# Patient Record
Sex: Female | Born: 1970 | Race: White | Hispanic: No | Smoking: Former smoker
Health system: Southern US, Community
[De-identification: ages and names within clinical notes are randomized; demographics above are authoritative.]

## PROBLEM LIST (undated history)

## (undated) DIAGNOSIS — M549 Dorsalgia, unspecified: Secondary | ICD-10-CM

## (undated) DIAGNOSIS — F32A Depression, unspecified: Secondary | ICD-10-CM

## (undated) DIAGNOSIS — G43909 Migraine, unspecified, not intractable, without status migrainosus: Secondary | ICD-10-CM

## (undated) DIAGNOSIS — F419 Anxiety disorder, unspecified: Secondary | ICD-10-CM

## (undated) DIAGNOSIS — F1011 Alcohol abuse, in remission: Secondary | ICD-10-CM

## (undated) DIAGNOSIS — E785 Hyperlipidemia, unspecified: Secondary | ICD-10-CM

## (undated) DIAGNOSIS — Z87442 Personal history of urinary calculi: Secondary | ICD-10-CM

## (undated) DIAGNOSIS — F1910 Other psychoactive substance abuse, uncomplicated: Secondary | ICD-10-CM

## (undated) DIAGNOSIS — Z8719 Personal history of other diseases of the digestive system: Secondary | ICD-10-CM

## (undated) DIAGNOSIS — T4145XA Adverse effect of unspecified anesthetic, initial encounter: Secondary | ICD-10-CM

## (undated) DIAGNOSIS — K589 Irritable bowel syndrome without diarrhea: Secondary | ICD-10-CM

## (undated) DIAGNOSIS — S83249A Other tear of medial meniscus, current injury, unspecified knee, initial encounter: Secondary | ICD-10-CM

## (undated) DIAGNOSIS — Z9889 Other specified postprocedural states: Secondary | ICD-10-CM

## (undated) DIAGNOSIS — Z98811 Dental restoration status: Secondary | ICD-10-CM

## (undated) DIAGNOSIS — M1990 Unspecified osteoarthritis, unspecified site: Secondary | ICD-10-CM

## (undated) DIAGNOSIS — F1411 Cocaine abuse, in remission: Secondary | ICD-10-CM

## (undated) DIAGNOSIS — G4730 Sleep apnea, unspecified: Secondary | ICD-10-CM

## (undated) DIAGNOSIS — K219 Gastro-esophageal reflux disease without esophagitis: Secondary | ICD-10-CM

## (undated) DIAGNOSIS — M5020 Other cervical disc displacement, unspecified cervical region: Secondary | ICD-10-CM

## (undated) DIAGNOSIS — M5412 Radiculopathy, cervical region: Secondary | ICD-10-CM

## (undated) DIAGNOSIS — A6000 Herpesviral infection of urogenital system, unspecified: Secondary | ICD-10-CM

## (undated) DIAGNOSIS — I10 Essential (primary) hypertension: Secondary | ICD-10-CM

## (undated) DIAGNOSIS — F329 Major depressive disorder, single episode, unspecified: Secondary | ICD-10-CM

## (undated) DIAGNOSIS — S161XXA Strain of muscle, fascia and tendon at neck level, initial encounter: Secondary | ICD-10-CM

## (undated) DIAGNOSIS — G8929 Other chronic pain: Secondary | ICD-10-CM

## (undated) DIAGNOSIS — G5600 Carpal tunnel syndrome, unspecified upper limb: Secondary | ICD-10-CM

## (undated) DIAGNOSIS — Z765 Malingerer [conscious simulation]: Secondary | ICD-10-CM

## (undated) HISTORY — DX: Malingerer (conscious simulation): Z76.5

## (undated) HISTORY — PX: APPENDECTOMY: SHX54

## (undated) HISTORY — PX: ERCP: SHX60

## (undated) HISTORY — DX: Carpal tunnel syndrome, unspecified upper limb: G56.00

## (undated) HISTORY — DX: Major depressive disorder, single episode, unspecified: F32.9

## (undated) HISTORY — DX: Irritable bowel syndrome, unspecified: K58.9

## (undated) HISTORY — DX: Anxiety disorder, unspecified: F41.9

## (undated) HISTORY — DX: Alcohol abuse, in remission: F10.11

## (undated) HISTORY — DX: Depression, unspecified: F32.A

## (undated) HISTORY — DX: Cocaine abuse, in remission: F14.11

## (undated) HISTORY — PX: NASAL SEPTUM SURGERY: SHX37

## (undated) HISTORY — DX: Migraine, unspecified, not intractable, without status migrainosus: G43.909

## (undated) HISTORY — DX: Hyperlipidemia, unspecified: E78.5

## (undated) HISTORY — PX: COLONOSCOPY: SHX174

## (undated) HISTORY — DX: Essential (primary) hypertension: I10

## (undated) HISTORY — PX: UPPER GASTROINTESTINAL ENDOSCOPY: SHX188

## (undated) HISTORY — PX: KNEE ARTHROSCOPY: SUR90

## (undated) HISTORY — DX: Dorsalgia, unspecified: M54.9

## (undated) HISTORY — DX: Other psychoactive substance abuse, uncomplicated: F19.10

## (undated) HISTORY — DX: Radiculopathy, cervical region: M54.12

## (undated) HISTORY — PX: ABDOMINAL HYSTERECTOMY: SHX81

## (undated) HISTORY — DX: Other chronic pain: G89.29

## (undated) HISTORY — DX: Gastro-esophageal reflux disease without esophagitis: K21.9

---

## 1998-04-20 ENCOUNTER — Ambulatory Visit (HOSPITAL_COMMUNITY): Admission: RE | Admit: 1998-04-20 | Discharge: 1998-04-20 | Payer: Self-pay | Admitting: *Deleted

## 1999-05-24 ENCOUNTER — Other Ambulatory Visit: Admission: RE | Admit: 1999-05-24 | Discharge: 1999-05-24 | Payer: Self-pay | Admitting: Gynecology

## 1999-05-26 ENCOUNTER — Encounter (INDEPENDENT_AMBULATORY_CARE_PROVIDER_SITE_OTHER): Payer: Self-pay | Admitting: Specialist

## 1999-05-26 ENCOUNTER — Inpatient Hospital Stay (HOSPITAL_COMMUNITY): Admission: RE | Admit: 1999-05-26 | Discharge: 1999-05-27 | Payer: Self-pay | Admitting: Gynecology

## 2000-09-02 ENCOUNTER — Emergency Department (HOSPITAL_COMMUNITY): Admission: EM | Admit: 2000-09-02 | Discharge: 2000-09-02 | Payer: Self-pay | Admitting: Emergency Medicine

## 2001-05-01 ENCOUNTER — Ambulatory Visit (HOSPITAL_COMMUNITY): Admission: RE | Admit: 2001-05-01 | Discharge: 2001-05-01 | Payer: Self-pay | Admitting: Family Medicine

## 2001-05-01 ENCOUNTER — Encounter: Payer: Self-pay | Admitting: Family Medicine

## 2002-08-04 ENCOUNTER — Emergency Department (HOSPITAL_COMMUNITY): Admission: EM | Admit: 2002-08-04 | Discharge: 2002-08-04 | Payer: Self-pay | Admitting: Emergency Medicine

## 2002-08-25 ENCOUNTER — Emergency Department (HOSPITAL_COMMUNITY): Admission: EM | Admit: 2002-08-25 | Discharge: 2002-08-25 | Payer: Self-pay | Admitting: *Deleted

## 2002-12-22 ENCOUNTER — Emergency Department (HOSPITAL_COMMUNITY): Admission: EM | Admit: 2002-12-22 | Discharge: 2002-12-22 | Payer: Self-pay | Admitting: Emergency Medicine

## 2002-12-22 ENCOUNTER — Encounter: Payer: Self-pay | Admitting: Emergency Medicine

## 2003-02-02 ENCOUNTER — Emergency Department (HOSPITAL_COMMUNITY): Admission: EM | Admit: 2003-02-02 | Discharge: 2003-02-02 | Payer: Self-pay | Admitting: Emergency Medicine

## 2003-02-12 ENCOUNTER — Emergency Department (HOSPITAL_COMMUNITY): Admission: EM | Admit: 2003-02-12 | Discharge: 2003-02-12 | Payer: Self-pay | Admitting: Emergency Medicine

## 2003-02-20 ENCOUNTER — Emergency Department (HOSPITAL_COMMUNITY): Admission: EM | Admit: 2003-02-20 | Discharge: 2003-02-20 | Payer: Self-pay | Admitting: *Deleted

## 2003-03-03 ENCOUNTER — Emergency Department (HOSPITAL_COMMUNITY): Admission: EM | Admit: 2003-03-03 | Discharge: 2003-03-03 | Payer: Self-pay | Admitting: Emergency Medicine

## 2003-03-05 ENCOUNTER — Emergency Department (HOSPITAL_COMMUNITY): Admission: EM | Admit: 2003-03-05 | Discharge: 2003-03-05 | Payer: Self-pay | Admitting: Emergency Medicine

## 2003-08-10 ENCOUNTER — Emergency Department (HOSPITAL_COMMUNITY): Admission: EM | Admit: 2003-08-10 | Discharge: 2003-08-10 | Payer: Self-pay | Admitting: Emergency Medicine

## 2003-09-05 ENCOUNTER — Emergency Department (HOSPITAL_COMMUNITY): Admission: EM | Admit: 2003-09-05 | Discharge: 2003-09-06 | Payer: Self-pay | Admitting: Emergency Medicine

## 2003-09-20 ENCOUNTER — Emergency Department (HOSPITAL_COMMUNITY): Admission: EM | Admit: 2003-09-20 | Discharge: 2003-09-20 | Payer: Self-pay | Admitting: Emergency Medicine

## 2003-10-04 ENCOUNTER — Emergency Department (HOSPITAL_COMMUNITY): Admission: EM | Admit: 2003-10-04 | Discharge: 2003-10-04 | Payer: Self-pay | Admitting: Emergency Medicine

## 2003-10-20 ENCOUNTER — Emergency Department (HOSPITAL_COMMUNITY): Admission: EM | Admit: 2003-10-20 | Discharge: 2003-10-21 | Payer: Self-pay | Admitting: Emergency Medicine

## 2003-12-25 ENCOUNTER — Emergency Department (HOSPITAL_COMMUNITY): Admission: EM | Admit: 2003-12-25 | Discharge: 2003-12-25 | Payer: Self-pay | Admitting: Emergency Medicine

## 2004-02-07 ENCOUNTER — Emergency Department (HOSPITAL_COMMUNITY): Admission: EM | Admit: 2004-02-07 | Discharge: 2004-02-07 | Payer: Self-pay | Admitting: Emergency Medicine

## 2004-03-23 ENCOUNTER — Emergency Department (HOSPITAL_COMMUNITY): Admission: EM | Admit: 2004-03-23 | Discharge: 2004-03-23 | Payer: Self-pay | Admitting: *Deleted

## 2004-03-25 ENCOUNTER — Emergency Department (HOSPITAL_COMMUNITY): Admission: EM | Admit: 2004-03-25 | Discharge: 2004-03-25 | Payer: Self-pay | Admitting: Emergency Medicine

## 2004-04-28 ENCOUNTER — Emergency Department (HOSPITAL_COMMUNITY): Admission: EM | Admit: 2004-04-28 | Discharge: 2004-04-28 | Payer: Self-pay | Admitting: Emergency Medicine

## 2004-05-26 ENCOUNTER — Emergency Department (HOSPITAL_COMMUNITY): Admission: EM | Admit: 2004-05-26 | Discharge: 2004-05-27 | Payer: Self-pay | Admitting: Emergency Medicine

## 2004-06-06 ENCOUNTER — Emergency Department (HOSPITAL_COMMUNITY): Admission: EM | Admit: 2004-06-06 | Discharge: 2004-06-06 | Payer: Self-pay | Admitting: Pediatrics

## 2004-06-19 ENCOUNTER — Emergency Department (HOSPITAL_COMMUNITY): Admission: EM | Admit: 2004-06-19 | Discharge: 2004-06-20 | Payer: Self-pay | Admitting: Emergency Medicine

## 2004-07-27 ENCOUNTER — Emergency Department (HOSPITAL_COMMUNITY): Admission: EM | Admit: 2004-07-27 | Discharge: 2004-07-27 | Payer: Self-pay | Admitting: Emergency Medicine

## 2004-07-30 ENCOUNTER — Emergency Department (HOSPITAL_COMMUNITY): Admission: EM | Admit: 2004-07-30 | Discharge: 2004-07-31 | Payer: Self-pay | Admitting: Emergency Medicine

## 2004-08-15 ENCOUNTER — Emergency Department (HOSPITAL_COMMUNITY): Admission: EM | Admit: 2004-08-15 | Discharge: 2004-08-15 | Payer: Self-pay | Admitting: Emergency Medicine

## 2004-12-19 ENCOUNTER — Emergency Department (HOSPITAL_COMMUNITY): Admission: EM | Admit: 2004-12-19 | Discharge: 2004-12-19 | Payer: Self-pay | Admitting: Emergency Medicine

## 2004-12-20 ENCOUNTER — Emergency Department (HOSPITAL_COMMUNITY): Admission: EM | Admit: 2004-12-20 | Discharge: 2004-12-20 | Payer: Self-pay | Admitting: *Deleted

## 2005-01-06 ENCOUNTER — Emergency Department (HOSPITAL_COMMUNITY): Admission: EM | Admit: 2005-01-06 | Discharge: 2005-01-07 | Payer: Self-pay | Admitting: Emergency Medicine

## 2005-01-27 ENCOUNTER — Emergency Department (HOSPITAL_COMMUNITY): Admission: EM | Admit: 2005-01-27 | Discharge: 2005-01-28 | Payer: Self-pay | Admitting: Emergency Medicine

## 2005-02-09 ENCOUNTER — Emergency Department (HOSPITAL_COMMUNITY): Admission: EM | Admit: 2005-02-09 | Discharge: 2005-02-09 | Payer: Self-pay | Admitting: Emergency Medicine

## 2005-04-03 ENCOUNTER — Emergency Department (HOSPITAL_COMMUNITY): Admission: EM | Admit: 2005-04-03 | Discharge: 2005-04-03 | Payer: Self-pay | Admitting: Emergency Medicine

## 2005-04-04 ENCOUNTER — Ambulatory Visit: Payer: Self-pay | Admitting: Gastroenterology

## 2005-04-05 ENCOUNTER — Ambulatory Visit: Payer: Self-pay | Admitting: Gastroenterology

## 2005-04-07 ENCOUNTER — Ambulatory Visit (HOSPITAL_COMMUNITY): Admission: RE | Admit: 2005-04-07 | Discharge: 2005-04-07 | Payer: Self-pay | Admitting: Gastroenterology

## 2005-04-18 ENCOUNTER — Ambulatory Visit (HOSPITAL_COMMUNITY): Admission: RE | Admit: 2005-04-18 | Discharge: 2005-04-18 | Payer: Self-pay | Admitting: Gastroenterology

## 2005-04-29 ENCOUNTER — Emergency Department (HOSPITAL_COMMUNITY): Admission: EM | Admit: 2005-04-29 | Discharge: 2005-04-29 | Payer: Self-pay | Admitting: Emergency Medicine

## 2005-05-22 ENCOUNTER — Emergency Department (HOSPITAL_COMMUNITY): Admission: EM | Admit: 2005-05-22 | Discharge: 2005-05-22 | Payer: Self-pay | Admitting: Emergency Medicine

## 2005-05-29 ENCOUNTER — Emergency Department (HOSPITAL_COMMUNITY): Admission: EM | Admit: 2005-05-29 | Discharge: 2005-05-30 | Payer: Self-pay | Admitting: Emergency Medicine

## 2005-06-08 ENCOUNTER — Emergency Department (HOSPITAL_COMMUNITY): Admission: EM | Admit: 2005-06-08 | Discharge: 2005-06-09 | Payer: Self-pay | Admitting: Emergency Medicine

## 2005-07-15 ENCOUNTER — Inpatient Hospital Stay (HOSPITAL_COMMUNITY): Admission: EM | Admit: 2005-07-15 | Discharge: 2005-07-19 | Payer: Self-pay | Admitting: Psychiatry

## 2005-07-16 ENCOUNTER — Ambulatory Visit: Payer: Self-pay | Admitting: Psychiatry

## 2005-07-30 ENCOUNTER — Emergency Department (HOSPITAL_COMMUNITY): Admission: EM | Admit: 2005-07-30 | Discharge: 2005-07-30 | Payer: Self-pay | Admitting: Emergency Medicine

## 2005-08-12 ENCOUNTER — Emergency Department (HOSPITAL_COMMUNITY): Admission: EM | Admit: 2005-08-12 | Discharge: 2005-08-12 | Payer: Self-pay | Admitting: Emergency Medicine

## 2005-08-15 ENCOUNTER — Encounter (HOSPITAL_COMMUNITY): Admission: RE | Admit: 2005-08-15 | Discharge: 2005-11-13 | Payer: Self-pay | Admitting: Emergency Medicine

## 2005-08-19 ENCOUNTER — Emergency Department (HOSPITAL_COMMUNITY): Admission: EM | Admit: 2005-08-19 | Discharge: 2005-08-19 | Payer: Self-pay | Admitting: Emergency Medicine

## 2005-08-24 ENCOUNTER — Emergency Department (HOSPITAL_COMMUNITY): Admission: EM | Admit: 2005-08-24 | Discharge: 2005-08-25 | Payer: Self-pay | Admitting: Emergency Medicine

## 2005-08-26 ENCOUNTER — Emergency Department (HOSPITAL_COMMUNITY): Admission: EM | Admit: 2005-08-26 | Discharge: 2005-08-26 | Payer: Self-pay | Admitting: *Deleted

## 2005-09-01 ENCOUNTER — Inpatient Hospital Stay (HOSPITAL_COMMUNITY): Admission: EM | Admit: 2005-09-01 | Discharge: 2005-09-06 | Payer: Self-pay

## 2005-09-01 ENCOUNTER — Ambulatory Visit: Payer: Self-pay | Admitting: Family Medicine

## 2005-09-02 ENCOUNTER — Ambulatory Visit: Payer: Self-pay | Admitting: Psychiatry

## 2005-09-06 ENCOUNTER — Ambulatory Visit: Payer: Self-pay | Admitting: Family Medicine

## 2005-09-06 ENCOUNTER — Observation Stay (HOSPITAL_COMMUNITY): Admission: AD | Admit: 2005-09-06 | Discharge: 2005-09-07 | Payer: Self-pay | Admitting: Family Medicine

## 2005-09-12 ENCOUNTER — Emergency Department (HOSPITAL_COMMUNITY): Admission: EM | Admit: 2005-09-12 | Discharge: 2005-09-13 | Payer: Self-pay | Admitting: Emergency Medicine

## 2005-09-17 ENCOUNTER — Emergency Department (HOSPITAL_COMMUNITY): Admission: EM | Admit: 2005-09-17 | Discharge: 2005-09-17 | Payer: Self-pay | Admitting: Emergency Medicine

## 2005-09-18 ENCOUNTER — Emergency Department (HOSPITAL_COMMUNITY): Admission: EM | Admit: 2005-09-18 | Discharge: 2005-09-18 | Payer: Self-pay | Admitting: Emergency Medicine

## 2005-09-27 ENCOUNTER — Emergency Department (HOSPITAL_COMMUNITY): Admission: EM | Admit: 2005-09-27 | Discharge: 2005-09-27 | Payer: Self-pay | Admitting: Emergency Medicine

## 2005-10-03 ENCOUNTER — Emergency Department (HOSPITAL_COMMUNITY): Admission: EM | Admit: 2005-10-03 | Discharge: 2005-10-03 | Payer: Self-pay | Admitting: Emergency Medicine

## 2005-10-15 ENCOUNTER — Emergency Department (HOSPITAL_COMMUNITY): Admission: EM | Admit: 2005-10-15 | Discharge: 2005-10-15 | Payer: Self-pay | Admitting: Emergency Medicine

## 2005-10-20 ENCOUNTER — Inpatient Hospital Stay (HOSPITAL_COMMUNITY): Admission: AD | Admit: 2005-10-20 | Discharge: 2005-10-24 | Payer: Self-pay | Admitting: Psychiatry

## 2005-10-21 ENCOUNTER — Ambulatory Visit: Payer: Self-pay | Admitting: Psychiatry

## 2005-10-28 ENCOUNTER — Emergency Department (HOSPITAL_COMMUNITY): Admission: EM | Admit: 2005-10-28 | Discharge: 2005-10-28 | Payer: Self-pay | Admitting: *Deleted

## 2005-11-03 ENCOUNTER — Inpatient Hospital Stay (HOSPITAL_COMMUNITY): Admission: RE | Admit: 2005-11-03 | Discharge: 2005-11-09 | Payer: Self-pay | Admitting: Psychiatry

## 2005-11-06 ENCOUNTER — Ambulatory Visit: Payer: Self-pay | Admitting: Psychiatry

## 2005-11-16 ENCOUNTER — Ambulatory Visit: Payer: Self-pay | Admitting: Gastroenterology

## 2005-11-17 ENCOUNTER — Emergency Department (HOSPITAL_COMMUNITY): Admission: EM | Admit: 2005-11-17 | Discharge: 2005-11-17 | Payer: Self-pay | Admitting: Emergency Medicine

## 2005-11-18 ENCOUNTER — Emergency Department (HOSPITAL_COMMUNITY): Admission: EM | Admit: 2005-11-18 | Discharge: 2005-11-18 | Payer: Self-pay | Admitting: Family Medicine

## 2005-11-18 ENCOUNTER — Emergency Department (HOSPITAL_COMMUNITY): Admission: EM | Admit: 2005-11-18 | Discharge: 2005-11-19 | Payer: Self-pay | Admitting: Emergency Medicine

## 2005-11-22 ENCOUNTER — Ambulatory Visit: Payer: Self-pay | Admitting: Gastroenterology

## 2005-11-27 ENCOUNTER — Ambulatory Visit: Payer: Self-pay | Admitting: Gastroenterology

## 2005-11-28 ENCOUNTER — Encounter: Payer: Self-pay | Admitting: Emergency Medicine

## 2005-11-29 ENCOUNTER — Emergency Department (HOSPITAL_COMMUNITY): Admission: EM | Admit: 2005-11-29 | Discharge: 2005-11-29 | Payer: Self-pay | Admitting: Emergency Medicine

## 2005-12-22 ENCOUNTER — Ambulatory Visit: Payer: Self-pay | Admitting: Gastroenterology

## 2006-01-16 ENCOUNTER — Emergency Department (HOSPITAL_COMMUNITY): Admission: EM | Admit: 2006-01-16 | Discharge: 2006-01-16 | Payer: Self-pay | Admitting: Internal Medicine

## 2006-01-23 ENCOUNTER — Ambulatory Visit: Payer: Self-pay | Admitting: Gastroenterology

## 2006-01-24 ENCOUNTER — Ambulatory Visit: Payer: Self-pay | Admitting: Cardiology

## 2006-02-01 ENCOUNTER — Ambulatory Visit: Payer: Self-pay | Admitting: Gastroenterology

## 2006-02-01 ENCOUNTER — Ambulatory Visit (HOSPITAL_COMMUNITY): Admission: RE | Admit: 2006-02-01 | Discharge: 2006-02-01 | Payer: Self-pay | Admitting: Surgery

## 2006-02-15 ENCOUNTER — Emergency Department (HOSPITAL_COMMUNITY): Admission: EM | Admit: 2006-02-15 | Discharge: 2006-02-15 | Payer: Self-pay | Admitting: Emergency Medicine

## 2006-02-21 ENCOUNTER — Ambulatory Visit (HOSPITAL_COMMUNITY): Admission: RE | Admit: 2006-02-21 | Discharge: 2006-02-22 | Payer: Self-pay | Admitting: Surgery

## 2006-02-21 ENCOUNTER — Encounter (INDEPENDENT_AMBULATORY_CARE_PROVIDER_SITE_OTHER): Payer: Self-pay | Admitting: Specialist

## 2006-02-21 HISTORY — PX: LAPAROSCOPIC LYSIS OF ADHESIONS: SHX5905

## 2006-02-21 HISTORY — PX: LAPAROSCOPIC APPENDECTOMY: SHX408

## 2006-02-25 ENCOUNTER — Emergency Department (HOSPITAL_COMMUNITY): Admission: EM | Admit: 2006-02-25 | Discharge: 2006-02-25 | Payer: Self-pay | Admitting: Emergency Medicine

## 2006-03-06 ENCOUNTER — Emergency Department (HOSPITAL_COMMUNITY): Admission: EM | Admit: 2006-03-06 | Discharge: 2006-03-06 | Payer: Self-pay | Admitting: Emergency Medicine

## 2006-03-10 ENCOUNTER — Emergency Department (HOSPITAL_COMMUNITY): Admission: EM | Admit: 2006-03-10 | Discharge: 2006-03-10 | Payer: Self-pay | Admitting: Family Medicine

## 2006-03-16 ENCOUNTER — Emergency Department (HOSPITAL_COMMUNITY): Admission: EM | Admit: 2006-03-16 | Discharge: 2006-03-17 | Payer: Self-pay | Admitting: Emergency Medicine

## 2006-03-27 ENCOUNTER — Emergency Department (HOSPITAL_COMMUNITY): Admission: EM | Admit: 2006-03-27 | Discharge: 2006-03-27 | Payer: Self-pay | Admitting: Emergency Medicine

## 2006-04-07 ENCOUNTER — Emergency Department (HOSPITAL_COMMUNITY): Admission: EM | Admit: 2006-04-07 | Discharge: 2006-04-07 | Payer: Self-pay | Admitting: Emergency Medicine

## 2006-04-28 ENCOUNTER — Emergency Department (HOSPITAL_COMMUNITY): Admission: EM | Admit: 2006-04-28 | Discharge: 2006-04-28 | Payer: Self-pay | Admitting: Emergency Medicine

## 2006-04-29 ENCOUNTER — Emergency Department (HOSPITAL_COMMUNITY): Admission: EM | Admit: 2006-04-29 | Discharge: 2006-04-29 | Payer: Self-pay | Admitting: Emergency Medicine

## 2006-05-09 ENCOUNTER — Emergency Department (HOSPITAL_COMMUNITY): Admission: EM | Admit: 2006-05-09 | Discharge: 2006-05-09 | Payer: Self-pay | Admitting: Emergency Medicine

## 2006-05-11 ENCOUNTER — Inpatient Hospital Stay (HOSPITAL_COMMUNITY): Admission: RE | Admit: 2006-05-11 | Discharge: 2006-05-17 | Payer: Self-pay | Admitting: Psychiatry

## 2006-05-17 ENCOUNTER — Inpatient Hospital Stay (HOSPITAL_COMMUNITY): Admission: EM | Admit: 2006-05-17 | Discharge: 2006-05-20 | Payer: Self-pay | Admitting: Psychiatry

## 2006-05-18 ENCOUNTER — Ambulatory Visit: Payer: Self-pay | Admitting: Psychiatry

## 2006-05-19 ENCOUNTER — Encounter (HOSPITAL_COMMUNITY): Payer: Self-pay | Admitting: Psychiatry

## 2006-05-28 ENCOUNTER — Inpatient Hospital Stay (HOSPITAL_COMMUNITY): Admission: RE | Admit: 2006-05-28 | Discharge: 2006-06-02 | Payer: Self-pay | Admitting: *Deleted

## 2007-01-03 ENCOUNTER — Emergency Department (HOSPITAL_COMMUNITY): Admission: EM | Admit: 2007-01-03 | Discharge: 2007-01-03 | Payer: Self-pay | Admitting: Emergency Medicine

## 2007-01-24 ENCOUNTER — Ambulatory Visit (HOSPITAL_COMMUNITY): Admission: RE | Admit: 2007-01-24 | Discharge: 2007-01-24 | Payer: Self-pay | Admitting: Gastroenterology

## 2007-01-27 ENCOUNTER — Emergency Department (HOSPITAL_COMMUNITY): Admission: EM | Admit: 2007-01-27 | Discharge: 2007-01-27 | Payer: Self-pay | Admitting: *Deleted

## 2007-01-29 ENCOUNTER — Emergency Department (HOSPITAL_COMMUNITY): Admission: EM | Admit: 2007-01-29 | Discharge: 2007-01-29 | Payer: Self-pay | Admitting: Emergency Medicine

## 2007-02-05 ENCOUNTER — Emergency Department (HOSPITAL_COMMUNITY): Admission: EM | Admit: 2007-02-05 | Discharge: 2007-02-05 | Payer: Self-pay | Admitting: Emergency Medicine

## 2007-02-06 ENCOUNTER — Ambulatory Visit: Payer: Self-pay | Admitting: Gastroenterology

## 2007-02-13 ENCOUNTER — Emergency Department (HOSPITAL_COMMUNITY): Admission: EM | Admit: 2007-02-13 | Discharge: 2007-02-13 | Payer: Self-pay | Admitting: Emergency Medicine

## 2007-03-26 ENCOUNTER — Emergency Department (HOSPITAL_COMMUNITY): Admission: EM | Admit: 2007-03-26 | Discharge: 2007-03-26 | Payer: Self-pay | Admitting: Emergency Medicine

## 2007-04-05 ENCOUNTER — Emergency Department (HOSPITAL_COMMUNITY): Admission: EM | Admit: 2007-04-05 | Discharge: 2007-04-05 | Payer: Self-pay | Admitting: Emergency Medicine

## 2007-04-11 ENCOUNTER — Emergency Department (HOSPITAL_COMMUNITY): Admission: EM | Admit: 2007-04-11 | Discharge: 2007-04-11 | Payer: Self-pay | Admitting: Emergency Medicine

## 2007-05-07 ENCOUNTER — Emergency Department (HOSPITAL_COMMUNITY): Admission: EM | Admit: 2007-05-07 | Discharge: 2007-05-07 | Payer: Self-pay | Admitting: Emergency Medicine

## 2007-06-11 ENCOUNTER — Ambulatory Visit (HOSPITAL_COMMUNITY): Admission: RE | Admit: 2007-06-11 | Discharge: 2007-06-11 | Payer: Self-pay | Admitting: Urology

## 2007-06-11 HISTORY — PX: CYSTOSCOPY W/ URETERAL STENT PLACEMENT: SHX1429

## 2007-06-12 ENCOUNTER — Emergency Department (HOSPITAL_COMMUNITY): Admission: EM | Admit: 2007-06-12 | Discharge: 2007-06-12 | Payer: Self-pay | Admitting: Emergency Medicine

## 2007-09-29 ENCOUNTER — Emergency Department: Payer: Self-pay | Admitting: Emergency Medicine

## 2007-10-17 ENCOUNTER — Emergency Department: Payer: Self-pay | Admitting: Emergency Medicine

## 2007-10-17 ENCOUNTER — Other Ambulatory Visit: Payer: Self-pay

## 2007-10-27 ENCOUNTER — Emergency Department: Payer: Self-pay | Admitting: Emergency Medicine

## 2007-12-21 ENCOUNTER — Emergency Department: Payer: Self-pay | Admitting: Emergency Medicine

## 2007-12-26 ENCOUNTER — Emergency Department: Payer: Self-pay | Admitting: Emergency Medicine

## 2008-01-15 ENCOUNTER — Emergency Department: Payer: Self-pay | Admitting: Unknown Physician Specialty

## 2008-01-31 ENCOUNTER — Emergency Department: Payer: Self-pay | Admitting: Emergency Medicine

## 2008-02-10 ENCOUNTER — Emergency Department: Payer: Self-pay | Admitting: Emergency Medicine

## 2008-02-13 ENCOUNTER — Emergency Department: Payer: Self-pay | Admitting: Emergency Medicine

## 2008-04-15 ENCOUNTER — Emergency Department: Payer: Self-pay | Admitting: Emergency Medicine

## 2008-05-24 ENCOUNTER — Emergency Department: Payer: Self-pay | Admitting: Emergency Medicine

## 2008-07-31 ENCOUNTER — Emergency Department: Payer: Self-pay | Admitting: Internal Medicine

## 2008-08-14 ENCOUNTER — Emergency Department: Payer: Self-pay | Admitting: Emergency Medicine

## 2008-08-22 ENCOUNTER — Encounter: Admission: RE | Admit: 2008-08-22 | Discharge: 2008-08-22 | Payer: Self-pay | Admitting: Orthopedic Surgery

## 2008-09-20 ENCOUNTER — Emergency Department: Payer: Self-pay | Admitting: Emergency Medicine

## 2008-11-20 ENCOUNTER — Emergency Department: Payer: Self-pay | Admitting: Emergency Medicine

## 2009-02-09 ENCOUNTER — Emergency Department (HOSPITAL_COMMUNITY): Admission: EM | Admit: 2009-02-09 | Discharge: 2009-02-09 | Payer: Self-pay | Admitting: Emergency Medicine

## 2009-03-03 ENCOUNTER — Emergency Department (HOSPITAL_COMMUNITY): Admission: EM | Admit: 2009-03-03 | Discharge: 2009-03-03 | Payer: Self-pay | Admitting: Emergency Medicine

## 2009-03-29 ENCOUNTER — Emergency Department (HOSPITAL_COMMUNITY): Admission: EM | Admit: 2009-03-29 | Discharge: 2009-03-29 | Payer: Self-pay | Admitting: Emergency Medicine

## 2009-03-30 ENCOUNTER — Emergency Department: Payer: Self-pay | Admitting: Emergency Medicine

## 2009-04-11 ENCOUNTER — Emergency Department (HOSPITAL_COMMUNITY): Admission: EM | Admit: 2009-04-11 | Discharge: 2009-04-11 | Payer: Self-pay | Admitting: Emergency Medicine

## 2009-04-13 ENCOUNTER — Emergency Department (HOSPITAL_BASED_OUTPATIENT_CLINIC_OR_DEPARTMENT_OTHER): Admission: EM | Admit: 2009-04-13 | Discharge: 2009-04-13 | Payer: Self-pay | Admitting: Emergency Medicine

## 2009-04-18 ENCOUNTER — Emergency Department (HOSPITAL_COMMUNITY): Admission: EM | Admit: 2009-04-18 | Discharge: 2009-04-19 | Payer: Self-pay | Admitting: Emergency Medicine

## 2009-04-20 ENCOUNTER — Emergency Department (HOSPITAL_COMMUNITY): Admission: EM | Admit: 2009-04-20 | Discharge: 2009-04-20 | Payer: Self-pay | Admitting: Emergency Medicine

## 2009-04-20 ENCOUNTER — Emergency Department (HOSPITAL_COMMUNITY): Admission: EM | Admit: 2009-04-20 | Discharge: 2009-04-20 | Payer: Self-pay | Admitting: Family Medicine

## 2009-05-06 ENCOUNTER — Emergency Department: Payer: Self-pay | Admitting: Emergency Medicine

## 2009-05-29 ENCOUNTER — Emergency Department: Payer: Self-pay | Admitting: Emergency Medicine

## 2009-06-02 ENCOUNTER — Ambulatory Visit: Payer: Self-pay | Admitting: Gastroenterology

## 2009-06-04 ENCOUNTER — Emergency Department (HOSPITAL_COMMUNITY): Admission: EM | Admit: 2009-06-04 | Discharge: 2009-06-05 | Payer: Self-pay | Admitting: Emergency Medicine

## 2009-06-09 ENCOUNTER — Emergency Department: Payer: Self-pay | Admitting: Unknown Physician Specialty

## 2009-07-26 ENCOUNTER — Ambulatory Visit (HOSPITAL_COMMUNITY): Admission: RE | Admit: 2009-07-26 | Discharge: 2009-07-27 | Payer: Self-pay | Admitting: Otolaryngology

## 2009-07-26 HISTORY — PX: NASAL SEPTOPLASTY W/ TURBINOPLASTY: SHX2070

## 2009-08-04 ENCOUNTER — Emergency Department (HOSPITAL_COMMUNITY): Admission: EM | Admit: 2009-08-04 | Discharge: 2009-08-05 | Payer: Self-pay | Admitting: Emergency Medicine

## 2009-08-04 ENCOUNTER — Emergency Department (HOSPITAL_COMMUNITY): Admission: EM | Admit: 2009-08-04 | Discharge: 2009-08-04 | Payer: Self-pay | Admitting: Family Medicine

## 2009-09-12 ENCOUNTER — Emergency Department (HOSPITAL_COMMUNITY): Admission: EM | Admit: 2009-09-12 | Discharge: 2009-09-12 | Payer: Self-pay | Admitting: Emergency Medicine

## 2009-09-23 ENCOUNTER — Emergency Department (HOSPITAL_COMMUNITY): Admission: EM | Admit: 2009-09-23 | Discharge: 2009-09-24 | Payer: Self-pay | Admitting: Emergency Medicine

## 2009-10-05 ENCOUNTER — Emergency Department: Payer: Self-pay | Admitting: Internal Medicine

## 2009-10-26 IMAGING — US US PELV - US TRANSVAGINAL
1 series · 17 of 25 positions shown · non-contrast
Comparison: none

REASON FOR EXAM: do not bneed transvaginmal;
COMMENTS:

[Series 1: us pelv - us transvaginal · 17 of 73 slices shown]
[im 1/73]
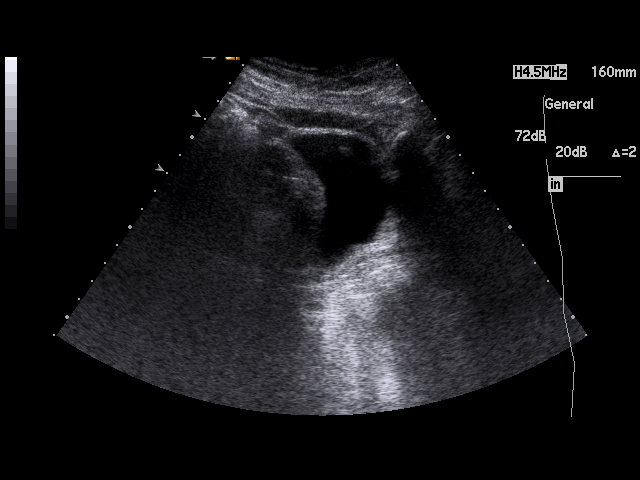
[im 7/73]
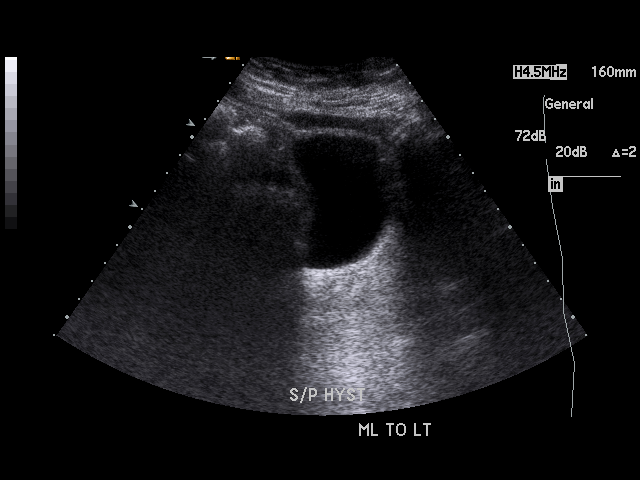
[im 10/73]
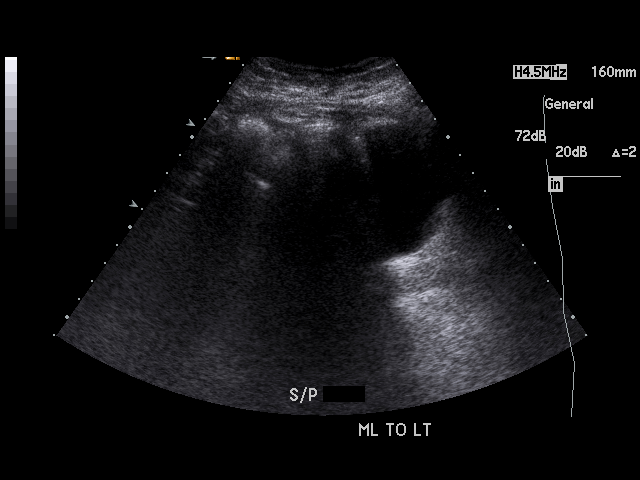
[im 16/73]
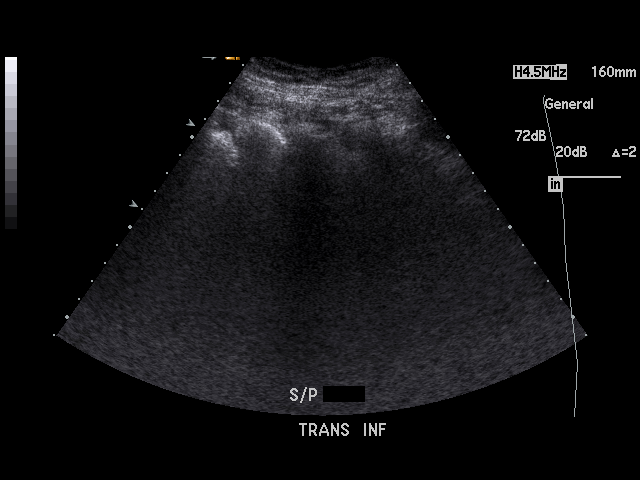
[im 19/73]
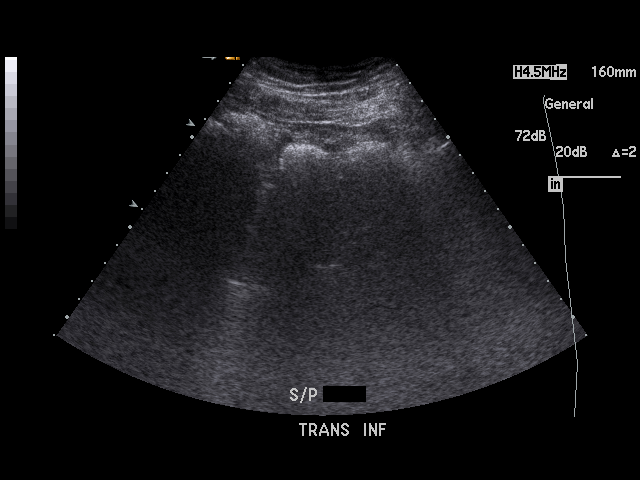
[im 25/73]
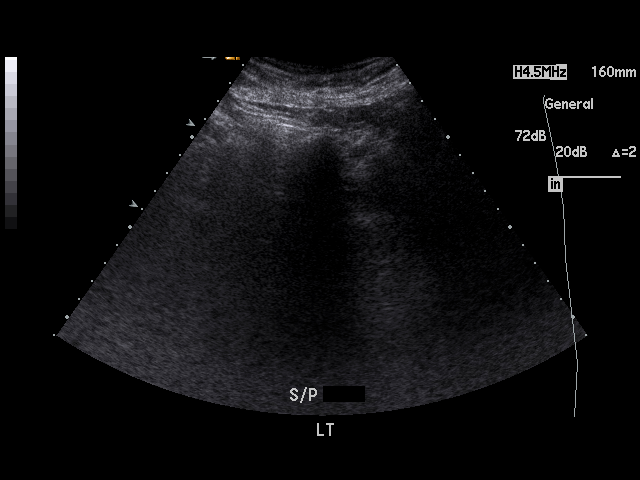
[im 28/73]
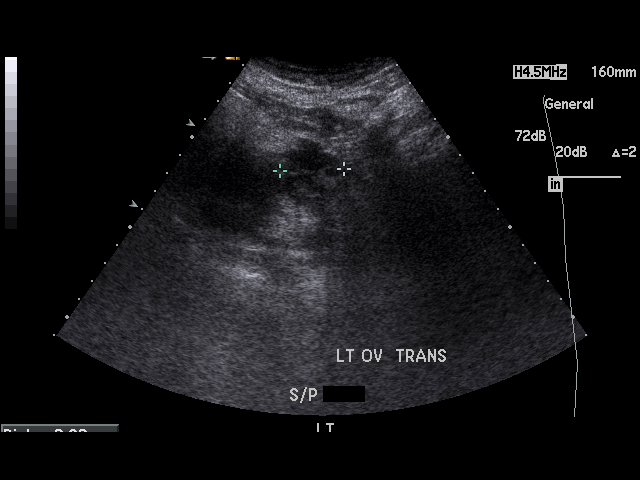
[im 34/73]
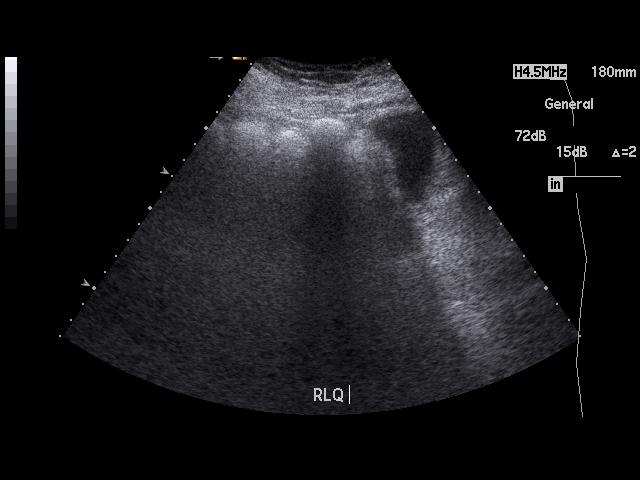
[im 37/73]
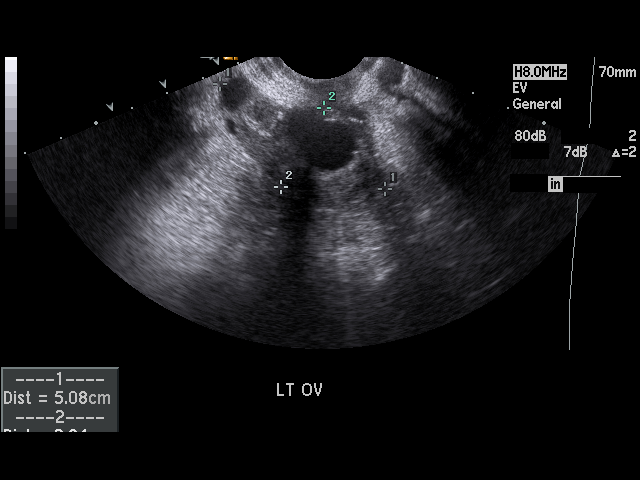
[im 40/73]
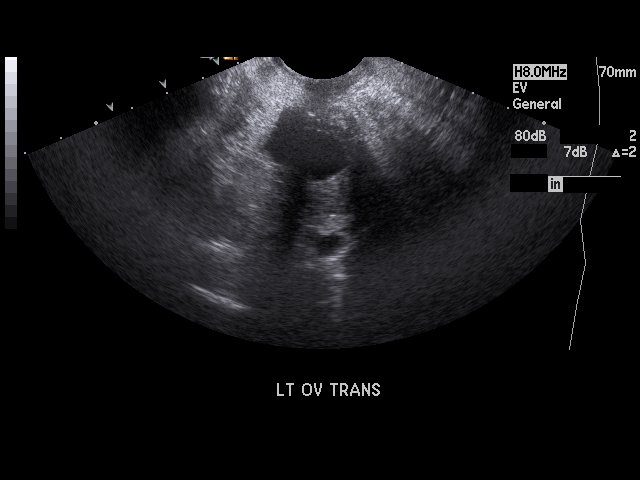
[im 46/73]
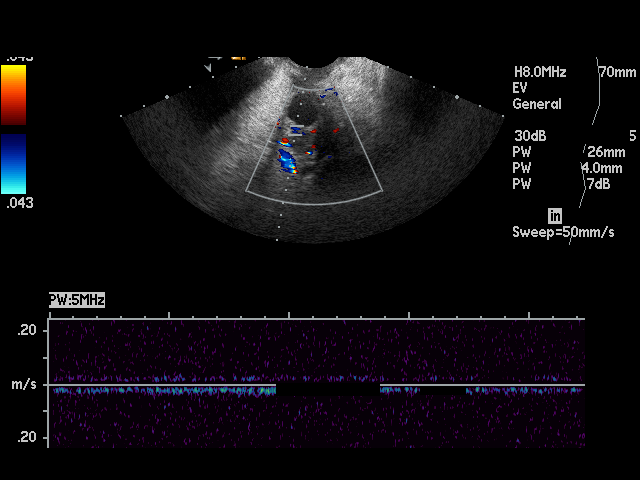
[im 49/73]
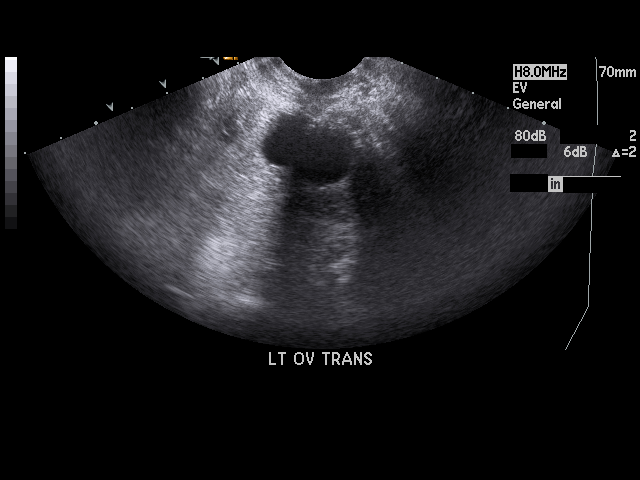
[im 55/73]
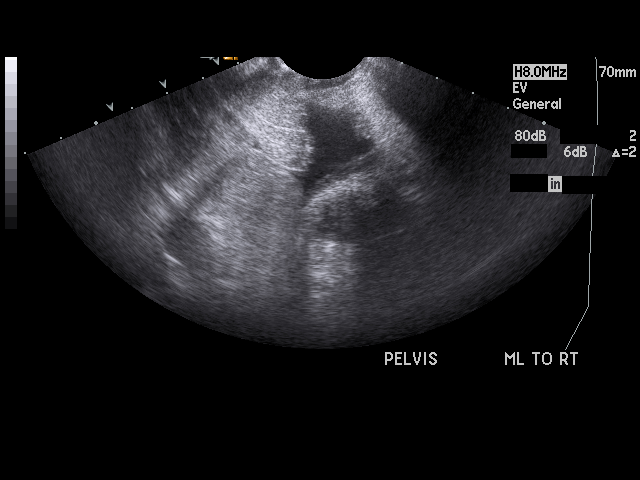
[im 58/73]
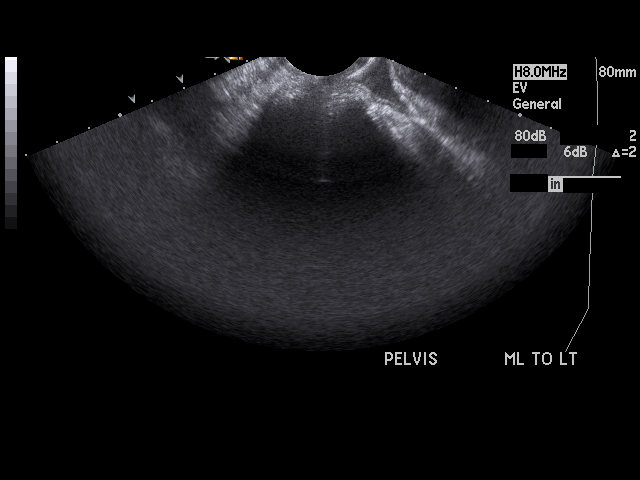
[im 64/73]
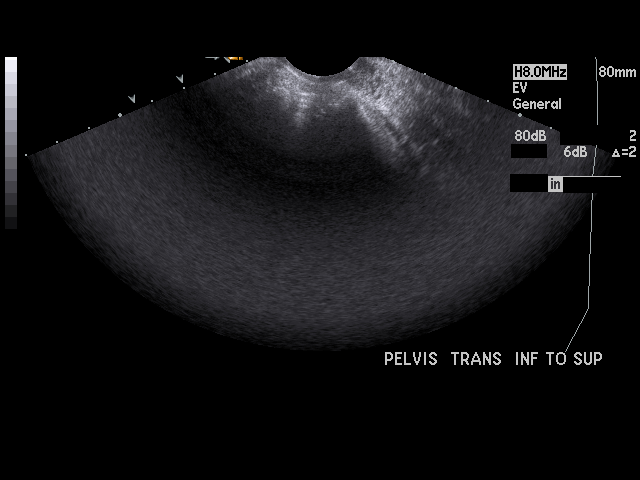
[im 67/73]
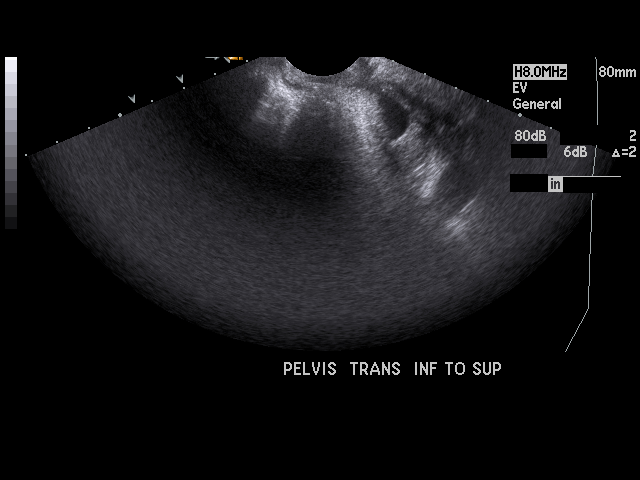
[im 73/73]
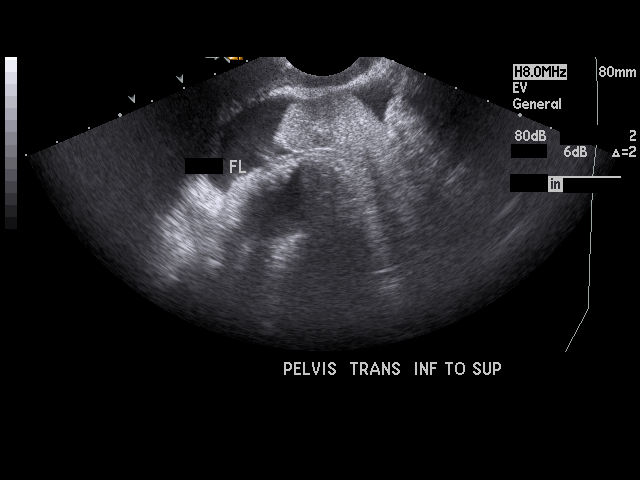

[17 of 25 positions shown; findings below may reference images not displayed]

PROCEDURE:     US  - US PELVIS EXAM W/TRANSVAGINAL  - May 29, 2009  [DATE]

RESULT:     Emergent transabdominal Pelvic Ultrasound is performed. The
patient reports a history of hysterectomy and right oophorectomy. The left
ovary is normal. Doppler images demonstrate blood flow present. There is a
trace of free fluid which is nonspecific. Ovarian follicles are present.
Endovaginal scanning is performed. The left ovary measures 5.1 x 2.3 x
cm. The kidneys appear to be grossly normal.
IMPRESSION: No significant abnormality on the transabdominal or
endovaginal scanning.

## 2009-10-26 IMAGING — CT CT ABD-PELV W/ CM
1 of 3 series · 13 of 32 positions shown, 19 images · non-contrast
Comparison: none

REASON FOR EXAM: (1) abd pain; (2) pel pain
COMMENTS:

[Series 5: delay · axial · delayed · 0.70mm/px · z∈[-962,-582]mm · 13 of 90 slices shown, 19 images]
[im 7/90  soft-tissue]
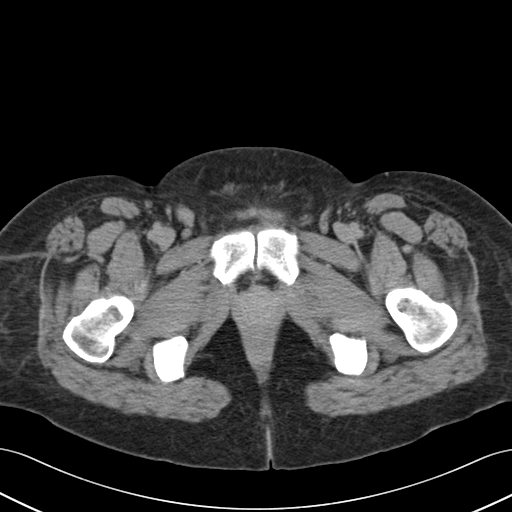
[im 7/90  bone]
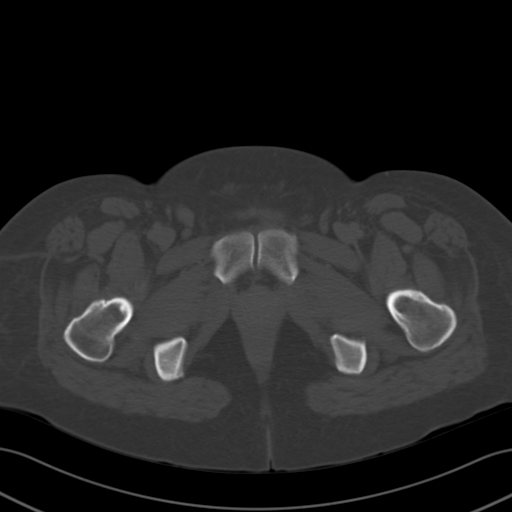
[im 13/90  soft-tissue]
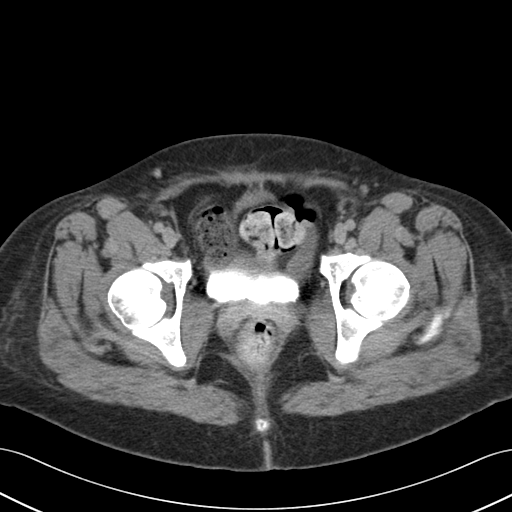
[im 20/90  soft-tissue]
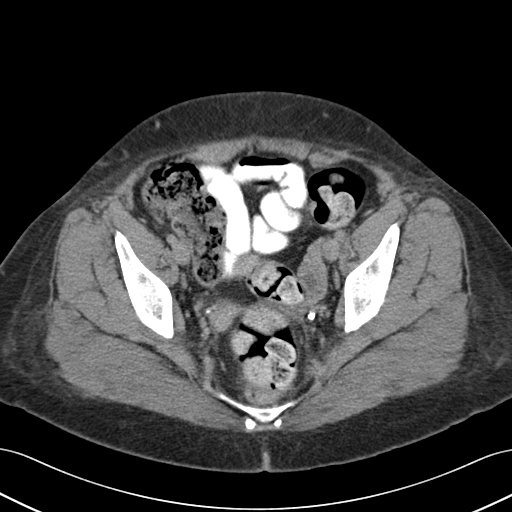
[im 26/90  soft-tissue]
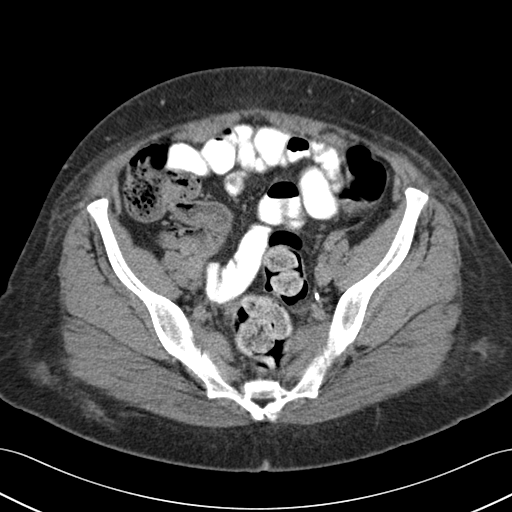
[im 32/90  soft-tissue]
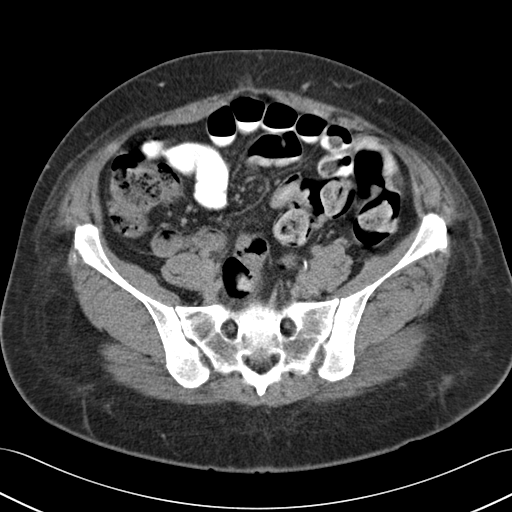
[im 39/90  soft-tissue]
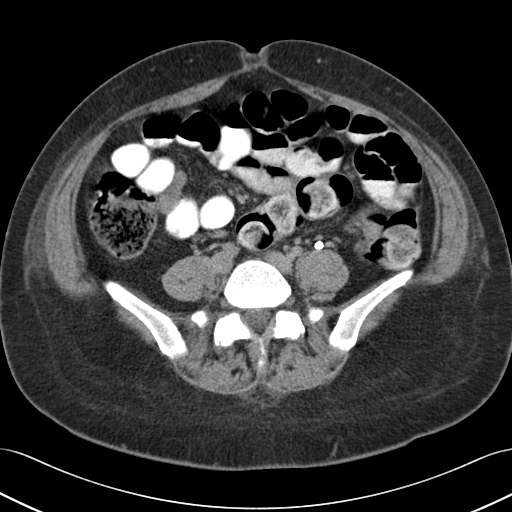
[im 45/90  soft-tissue]
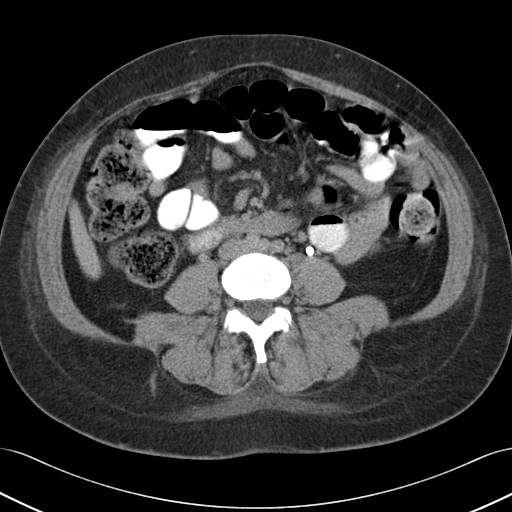
[im 51/90  soft-tissue]
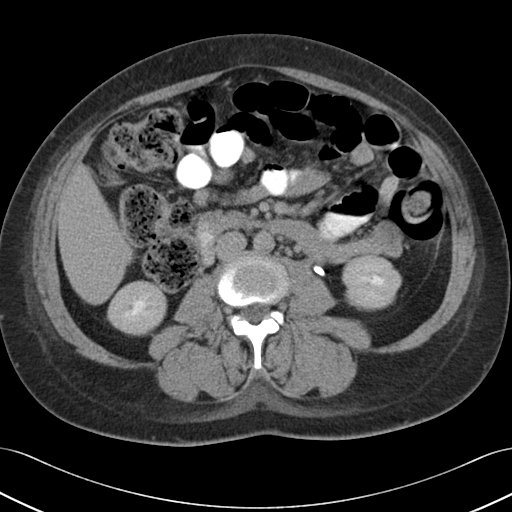
[im 58/90  soft-tissue]
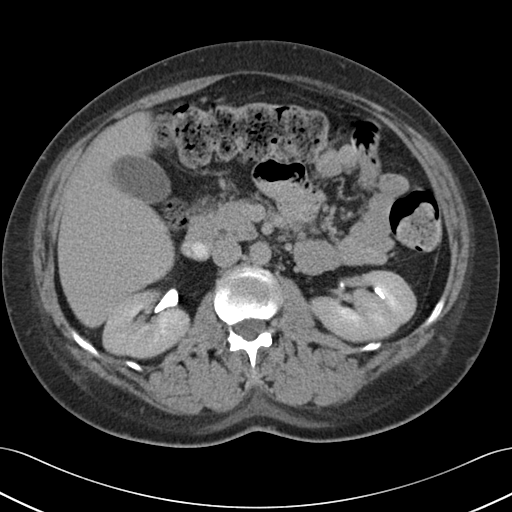
[im 58/90  bone]
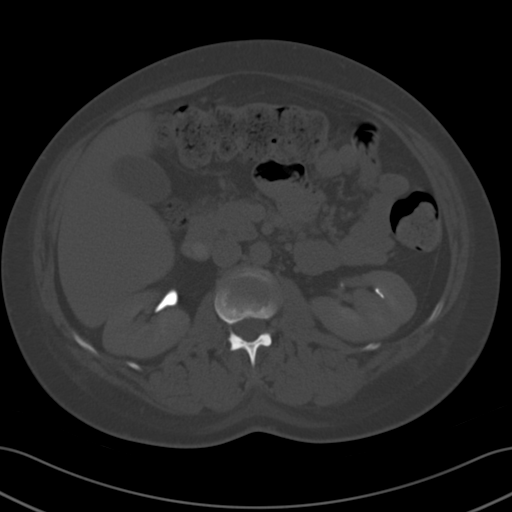
[im 64/90  soft-tissue]
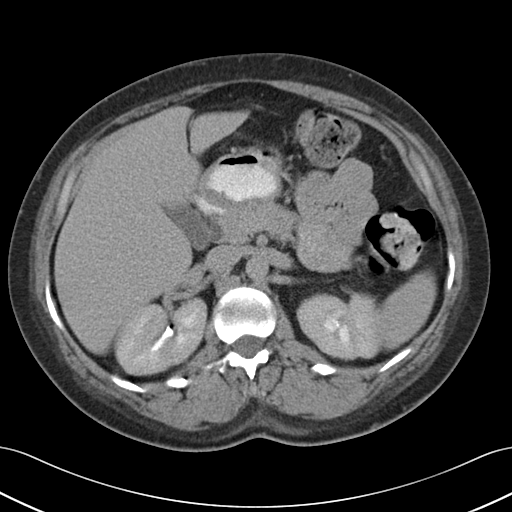
[im 64/90  lung]
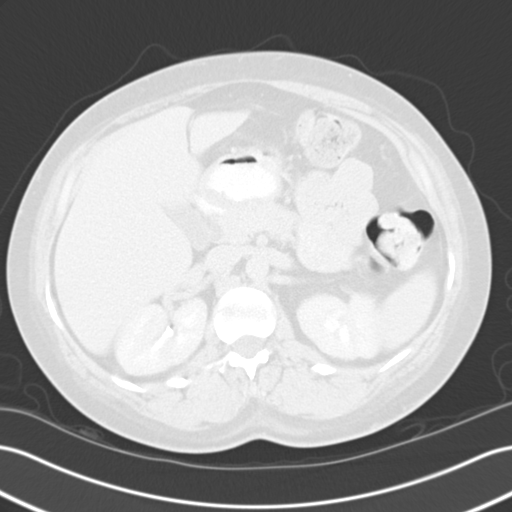
[im 70/90  soft-tissue]
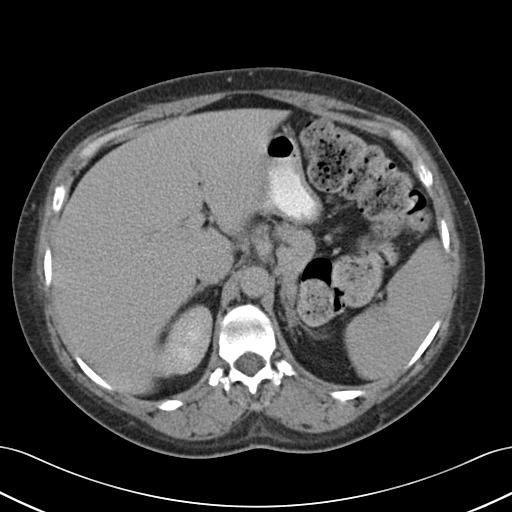
[im 70/90  lung]
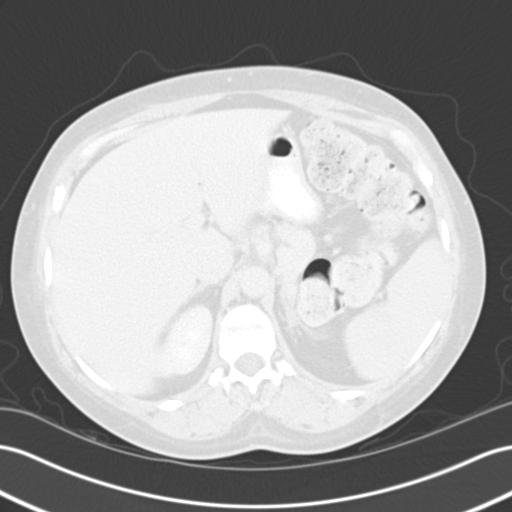
[im 77/90  soft-tissue]
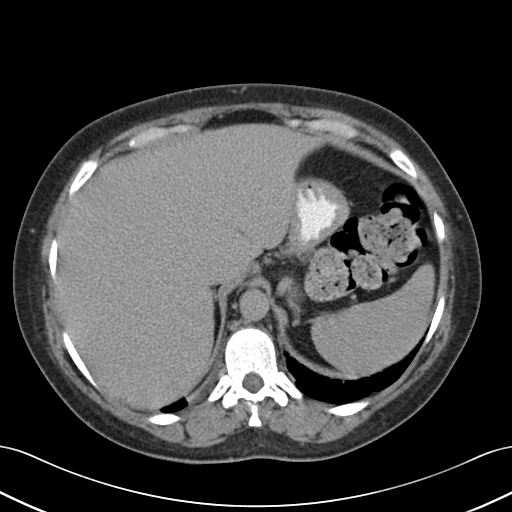
[im 77/90  lung]
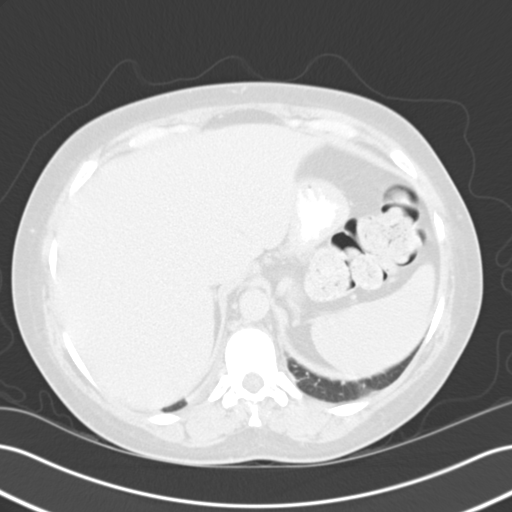
[im 83/90  soft-tissue]
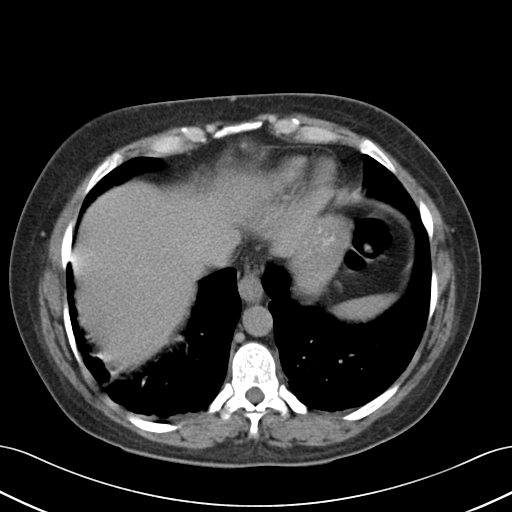
[im 83/90  lung]
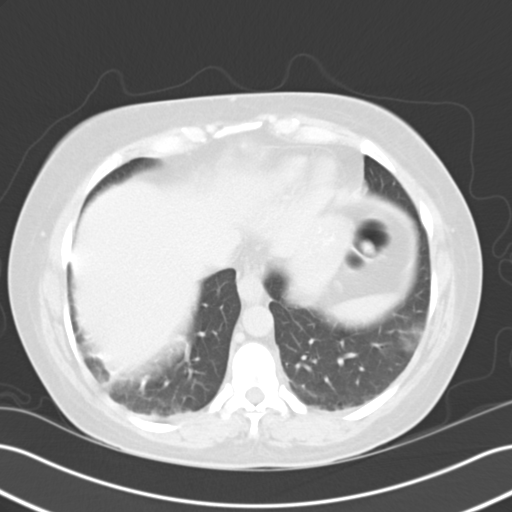

[13 of 32 positions shown; findings below may reference images not displayed]

PROCEDURE:     CT  - CT ABDOMEN / PELVIS  W  - May 29, 2009  [DATE]

RESULT:     CT of the abdomen and pelvis is performed utilizing 100 ml of
5sovue-HBD iodinated intravenous contrast along with oral contrast. Images
are reconstructed at 5 mm slice thickness.

Comparison is made to the study performed on 02/14/2008.
FINDINGS: There is dependent atelectasis at both lung bases. There is no
definite mass or infiltrate. Delayed images were obtained. The urinary
bladder is nondistended. The bowel is incompletely opacified. The aorta is
normal in caliber. The liver, spleen, pancreas, gallbladder, adrenal glands
and kidneys appear to be within normal limits. There is no adenopathy. There
is no abscess, free air or acute inflammatory stranding. Delayed images
demonstrate excretion of contrast opacified urine by both kidneys. The
patient has undergone appendectomy and hysterectomy. There is a small, fat
filled, umbilical hernia. There is a filling defect in the small bowel on
image #67 which could represent a polyp or less likely a lipoma. An ingested
density is not excluded. This has a Hounsfield reading of 21. There is a
moderate amount of stool in the colon which could represent constipation.
Correlate clinically. There is no abscess, free air or ascites.
IMPRESSION: 1.     No acute abnormality.
2.     There is dependent atelectasis at the lung bases.
3.     There appears to be a filling defect in the small bowel oral contrast
in the midline on image #67 which is nonspecific. There is a moderate amount
of stool in the colon raising the possibility of constipation.

## 2009-11-16 ENCOUNTER — Emergency Department: Payer: Self-pay | Admitting: Emergency Medicine

## 2009-11-21 ENCOUNTER — Emergency Department (HOSPITAL_COMMUNITY): Admission: EM | Admit: 2009-11-21 | Discharge: 2009-11-21 | Payer: Self-pay | Admitting: Emergency Medicine

## 2010-03-13 ENCOUNTER — Emergency Department (HOSPITAL_BASED_OUTPATIENT_CLINIC_OR_DEPARTMENT_OTHER): Admission: EM | Admit: 2010-03-13 | Discharge: 2010-03-13 | Payer: Self-pay | Admitting: Emergency Medicine

## 2010-03-24 ENCOUNTER — Emergency Department (HOSPITAL_COMMUNITY): Admission: EM | Admit: 2010-03-24 | Discharge: 2010-03-24 | Payer: Self-pay | Admitting: Emergency Medicine

## 2010-04-04 ENCOUNTER — Inpatient Hospital Stay (HOSPITAL_COMMUNITY): Admission: EM | Admit: 2010-04-04 | Discharge: 2010-04-06 | Payer: Self-pay | Admitting: Emergency Medicine

## 2010-04-05 ENCOUNTER — Encounter (INDEPENDENT_AMBULATORY_CARE_PROVIDER_SITE_OTHER): Payer: Self-pay | Admitting: Surgery

## 2010-04-05 HISTORY — PX: CHOLECYSTECTOMY: SHX55

## 2010-06-08 ENCOUNTER — Ambulatory Visit (HOSPITAL_COMMUNITY): Admission: RE | Admit: 2010-06-08 | Discharge: 2010-06-08 | Payer: Self-pay | Admitting: Orthopaedic Surgery

## 2010-07-21 ENCOUNTER — Encounter: Admission: RE | Admit: 2010-07-21 | Discharge: 2010-07-21 | Payer: Self-pay | Admitting: Cardiology

## 2010-08-30 ENCOUNTER — Emergency Department (HOSPITAL_COMMUNITY): Admission: EM | Admit: 2010-08-30 | Discharge: 2010-08-30 | Payer: Self-pay | Admitting: Emergency Medicine

## 2010-10-16 ENCOUNTER — Observation Stay (HOSPITAL_COMMUNITY)
Admission: EM | Admit: 2010-10-16 | Discharge: 2010-10-16 | Payer: Self-pay | Source: Home / Self Care | Attending: Emergency Medicine | Admitting: Emergency Medicine

## 2010-11-06 DIAGNOSIS — Z8719 Personal history of other diseases of the digestive system: Secondary | ICD-10-CM

## 2010-11-06 HISTORY — DX: Personal history of other diseases of the digestive system: Z87.19

## 2010-11-10 ENCOUNTER — Ambulatory Visit (HOSPITAL_COMMUNITY)
Admission: RE | Admit: 2010-11-10 | Discharge: 2010-11-10 | Payer: Self-pay | Source: Home / Self Care | Attending: Gastroenterology | Admitting: Gastroenterology

## 2010-11-10 LAB — LIPASE, BLOOD: Lipase: 43 U/L (ref 11–59)

## 2010-11-14 ENCOUNTER — Ambulatory Visit (HOSPITAL_COMMUNITY): Admission: RE | Admit: 2010-11-14 | Payer: Self-pay | Source: Home / Self Care | Admitting: Gastroenterology

## 2010-11-26 ENCOUNTER — Encounter: Payer: Self-pay | Admitting: Urology

## 2010-11-27 ENCOUNTER — Encounter: Payer: Self-pay | Admitting: Specialist

## 2010-11-27 ENCOUNTER — Encounter: Payer: Self-pay | Admitting: Orthopedic Surgery

## 2010-11-27 ENCOUNTER — Encounter: Payer: Self-pay | Admitting: Urology

## 2010-12-07 ENCOUNTER — Encounter: Payer: Self-pay | Admitting: Gastroenterology

## 2010-12-14 ENCOUNTER — Emergency Department (HOSPITAL_COMMUNITY)
Admission: EM | Admit: 2010-12-14 | Discharge: 2010-12-14 | Disposition: A | Discharge status: Home / Self Care | Payer: Commercial Managed Care - PPO | Attending: Emergency Medicine | Admitting: Emergency Medicine

## 2010-12-14 DIAGNOSIS — R1013 Epigastric pain: Secondary | ICD-10-CM | POA: Insufficient documentation

## 2010-12-14 DIAGNOSIS — R197 Diarrhea, unspecified: Secondary | ICD-10-CM | POA: Insufficient documentation

## 2010-12-14 DIAGNOSIS — I10 Essential (primary) hypertension: Secondary | ICD-10-CM | POA: Insufficient documentation

## 2010-12-14 DIAGNOSIS — R110 Nausea: Secondary | ICD-10-CM | POA: Insufficient documentation

## 2010-12-14 DIAGNOSIS — Z79899 Other long term (current) drug therapy: Secondary | ICD-10-CM | POA: Insufficient documentation

## 2010-12-14 LAB — CBC
MCH: 31.6 pg (ref 26.0–34.0)
MCHC: 35.2 g/dL (ref 30.0–36.0)
MCV: 89.7 fL (ref 78.0–100.0)
Platelets: 266 10*3/uL (ref 150–400)
RBC: 4.68 MIL/uL (ref 3.87–5.11)

## 2010-12-14 LAB — COMPREHENSIVE METABOLIC PANEL
Albumin: 4.3 g/dL (ref 3.5–5.2)
BUN: 7 mg/dL (ref 6–23)
Calcium: 9.3 mg/dL (ref 8.4–10.5)
Chloride: 109 mEq/L (ref 96–112)
Creatinine, Ser: 0.74 mg/dL (ref 0.4–1.2)
GFR calc non Af Amer: >60 mL/min (ref >60)
Total Bilirubin: 0.4 mg/dL (ref 0.3–1.2)

## 2010-12-14 LAB — DIFFERENTIAL
Eosinophils Absolute: 0.3 10*3/uL (ref 0.0–0.7)
Eosinophils Relative: 3 % (ref 0–5)
Lymphs Abs: 4.5 10*3/uL — ABNORMAL HIGH (ref 0.7–4.0)
Monocytes Absolute: 0.3 10*3/uL (ref 0.1–1.0)
Monocytes Relative: 4 % (ref 3–12)
Neutrophils Relative %: 34 % — ABNORMAL LOW (ref 43–77)

## 2010-12-14 LAB — POCT PREGNANCY, URINE: Preg Test, Ur: NEGATIVE

## 2010-12-14 LAB — URINALYSIS, ROUTINE W REFLEX MICROSCOPIC
Ketones, ur: NEGATIVE mg/dL
Nitrite: NEGATIVE
Protein, ur: NEGATIVE mg/dL
pH: 6.5 (ref 5.0–8.0)

## 2010-12-16 ENCOUNTER — Emergency Department (HOSPITAL_COMMUNITY)
Admission: EM | Admit: 2010-12-16 | Discharge: 2010-12-16 | Disposition: A | Discharge status: Home / Self Care | Payer: Commercial Managed Care - PPO | Attending: Emergency Medicine | Admitting: Emergency Medicine

## 2010-12-16 DIAGNOSIS — R109 Unspecified abdominal pain: Secondary | ICD-10-CM | POA: Insufficient documentation

## 2010-12-16 DIAGNOSIS — I10 Essential (primary) hypertension: Secondary | ICD-10-CM | POA: Insufficient documentation

## 2010-12-16 DIAGNOSIS — K8020 Calculus of gallbladder without cholecystitis without obstruction: Secondary | ICD-10-CM | POA: Insufficient documentation

## 2011-01-11 ENCOUNTER — Emergency Department (HOSPITAL_COMMUNITY)
Admission: EM | Admit: 2011-01-11 | Discharge: 2011-01-12 | Disposition: A | Discharge status: Home / Self Care | Payer: 59 | Attending: Emergency Medicine | Admitting: Emergency Medicine

## 2011-01-11 ENCOUNTER — Telehealth: Payer: Self-pay | Admitting: Gastroenterology

## 2011-01-11 DIAGNOSIS — R112 Nausea with vomiting, unspecified: Secondary | ICD-10-CM | POA: Insufficient documentation

## 2011-01-11 DIAGNOSIS — I10 Essential (primary) hypertension: Secondary | ICD-10-CM | POA: Insufficient documentation

## 2011-01-11 DIAGNOSIS — R1013 Epigastric pain: Secondary | ICD-10-CM | POA: Insufficient documentation

## 2011-01-11 DIAGNOSIS — R634 Abnormal weight loss: Secondary | ICD-10-CM | POA: Insufficient documentation

## 2011-01-11 DIAGNOSIS — R10816 Epigastric abdominal tenderness: Secondary | ICD-10-CM | POA: Insufficient documentation

## 2011-01-11 DIAGNOSIS — Z79899 Other long term (current) drug therapy: Secondary | ICD-10-CM | POA: Insufficient documentation

## 2011-01-11 LAB — CBC
MCHC: 33.6 g/dL (ref 30.0–36.0)
RDW: 13.1 % (ref 11.5–15.5)

## 2011-01-11 LAB — LIPASE, BLOOD: Lipase: 33 U/L (ref 11–59)

## 2011-01-11 LAB — COMPREHENSIVE METABOLIC PANEL
ALT: 44 U/L — ABNORMAL HIGH (ref 0–35)
Calcium: 9.1 mg/dL (ref 8.4–10.5)
GFR calc Af Amer: >60 mL/min (ref >60)
Glucose, Bld: 97 mg/dL (ref 70–99)
Sodium: 138 mEq/L (ref 135–145)
Total Protein: 6.9 g/dL (ref 6.0–8.3)

## 2011-01-11 LAB — URINALYSIS, ROUTINE W REFLEX MICROSCOPIC
Bilirubin Urine: NEGATIVE
Ketones, ur: NEGATIVE mg/dL
Nitrite: NEGATIVE
Urobilinogen, UA: 0.2 mg/dL (ref 0.0–1.0)

## 2011-01-11 LAB — DIFFERENTIAL
Basophils Absolute: 0 10*3/uL (ref 0.0–0.1)
Basophils Relative: 1 % (ref 0–1)
Eosinophils Relative: 4 % (ref 0–5)
Monocytes Absolute: 0.5 10*3/uL (ref 0.1–1.0)

## 2011-01-16 LAB — POCT I-STAT, CHEM 8
BUN: 5 mg/dL — ABNORMAL LOW (ref 6–23)
Calcium, Ion: 1.11 mmol/L — ABNORMAL LOW (ref 1.12–1.32)
Chloride: 107 mEq/L (ref 96–112)
Glucose, Bld: 97 mg/dL (ref 70–99)
Potassium: 3.8 mEq/L (ref 3.5–5.1)

## 2011-01-16 LAB — HEPATIC FUNCTION PANEL
ALT: 17 U/L (ref 0–35)
AST: 22 U/L (ref 0–37)
Albumin: 3.9 g/dL (ref 3.5–5.2)
Alkaline Phosphatase: 54 U/L (ref 39–117)
Total Protein: 7.2 g/dL (ref 6.0–8.3)

## 2011-01-16 LAB — CBC
MCV: 89.9 fL (ref 78.0–100.0)
Platelets: 263 10*3/uL (ref 150–400)
RBC: 4.67 MIL/uL (ref 3.87–5.11)
WBC: 6.7 10*3/uL (ref 4.0–10.5)

## 2011-01-16 LAB — DIFFERENTIAL
Lymphocytes Relative: 34 % (ref 12–46)
Lymphs Abs: 2.3 10*3/uL (ref 0.7–4.0)
Neutro Abs: 4 10*3/uL (ref 1.7–7.7)
Neutrophils Relative %: 59 % (ref 43–77)

## 2011-01-16 LAB — URINALYSIS, ROUTINE W REFLEX MICROSCOPIC
Bilirubin Urine: NEGATIVE
Nitrite: NEGATIVE
Specific Gravity, Urine: 1.015 (ref 1.005–1.030)
pH: 6 (ref 5.0–8.0)

## 2011-01-16 LAB — POCT PREGNANCY, URINE: Preg Test, Ur: NEGATIVE

## 2011-01-17 NOTE — Progress Notes (Signed)
Summary: Triage  Phone Note Call from Patient Call back at [phone]   Caller: Patient Call For: Dr. Arlyce Dice Reason for Call: Talk to Nurse Summary of Call: Patient is calling says that she spoke with Dr. Arlyce Dice and he wants to see patient next week, looks like she is a [name] Patient and was dismissed from our practice in 2008, she also Dr. Loreta Ave in January 2012 Initial call taken by: [place] Johnson,  January 11, 2011 10:41 AM  Follow-up for Phone Call        I agreed to see her.  OK to make an appointment Follow-up by: Louis Meckel MD,  January 12, 2011 8:11 AM  Additional Follow-up for Phone Call Additional follow up Details #1::        Appt made for patient to see Dr. Arlyce Dice, ok'd with Aurea Graff. Appt made for 02/20/11@3pm .  Additional Follow-up by: Selinda Michaels RN,  January 12, 2011 9:40 AM    Additional Follow-up for Phone Call Additional follow up Details #2::    Patient aware of appointment date and time. Follow-up by: Selinda Michaels RN,  January 12, 2011 3:43 PM

## 2011-01-23 LAB — DIFFERENTIAL
Basophils Absolute: 0.1 10*3/uL (ref 0.0–0.1)
Eosinophils Relative: 1 % (ref 0–5)
Lymphocytes Relative: 35 % (ref 12–46)
Lymphs Abs: 3.4 10*3/uL (ref 0.7–4.0)
Lymphs Abs: 3 10*3/uL (ref 0.7–4.0)
Monocytes Absolute: 0.4 10*3/uL (ref 0.1–1.0)
Monocytes Relative: 5 % (ref 3–12)
Neutro Abs: 4.4 10*3/uL (ref 1.7–7.7)
Neutro Abs: 5 10*3/uL (ref 1.7–7.7)

## 2011-01-23 LAB — COMPREHENSIVE METABOLIC PANEL
ALT: 30 U/L (ref 0–35)
AST: 29 U/L (ref 0–37)
AST: 17 U/L (ref 0–37)
Albumin: 3.2 g/dL — ABNORMAL LOW (ref 3.5–5.2)
Albumin: 4.1 g/dL (ref 3.5–5.2)
Alkaline Phosphatase: 146 U/L — ABNORMAL HIGH (ref 39–117)
Alkaline Phosphatase: 105 U/L (ref 39–117)
BUN: 4 mg/dL — ABNORMAL LOW (ref 6–23)
BUN: 7 mg/dL (ref 6–23)
CO2: 26 mEq/L (ref 19–32)
CO2: 21 mEq/L (ref 19–32)
Calcium: 9.3 mg/dL (ref 8.4–10.5)
Chloride: 104 mEq/L (ref 96–112)
Chloride: 107 mEq/L (ref 96–112)
Creatinine, Ser: 0.68 mg/dL (ref 0.4–1.2)
Creatinine, Ser: 0.68 mg/dL (ref 0.4–1.2)
Creatinine, Ser: 0.54 mg/dL (ref 0.4–1.2)
GFR calc Af Amer: >60 mL/min (ref >60)
GFR calc Af Amer: >60 mL/min (ref >60)
GFR calc non Af Amer: >60 mL/min (ref >60)
GFR calc non Af Amer: >60 mL/min (ref >60)
Glucose, Bld: 147 mg/dL — ABNORMAL HIGH (ref 70–99)
Potassium: 3.5 mEq/L (ref 3.5–5.1)
Sodium: 139 mEq/L (ref 135–145)
Total Bilirubin: 0.4 mg/dL (ref 0.3–1.2)
Total Bilirubin: 0.7 mg/dL (ref 0.3–1.2)
Total Protein: 6.1 g/dL (ref 6.0–8.3)

## 2011-01-23 LAB — URINALYSIS, ROUTINE W REFLEX MICROSCOPIC
Bilirubin Urine: NEGATIVE
Hgb urine dipstick: NEGATIVE
Nitrite: NEGATIVE
Nitrite: NEGATIVE
Protein, ur: NEGATIVE mg/dL
Specific Gravity, Urine: 1.013 (ref 1.005–1.030)
Specific Gravity, Urine: 1.017 (ref 1.005–1.030)
Urobilinogen, UA: 1 mg/dL (ref 0.0–1.0)
Urobilinogen, UA: 1 mg/dL (ref 0.0–1.0)
pH: 6 (ref 5.0–8.0)

## 2011-01-23 LAB — CBC
HCT: 40.3 % (ref 36.0–46.0)
Hemoglobin: 13.4 g/dL (ref 12.0–15.0)
MCHC: 34.3 g/dL (ref 30.0–36.0)
MCV: 88.3 fL (ref 78.0–100.0)
MCV: 88.4 fL (ref 78.0–100.0)
MCV: 87.3 fL (ref 78.0–100.0)
Platelets: 234 10*3/uL (ref 150–400)
Platelets: 239 10*3/uL (ref 150–400)
Platelets: 259 10*3/uL (ref 150–400)
RBC: 4.92 MIL/uL (ref 3.87–5.11)
RBC: 5.09 MIL/uL (ref 3.87–5.11)
RDW: 14.1 % (ref 11.5–15.5)
WBC: 8.4 10*3/uL (ref 4.0–10.5)

## 2011-01-23 LAB — LIPASE, BLOOD: Lipase: 31 U/L (ref 11–59)

## 2011-01-23 LAB — POCT PREGNANCY, URINE: Preg Test, Ur: NEGATIVE

## 2011-01-24 LAB — DIFFERENTIAL
Basophils Absolute: 0 10*3/uL (ref 0.0–0.1)
Eosinophils Relative: 3 % (ref 0–5)
Lymphocytes Relative: 45 % (ref 12–46)
Monocytes Absolute: 0.4 10*3/uL (ref 0.1–1.0)
Monocytes Relative: 7 % (ref 3–12)
Neutro Abs: 2.6 10*3/uL (ref 1.7–7.7)

## 2011-01-24 LAB — COMPREHENSIVE METABOLIC PANEL
AST: 30 U/L (ref 0–37)
Albumin: 4.1 g/dL (ref 3.5–5.2)
Alkaline Phosphatase: 143 U/L — ABNORMAL HIGH (ref 39–117)
BUN: 7 mg/dL (ref 6–23)
Chloride: 112 mEq/L (ref 96–112)
GFR calc Af Amer: >60 mL/min (ref >60)
Potassium: 3.9 mEq/L (ref 3.5–5.1)
Total Bilirubin: 0.3 mg/dL (ref 0.3–1.2)
Total Protein: 7.5 g/dL (ref 6.0–8.3)

## 2011-01-24 LAB — URINALYSIS, ROUTINE W REFLEX MICROSCOPIC
Ketones, ur: NEGATIVE mg/dL
Nitrite: NEGATIVE
Protein, ur: NEGATIVE mg/dL
Urobilinogen, UA: 0.2 mg/dL (ref 0.0–1.0)

## 2011-01-24 LAB — CBC
Platelets: 218 10*3/uL (ref 150–400)
RDW: 12.3 % (ref 11.5–15.5)
WBC: 5.9 10*3/uL (ref 4.0–10.5)

## 2011-02-02 ENCOUNTER — Other Ambulatory Visit (HOSPITAL_COMMUNITY): Payer: Self-pay | Admitting: Urology

## 2011-02-02 DIAGNOSIS — R3129 Other microscopic hematuria: Secondary | ICD-10-CM

## 2011-02-03 ENCOUNTER — Ambulatory Visit (HOSPITAL_COMMUNITY)
Admission: RE | Admit: 2011-02-03 | Discharge: 2011-02-03 | Disposition: A | Discharge status: Home / Self Care | Payer: 59 | Source: Ambulatory Visit | Attending: Urology | Admitting: Urology

## 2011-02-03 DIAGNOSIS — R310 Gross hematuria: Secondary | ICD-10-CM | POA: Insufficient documentation

## 2011-02-03 DIAGNOSIS — N281 Cyst of kidney, acquired: Secondary | ICD-10-CM | POA: Insufficient documentation

## 2011-02-03 DIAGNOSIS — R109 Unspecified abdominal pain: Secondary | ICD-10-CM | POA: Insufficient documentation

## 2011-02-03 DIAGNOSIS — R3129 Other microscopic hematuria: Secondary | ICD-10-CM

## 2011-02-03 MED ORDER — IOHEXOL 300 MG/ML  SOLN
100.0000 mL | Freq: Once | INTRAMUSCULAR | Status: AC | PRN
  Administered 2011-02-03: 100 mL via INTRAVENOUS

## 2011-02-08 LAB — URINE MICROSCOPIC-ADD ON

## 2011-02-08 LAB — COMPREHENSIVE METABOLIC PANEL
ALT: 21 U/L (ref 0–35)
Alkaline Phosphatase: 62 U/L (ref 39–117)
BUN: 8 mg/dL (ref 6–23)
BUN: 8 mg/dL (ref 6–23)
CO2: 25 mEq/L (ref 19–32)
Calcium: 8.1 mg/dL — ABNORMAL LOW (ref 8.4–10.5)
Chloride: 100 mEq/L (ref 96–112)
GFR calc non Af Amer: >60 mL/min (ref >60)
Glucose, Bld: 82 mg/dL (ref 70–99)
Glucose, Bld: 83 mg/dL (ref 70–99)
Potassium: 3.9 mEq/L (ref 3.5–5.1)
Sodium: 135 mEq/L (ref 135–145)
Sodium: 130 mEq/L — ABNORMAL LOW (ref 135–145)
Total Bilirubin: 0.5 mg/dL (ref 0.3–1.2)
Total Protein: 6.8 g/dL (ref 6.0–8.3)
Total Protein: 5.7 g/dL — ABNORMAL LOW (ref 6.0–8.3)

## 2011-02-08 LAB — DIFFERENTIAL
Basophils Absolute: 0.2 10*3/uL — ABNORMAL HIGH (ref 0.0–0.1)
Basophils Relative: 3 % — ABNORMAL HIGH (ref 0–1)
Eosinophils Absolute: 0.2 10*3/uL (ref 0.0–0.7)
Lymphocytes Relative: 25 % (ref 12–46)
Lymphs Abs: 2.6 10*3/uL (ref 0.7–4.0)
Monocytes Relative: 5 % (ref 3–12)
Monocytes Relative: 10 % (ref 3–12)
Neutro Abs: 3.1 10*3/uL (ref 1.7–7.7)
Neutro Abs: 6.1 10*3/uL (ref 1.7–7.7)
Neutrophils Relative %: 53 % (ref 43–77)
Neutrophils Relative %: 60 % (ref 43–77)

## 2011-02-08 LAB — URINALYSIS, ROUTINE W REFLEX MICROSCOPIC
Bilirubin Urine: NEGATIVE
Bilirubin Urine: NEGATIVE
Glucose, UA: NEGATIVE mg/dL
Hgb urine dipstick: NEGATIVE
Ketones, ur: NEGATIVE mg/dL
Nitrite: NEGATIVE
Protein, ur: NEGATIVE mg/dL
Specific Gravity, Urine: 1.017 (ref 1.005–1.030)
Urobilinogen, UA: 2 mg/dL — ABNORMAL HIGH (ref 0.0–1.0)
pH: 7 (ref 5.0–8.0)

## 2011-02-08 LAB — PREGNANCY, URINE: Preg Test, Ur: NEGATIVE

## 2011-02-08 LAB — CBC
HCT: 42.1 % (ref 36.0–46.0)
HCT: 35.2 % — ABNORMAL LOW (ref 36.0–46.0)
Hemoglobin: 14 g/dL (ref 12.0–15.0)
Hemoglobin: 12.4 g/dL (ref 12.0–15.0)
MCHC: 35.4 g/dL (ref 30.0–36.0)
Platelets: 212 10*3/uL (ref 150–400)
RBC: 4.36 MIL/uL (ref 3.87–5.11)
RDW: 13.1 % (ref 11.5–15.5)
WBC: 5.9 10*3/uL (ref 4.0–10.5)

## 2011-02-10 LAB — CBC
HCT: 41.8 % (ref 36.0–46.0)
Hemoglobin: 14.4 g/dL (ref 12.0–15.0)
MCV: 97.3 fL (ref 78.0–100.0)
RBC: 4.29 MIL/uL (ref 3.87–5.11)
WBC: 6.7 10*3/uL (ref 4.0–10.5)

## 2011-02-12 LAB — URINALYSIS, ROUTINE W REFLEX MICROSCOPIC
Glucose, UA: NEGATIVE mg/dL
Hgb urine dipstick: NEGATIVE
Ketones, ur: NEGATIVE mg/dL
pH: 5.5 (ref 5.0–8.0)

## 2011-02-12 LAB — CBC
HCT: 38.6 % (ref 36.0–46.0)
Hemoglobin: 13.4 g/dL (ref 12.0–15.0)
MCV: 95.8 fL (ref 78.0–100.0)
Platelets: 286 10*3/uL (ref 150–400)
RBC: 4.03 MIL/uL (ref 3.87–5.11)
WBC: 6.2 10*3/uL (ref 4.0–10.5)

## 2011-02-12 LAB — URINE CULTURE: Colony Count: >=100000

## 2011-02-12 LAB — POCT I-STAT, CHEM 8
BUN: 14 mg/dL (ref 6–23)
HCT: 42 % (ref 36.0–46.0)
Hemoglobin: 14.3 g/dL (ref 12.0–15.0)
Sodium: 135 mEq/L (ref 135–145)
TCO2: 23 mmol/L (ref 0–100)

## 2011-02-12 LAB — URINE MICROSCOPIC-ADD ON

## 2011-02-12 LAB — DIFFERENTIAL
Eosinophils Absolute: 0.2 10*3/uL (ref 0.0–0.7)
Eosinophils Relative: 3 % (ref 0–5)
Lymphocytes Relative: 29 % (ref 12–46)
Lymphs Abs: 1.8 10*3/uL (ref 0.7–4.0)
Monocytes Relative: 12 % (ref 3–12)

## 2011-02-13 LAB — POCT I-STAT, CHEM 8
HCT: 37 % (ref 36.0–46.0)
Hemoglobin: 12.6 g/dL (ref 12.0–15.0)
Sodium: 133 mEq/L — ABNORMAL LOW (ref 135–145)
TCO2: 26 mmol/L (ref 0–100)

## 2011-02-13 LAB — DIFFERENTIAL
Basophils Relative: 0 % (ref 0–1)
Eosinophils Absolute: 0 10*3/uL (ref 0.0–0.7)
Monocytes Relative: 9 % (ref 3–12)
Neutro Abs: 7 10*3/uL (ref 1.7–7.7)
Neutrophils Relative %: 73 % (ref 43–77)

## 2011-02-13 LAB — URINALYSIS, ROUTINE W REFLEX MICROSCOPIC
Ketones, ur: 15 mg/dL — AB
Nitrite: NEGATIVE
Protein, ur: 30 mg/dL — AB

## 2011-02-13 LAB — GLUCOSE, CAPILLARY: Glucose-Capillary: 124 mg/dL — ABNORMAL HIGH (ref 70–99)

## 2011-02-13 LAB — URINE CULTURE: Colony Count: 80000

## 2011-02-13 LAB — CBC
MCHC: 34.4 g/dL (ref 30.0–36.0)
Platelets: 189 10*3/uL (ref 150–400)
RBC: 3.63 MIL/uL — ABNORMAL LOW (ref 3.87–5.11)
WBC: 9.6 10*3/uL (ref 4.0–10.5)

## 2011-02-13 LAB — VALPROIC ACID LEVEL: Valproic Acid Lvl: 17.3 ug/mL — ABNORMAL LOW (ref 50.0–100.0)

## 2011-02-14 LAB — DIFFERENTIAL
Basophils Absolute: 0.1 10*3/uL (ref 0.0–0.1)
Eosinophils Relative: 1 % (ref 0–5)
Lymphocytes Relative: 42 % (ref 12–46)
Lymphs Abs: 3.7 10*3/uL (ref 0.7–4.0)
Neutro Abs: 4.4 10*3/uL (ref 1.7–7.7)
Neutrophils Relative %: 50 % (ref 43–77)

## 2011-02-14 LAB — RAPID URINE DRUG SCREEN, HOSP PERFORMED
Cocaine: NOT DETECTED
Opiates: NOT DETECTED
Tetrahydrocannabinol: NOT DETECTED

## 2011-02-14 LAB — POCT I-STAT, CHEM 8
BUN: 5 mg/dL — ABNORMAL LOW (ref 6–23)
Calcium, Ion: 1.14 mmol/L (ref 1.12–1.32)
Chloride: 106 mEq/L (ref 96–112)
HCT: 46 % (ref 36.0–46.0)
Potassium: 3.7 mEq/L (ref 3.5–5.1)
Sodium: 142 mEq/L (ref 135–145)

## 2011-02-14 LAB — URINALYSIS, ROUTINE W REFLEX MICROSCOPIC
Bilirubin Urine: NEGATIVE
Nitrite: NEGATIVE
Specific Gravity, Urine: 1.007 (ref 1.005–1.030)
Urobilinogen, UA: 0.2 mg/dL (ref 0.0–1.0)
pH: 7 (ref 5.0–8.0)

## 2011-02-14 LAB — CBC
HCT: 43.6 % (ref 36.0–46.0)
Platelets: 255 10*3/uL (ref 150–400)
RDW: 13.8 % (ref 11.5–15.5)
WBC: 8.8 10*3/uL (ref 4.0–10.5)

## 2011-02-14 LAB — ETHANOL: Alcohol, Ethyl (B): <5 mg/dL (ref 0–10)

## 2011-02-14 LAB — POCT PREGNANCY, URINE: Preg Test, Ur: NEGATIVE

## 2011-02-14 LAB — VALPROIC ACID LEVEL: Valproic Acid Lvl: 28.8 ug/mL — ABNORMAL LOW (ref 50.0–100.0)

## 2011-02-15 LAB — URINALYSIS, ROUTINE W REFLEX MICROSCOPIC
Bilirubin Urine: NEGATIVE
Hgb urine dipstick: NEGATIVE
Nitrite: NEGATIVE
Protein, ur: NEGATIVE mg/dL
Urobilinogen, UA: 1 mg/dL (ref 0.0–1.0)

## 2011-02-15 LAB — COMPREHENSIVE METABOLIC PANEL
Albumin: 4 g/dL (ref 3.5–5.2)
Alkaline Phosphatase: 69 U/L (ref 39–117)
BUN: 4 mg/dL — ABNORMAL LOW (ref 6–23)
Chloride: 107 mEq/L (ref 96–112)
Potassium: 3.7 mEq/L (ref 3.5–5.1)
Total Bilirubin: 0.5 mg/dL (ref 0.3–1.2)

## 2011-02-15 LAB — CBC
HCT: 40.9 % (ref 36.0–46.0)
Hemoglobin: 14.3 g/dL (ref 12.0–15.0)
Platelets: 246 10*3/uL (ref 150–400)
WBC: 7.6 10*3/uL (ref 4.0–10.5)

## 2011-02-15 LAB — DIFFERENTIAL
Basophils Absolute: 0 10*3/uL (ref 0.0–0.1)
Basophils Relative: 0 % (ref 0–1)
Eosinophils Absolute: 0.2 10*3/uL (ref 0.0–0.7)
Eosinophils Relative: 2 % (ref 0–5)
Monocytes Absolute: 0.3 10*3/uL (ref 0.1–1.0)
Neutro Abs: 4.8 10*3/uL (ref 1.7–7.7)

## 2011-02-15 LAB — URINE CULTURE: Colony Count: 4000

## 2011-02-15 LAB — POCT I-STAT, CHEM 8
Calcium, Ion: 1.08 mmol/L — ABNORMAL LOW (ref 1.12–1.32)
Glucose, Bld: 93 mg/dL (ref 70–99)
HCT: 43 % (ref 36.0–46.0)
Hemoglobin: 14.6 g/dL (ref 12.0–15.0)
Potassium: 3.8 mEq/L (ref 3.5–5.1)
TCO2: 21 mmol/L (ref 0–100)

## 2011-02-15 LAB — PREGNANCY, URINE: Preg Test, Ur: NEGATIVE

## 2011-02-20 ENCOUNTER — Ambulatory Visit (INDEPENDENT_AMBULATORY_CARE_PROVIDER_SITE_OTHER): Payer: 59 | Admitting: Gastroenterology

## 2011-02-20 ENCOUNTER — Encounter: Payer: Self-pay | Admitting: Gastroenterology

## 2011-02-20 ENCOUNTER — Other Ambulatory Visit (INDEPENDENT_AMBULATORY_CARE_PROVIDER_SITE_OTHER): Payer: 59

## 2011-02-20 DIAGNOSIS — R109 Unspecified abdominal pain: Secondary | ICD-10-CM

## 2011-02-20 DIAGNOSIS — R1013 Epigastric pain: Secondary | ICD-10-CM

## 2011-02-20 LAB — CK: Total CK: 50 U/L (ref 7–177)

## 2011-02-20 LAB — BUN: BUN: 9 mg/dL (ref 6–23)

## 2011-02-20 MED ORDER — HYOSCYAMINE SULFATE 0.125 MG SL SUBL: 0.2500 mg | SUBLINGUAL_TABLET | SUBLINGUAL | Status: DC | PRN

## 2011-02-20 NOTE — Progress Notes (Signed)
History of Present Illness:  [name] is a 40 year old white female self-referred for evaluation of abdominal pain. Over the past 3-1/2 months she's been complaining of severe, sharp, upper midepigastric pain that may radiate to the back. It may last hours at a time. Pain occurs anytime during the day and will awaken her. It can last minutes to several hours. With bending over pain may slightly decrease. Pain may cause nausea although is not accompanied by nausea, per se. LFTs have been noted to be slightly abnormal with SGOT as high as 203, and SGPT as high as 84.  CT of the abdomen was unremarkable. Upper endoscopy in January was unremarkable except for mild gastritis. 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 She previously underwent a cholecystectomy for biliary sludge.  Intraoperative cholangiogram was normal.  At that time her main complaint was nausea. She denies fevers or change in the color of her urine.  She takes protonix.She has no other GI complaints, including pyrosis, dysphagia, or change in bowel habits.  Over the past 7 years. She has had 9 CT scans of the abdomen and pelvis.    Review of Systems: Pertinent positive and negative review of systems were noted in the above HPI section. All other review of systems were otherwise negative.    Current Medications, Allergies, Past Medical History, Past Surgical History, Family History and Social History were reviewed in [employer] electronic medical record  Vital signs were reviewed in today's medical record. Physical Exam: General: Well developed , well nourished, no acute distress Head: Normocephalic and atraumatic Eyes:  sclerae anicteric, EOMI Ears: Normal auditory acuity Mouth: No deformity or lesions Lungs: Clear throughout to auscultation Heart: Regular rate and rhythm; no murmurs, rubs or bruits Abdomen: Soft, non tender and non distended. No masses,  hepatosplenomegaly or hernias noted. Normal Bowel sounds Rectal:deferred Musculoskeletal: Symmetrical with no gross deformities  Pulses:  Normal pulses noted Extremities: No clubbing, cyanosis, edema or deformities noted Neurological: Alert oriented x 4, grossly nonfocal Psychological:  Alert and cooperative. Normal mood and affect

## 2011-02-20 NOTE — Assessment & Plan Note (Addendum)
Abdominal pain is suggestive of biliary tract pain, be it from a retained stone or sphincter of Oddi dysfunction. Temporary elevation of her liver enzymes are consistent with this.  Recommendations #1 stat evaluation  during pain. #2 trial of hyomax. #3 MRCP

## 2011-02-20 NOTE — Patient Instructions (Signed)
Your MRI is scheduled at [facility] at [facility] 4/19 at 6pm Nothing to eat or drink 4 hours prior You will go to the basement for labs today

## 2011-02-21 ENCOUNTER — Encounter: Payer: Self-pay | Admitting: Gastroenterology

## 2011-02-21 ENCOUNTER — Telehealth: Payer: Self-pay | Admitting: Gastroenterology

## 2011-02-21 NOTE — Telephone Encounter (Signed)
Give her ativan 1mg  45 minutes prior to scan

## 2011-02-21 NOTE — Telephone Encounter (Signed)
Pt states that her insurance is through [employer] and that she needs to have her MRI scheduled at [facility] due to the cost. She was scheduled for an open MRI, [facility] does not have these. Pt would like to be sedated for the MRI. Pt wants to know if this is an option. Dr. Arlyce Dice please advise.

## 2011-02-22 MED ORDER — LORAZEPAM 1 MG PO TABS: ORAL_TABLET | ORAL | Status: DC

## 2011-02-22 NOTE — Telephone Encounter (Signed)
MRI rescheduled for [facility] on 02/24/11@7pm . Arrival time of 6:45pm. Pt to be NPO after 3pm. Ativan rx sent to pharmacy. Pt to take this 45 min prior to MRI. Pt aware.

## 2011-02-23 ENCOUNTER — Other Ambulatory Visit: Payer: 59

## 2011-02-23 ENCOUNTER — Telehealth: Payer: Self-pay | Admitting: Gastroenterology

## 2011-02-23 MED ORDER — LORAZEPAM 1 MG PO TABS: ORAL_TABLET | ORAL | Status: DC

## 2011-02-23 NOTE — Telephone Encounter (Signed)
Give her a total of 5 1mg  ativan tabs.  Begin with 2 1 hour prior to the exam.  She can take 1-2 more if she still is anxious

## 2011-02-23 NOTE — Telephone Encounter (Signed)
Pt called the radiology dept and states that they don't think the ativan 1mg  po prior to the mri will be enough for her to get through the mri. Pt states that she takes Valium 10mg  po tid and that just "keeps her even." Pt wants to know if there is something else we can do, she doesn't want to start the test and have to stop in the middle due to a panic attack. Dr. Arlyce Dice please advise.

## 2011-02-23 NOTE — Telephone Encounter (Signed)
Pt aware of Dr. Marzetta Board recommendations. Rx sent to pharmacy.

## 2011-02-24 ENCOUNTER — Ambulatory Visit (HOSPITAL_COMMUNITY)
Admission: RE | Admit: 2011-02-24 | Discharge: 2011-02-24 | Disposition: A | Discharge status: Home / Self Care | Payer: 59 | Source: Ambulatory Visit | Attending: Gastroenterology | Admitting: Gastroenterology

## 2011-02-24 ENCOUNTER — Telehealth: Payer: Self-pay | Admitting: Gastroenterology

## 2011-02-24 DIAGNOSIS — R109 Unspecified abdominal pain: Secondary | ICD-10-CM | POA: Insufficient documentation

## 2011-02-24 DIAGNOSIS — Q619 Cystic kidney disease, unspecified: Secondary | ICD-10-CM | POA: Insufficient documentation

## 2011-02-24 LAB — CREATININE, SERUM
Creatinine, Ser: 0.74 mg/dL (ref 0.4–1.2)
GFR calc Af Amer: >60 mL/min (ref >60)
GFR calc non Af Amer: >60 mL/min (ref >60)

## 2011-02-24 MED ORDER — GADOBENATE DIMEGLUMINE 529 MG/ML IV SOLN
15.0000 mL | Freq: Once | INTRAVENOUS | Status: AC | PRN
  Administered 2011-02-24: 15 mL via INTRAVENOUS

## 2011-02-24 NOTE — Telephone Encounter (Signed)
Spoke with pt and med has been called in.

## 2011-02-28 ENCOUNTER — Telehealth: Payer: Self-pay | Admitting: Gastroenterology

## 2011-02-28 ENCOUNTER — Emergency Department (HOSPITAL_COMMUNITY)
Admission: EM | Admit: 2011-02-28 | Discharge: 2011-03-01 | Disposition: A | Discharge status: Home / Self Care | Payer: 59 | Attending: Emergency Medicine | Admitting: Emergency Medicine

## 2011-02-28 DIAGNOSIS — R1013 Epigastric pain: Secondary | ICD-10-CM | POA: Insufficient documentation

## 2011-02-28 DIAGNOSIS — E860 Dehydration: Secondary | ICD-10-CM | POA: Insufficient documentation

## 2011-02-28 DIAGNOSIS — Z9889 Other specified postprocedural states: Secondary | ICD-10-CM | POA: Insufficient documentation

## 2011-02-28 DIAGNOSIS — G8929 Other chronic pain: Secondary | ICD-10-CM | POA: Insufficient documentation

## 2011-02-28 DIAGNOSIS — R10816 Epigastric abdominal tenderness: Secondary | ICD-10-CM | POA: Insufficient documentation

## 2011-02-28 DIAGNOSIS — I10 Essential (primary) hypertension: Secondary | ICD-10-CM | POA: Insufficient documentation

## 2011-02-28 DIAGNOSIS — R110 Nausea: Secondary | ICD-10-CM | POA: Insufficient documentation

## 2011-02-28 NOTE — Telephone Encounter (Signed)
On call note @ 2100. Pt has worsening of her chronic epigastric pain over the past few days. Not controlled with Protonix and Levsin. I reviewed Dr. Marzetta Board recent note and MRCP. I reviewed Dr. Kenna Gilbert EGD report from 11/2010. I gave the patient the report from her MRCP as she said she had not received the results of this report. I advised her to stay on clear liquids tonight and contact Dr. Marzetta Board office tomorrow for further advice.

## 2011-03-01 ENCOUNTER — Telehealth: Payer: Self-pay | Admitting: Gastroenterology

## 2011-03-01 LAB — POCT I-STAT, CHEM 8
BUN: 3 mg/dL — ABNORMAL LOW (ref 6–23)
Calcium, Ion: 1.2 mmol/L (ref 1.12–1.32)
Chloride: 104 mEq/L (ref 96–112)
Glucose, Bld: 94 mg/dL (ref 70–99)
Potassium: 3.8 mEq/L (ref 3.5–5.1)

## 2011-03-01 LAB — URINALYSIS, ROUTINE W REFLEX MICROSCOPIC
Bilirubin Urine: NEGATIVE
Hgb urine dipstick: NEGATIVE
Ketones, ur: NEGATIVE mg/dL
Nitrite: NEGATIVE
Urobilinogen, UA: 0.2 mg/dL (ref 0.0–1.0)

## 2011-03-01 NOTE — Telephone Encounter (Signed)
Spoke with pt and let her know that per Dr. Marina Goodell looked at her scan and her MRI results did not show any acute abdominal findings. Pt states that she went to the ER last night for abdominal pain and received IV fluids and IV Dilaudid for pain. Pt would like to know what she can do for the abdominal pain. States Dr. Arlyce Dice gave her Hyomax 0.125mg  SL and that on two other occasions this worked for the pain, last night it did not help. Pt given an appt to see Kendall Arnell PA 03/02/11 @8 :30am. Pt aware of appt date and time.

## 2011-03-02 ENCOUNTER — Telehealth: Payer: Self-pay | Admitting: *Deleted

## 2011-03-02 ENCOUNTER — Other Ambulatory Visit (INDEPENDENT_AMBULATORY_CARE_PROVIDER_SITE_OTHER): Payer: 59

## 2011-03-02 ENCOUNTER — Encounter: Payer: Self-pay | Admitting: Physician Assistant

## 2011-03-02 ENCOUNTER — Ambulatory Visit (INDEPENDENT_AMBULATORY_CARE_PROVIDER_SITE_OTHER): Payer: 59 | Admitting: Physician Assistant

## 2011-03-02 VITALS — BP 128/76 | HR 64 | Ht 66.0 in | Wt 165.0 lb

## 2011-03-02 DIAGNOSIS — F1911 Other psychoactive substance abuse, in remission: Secondary | ICD-10-CM | POA: Insufficient documentation

## 2011-03-02 DIAGNOSIS — G43909 Migraine, unspecified, not intractable, without status migrainosus: Secondary | ICD-10-CM | POA: Insufficient documentation

## 2011-03-02 DIAGNOSIS — F1910 Other psychoactive substance abuse, uncomplicated: Secondary | ICD-10-CM

## 2011-03-02 DIAGNOSIS — K838 Other specified diseases of biliary tract: Secondary | ICD-10-CM

## 2011-03-02 DIAGNOSIS — F32A Depression, unspecified: Secondary | ICD-10-CM | POA: Insufficient documentation

## 2011-03-02 DIAGNOSIS — R1013 Epigastric pain: Secondary | ICD-10-CM

## 2011-03-02 DIAGNOSIS — F329 Major depressive disorder, single episode, unspecified: Secondary | ICD-10-CM | POA: Insufficient documentation

## 2011-03-02 DIAGNOSIS — I10 Essential (primary) hypertension: Secondary | ICD-10-CM

## 2011-03-02 LAB — CBC WITH DIFFERENTIAL/PLATELET
Eosinophils Relative: 2.5 % (ref 0.0–5.0)
HCT: 39.7 % (ref 36.0–46.0)
Hemoglobin: 13.7 g/dL (ref 12.0–15.0)
Lymphs Abs: 2.5 10*3/uL (ref 0.7–4.0)
MCV: 94 fl (ref 78.0–100.0)
Monocytes Absolute: 0.3 10*3/uL (ref 0.1–1.0)
Monocytes Relative: 3 % (ref 3.0–12.0)
Neutro Abs: 5.5 10*3/uL (ref 1.4–7.7)
Platelets: 294 10*3/uL (ref 150.0–400.0)
RDW: 13.8 % (ref 11.5–14.6)
WBC: 8.5 10*3/uL (ref 4.5–10.5)

## 2011-03-02 LAB — HEPATIC FUNCTION PANEL
ALT: 21 U/L (ref 0–35)
AST: 23 U/L (ref 0–37)
Albumin: 3.8 g/dL (ref 3.5–5.2)
Total Bilirubin: 0.3 mg/dL (ref 0.3–1.2)
Total Protein: 6.7 g/dL (ref 6.0–8.3)

## 2011-03-02 LAB — LIPASE: Lipase: 35 U/L (ref 11.0–59.0)

## 2011-03-02 MED ORDER — OXYCODONE-ACETAMINOPHEN 7.5-325 MG PO TABS: 1.0000 | ORAL_TABLET | ORAL | Status: DC | PRN

## 2011-03-02 NOTE — Patient Instructions (Addendum)
Please go to the basement level to have your labs drawn.  Take Levsin 1 every 6 hours on a regular basis.   Try not to wait for an episode of pain to take the medication. We will call you with an appointment with Dr. Arlyce Dice for an ERCP at [facility].

## 2011-03-02 NOTE — Telephone Encounter (Signed)
Called pt at her cell # [phone] regarding appt for ERCP at [facility]. Asked her to call me back.

## 2011-03-02 NOTE — Progress Notes (Signed)
Reviewed and agree with plans for ERCP only if LFTs elevated in a pattern suggestive of biliary disease. Elie Goody. MD [facility]

## 2011-03-02 NOTE — Telephone Encounter (Signed)
The patient did call me back and I advised her of the appointment for the ERCP at [facility] with Dr. Arlyce Dice on 5 -06-2011. Booking # [id number]. Also told pt we made her an appt for a previst anesthesia  appt regarding the Propofol sedation. That is for the day before the ERCP, on 03-13-2011 at [facility].  She is to report to the Endoscopy Unit.  The pt was satisfied with  the appointments.

## 2011-03-02 NOTE — Progress Notes (Signed)
Subjective:    Patient ID: [name], female    DOB: 1971-01-06, 40 y.o.   MRN: [id number]  HPI Kennon Rounds is a 40 year old white female known to Dr. Arlyce Dice who was recently seen for recurrent epigastric pain. She says that she has been having intermittent episodes of upper abdominal pain over the past 4 months. These episodes or not occurring daily that happened at least a couple of times per week and at times are intense. She says that the pain may last anywhere from 15 minutes to a couple of hours and is associated with nausea without vomiting. She has not had any fever or chills and no change in her bowel habits. Her appetite has been decreased and she does think that he seems to aggravate the pain and therefore has been needing less. She has lost about 10 pounds. She was noted to have mildly elevated LFTs at one point within the past couple of months and therefore was set up for an MRI with MRCP. 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 Common bile duct measures 12 mm and tapers distally, there was no evidence of choledocholithiasis and was an otherwise negative exam. She had been given a trial of Levsin sublingual to use which he said initially helped quite a bit but her last 2 episodes were not relieved with the Levsin. She is on Protonix on a daily basis as well.  She had an ER visit 2 nights ago due to an intense episode of pain not relieved by Levsin. She had labs done with CBC and met which were normal but no liver function studies were done. She says she had another episode of pain last night that lasted for a couple of hours.    Review of Systems  Constitutional: Positive for appetite change and unexpected weight change.  HENT: Negative.   Eyes: Negative.   Respiratory: Negative.   Cardiovascular: Negative.   Gastrointestinal: Positive for nausea and abdominal pain.  Genitourinary: Negative.   Musculoskeletal: Negative.     Skin: Negative.   Neurological: Negative.   Hematological: Negative.   Psychiatric/Behavioral: Negative.        Objective:   Physical Exam Well-developed anxious white female in no acute distress, alert and oriented x3 HEENT nontraumatic normocephalic EOMI PERRLA sclera anicteric  Neck; Supple no JVD   Cardiovascular; regular rate and rhythm with S1-S2 no murmur rub or gallop  Pulmonary; clear bilaterally Abdomen; soft nondistended bowel sounds active, she is tender in the hypogastrium no guarding no rebound no palpable mass or hepatosplenomegaly  Rectal; not done  Skin; warm and dry no jaundice  Psych; anxious but mood and affect appropriate.        Assessment & Plan:  #39 40 year old female status post cholecystectomy with 4 month history of intermittent epigastric pain radiating to the back associated with nausea and weight loss of about 10 pounds . Rule out biliary dyskinesia, ampullary stenosis . Plan; Since patient had another episode of pain last night Will repeat labs today including liver function tests and lipase Patient is encouraged to push fluids and soft bland diet Continue Levsin sublingual but she is asked to use this on a more scheduled basis to try to attempt to prevent episodes of pain Percocet 5/325 by mouth every 6 hours when necessary severe pain #25 no refills. This is given to try to avoid further emergency room visits Will discuss with Dr. Arlyce Dice on his return. Have discussed  potential ERCP with the patient procedure was discussed in detail and she is agreeable, we'll tentatively schedule with Dr. Arlyce Dice with propofol.

## 2011-03-03 ENCOUNTER — Encounter: Payer: Self-pay | Admitting: Gastroenterology

## 2011-03-03 ENCOUNTER — Telehealth: Payer: Self-pay

## 2011-03-03 NOTE — Telephone Encounter (Signed)
Message copied by Chrystie Nose on Fri Mar 03, 2011 10:04 AM ------      Message from: [place], [place]      Created: Fri Mar 03, 2011  9:42 AM       PLEASE LET [name] KNOW HER LABS ARE NORMAL. I WILL DISCUSS ERCP WITH DR. KAPLAN

## 2011-03-03 NOTE — Telephone Encounter (Signed)
Pt aware of results per Kiernan Esterwood PA. 

## 2011-03-06 ENCOUNTER — Telehealth: Payer: Self-pay

## 2011-03-06 NOTE — Telephone Encounter (Signed)
Message copied by Chrystie Nose on Mon Mar 06, 2011  9:56 AM ------      Message from: [place], [place]      Created: Mon Mar 06, 2011  9:22 AM       Bonita Quin, I discussed ercp with Dr. Arlyce Dice, and he wants to proceed with ercp on [name]. Please let her know . It is already scheduled for 5/9 at [facility], thanks

## 2011-03-06 NOTE — Telephone Encounter (Signed)
Spoke with pt and she is aware of Dr. Marzetta Board recommendations.

## 2011-03-07 DIAGNOSIS — T8859XA Other complications of anesthesia, initial encounter: Secondary | ICD-10-CM

## 2011-03-07 HISTORY — DX: Other complications of anesthesia, initial encounter: T88.59XA

## 2011-03-13 ENCOUNTER — Ambulatory Visit (HOSPITAL_COMMUNITY)
Admission: RE | Admit: 2011-03-13 | Discharge: 2011-03-13 | Disposition: A | Discharge status: Home / Self Care | Payer: 59 | Source: Ambulatory Visit | Attending: Gastroenterology | Admitting: Gastroenterology

## 2011-03-14 ENCOUNTER — Ambulatory Visit (HOSPITAL_COMMUNITY): Payer: 59

## 2011-03-14 ENCOUNTER — Other Ambulatory Visit: Payer: 59 | Admitting: Gastroenterology

## 2011-03-14 ENCOUNTER — Inpatient Hospital Stay (HOSPITAL_COMMUNITY)
Admission: RE | Admit: 2011-03-14 | Discharge: 2011-03-17 | DRG: 393 | Disposition: A | Discharge status: Home / Self Care | Payer: 59 | Source: Ambulatory Visit | Attending: Gastroenterology | Admitting: Gastroenterology

## 2011-03-14 DIAGNOSIS — Y849 Medical procedure, unspecified as the cause of abnormal reaction of the patient, or of later complication, without mention of misadventure at the time of the procedure: Secondary | ICD-10-CM | POA: Diagnosis present

## 2011-03-14 DIAGNOSIS — K838 Other specified diseases of biliary tract: Secondary | ICD-10-CM | POA: Diagnosis present

## 2011-03-14 DIAGNOSIS — Y9210 Unspecified residential institution as the place of occurrence of the external cause: Secondary | ICD-10-CM | POA: Diagnosis present

## 2011-03-14 DIAGNOSIS — K929 Disease of digestive system, unspecified: Principal | ICD-10-CM | POA: Diagnosis present

## 2011-03-14 DIAGNOSIS — F988 Other specified behavioral and emotional disorders with onset usually occurring in childhood and adolescence: Secondary | ICD-10-CM | POA: Diagnosis present

## 2011-03-14 DIAGNOSIS — R945 Abnormal results of liver function studies: Secondary | ICD-10-CM

## 2011-03-14 DIAGNOSIS — F17200 Nicotine dependence, unspecified, uncomplicated: Secondary | ICD-10-CM | POA: Diagnosis present

## 2011-03-14 DIAGNOSIS — R1011 Right upper quadrant pain: Secondary | ICD-10-CM

## 2011-03-14 DIAGNOSIS — I10 Essential (primary) hypertension: Secondary | ICD-10-CM | POA: Diagnosis present

## 2011-03-14 DIAGNOSIS — R109 Unspecified abdominal pain: Secondary | ICD-10-CM

## 2011-03-14 DIAGNOSIS — K828 Other specified diseases of gallbladder: Secondary | ICD-10-CM | POA: Diagnosis present

## 2011-03-14 DIAGNOSIS — K859 Acute pancreatitis without necrosis or infection, unspecified: Secondary | ICD-10-CM | POA: Diagnosis present

## 2011-03-14 DIAGNOSIS — F319 Bipolar disorder, unspecified: Secondary | ICD-10-CM | POA: Diagnosis present

## 2011-03-14 DIAGNOSIS — J954 Chemical pneumonitis due to anesthesia: Secondary | ICD-10-CM | POA: Diagnosis present

## 2011-03-14 HISTORY — PX: ERCP W/ METAL STENT PLACEMENT: SHX1521

## 2011-03-14 LAB — CBC
HCT: 43.6 % (ref 36.0–46.0)
Hemoglobin: 14.7 g/dL (ref 12.0–15.0)
MCV: 93 fL (ref 78.0–100.0)
RBC: 4.69 MIL/uL (ref 3.87–5.11)
WBC: 13.8 10*3/uL — ABNORMAL HIGH (ref 4.0–10.5)

## 2011-03-14 LAB — HEPATIC FUNCTION PANEL
ALT: 343 U/L — ABNORMAL HIGH (ref 0–35)
AST: 969 U/L — ABNORMAL HIGH (ref 0–37)
Albumin: 3.9 g/dL (ref 3.5–5.2)
Alkaline Phosphatase: 127 U/L — ABNORMAL HIGH (ref 39–117)
Total Bilirubin: 0.7 mg/dL (ref 0.3–1.2)

## 2011-03-14 LAB — DIFFERENTIAL
Basophils Absolute: 0 10*3/uL (ref 0.0–0.1)
Lymphocytes Relative: 14 % (ref 12–46)
Lymphs Abs: 1.9 10*3/uL (ref 0.7–4.0)
Neutro Abs: 11.3 10*3/uL — ABNORMAL HIGH (ref 1.7–7.7)

## 2011-03-14 LAB — LIPASE, BLOOD: Lipase: 126 U/L — ABNORMAL HIGH (ref 11–59)

## 2011-03-15 ENCOUNTER — Ambulatory Visit (HOSPITAL_COMMUNITY): Payer: 59

## 2011-03-15 DIAGNOSIS — K859 Acute pancreatitis without necrosis or infection, unspecified: Secondary | ICD-10-CM

## 2011-03-15 LAB — LIPASE, BLOOD: Lipase: >3000 U/L — ABNORMAL HIGH (ref 11–59)

## 2011-03-15 LAB — CBC
HCT: 36.6 % (ref 36.0–46.0)
Hemoglobin: 12.3 g/dL (ref 12.0–15.0)
MCHC: 33.6 g/dL (ref 30.0–36.0)
MCV: 93.4 fL (ref 78.0–100.0)

## 2011-03-15 LAB — DIFFERENTIAL
Basophils Absolute: 0 10*3/uL (ref 0.0–0.1)
Basophils Relative: 0 % (ref 0–1)
Eosinophils Relative: 3 % (ref 0–5)
Lymphocytes Relative: 36 % (ref 12–46)
Monocytes Relative: 5 % (ref 3–12)
Neutro Abs: 4.6 10*3/uL (ref 1.7–7.7)

## 2011-03-15 LAB — COMPREHENSIVE METABOLIC PANEL
ALT: 244 U/L — ABNORMAL HIGH (ref 0–35)
Alkaline Phosphatase: 106 U/L (ref 39–117)
BUN: 4 mg/dL — ABNORMAL LOW (ref 6–23)
CO2: 28 mEq/L (ref 19–32)
Calcium: 8.5 mg/dL (ref 8.4–10.5)
GFR calc non Af Amer: >60 mL/min (ref >60)
Glucose, Bld: 103 mg/dL — ABNORMAL HIGH (ref 70–99)
Sodium: 139 mEq/L (ref 135–145)

## 2011-03-16 DIAGNOSIS — K859 Acute pancreatitis without necrosis or infection, unspecified: Secondary | ICD-10-CM

## 2011-03-16 LAB — CBC
Platelets: 178 10*3/uL (ref 150–400)
RDW: 13.6 % (ref 11.5–15.5)
WBC: 6.6 10*3/uL (ref 4.0–10.5)

## 2011-03-16 LAB — HEPATIC FUNCTION PANEL
AST: 121 U/L — ABNORMAL HIGH (ref 0–37)
Albumin: 3.5 g/dL (ref 3.5–5.2)
Total Protein: 6.1 g/dL (ref 6.0–8.3)

## 2011-03-16 LAB — DIFFERENTIAL
Basophils Absolute: 0 10*3/uL (ref 0.0–0.1)
Eosinophils Absolute: 0.4 10*3/uL (ref 0.0–0.7)
Eosinophils Relative: 5 % (ref 0–5)

## 2011-03-17 ENCOUNTER — Other Ambulatory Visit: Payer: Self-pay | Admitting: Gastroenterology

## 2011-03-17 DIAGNOSIS — R7989 Other specified abnormal findings of blood chemistry: Secondary | ICD-10-CM

## 2011-03-17 LAB — LIPASE, BLOOD: Lipase: 292 U/L — ABNORMAL HIGH (ref 11–59)

## 2011-03-17 LAB — AMYLASE: Amylase: 275 U/L — ABNORMAL HIGH (ref 0–105)

## 2011-03-21 NOTE — Assessment & Plan Note (Signed)
[facility]                                 ON-CALL NOTE   NAME:[name], [name]                            MRN:          [id number]  DATE:03/26/2007                            DOB:          12/12/1970    DATE:  Mar 26, 2007, 5:35 p.m.   Telephone number [phone].   GASTROENTEROLOGIST:  Vania Rea. Jarold Motto, MD, [facility], FAGA   Ms. Craigo complains of mid sternal pain that increases with swallowing.  She began having mid sternal chest pain with a spasm sensation that  began this morning after taking 3 Prozac capsules.  She states the  symptoms persisted and she presented to the ED, and she has just  undergone evaluation in the ED where x-rays were done and states she  was advised to call Dr. Norval Gable office for followup.  She is calling  now complaining that her symptoms are still present.  She does not feel  like she had any relief from the GI cocktail given in the ED and she has  no prescriptions from the ED, and she would like further advice.  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  I advised her that,  given her ongoing pain and the lack of relief with the GI cocktail, and  the fact that our office is now closed, she should return to the ED for  additional evaluation this evening.  She could have a pill ulcer, GERD  or a stricture. I could come see her after the the EDP re-evaluated her.  She is reluctant to return to the ED and would simply like additional  advice from me over the phone.  I advised her that, given her ongoing  pain and the fact it did not respond to the GI cocktail, I was uncertain  as to the cause of her symptoms and felt it would to her best advantage  to be evaluated in the ED again. She is advised to remain on clear  liquids until she is re-evaluated. She should be seen in the office in  followup with Dr. Jarold Motto as well.     Venita Lick.  Russella Dar, MD, [facility]  Electronically Signed    MTS/MedQ  DD: 03/26/2007  DT: 03/26/2007  Job #: [id number]   cc:   Vania Rea. Jarold Motto, MD, [facility], FAGA

## 2011-03-21 NOTE — Op Note (Signed)
NAMEJESSAH, [name]                   ACCOUNT NO.:  [id number]   MEDICAL RECORD NO.:  [id number]          PATIENT TYPE:  AMB   LOCATION:  DAY                          FACILITY:  [facility]   PHYSICIAN:  Excell Seltzer. Annabell Howells, M.D.    DATE OF BIRTH:  1971/01/31   DATE OF PROCEDURE:  06/11/2007  DATE OF DISCHARGE:                               OPERATIVE REPORT   PROCEDURE:  Cystoscopy, bilateral retrograde pyelograms, right flexible  ureteroscopy, insertion of right double-J stent.   PREOPERATIVE DIAGNOSIS:  Chronic right flank pain in a patient with a  history of stones and intermittent gross hematuria.   POSTOPERATIVE DIAGNOSIS:  Chronic right flank pain in a patient with a  history of stones and intermittent gross hematuria.   SURGEON:  Dr. Bjorn Pippin.   ANESTHESIA:  General.   SPECIMEN:  None.   DRAIN:  6-French 26 cm double-J stent.   COMPLICATIONS:  None.   INDICATIONS:  [name] is a 40 year old white female with a history of stones  who has had chronic right flank pain and intermittent gross hematuria  with multiple negative CT scans.  She is being evaluated by Dr. Paula Libra for her back pain and has MRI scheduled.  Because of the gross  hematuria and persistent pain, it was felt that cystoscopy and  retrograde pyelography were indicated.   FINDINGS/PROCEDURE:  The patient was taken to the operating room where  general anesthetic was induced.  She was placed in lithotomy position.  Perineum and genitalia were prepped with Betadine solution.  She was  given Cipro.  Cystoscopy was performed in the usual fashion with a 16-  [place] flexible scope.  Examination revealed a normal urethra.  The  bladder wall was smooth with increased vascularity but no tumors or  stones were noted.  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   The right ureteral orifice was cannulated with 5-French  open-end  catheter and contrast, this was filled in retrograde fashion.  This  demonstrated a normal ureter and internal collecting system.   Right retrograde pyelogram demonstrated a normal right ureter and  internal collecting system.   The left ureteral orifice was then cannulated with a 5-French catheter  and contrast was instilled.   Left retrograde pyelogram demonstrated normal ureter and internal  collecting system.   Because of persistence of chronic pain and intermittent gross hematuria,  I felt that ureteroscopic inspection of the right kidney was indicated.  This was done using the 6-French flexible scope after passage of a wire  to the kidney.  The scope was easily advanced to the kidney.  Inspection  of the internal collecting system revealed some Randall's plaques, but  no other abnormalities or obvious indication of recent active bleeding.  Inspection of the ureter on withdrawal of the scope revealed no  significant abnormalities.   At this point, the cystoscope was reinserted, a wire was reinserted in  the kidney and a 6-French 24 cm double-J stent was passed over  the wire  to the kidney under fluoroscopic guidance.  The wire was removed leaving  good coil in the kidney and good coil in the bladder.   The bladder was then drained.  The patient was taken down from lithotomy  position.  Her anesthetic was reversed.  She was  moved to the recovery  room in stable condition.   FINDINGS:  Increased vascularity of the bladder with changes consistent  with cystitis glandularis.  This may be responsible for hematuria,  although it is difficult to say.  No obvious renal abnormalities were  noted other than some Randall's plaques on the papilla and stone former.  She will be given Percocet and Pyridium and will see me in two weeks for  office stent removal.      Excell Seltzer. Annabell Howells, M.D.  Electronically Signed     JJW/MEDQ  D:  06/11/2007  T:  06/11/2007  Job:   [id number]   cc:   Jene Every, M.D.  Fax: [phone]

## 2011-03-24 ENCOUNTER — Telehealth: Payer: Self-pay | Admitting: Gastroenterology

## 2011-03-24 NOTE — H&P (Signed)
[name], [name]                   ACCOUNT NO.:  [id number]   MEDICAL RECORD NO.:  [id number]          PATIENT TYPE:  INP   LOCATION:  0304                          FACILITY:  [facility]   PHYSICIAN:  Geoffery Lyons, M.D.      DATE OF BIRTH:  09-14-1971   DATE OF ADMISSION:  10/20/2005  DATE OF DISCHARGE:                         PSYCHIATRIC ADMISSION ASSESSMENT   This is a voluntary admission to the services of Dr. Geoffery Lyons.   IDENTIFYING INFORMATION:  This is a 40 year old single white female.  She  presented to the emergency department last evening at [facility] requesting  detoxification for alcohol abuse.  Her initial alcohol level was 290; it  came down to 217.  She states she became more depressed a few weeks ago and  started drinking.  In the emergency room she stated that she had an alcohol  abuse history of 15 years; however, when she was last here  September 9 to  July 19, 2005 she presented at that time abusing alcohol as well as  cocaine and was actively suicidal.  At that time her alcohol level was less  than 5, but her drug screen was positive for cocaine and benzos.   PAST PSYCHIATRIC HISTORY:  She reports having been an inpatient at [facility] in [place] [facility] [place] in [place].  [facility] and [facility] in the  past as well as [facility] [place] in September 2004 and November  2005.   SOCIAL HISTORY:  She states that she has an associate degree in criminology  and was employed in that regard until February.  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  She currently lives with her grandmother along  with her two daughter, ages 19 and 65.  She is currently looking for work.   FAMILY HISTORY:  A cousin committed suicide 4-5 years ago from substance  abuse.  Her grandfather, father and aunt all have substance abuse.  Her  mother and father have depression.   ALCOHOL AND DRUG HISTORY:  She reports starting to drink alcohol  approximately a month  ago.   PRIMARY CARE Anelisse Jacobson:  Medical Caela Huot is Dr. Antionette Char at [facility].   MEDICAL HISTORY:  She is followed for migraine headaches.   MEDICATIONS:  She acknowledges that she continued the medications that she  had at discharge.  1.  Prozac, she states she is now up to 60 mg daily.  2.  Seroquel 100 mg at 8:00 p.m.  3.  Risperdal 0.5 mg at 8:00 p.m.  4.  Eskalith 450 mg at 8:00 p.m.   DRUG ALLERGIES:  TEGRETOL, she states she gets itches and breaks out.   PHYSICAL EXAMINATION:  On admission her vital signs were:  She is 66 inches  tall, weight is 133, temperature 97.8, blood pressure 119/80 to 121/90,  pulse 97-101, respirations 18.  She does have a moderate tremor, and she  does appear to feel ill.  She states that she does have a history for  withdrawal seizures, and she is being  observed closely for that.  The  remainder of her physical exam was unremarkable and is as per the ER.   MENTAL STATUS EXAM:  She is alert.  She is a little slowed in her motor.  She can walk, however.  Her speech is slow.  Her mood, she states that she  has ups and downs.  Her affect shows a normal range.  Her thought process is  somewhat clear and linear.  She wants to get better.  Judgment and insight  are poor.  Concentration and memory are intact.  Intelligence is at least  average.  She denies suicidal or homicidal ideation.  She denies auditory or  visual hallucinations.   ADMISSION DIAGNOSES:  AXIS I:  Alcohol abuse for the past month.  She has a  history for polysubstance abuse.  AXIS II:  Deferred.  AXIS III:  Reports alcohol withdrawal seizures.  AXIS IV:  Family stressors.  AXIS V:  30.   PLAN:  Admit for safety and stabilization.  Toward that end we will begin  the alcohol detox protocol using the Librium protocol.  Her medications will  be adjusted.  She will have her Eskalith CR increased to 450 a.m. and h.s.,  and we will give her some Fioricet to help abort  her migraine headache.  Dr.  Dub Mikes will be doing further adjustments on her daily medications as  indicated.      Mickie Leonarda Salon, P.A.-C.      Geoffery Lyons, M.D.  Electronically Signed    MD/MEDQ  D:  10/21/2005  T:  10/21/2005  Job:  [id number]

## 2011-03-24 NOTE — Assessment & Plan Note (Signed)
[facility]                                 ON-CALL NOTE   NAME:[name], [name]                          MRN:          [id number]  DATE:01/30/2007                            DOB:          19-Jan-1971    Primary gastroenterologist is Dr. Vania Rea. Jarold Motto.   [name] called earlier tonight with symptoms of food impaction.  Please see  my previous dictation.  I reviewed her previous records and the  endoscopy report by Dr. Arlyce Dice and it showed that she did not have overt  food impaction with that episode, nor did she have food impaction with  Dr. Sabino Gasser 's procedure.  Both of these were done in the past 5  days.  With this in mind, I arranged for her to have an upper GI series  when she got to the emergency room.  The radiologist called me back and  told me that her esophagus was normal.  There was no food impaction.  Her stomach did not coat completely normally, but the contrast emptied  from her stomach quickly, implying that she has no gastric outlet  obstruction.  I called the emergency room staff and asked them to have  the patient stay until I was able to talk with her about her symptoms,  to go over them with her and to arrange for followup.  I got to the  emergency room within 10-15 minutes of her finishing the esophagram and  she had left the ER without signing out.  I was therefore unable to  discuss her symptoms with her, nor arrange for followup.     [name], MD  Electronically Signed    DPJ/MedQ  DD: 01/29/2007  DT: 01/30/2007  Job #: [id number]   cc:   Vania Rea. Jarold Motto, MD, Clementeen Graham, FACP, FAGA  Barbette Hair. Arlyce Dice, MD,FACG

## 2011-03-24 NOTE — Discharge Summary (Signed)
[name], [name]                   ACCOUNT NO.:  [id number]   MEDICAL RECORD NO.:  [id number]          PATIENT TYPE:  INP   LOCATION:  6733                         FACILITY:  [facility]   PHYSICIAN:  Hansel Feinstein, M.D.DATE OF BIRTH:  01-28-1971   DATE OF ADMISSION:  09/06/2005  DATE OF DISCHARGE:  09/07/2005                                 DISCHARGE SUMMARY   DISCHARGE DIAGNOSES:  1.  Bipolar disorder with predominantly depressed features.  2.  Alcohol abuse.  3.  Drug abuse.   DISCHARGE MEDICATIONS:  1.  Seroquel 100 mg one p.o. nightly.  2.  Lithium 450 mg one p.o. nightly.  3.  Risperdal 0.5 mg nightly.  4.  Prozac 10 mg daily.  5.  Thiamine 100 mg daily.  6.  Folic acid 1 mg daily.  7.  Voltaren 75 mg p.o. b.i.d.  8.  Phenobarbital taper as follows: 30 mg p.o. q.6h. x1 days, then      phenobarbital 30 mg p.o. b.i.d. x1 day, then phenobarbital 15 mg q.8h.      x1 day, then phenobarbital 15 mg p.o. b.i.d. x1 day, then stop taper.   CONSULTATIONS:  None.   HISTORY OF PRESENT ILLNESS:  See dictated H&P for full details.  Briefly,  Ms. Tarleton is a 40 year old female with history of bipolar disorder and  polysubstance abuse who was recently hospitalized at [facility] from September 01, 2005, to September 06, 2005.  She was discharged on  the morning of September 06, 2005, to [facility] as she was found  to be suicidal during her previous admission.  She presented on September 06, 2005, to [facility] and was found not to be suicidal and did not meet  admission criteria for [facility].  She went home that day and  began to feel somewhat tremulous and feelings that her skin was crawling and  came back to [facility] to the floor that she was on  during her previous admission without going to the ED.  Her nurse from  previous hospitalization called Dr. Emilia Beck.  He evaluated her and found  her not to be suicidal,  however, she was very worried about withdrawing from  alcohol and although she denied chest pain, shortness of breath,  diaphoresis, difficulty swallowing, any change in bowel or bladder or any  hallucinations, delusions or headaches, she was admitted for 24-hour  observation.   HOSPITAL COURSE:  PROBLEM #1 -  BIPOLAR DISORDER WITH PREDOMINANTLY  DEPRESSED FEATURES:  Ms. Resh was continued on all of her home medications  including Seroquel, Lithium, Risperdal, Prozac, thiamine, folic acid,  Voltaren.  She was not suicidal or homicidal at any point during this  admission.  She expressed insight to her situation and felt safe going home.  She stated that she wants to live for her kids.  She has a follow-up  appointment with mental health with Lavada Mesi on October 20, 2005.  She  will follow up with her bipolar disorder at that time.   PROBLEM #  2 -  ALCOHOL AND DRUG ABUSE:  On admission, the patient reported  some tremulousness and the sensation of her skin crawling.  She was very  concerned about alcohol withdrawal.  She denied any chest pain, shortness of  breath, diaphoresis, dysphagia, change in bowel or bladder habits,  hallucinations or headaches.  she was found on exam to have a slight tremor,  otherwise, exam was normal.  She was continued on Ativan 1 mg b.i.d., that  was the end of the taper that was started during the previous  hospitalization.  Her vitals remained stable as follows throughout the  hospitalization.  Her heart rate was 57 to 89, blood pressure 97 to 129/68  to 95.  Her T-max was 99.1, respiratory rate 16 to 22 and she was 97 to 100%  on room air.  On exam she was found to have a slight tremor, otherwise exam  was within normal limits.  CBC, BMP and LFTs were found to be within normal  limits.  In light of all of her normal laboratory data, normal exam and  stable vital signs, and the fact that her last drink was last Friday, we  were not concerned about DTs.   The patient expressed good insight and the  desire to stop alcohol abuse and polysubstance abuse.  We encouraged the  patient to attend AA meetings.  The patient reports having a sponsor and  desire to quit for her family.  She will follow up with Lavada Mesi at  mental health on October 20, 2005.  For her withdrawal symptoms of  tremulousness, she was given a prescription for phenobarbital taper.   DISCHARGE INSTRUCTIONS:  Patient was instructed to continue her home  medications for her bipolar disorder.  She was given instructions for  phenobarbital taper for her alcohol withdrawal symptoms.  She was instructed  to continue taking thiamine and folate.   FOLLOW UP:  Patient is to follow up with Lavada Mesi at mental health on  October 20, 2005.      Hansel Feinstein, M.D.     PM/MEDQ  D:  09/07/2005  T:  09/08/2005  Job:  [id number]

## 2011-03-24 NOTE — Assessment & Plan Note (Signed)
[facility]                                 ON-CALL NOTE   NAME:Scaff, Aloura E                          MRN:          [id number]  DATE:02/05/2007                            DOB:          Aug 22, 1971    Mallika is seen in the emergency department.  She had called the office  today complaining of a pill being stuck in her esophagus.  She had taken  her medicines this morning and now tells [place] she also took a bacon  biscuit.  Since that time, she has had sensation of pressure in her  chest similar to previous episodes of what were thought to be food  impactions.  However, on January 24, 2007, she had an EGD without any  foreign body retained, though she had a dilation for what was perceived  to be a stricture in the distal esophagus.  Subsequently, she presented  with the same problems and another EGD on January 27, 2007 showed food in  the stomach and no esophageal problems.  Subsequent to that she had an  upper GI series on her 3rd episode of feeling this way on March 25 and  there was no evidence of esophageal obstruction.  She complains of dull  chest pressure which is vague in character.  She feels like something is  stuck there.  The PA in the emergency department has evaluated her and  called me, as the patient says she could not drink.  She would take  water into her mouth and spit it out; I observed this too; she did that  twice.  When I told her she really was not trying to swallow, she said  she could not, she felt like things were going to come back up.  She  eventually did swallow water and keep it down and I watched that.  She  is not hyper-salivating.   PHYSICAL EXAMINATION:  She is not uncomfortable in any way on exam.  ABDOMEN:  Nontender.  CHEST WALL:  Nontender.  HEART:  Sounds are normal.  LUNGS:  Clear.  She appears alert and oriented x3.   There is a history of narcotic and alcohol abuse; she has been sober.  She has been hospitalized for  detox for that before.  She is asking for  more pain medication while she is here.   ASSESSMENT:  Foreign body sensation.  It seems extremely unlikely that  there is any foreign body in the esophagus at this time, given her  situation; that is, she has had these multiple endoscopies including one  in 2007 for dysphagia (she did not recall that) which was normal without  any evidence for esophageal obstruction or stricture at that time.  I  think this may be a somatization problem.  It is not entirely clear, but  I do not think endoscopy is indicated again.  I explained this to her  and she was somewhat unhappy with this explanation.  We are going to try  to give her some Ativan to see if that may help.  She is on Thorazine,  trazodone and Prozac.  She has also recently started Reglan.  She has  been on these medicines for some time.  I suppose Thorazine could  potentially cause this.  The trazodone certainly may cause delayed  gastric emptying, as Thorazine could.  She needs to follow up with Dr.  Earlene Plater and she probably needs to get psychiatry followup.   If these problems persist, then another Gastrografin swallow or barium  swallow would make the most sense to start I think rather than an  endoscopy.   NOTE:  Because of noncompliance issues, my partner, who had been caring  for her, has discharged her from our practice and she is in the 30-day  window for emergency care.     Iva Boop, MD,FACG  Electronically Signed    CEG/MedQ  DD: 02/05/2007  DT: 02/06/2007  Job #: [id number]   cc:   Louanna Raw

## 2011-03-24 NOTE — Discharge Summary (Signed)
[name], [name]                   ACCOUNT NO.:  [id number]   MEDICAL RECORD NO.:  [id number]          PATIENT TYPE:  IPS   LOCATION:  0599                          [facility]ACILITY:  [facility]   PHYSICIAN:  Jeanice Lim, M.D. DATE OF BIRTH:  06/24/1971   DATE OF ADMISSION:  11/03/2005  DATE OF DISCHARGE:  11/09/2005                                 DISCHARGE SUMMARY   IDENTIFYING DATA:  This is a 40 year old divorced Caucasian female,  voluntarily admitted to [facility] 11 days ago for alcohol  detox, started drinking immediately after discharge and then drinking  escalated to a fifth a day one week prior to this admission.  Had been  blacking out every time that she drank.  Had a fight with her grandmother  last night which she does not recall.  Apparently eviction paperwork may  have been taken out.  The patient was seeking detox and has gained 15 pounds  since September.  Mood is depressed, reports some racing thoughts.  Had a  period of sobriety between the years of 1999 and 2001 by attending AA  frequently and using sponsor.   PAST PSYCHIATRIC HISTORY:  [facility] earlier this month in  December 2006 and September 2006, both times for alcohol dependence, as well  as [facility] in September 2005, went to [facility] in 2001  but only stayed for 3 days, stayed 5 days in [name] in 2001,  leaving early, and was in [facility] for 14 days in 2004, also at [facility] in 2004 where she stayed for a full 28 days.  Outpatient at  [facility] and sees Clement Sayres for  counseling.   ALCOHOL AND DRUG HISTORY:  Began drinking at age 37, drinks up to and over a  fifth per day, with a history of DTs and blackouts.  Had used cocaine 3  times since her last discharge 11 days ago.   PAST MEDICAL HISTORY:  Primary care Yeiren Whitecotton is Dr. Antionette Char in  [facility].  Medical problems include migraines  for one month  and history of seizures while withdrawing from alcohol in the past.   ADMISSION MEDICATIONS:  Seroquel, Prozac, Thorazine, and lithium.   ALLERGIES:  TEGRETOL and TOPAMAX, both of which caused hives.   PHYSICAL AND NEUROLOGICAL EXAMINATION:  Essentially within normal limits.   ROUTINE ADMISSION LABS:  Elevated platelet count at 475, otherwise within  normal limits.  Slightly elevated glucose at 123.  TSH elevated at 7.99,  questionable hypothyroidism and lithium level was low at 0.34.  Alcohol  level was 155.  Urine drug screen is pending.   MENTAL STATUS EXAM:  Alert and oriented x4, good eye contact, blunt affect,  casual, calm and cooperative.  Speech was quiet, even tone and volume.  Mood  depressed.  Thought process goal directed without suicidal or homicidal  ideation, no evidence of auditory or visual hallucinations or mania  observed.  Cognition grossly intact.  Judgment and insight fair, motivated  to be detoxed  and remain sober by the patient's report.   ADMISSION DIAGNOSES:  AXIS I:  Bipolar disorder, not otherwise specified;  alcohol dependence; rule out substance-induced mood disorder and DTs.  AXIS II:  Deferred.  AXIS III:  Migraine headaches.  AXIS IV:  Moderate to severe, problems with primary support group,  occupation, housing, economic and legal problems.  AXIS V:  34/60.   HOSPITAL COURSE:  The patient was admitted and ordered routine p.r.n.  medications, underwent further monitoring, and was encouraged to participate  in individual, group and milieu therapy.  Was placed on Librium detox  protocol for safe detox and stabilized on medications after medication  reconciliation, medications were resumed to stabilize mood and TSH was  repeated and the patient was monitored medically with recommendations  followed.  Pain was controlled with Percocet and Phenergan for episodic  migraine, Diflucan given.  The patient was discharged in significantly   improved condition.  Mood was euthymic, affect bright.  There was no mood  lability, no dangerous ideation including suicidal or homicidal thoughts, no  psychotic symptoms and the patient was tolerating medications without side  effects including EPS, TD, sedation or tremor.  The patient was given  medication education at the time of discharge and discharged on:  1.  Lithobid CR 300 mg in the morning and 2 at 8 p.m.  2.  Seroquel 200 mg at 8 or 9 p.m.  3.  Thorazine 50 mg up to 3 times a day p.r.n.  4.  Magic Mouthwash 15 mL, swish and swallow every 4 hours while awake, x10      days and then stop.  5.  Risperdal 1 mg q.8 p.m.  6.  Prozac 20 mg 2 in the morning.  7.  Diflucan 100 mg, 14 tabs were prescribed, to take 1 tab by mouth daily      for 2 weeks and then stop.   DISPOSITION:  The patient was to bring medications to [facility], going to  [facility] for 14 days in [place], and follow up  was set up for January 5 at 9 a.m. with Dr. Annabell Howells.   DISCHARGE DIAGNOSES:  AXIS I:  Bipolar disorder not otherwise specified and  alcohol dependence, rule out substance-induced mood disorder and DT's.  AXIS II:  Deferred.  AXIS III:  Migraine headaches.  AXIS IV:  Moderate to severe, problems with primary support group,  occupation, housing, economic and legal problems.  AXIS V:  Global assessment of functioning on discharge was 55.      Jeanice Lim, M.D.  Electronically Signed     JEM/MEDQ  D:  12/09/2005  T:  12/10/2005  Job:  [id number]

## 2011-03-24 NOTE — H&P (Signed)
[name], [name]                   ACCOUNT NO.:  [id number]   MEDICAL RECORD NO.:  [id number]          PATIENT TYPE:  IPS   LOCATION:  0304                          FACILITY:  [facility]   PHYSICIAN:  Geoffery Lyons, M.D.      DATE OF BIRTH:  05/03/1971   DATE OF ADMISSION:  05/11/2006  DATE OF DISCHARGE:                         PSYCHIATRIC ADMISSION ASSESSMENT   IDENTIFYING STATEMENT:  This is a 40 year old, divorced, white female.  She  states that she relapsed on drugs approximately 2 weeks ago.  She was last  with [place] in January.  She states that Dr. Gwyndolyn Kaufman of the [facility] stopped  all of her discharge meds in February, and she did well until 2 weeks ago.  She states she is not sure what the trigger was that caused her to relapse;  however, she noticed that depression was coming on and that it became  unbearable.  She states that she did not do her part.  She quit going to  NA/AA meetings and quit calling her sponsor.   PAST PSYCHIATRIC HISTORY:  She was here last December 29 to January 4th.  She had been here approximately 11 days prior to that.  She also has been in  [facility] in September 2005, [facility] in 2001, [place] [facility] __________  also in 2001, [place] in 2004, [facility] in 2004, and outpatient at [facility].  She states  that she began drinking at age 52.  She drinks up to and over a 5th per day.  She has a history for DTs and blackouts.  Currently, she states that she has  been using 5 bags of heroin a day for the last 2 weeks, alcohol a 5th a day  for the last 2 weeks, and intermittent crack use.   SOCIAL HISTORY:  She has an associates degree in Patent examiner.  She has  been divorced twice.  Her 66 year old daughter lives with her grandmother  and her 66 year old daughter lives with that child's father.  She was  running [employer] until last Thursday and now she is worried that  she has lost her  employment.   FAMILY HISTORY:  She states her maternal aunt and uncle, one is actively  using and one is in recovery, and she states she had a cousin and uncle who  committed suicide.   ALCOHOL AND DRUG HISTORY:  Has already been stated.   PRIMARY CARE Naylea Wigington:  She uses Dr. Antionette Char at [facility] on  [address].   MEDICAL PROBLEMS:  Migraine headaches.  She states that she gets relief with  Dilaudid and Phenergan.   MEDICATIONS:  She has no currently prescribed medications.  When last  discharged from [facility], she was discharged on:  1.  Lithobid CR 300 mg in the morning and 2 at 8 p.m.  2.  Seroquel 200 mg at 8 or 9 p.m.  3.  Thorazine 50 mg up to 3 times a day p.r.n.  4.  Risperdal 1 mg at 8  p.m.  5.  Prozac 20 mg take 2 in the morning, etcetera.   DRUG ALLERGIES:  1.  TEGRETOL.  2.  TOPAMAX.  She breaks out and itches with both of these.   PHYSICAL EXAMINATION:  Her urine drug screen was positive for cocaine.  Her  alcohol level was 170.   VITAL SIGNS:  On admission to the unit show that she is 67 inches tall,  weighs 142, temperature is 96.6, blood pressure is 138/84, pulse was 125,  respirations are 20.   She was medically cleared over at [facility].  She had no  remarkable physical findings, specifically she is not in active withdrawal.  She had no other abnormalities that were remarkable on her lab work.   MENTAL STATUS EXAMINATION:  She is alert and oriented x3.  She is  appropriately groomed, dressed, and nourished.  Her speech was not  pressured.  Her mood was depressed and anxious, appropriate to the  situation.  Her affect was congruent.  Her thought processes are clear,  rational, and goal-oriented.  She requested that she be restarted on the  phenobarbital protocol as that has been more effective in the past.  Judgment and insight are intact.  She just needs to use them.  Concentration  and memory are intact.  She  denied being suicidal or homicidal.  She states  that she does see spiders, and she feels like something is crawling on her,  however, this is probably withdrawal from the heroin and she states that she  feels she is shaking on the inside.  She had no tremor at this time.   DIAGNOSES:   AXIS I:  Polysubstance dependence, recent relapse.  Bipolar, currently depressed.   AXIS II:  No diagnosis at this time.   AXIS III:  Migraines.   AXIS IV:  Issues with occupation and housing.   AXIS V:  Is 40-50.   PLAN:  1.  Admit for detox.  2.  Reestablish appropriate meds for mood stabilization.  3.  Get her reestablished with drug rehab followup.      Mickie Leonarda Salon, P.A.-C.      Geoffery Lyons, M.D.  Electronically Signed    MD/MEDQ  D:  05/12/2006  T:  05/12/2006  Job:  [id number]
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
## 2011-03-24 NOTE — Discharge Summary (Signed)
NAMESANVIKA, [name]                   ACCOUNT NO.:  [id number]   MEDICAL RECORD NO.:  [id number]          PATIENT TYPE:  IPS   LOCATION:  0500                          FACILITY:  [facility]   PHYSICIAN:  [name], M.D.      DATE OF BIRTH:  1971-03-19   DATE OF ADMISSION:  05/17/2006  DATE OF DISCHARGE:  05/20/2006                                 DISCHARGE SUMMARY   CHIEF COMPLAINT AND PRESENT ILLNESS:  This was one of multiple admissions to  [facility] for this 40 year old single white female.  She  was discharged the day before after treatment for alcohol and opioids.  She  was fully detoxed and planned to go to [facility].  She was  gone four hours from the unit, when we got a call that she was in the ED  having used opioids and alcohol, requesting admission.  She reported that  she went out, immediately relapsed, used 20 grams of heroin, snorting some  of it, shooting some in her arms, and drank a large amount of alcohol and  claimed she took approximately 700 mg of Seroquel.  Feeling suicidal when  she realized what she had done.   PAST PSYCHIATRIC HISTORY:  At least the third time on [facility].  Prior admission on December 29 through January 4.   ALCOHOL AND DRUG HISTORY:  Drinking up to a fifth of alcohol per day.  History of DTs, blackouts, as well as alcohol withdrawal seizures.  Using up  to five bags of heroin a day, with intermittent cocaine use.  She has been  at __________, [facility] of __________.   MEDICAL HISTORY:  Migraine headaches.   PHYSICAL EXAMINATION:  Physical exam performed and failed to show any acute  findings.   LABORATORY WORK-UP:  CBC:  White blood cells 8.3, hemoglobin 13.9.  Drug  screen negative for opioids, cocaine, and benzodiazepines.  Blood chemistry:  Sodium 136, potassium 3.9, glucose 93.  Liver enzymes:  SGOT 26, SGPT 25.   MENTAL STATUS EXAMINATION:  Upon admission,  revealed alert female,  disheveled, drowsy, having difficulty focusing.  Speech was normal in pace  and tone, decreased production.  Anxious throughout the process, minimizing  her problem with substances, having some passive suicidal thoughts.  Claims  that she was shooting heroin in the veins of her arms.  No track marks were  seen.   ADMISSION DIAGNOSES:  AXIS I:  Polysubstance dependence.  Mood disorder, not  otherwise specified.  AXIS II:  No diagnosis.  AXIS III:  Withdrawal seizures.  AXIS IV:  Moderate.  AXIS V:  Global Assessment of Functioning upon admission 38; highest Global  Assessment of Functioning in the last year 62.   COURSE IN HOSPITAL:  She was admitted.  She was placed on some clonidine and  some Librium for possible withdrawal.  She did say that she made the wrong  decision.  She left the unit with a relative who dropped her at a friend's  house.  She did  not go to the program she was supposed to go to.  She took  all the samples of Seroquel, claimed to take heroin, although drug screen  negative for opioids.  Claimed panic attacks.  She claimed that she has  taken Paxil and Zoloft successfully.  She could not take the Topamax or  Neurontin due to side effects, a rash.  She gained weight on the Depakote,  but she is willing to try the Depakote again.  She said she needed the help,  and she really wanted to work things out for her daughter.  She also did  take Thorazine on an as-needed basis successfully.  By the 14th, she claimed  that she felt down.  She was requesting opioids.  She was claiming that she  was hearing voices, seeing faces.  Her presentation was not compatible with  the symptoms she was claiming.  She was started on lithium and Geodon.  By  July 15, she claimed she was better.  She claimed she had a place to go.  She was going to be living with a friend until she can go __________ her  house.  She requested discharge.  She was in full contact with  reality.  There were no suicidal or homicidal ideas.  No hallucinations.  No  delusions.  We went ahead and discharged to outpatient follow-up.   DISCHARGE DIAGNOSES:  AXIS I:  Polysubstance dependence.  Mood disorder, not  otherwise specified.  AXIS II:  No diagnosis.  AXIS III:  Withdrawal seizures.  AXIS IV:  Moderate.  AXIS V:  Global Assessment of Functioning upon discharge 50.   DISCHARGE MEDICATIONS:  1. Trazodone 100 at bedtime.  2. Depakote 500 at bedtime.  3. Lithium 300 twice a day.  4. Geodon 60 twice a day.   FOLLOW-UP:  Follow up with the [facility].      [name], M.D.  Electronically Signed     IL/MEDQ  D:  06/11/2006  T:  06/12/2006  Job:  [id number]

## 2011-03-24 NOTE — H&P (Signed)
Denise, [name]                   ACCOUNT NO.:  [id number]   MEDICAL RECORD NO.:  [id number]          PATIENT TYPE:  IPS   LOCATION:  0500                          F[facility]ILITY:  [facility]   PHYSICIAN:  Geoffery Lyons, M.D.      DATE OF BIRTH:  [birth year]   DATE OF ADMISSION:  05/17/2006  DATE OF DISCHARGE:                         PSYCHIATRIC ADMISSION ASSESSMENT   IDENTIFYING INFORMATION:  This is a 40 year old single white female.  This  is a voluntary admission.   HISTORY OF PRESENT ILLNESS:  This patient was discharged from [facility] yesterday after treatment for alcohol and opiate  abuse.  She was fully detoxed, had planned to go to [facility], was gone approximately 4 hours from the unit when we got a call from  the emergency room that she was back there, having used opiates and alcohol  again and was requesting admission.  Today, she reports she went out,  immediately relapsed, used approximately 20 grams of heroin, snorting some  of it, shooting some in her arms and drank a large amount of alcohol and  took approximately 700 mg of Seroquel, feeling suicidal when she realized  what she had done.   PAST PSYCHIATRIC HISTORY:  This is at least the third admission to [facility].  The patient has a history of prior admission  here December 29 through November 09, 2005, and another admission in 2006.  She has a long history of alcohol abuse, drinking up to a 5th of alcohol per  day, has a history of DT's, blackouts, and also alcohol withdrawal seizures.  She has been using up to 5 bags of heroin a day for stretches at a time, and  drinking about a fifth of alcohol along with intermittent cocaine use.  She  has a history of prior admissions to [facility] treatment center in 2004,  [facility] in 2001, [address] [facility] [place] in 2001 and has  previously been an outpatient at [facility], says that  she began using substances around the age of 40.  Substance use prior to  that is unclear.   SOCIAL HISTORY:  The patient has an associate's degree in law enforcement,  twice divorced, has a 42 year old daughter who lives in the care of her  grandmother and an 62 year old daughter that lives with her father.  The  patient is unemployed.   FAMILY HISTORY:  The patient has a family history of substance abuse with a  maternal aunt and uncle with active substance abuse, and a cousin and an  uncle with history of suicide.   PAST MEDICAL HISTORY:  The patient is followed by Dr. Louanna Raw at  [facility] [address].  She has a history of migraine  headaches for which she requests a lot of Phenergan shots.  She is currently  prescribed no medications.  Previously at [facility] she had been  stabilized on Lithobid 300 mg in the morning, 600 mg at h.s., 200 mg of  Seroquel at  h.s.  Thorazine, Risperdal, and Prozac also have been part of  her previous regimen.   DRUG ALLERGIES:  She has a history of drug allergies to TEGRETOL and  TOPAMAX, which cause her to break out with itching.   POSITIVE PHYSICAL FINDINGS:  The patient's full physical examination was  done in the emergency room.  It is noted in the record.  She is a well-  nourished, well-developed female who is in no acute distress, 5 feet 7  inches tall, 150 pounds, temperature 97.7, pulse 97, respirations 20, blood  pressure 135/88, O2 saturation on room air at admission was 99%.  She is in  no acute distress, although disheveled, with poor hygiene.   DIAGNOSTIC STUDIES:  WBC 8.3, hemoglobin 13.9, hematocrit 41.2 and platelets  normal at 294,000.  Electrolytes: Sodium 136, potassium 3.9, chloride 100,  CO2 26.  BUN 7, creatinine 0.8, random glucose was 93.  Liver enzymes:  SGOT  26, SGPT 25, alkaline phosphatase 54, and total bilirubin at 0.5.  She was  in no distress in the emergency room but was mildly  sedated.  Urine  pregnancy test was negative. Acetaminophen level was negative.  Urine drug  screen was positive for barbiturates.  Alcohol level was 5.  In the  emergency room, urinalysis within normal limits.   MENTAL STATUS EXAM:  Fully alert female, disheveled, poor hygiene, a little  bit drowsy, having difficulty focusing.  Speech normal in pace and tone,  decreased production.  Mood depressed, anxious, thought process logical and  coherent.  Insight is adequate, minimizing her problems with substances,  having some passive suicidal thoughts today, claiming that she had been  shooting heroin in the veins of her arms yesterday but no track marks seen,  no evidence of homicidal thoughts..  Cognitively she is intact and oriented  x3.   ADMISSION DIAGNOSIS:  AXIS I:  Ethyl abuse, rule out dependence, opiate  abuse, rule out dependence, mood disorder not otherwise specified.  AXIS II:  Deferred.  AXIS III:  History of seizures with withdrawal.  AXIS IV:  Moderate problems with social functioning and substances.  AXIS V:  Current 38, past year 36.   INITIAL PLAN OF CARE:  Plan is to voluntarily admit the patient with q.15  minute checks in place.  We are going to place her on Librium 25 mg q.6h  p.r.n. for withdrawal symptoms and Clonidine q.6h p.r.n. for withdrawal  symptoms, monitor her closely, get a plan together, ask social work to see  her and talk with her family to gauge the level of support and we will go  from there.   ESTIMATED LENGTH OF STAY:  5-6 days.      Margaret A. Scott, N.P.      Geoffery Lyons, M.D.  Electronically Signed    MAS/MEDQ  D:  05/18/2006  T:  05/18/2006  Job:  [id number]

## 2011-03-24 NOTE — Op Note (Signed)
NAMEXOE, HOE                   ACCOUNT NO.:  [id number]   MEDICAL RECORD NO.:  [id number]           PATIENT TYPE:   LOCATION:                               FACILITY:  [facility]   PHYSICIAN:  Georgiana Spinner, M.D.    DATE OF BIRTH:  Mar 10, 1971   DATE OF PROCEDURE:  DATE OF DISCHARGE:                               OPERATIVE REPORT   PROCEDURE INDICATIONS:  The patient felt that she had a food,  specifically noodles, stick in her throat earlier today and that they  would not go down.  She had had an esophageal dilation just 3 days ago.   ANESTHESIA:  Fentanyl 100 mcg, Versed 8 mg, Phenergan 12.5 mg.   PROCEDURE:  With the patient mildly sedated in the left lateral  decubitus position, the Pentax videoscopic endoscope was inserted into  the mouth and passed under direct vision through the esophagus and it  appeared normal and there was no food impacted in the esophagus.  We  entered into the stomach and there was a large amount of food residual  sitting in the stomach and refluxing back from the duodenum, indicating  a significant degree of gastroparesis.  She had eaten at noon; this was  9 p.m. on Sunday night.  I elected not to go into the duodenal bulb,  since there was food seen there and she had just had an endoscopy 3 days  previously.  I did place the scope in retroflexed view to see that the  lower esophageal sphincter at the gastroesophageal junction was widely  patent.  The endoscope was then straightened and withdrawn.  The  patient's vital signs and pulse oximetry remained stable.  The patient  tolerated procedure well and without apparent complications.   FINDINGS:  Significant gastroparesis, otherwise an unremarkable  examination.   PLAN:  The patient tells me retrospectively that she had been on Reglan  in the past, did not have a problem with the medication, but she is no  longer taking it; I reinstituted it at this point, 10 mg a.c. and h.s.,  and will have her follow  up with Dr. Arlyce Dice as an outpatient.           ______________________________  Georgiana Spinner, M.D.     GMO/MEDQ  D:  01/27/2007  T:  01/28/2007  Job:  [id number]   cc:   Barbette Hair. Arlyce Dice, MD,FACG  [facility] [address] [place], [place] Antionette Char MD

## 2011-03-24 NOTE — Letter (Signed)
February 05, 2007    Ms. Chrisandra Wiemers   RE:  VELINDA, WROBEL  MRN:  [id number]  /  DOB:  05-30-1971   Dear Ms. Hoskinson:   I want to inform you I am withdrawing from your medical care because of  your constant failure to keep your appointments and your overuse of our  medical system.  We have done extensive evaluations of your problems and  can come up with no concrete abnormalities.  We suggest that you return  to your primary care physician for follow-up as per our previous visits.  If you need the names of any generalists or gastroenterologists in  [place], please feel free to contact us at your convenience.    Sincerely,      Vania Rea. Jarold Motto, MD, Caleen Essex, FAGA  Electronically Signed    DRP/MedQ  DD: 02/05/2007  DT: 02/05/2007  Job #: [id number]

## 2011-03-24 NOTE — Op Note (Signed)
[name], [name]                   ACCOUNT NO.:  [id number]   MEDICAL RECORD NO.:  [id number]           PATIENT TYPE:   LOCATION:                               FACILITY:  [facility]   PHYSICIAN:  Georgiana Spinner, M.D.    DATE OF BIRTH:  1971/08/16   DATE OF PROCEDURE:  DATE OF DISCHARGE:                               OPERATIVE REPORT   PROCEDURE:  Upper endoscopy.   INDICATIONS:  Dysphagia, feeling of food sticking in the throat.   ANESTHESIA:  Fentanyl 100 mcg, Versed 8 mg, Phenergan 12.5 mg.   PROCEDURE:  With the patient mildly sedated in the left lateral  decubitus position, the Pentax videoscopic endoscope was inserted in the  mouth and passed under direct vision through the esophagus, which  appeared normal, into the stomach where residual food was seen and  photographed.  We advanced past this through the fundus, body, antrum,  into the duodenal bulb which showed food there, as well. From this  point, the endoscope was withdrawn taking circumferential views of the  duodenal mucosa until the endoscope had been pulled back into the  stomach and placed in retroflexion to view the stomach from below. The  endoscope was straightened and withdrawn taking circumferential views of  the remaining gastric and esophageal mucosa.  The patient's vital signs  and pulse oximeter remained stable.  The patient tolerated the procedure  well without apparent complication.   FINDINGS:  Food remaining in the stomach.  The patient started on  metoclopramide.  Outpatient follow up recommended with Dr. Melvia Heaps.           ______________________________  Georgiana Spinner, M.D.     GMO/MEDQ  D:  09/24/2007  T:  09/24/2007  Job:  [id number]   cc:   Barbette Hair. Arlyce Dice, MD,FACG

## 2011-03-24 NOTE — H&P (Signed)
[name], [name]                   ACCOUNT NO.:  [id number]   MEDICAL RECORD NO.:  [id number]          PATIENT TYPE:  IPS   LOCATION:  0504                          [facility]   PHYSICIAN:  Jeanice Lim, M.D. DATE OF BIRTH:  Sep 08, 1971   DATE OF ADMISSION:  11/03/2005  DATE OF DISCHARGE:                         PSYCHIATRIC ADMISSION ASSESSMENT   IDENTIFYING INFORMATION:  This is a 40 year old divorced white female who  was voluntarily admitted to the [facility] on November 03, 2005.   HISTORY OF PRESENT ILLNESS:  This patient was discharged from the [facility] 11 days ago after alcohol detox.  She reports that she started  drinking immediately after discharge and her drinking escalated to a fifth a  day for approximately one week prior to this admission.  She reports that  she is blacking out every time she drinks.  She had a fight with her  grandmother last night which she does not remember.  Her grandmother who she  lives with started to take out eviction paperwork on the patient and the  patient reported to [facility] seeking detoxification.  She  reports that her sleep is okay, taking 200 mg of Seroquel at night.  She  reports that her appetite has increased and she has gained approximately 15  pounds since September.  She reports that her mood is depressed.  She feels  shameful for her behavior.  She reports racing thoughts.  She has a history  of spending money wildly if she has a credit card.  She denies any hyper-  sexuality.  She admits to isolating and anhedonia.  She denies any suicidal  or homicidal ideation or any auditory or visual hallucinations.  The patient  has a long history of alcohol abuse and dependence.  Her longest period of  sobriety was between the years of 1999 and 2001 where she stayed sober for  approximately a year and a half by attending Alcoholics Anonymous.   PAST PSYCHIATRIC HISTORY:  She was  inpatient at [facility]  earlier this month, December of 2006, as well as in September of 2006, both  times for alcohol dependence.  She was hospitalized in [facility]  in September of 2005.  She went to [facility] in 2001 but only stayed  for 3 days.  She stayed for 5 days at the [facility] [place] in 2001.  She  was at [facility] for 14 days in 2004 and she was also at [facility] treatment  facility for 2004, where she stayed for 28 days.  She is an outpatient of  the [facility].  She also sees Clement Sayres as a  Veterinary surgeon.   SOCIAL HISTORY:  She currently lives with the grandmother and her 13-year-  old daughter.  She has joint custody of a 33 year old daughter.  She is  currently unemployed, seeking work.  She has an associate's degree in  criminal justice.  She was arrested for driving while intoxicated in October  of 2006 and her court date is  pending.  She reports that her social support  system is her sponsor in [place] and other people in Alcoholics Anonymous.   FAMILY HISTORY:  She reports her grandmother was an alcoholic as well as an  uncle and an aunt who were both alcoholics.  That same uncle has depression  and her mother has depression.   ALCOHOL AND DRUG HISTORY:  She began drinking at age 56.  Most recently she  has been drinking a fifth a day for the last week, and she has used cocaine  3 times since her discharge 11 days ago.   PAST MEDICAL HISTORY:  Her primary care Yuya Vanwingerden is Dr. Antionette Char at  [facility].  Her medical problems include migraine headaches,  she reports she has approximately once a month.  She also has a history of  seizures while withdrawing from alcohol.   MEDICATIONS:  Her medications include Seroquel 200 mg at bedtime, Prozac 40  mg daily, Thorazine 50 mg 3 times a day, and lithium 400 mg 3 times a day.   DRUG ALLERGIES:  TEGRETOL, TOPAMAX both of which cause hives.   POSITIVE PHYSICAL  FINDINGS:  Her physical examination was performed here at  the [facility]  Review of systems:  She reports that she has a migraine headache monthly.  It seems to be associated with withdrawal from alcohol.  Her vital signs:  Temperature 97.5, pulse 97, respirations 20, blood pressure 110/65.   PHYSICAL EXAMINATION:  She is a well-nourished, well-developed appearing  female who looks appropriate for her stated age of 41 years.  She is 66  inches tall, 138 pounds.  NECK:  Supple and nontender with full range of motion.  CHEST:  Clear to auscultation bilaterally.  BREAST EXAM:  Deferred.  HEART:  Regular rate and rhythm without rub, gallop or murmur.  No carotid  bruits were auscultated.  ABDOMEN:  She was generally soft and nontender except for a sharp pain in  her left upper quadrant with palpation.  She had normal bowel sounds.  GU EXAM:  Deferred.  EXTREMITIES:  Full range of motion with 5/5 strength.  SKIN:  Warm and dry without lesion.  NEUROLOGIC EXAM:  Grossly intact.   LABORATORY DATA:  Her CBC had an elevated platelet count of 475.  Her CMET  had an elevated glucose of 123.  Her TSH is elevated at 7.998.  Her lithium  level was low at 0.34.  Her alcohol level was 155.  She has a urine drug  screen and a urinalysis pending.   MENTAL STATUS EXAM:  She was alert and oriented x4 with good eye contact, a  blunt affect.  Her appearance was casual.  Her behavior was calm and  cooperative.  Her speech was quiet and even tone and volume.  Her mood was  depressed.  Her thought processes coherent, without suicidal or homicidal  ideation, no evidence of auditory or visual hallucinations or mania was  observed.  Her concentration was normal, her memory is intact and her  insight is good.   ADMISSION DIAGNOSIS:  AXIS I:  Bipolar disorder not otherwise specified and  alcohol dependence.  AXIS II:  Deferred. AXIS III:  Migraine headaches.  AXIS IV:  Moderate to severe for  problems with her primary support group,  occupational problems, housing problems, economic problems and legal  problems.  AXIS V:  Current global assessment of functioning of 34, past year of 60.   INITIAL PLAN OF CARE:  Initial plan is to admit the patient voluntarily.  She is placed on a Librium detox protocol.  We will work to increase her  coping skills, decrease her stress.  We will work to stabilize her mood and  adjust her medications as necessary.  She can follow up with [facility] upon discharge.   ESTIMATED LENGTH OF STAY:  3-5 days.      Yolande Jolly, PA      Jeanice Lim, M.D.  Electronically Signed    AHW/MEDQ  D:  11/04/2005  T:  11/04/2005  Job:  [id number]

## 2011-03-24 NOTE — Consult Note (Signed)
NAMECHRYSTAL, [name]                   ACCOUNT NO.:  [id number]   MEDICAL RECORD NO.:  [id number]          PATIENT TYPE:  EMS   LOCATION:  ED                           FACILITY:  [facility]   PHYSICIAN:  Angelia Mould. Derrell Lolling, M.D.DATE OF BIRTH:  [birth year]   DATE OF CONSULTATION:  04/07/2006  DATE OF DISCHARGE:                                   CONSULTATION   EMERGENCY DEPARTMENT CONSULTATION:   DATE OF CONSULTATION:  April 07, 2006   CHIEF COMPLAINT:  Right lower quadrant abdominal pain.   HISTORY OF PRESENT ILLNESS:  This is a 40 year old white female who  underwent a laparoscopic appendectomy on February 21, 2006 by Dr. Harriette Bouillon.  Operative findings were of a normal appendix and some adhesions  and the pathology report showed a normal appendix.  She states that she has  had right lower quadrant pain since that time but for the past month perhaps  a little bit worse.  Denies nausea, vomiting, change in bowel habits or  fever.  She called on the phone today requesting pain medication.  I  declined to do that and she came to the emergency room.  Significant history  is related to me by the emergency department nursing staff and by Dr.  Mancel Bale.  She has had 32 ER visits within the past year or two for a  variety of pain complaints.  She is flagged on the emergency department  computer for drug abuse and alcohol abuse.  She has also been hospitalized  at [facility] in January 2007 for alcohol abuse.  I am evaluating her  to rule out acute surgical or medical conditions.   PAST HISTORY:  Migraines.  Kidney stones.  Cesarean section.  Hysterectomy.  Hypothyroidism.  Hospitalized January 2007 for alcohol abuse at [facility].  Status post appendectomy April 2007.   CURRENT MEDICATIONS:  Synthroid 25 mcg a day.  Tylenol.  Advil.   DRUG ALLERGIES:  IMITREX.  TEGRETOL.  TOPAMAX.   SOCIAL HISTORY:  Positive tobacco abuse.  Positive alcohol abuse documented.  Suspected drug  abuse per ER staff.   FAMILY HISTORY:  Noncontributory.   REVIEW OF SYSTEMS:  All systems reviewed and noncontributory except as  above.   PHYSICAL EXAMINATION:  GENERAL:  Middle-aged white female appears  comfortable lying on the stretcher very flattened affect in no obvious  distress.  VITAL SIGNS:  Temperature 97.5, pulse 103, respirations 20, blood pressure  108/72.  LUNGS:  Clear to auscultation.  Chest wall nontender.  HEART:  Regular rate and rhythm.  No murmur.  ABDOMEN:  Well-healed laparoscopy scars from her appendectomy.  No wound  infection.  No hernias.  The abdomen is soft and nontender.  The abdomen is  not distended.  The liver and spleen are not enlarged.  There is no mass.  Particular attention is paid to the right lower quadrant suprapubic area and  left lower quadrant and exam is completely benign without any signs of any  peritoneal irritation.   DATA:  Hemoglobin 13.7, white blood cell count 10,300.  Urinalysis  completely clear, no blood or white cells.   ASSESSMENT:  1.  Abdominal pain of uncertain etiology.  This may be due to adhesions.      This may be functional in nature.  2.  History of alcohol abuse.  3.  History of tobacco abuse.   PLAN:  1.  The patient is discharged home without any further investigations.  2.  She was given two Percocet to take which she did.  3.  She requested prescription pain medication which I declined to give her.  4.  Follow up with [place] p.r.n.      Angelia Mould. Derrell Lolling, M.D.  Electronically Signed     HMI/MEDQ  D:  04/07/2006  T:  04/07/2006  Job:  [id number]

## 2011-03-24 NOTE — Telephone Encounter (Signed)
See pa or Doc this pm or er

## 2011-03-24 NOTE — Discharge Summary (Signed)
[name], [name]                   ACCOUNT NO.:  [id number]   MEDICAL RECORD NO.:  [id number]          PATIENT TYPE:  IPS   LOCATION:  0306                          FACILITY:  [facility]   PHYSICIAN:  Jeanice Lim, M.D. DATE OF BIRTH:  [birth year]   DATE OF ADMISSION:  07/15/2005  DATE OF DISCHARGE:  07/19/2005                                 DISCHARGE SUMMARY   IDENTIFYING DATA:  This is a female brought to the emergency department by  law enforcement because left [facility] lobby AMA  on July 14, 2005 about 9 p.m. stating that she was suicidal.  Had been  sober for 10 months from cocaine and alcohol until the past month.  Apparently was brought to the emergency room by the police and alcohol level  was less than 5.  Drug screen was positive for cocaine and benzodiazepines.  The patient was not very truthful regarding the amount of cocaine using that  she was using.  Had a history of withdrawal seizures from alcohol and there  was a long discussion regarding the patient's need for IV Ativan.  The  patient described her skin was crawling and burning.  She was tremulous.   PAST PSYCHIATRIC HISTORY:  Reports in 2001 had been inpatient at [facility] in [place], at [place] and [facility] and [facility] in the past and  [place] [place] in September of 2004 and November of 2005.   ALCOHOL/DRUG HISTORY:  Long history of alcohol and cocaine use, using up to  $200 a day.   MEDICATIONS:  Had been prescribed Wellbutrin, Prozac, Vistaril and Seroquel  and reports not being on medications for months.   ALLERGIES:  TEGRETOL (causing rash).   PHYSICAL EXAMINATION:  Physical and neurologic exam within normal limits.   LABORATORY DATA:  Routine admission labs essentially within normal limits.   MENTAL STATUS EXAM:  Alert, oriented, casually groomed and dressed.  Moderately nourished.  Irritable.  Distressed.  Thought processes goal  directed, wants to  get through withdrawal.  Judgment and insight were fair.  No psychotic symptoms or acute dangerous ideation.  Reported that she had  been hearing voices prior to admission.   ADMISSION DIAGNOSES:  AXIS I:  Cocaine-induced psychosis.  Cocaine  dependence.  Polysubstance abuse.  Alcohol dependence.  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  AXIS II:  Deferred.  AXIS III:  History of migraines and history of alcohol withdrawal seizures  by report.  AXIS IV:  Issues with occupation, housing, economic and problems with legal  system, had a felony charge for forging prescription, does not have a court  date.  AXIS V:  30/55.   HOSPITAL COURSE:  The patient was admitted and ordered routine p.r.n.  medications and underwent further monitoring.  Was encouraged to participate  in individual, group and milieu therapy.  He was tolerating and placed on  detox protocol.  Monitored for safety and treated to restore sleep and to  target mood and to stabilize history of mood instability and suspiciousness.  The patient was monitored medically.  CONDITION ON DISCHARGE:  Discharged in improved condition, showing a robust  response to treatment.  Mood was euthymic.  Affect brighter.  No acute  withdrawal symptoms.  No psychotic symptoms.  No dangerous ideation.  She  was tolerated medications without side effects.  She was given medication  education.   DISCHARGE MEDICATIONS:  1.  Symmetrel 100 mg b.i.d. for three days and then stop.  2.  Prozac 10 mg q.a.m.  3.  Seroquel 100 mg at 8 p.m.  4.  Risperdal 0.5 mg at 8 p.m.  5.  Eskalith CR 450 mg at 8 p.m.   FOLLOW UP:  The patient was to follow up with [facility]  for [facility] and counseling and [facility] on [address] on July 20, 2005 between 1-4 p.m. walk-in.  Appointment with  [facility] was on July 19, 2005 at 5 p.m.   DISCHARGE DIAGNOSES:  AXIS I:  Cocaine-induced psychosis.  Cocaine   dependence.  Polysubstance abuse.  Alcohol dependence.  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  AXIS II:  Deferred.  AXIS III:  History of migraines and history of alcohol withdrawal seizures  by report.  AXIS IV:  Issues with occupation, housing, economic and problems with legal  system, had a felony charge for forging prescription, does not have a court  date.  AXIS V:  GAF on discharge 55.      Jeanice Lim, M.D.  Electronically Signed     JEM/MEDQ  D:  08/30/2005  T:  08/31/2005  Job:  [id number]

## 2011-03-24 NOTE — Telephone Encounter (Signed)
Pt had an ercp on 03/14/11. She was admitted due to possible aspiration during the procedure and post ercp pancreatitis. Pt states that she is having pain under her ribs like she did in the hospital following the ercp. She rates the pain at a 6. Pt states she doesn't know if she needs lab work done or more pain medication. Pt does have a history of substance abuse. Dr. Jarold Motto as doc of the day please advise.

## 2011-03-24 NOTE — Assessment & Plan Note (Signed)
[facility]                                 ON-CALL NOTE   NAME:Guyette, Garyn                            MRN:          [id number]  DATE:01/29/2007                            DOB:          12-31-1970    She states that her primary gastroenterologist is Dr. Sheryn Bison.  She recently had procedures by Dr. Melvia Heaps and Dr. Sabino Gasser.   Cerise called today. She has the feeling of a pill stuck in the middle of  her esophagus. She had a similar feeling two days ago and had endoscopy  by Dr. Sabino Gasser. He found a widely patent GE junction, but a lot of  food in her stomach. He presumed that she had gastroparesis and put her  on Reglan 10 mg four times a day. She was to followup with Dr. Arlyce Dice as  an outpatient. She had also had recent EGD with Dr. Arlyce Dice last week.  She is not clear on exactly what he found. She believes he may have  dilated something and is not clear if he actually found a food impaction  at that time.   She now has a feeling of a pill stuck in the middle of her esophagus.  She has had liquids go down fine. I have been reading through some of  her old notes and have noted a history of polysubstance abuse, suicidal  ideations and quite a significant psychiatric history. I have already  recommended that she proceed to the emergency room. My initial plan is  to perform EGD today for her.     [name], MD  Electronically Signed    DPJ/MedQ  DD: 01/29/2007  DT: 01/29/2007  Job #: [id number]   cc:   Barbette Hair. Arlyce Dice, MD,FACG  Vania Rea. Jarold Motto, MD, Caleen Essex, FAGA

## 2011-03-24 NOTE — Op Note (Signed)
[name], [name]                   ACCOUNT NO.:  [id number]   MEDICAL RECORD NO.:  [id number]          PATIENT TYPE:  OIB   LOCATION:  1012                         FAC[facility]  [facility]   PHYSICIAN:  Thomas A. Cornett, M.D.DATE OF BIRTH:  June 09, 1971   DATE OF PROCEDURE:  02/21/2006  DATE OF DISCHARGE:                                 OPERATIVE REPORT   PREOPERATIVE DIAGNOSIS:  Severe right lower quadrant pain.   POSTOPERATIVE DIAGNOSIS:  Extensive right lower quadrant adhesions involving  the terminal ileum and cecum as well as appendix.   PROCEDURE:  1.  Laparoscopic adhesiolysis.  2.  Incidental laparoscopic appendectomy.   SURGEON:  Dr. Harriette Bouillon.   ANESTHESIA:  General endotracheal anesthesia with 10 mL of 0.25% Sensorcaine  local.   ESTIMATED BLOOD LOSS:  50 mL.   SPECIMEN:  Appendix to pathology.   INDICATIONS FOR PROCEDURE:  The patient is a 40 year old female whose had  progressive severe right lower quadrant pain over the last 2-3 weeks.  She  has been extensively worked up with two CT scans but no evidence of her pain  could be found.  She has had a normal white count but her pain is constant  and very severe requiring narcotics to control.  At this point in time, she  was referred to me for evaluation and  I felt a diagnostic laparoscopy would  be of some help.  She has a history of having a previous hysterectomy as  well as a right oophorectomy. My concern was for possible adhesions as being  the cause of this.  Her pain was also associated with severe nausea and  vomiting and anorexia.  I discussed the procedure as well as the  possibilities of converting to an open procedure as well as the  complications which include bleeding, infection, bowel injury requiring  resection, enterostomy, ureteral injury, possible appendectomy, solid organ  injury as well as complications of CO2 insufflation and anesthetic  complications.  She voiced her understanding of the above  and agreed to  proceed.   DESCRIPTION OF PROCEDURE:  The patient was brought to the operating room and  placed supine.  Both arms were tucked.  After induction of general  endotracheal anesthesia, an oral gastric tube was placed as well as a Foley  catheter.  A 1 cm infraumbilical incision was made through a previous scar  from a laparoscopy.  I dissected down to the fascia and made an incision in  the fascia.  I grabbed both sides with Kocher's and then used a hemostat to  open the peritoneal lining.  I placed my finger in the abdominal space,  swept around and felt no adhesions.  Pursestring suture of #0 Vicryl was  placed, an 11-mm Hassan cannula was placed under direct vision.  Pneumoperitoneum was then created to 15 mmHg with CO2 and a laparoscope was  placed.  Laparoscopy was then performed.  No evidence of any pelvic  adhesions.  The gallbladder, liver, and stomach appeared normal.  The colon  appeared normal as well.  I then placed two  5 mm ports in the lower midline  in between the pubic symphysis and the umbilicus and the patient's left  lower quadrant.  Graspers were used and I then found the cecum and examined  it.  The appendix showed signs of adhesions and was twisted around itself  without signs of inflammation.  Also of note, the terminal ileum was densely  adherent to the right pelvic sidewall from what appeared to previous right  oophorectomy.  The uterus was surgically absent.  The left ovary was  visualized and pictures were taken.  This appeared relatively unremarkable.  There was no free fluid in the abdominal cavity.  She did have very severe  adhesions involving her terminal ileum and this was looped upon itself in a  configuration that almost looked like a partial closed loop obstruction.  I  had began to wonder if this was the cause of her pain given the appearance  of her terminal ileum due to the dense adhesions and significant twisting of  the ileum.  There  was no signs of a acute insufficiency to the bowel but  this may be causing a partial obstruction contributing to the patient's  pain.  Also of note, the appendix was adhesed and adhesive bands were  wrapped around the appendix twisting it upon itself.  This as well may have  been responsible for her pain.  In any event, I felt that appendectomy  needed to be performed in this setting given the adhesions around the  appendix and I felt that adhesiolysis would be of benefit to her looking at  her small bowel and the way it was kinked. The appendix was addressed first.  I placed a 10 mm port in the right mid abdomen.  Through that I placed a  scope and then used a grasper to grab the tip of the appendix.  I took the  adhesions down around the appendix using the harmonic scalpel.  Once I did  this, I could better visualize the mesoappendix.  I then used a harmonic  scalpel to take the mesoappendix down with some with good hemostasis.  Once  I was able to take all of this down, I had to mobilize the cecum somewhat to  release more adhesions to better visualize the base.  Once I did this, I  could see the base very well.  I then placed a GIA 30 stapling device and  fired this across the base of the appendix and removed the appendix using an  EndoCatch bag.  This was all passed off the field.  There was some oozing at  the appendiceal base.  Pressure seemed to stop this.  Now I examined the  small bowel and ileum and this was densely adherent to the right pelvic  sidewall.  Again, there was a hair pin turn about 10 cm from the ileocecal  valve where the bowel was densely adherent to the right pelvic sidewall.  Around this was some more adhesions and this had the appearance of a closed  loop obstruction without complete obstruction.  I mobilized the ileum away  from the pelvic sidewall very carefully using scissors to avoid using any significant amount of heat source.  This was very tedious and  took well over  an hour just to lyse these adhesions along the pelvic sidewall.  Great care  was taken to identify the right iliac vessel which we did and the ureter  with we identified as well and kept this  out of our dissection field.  We  were able to dissect very carefully all the way down into the pelvis for  about 6-7 cm to free up this loop of bowel. This took again well over an  hour to do that alone.  Once I was able to do this, the bowel was examined  very carefully.  There was some bruising of the bowel but I did not see any  injury to the muscularis or mucosa. I did not see any partial thickness  tears to the bowel either.  Again this involved the last 10-15 cm of the  ileum.  The remainder of her small bowel that I visualized looked normal.  The colon was examined carefully and the pelvis and I did not see any  evidence of injury to that.  The area of adhesiolysis was carefully examined  and found to be hemostatic.  Irrigation was used and this was suctioned at  this point in time.  The remainder of her small bowel appeared normal  without any obvious abnormality.  The remainder of her cecum, ascending,  transverse, and descending colon appeared normal on gross examination.  The  stomach and gallbladder appeared normal as well as the liver.  At this point  in time after irrigating, Tisseel was used since there was still a little  bit of oozing around the staple line at the appendix and this stopped it  quite nicely.  Again the mesoappendix was hemostatic.  Irrigation was used  prior to this and suctioned out until clear.  The abdominal cavities was  deinsufflated and reinsufflated to check for bowel injuries and I saw none  either involving the small or large bowel.  The stump appeared hemostatic  and well sealed.  At this point I time, I felt that this was the cause of  her pain and was satisfied with this.  Ports were subsequently then removed  under direct vision with no  signs of port site bleeding.  The camera was  then withdrawn and the umbilical port was passed off the field.  I closed  the umbilicus with a #0 Vicryl pursestring suture.  4-0  Monocryl was used to close all skin incisions.  All sponge, needle, and  instrument counts were then counted and found to be correct at this portion  of the case.  The appendix was sent to pathology for evaluation.  The  patient was awoke and taken to recovery in satisfactory condition.      Thomas A. Cornett, M.D.  Electronically Signed     TAC/MEDQ  D:  02/21/2006  T:  02/22/2006  Job:  [id number]   cc:   Vania Rea. Jarold Motto, M.D. [facility]  [address]  [place]  [place] [address]

## 2011-03-24 NOTE — Discharge Summary (Signed)
[name], [name]                   ACCOUNT NO.:  [id number]   MEDICAL RECORD NO.:  [id number]          PATIENT TYPE:  IPS   LOCATION:  0305                          [facility]ACILITY:  [facility]   PHYSICIAN:  Jasmine Pang, M.D. DATE OF BIRTH:  1971-02-13   DATE OF ADMISSION:  05/28/2006  DATE OF DISCHARGE:  06/02/2006                                 DISCHARGE SUMMARY   IDENTIFYING INFORMATION:  The patient is a 40 year old divorced Caucasian  female who was admitted on a voluntary basis on May 28, 2006, to my  service.  The patient has a history of chemical dependence and bipolar  disorder. She was released from [facility] on the day of this  admission. She became very distraught because she could not find a long-term  treatment center.  She states that she became suicidal with a plan to  overdose.  Before [facility] she had been using alcohol non-stop  two fifths daily and cocaine ($500 in three days), Xanax 10-15 at a time.   PAST PSYCHIATRIC HISTORY:  The patient has a history of chemical dependence  (alcohol and drugs).  She was sober for seven months prior to three weeks  ago when she relapsed. She had overdoses one week ago.  As indicated above  she was in [facility] immediately prior to this admission.   FAMILY HISTORY:  Maternal cousin had depression and completed suicide.  Mother's side depressed all over the place.  Mother's uncle tried to kill  himself.   SUBSTANCE ABUSE HISTORY:  The patient smokes.  Positive substance  dependence, cocaine and alcohol.  Also has a history of use of heroin and  other drugs.   PAST MEDICAL HISTORY:  Medical problems--hypothyroidism, but not compliant  with her treatment. She has also had a history of treatment, appendectomy in  April 2007.   MEDICATIONS:  1.  Lithium 300 mg p.o. t.i.d.  2.  Prozac 20 mg daily.  3.  Risperdal ? dose.  4.  Seroquel ? Dose.   DRUG ALLERGIES:  TEGRETOL, IMITREX, TOPAMAX (rash,  itching).   POSITIVE PHYSICAL FINDINGS:  Physical exam was done in the [facility] ED.  She received Toradol 30 mg IVP for some headache pain.   Her admission laboratories reveal urine drug screen positive for  barbiturates.  Otherwise, negative.  Urine pregnancy test negative.  Urinalysis within normal limits.  Alcohol level less than 5 (0-10), CBC  reveals WBC slightly elevated at 10.9 (4 to 10.5), eosinophil slightly  increased at 6%. Basic metabolic panel was grossly within normal limits  except for an elevated glucose.   HOSPITAL COURSE:  Upon admission the patient was started on Ambien 10 mg  p.o. q.h.s. p.r.n. and may repeat if necessary.  She was also continued on  lithium carbonate 300  mg p.o. t.i.d.  She is placed on Neurontin 10 mg p.o.  t.i.d. due to anxiety.  Prozac was increased from 20 mg to 40 mg daily.  On  May 29, 2006, she was placed on Thorazine 50 mg p.o. q.6h. p.r.n.  anxiety.  The patient tolerated her medications well.  No significant side-effects.  A  lithium level, CBC, and hepatic profile were ordered for the morning of  discharge, but results are pending.   Upon meeting the patient she discussed family issues, she stated that she  was very alienated from her family due to her using.  She is afraid that the  family will not let her see her two children.  She states her mother will  not answer the phone if she knows it is her.  She feels worthless about  what I have done to my children.  On May 31, 2006,  the patient found out  that she could get into the residential treatment program, [facility], at [place].  She worried about the fact that it was a state hospital, but advised that  the program was very good.  Mood was more depressed.  She wanted to go home  before [place] transfer.  I advised her not, that this was not a good idea, but  she insisted and was no longer suicidal.   On June 01, 2006, the patient was doing okay.  However, she asked to stay  another day,  she was worried because she has a check at home and he may cash  it if she leaves today since the banks are open.  I have tried to encourage  to stay until Monday so she can transfer directly to the long-term  treatment.  She still wants to go home and be able to see  her children.   On June 02, 2006, mental status had improved, the patient had good eye  contact.  Her psychomotor activity was still somewhat retarded but improved  from admission.  Speech normal rate and slow.  The patient's mood was less  depressed and anxious, so there was still some dysphoria.  Affect--some  constriction, but wider range upon admission.  No suicidal or homicidal  ideation.  No auditory or visual hallucinations. No paranoia or delusions.  Thoughts were logical and goal directed.  Thought content--anxious to see  her children.  Cognitive exam--within normal limits grossly.  The patient  planned to go to the [facility] program at [facility] on Monday and felt that she would  be able to resist the temptation to use before she went.   DISCHARGE DIAGNOSES:  Axis I:  Bipolar disorder, depressed, severe.  Polysubstance dependence.  Axis II:  Features of borderline personality disorder.  Axis II:  Hypothyroidism, history of seizures.  Axis IV:  Severe, [facility] support group. Family is having to take  care of her children.   Housing problem, economic problem.  Other  psychosocial problems.  Chronic substance abuse.  Axis V:  GAF upon discharge was 43; GAF upon admission was 35;  GAF highest  past year was 55.   DISCHARGE PLAN:  There are no specific activity level or dietary  restrictions.   DISCHARGE MEDICATIONS:  1.  Lithium carbonate 300 mg one pill in the morning and two pills at night.  2.  Neurontin 100 mg t.i.d.  3.  Fluoxetine HCl 40 mg daily.  4.  Ambien 10 mg at bedtime if needed for sleep.  5.  Thorazine 50 mg q.6h. p.r.n. agitation/anxiety.  POST HOSPITAL CARE PLANS:  [facility] program at [facility].  She will  meet with [facility] on Monday, June 04, 2006,  at 10 a.m.      Jasmine Pang, M.D.  Electronically Signed  BHS/MEDQ  D:  06/02/2006  T:  06/02/2006  Job:  [id number]
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
## 2011-03-24 NOTE — Telephone Encounter (Signed)
Pt instructed to go to the ER to be evaluated. Pt verbalized understanding.
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
## 2011-03-24 NOTE — Discharge Summary (Signed)
[name], [name]                   ACCOUNT NO.:  [id number]   MEDICAL RECORD NO.:  [id number]          PATIENT TYPE:  IPS   LOCATION:  0304                          F[facility]ILITY[facility]  [facility]   PHYSICIAN:  Geoffery Lyons, M.D.      DATE OF BIRTH:  [birth year]   DATE OF ADMISSION:  10/20/2005  DATE OF DISCHARGE:  10/24/2005                                 DISCHARGE SUMMARY   CHIEF COMPLAINT AND PRESENT ILLNESS:  This was the second admission to [facility] for this 40 year old single white female.  Presented  to the emergency department requesting detox from alcohol.  Initial alcohol  level was 90.  Became more depressed a few weeks prior to this admission.  Started drinking.  Alcohol abuse history of 15 years.  Prior to this  admission, she was admitted from September 9th to the 13th with alcohol as  well as cocaine.   PAST PSYCHIATRIC HISTORY:  Had been inpatient at the [facility] in  [place], [place] in 2001, [facility] and [facility] as well as [facility]  __________ [place].   ALCOHOL/DRUG HISTORY:  Persistent use of alcohol.  Claims abstinence from  cocaine.   MEDICAL HISTORY:  Migraine headaches.   MEDICATIONS:  Prozac 60 mg per day, Seroquel 100 mg at night, Risperdal 0.5  mg at night and Eskalith 450 mg at night.   PHYSICAL EXAMINATION:  Performed and failed to show any acute findings.   LABORATORY DATA:  Drug screen negative for substances of abuse.  Blood  chemistry with glucose 88.   MENTAL STATUS EXAM:  Alert, cooperative female.  Some psychomotor retardation.  Speech was slow.  Mood claims has ups and downs.  Affect shows a normal  range.  Thought processes are somewhat clear, linear.  She wants to get  better.  Judgment and insight were poor.  Cognition was well-preserved.   ADMISSION DIAGNOSES:  AXIS I:  Alcohol dependence.  Polysubstance abuse.  Mood disorder not otherwise specified.  AXIS II:  No diagnosis.  AXIS III:  No diagnosis.  AXIS IV:   Moderate.  AXIS V:  GAF upon admission 25-30; highest GAF in the last year 60.   HOSPITAL COURSE:  She was admitted.  She was started in individual and group  psychotherapy.  She was initially maintained on her medications.  She was  given Ambien for sleep.  She was detoxified with Librium.  Lithium was  placed as 300 mg twice a day and she was given Seroquel 100 mg at night.  Lithium was changed to Eskalith CR 450 mg twice a day.  She endorsed she had  relapsed on alcohol.  Endorsed she was feeling very depressed.  Alcohol was  the way of dealing with the way she was feeling.  Overwhelmed.  Also  endorsed headaches.  There was a sense of being overwhelmed, wanting to give  up, mood swings, feeling down, depressed, hopeless, helpless.  Continued to  endorse feeling overwhelmed, anxious, agitated, hopeless, helpless.  Was  trying to get benzodiazepines.  She was told that,  due to alcohol dependency  and prior history of polysubstance abuse, we were going to stay on  medication that were not addictive.  Endorsed she has been under a lot of  stress.  Lost family support due to her more recent relapse.  Endorsed  anxiety.  Was wanting to try Thorazine as her outpatient doctor was going to  try it.  We gave Thorazine a try.  We continued to detox.  We worked relapse  prevention plan.  By December 18th, she was turning around.  He was able to  open up.  There was a family session with the grandmother who was the only  support she had left.  She was going to have an interview to go to  __________ by March.  On December 19th, she was in full contact with  reality.  No suicidal or homicidal ideation.  Detoxed from alcohol.  Willing  and motivated to pursue further outpatient treatment.   DISCHARGE DIAGNOSES:  AXIS I:  Alcohol dependence.  Polysubstance abuse.  Mood disorder not otherwise specified.  AXIS II:  No diagnosis.  AXIS III:  Migraines, history of withdrawal seizures.  AXIS IV:   Moderate.  AXIS V:  GAF upon discharge 55.   DISCHARGE MEDICATIONS:  1.  Thorazine 25 mg, 1-2 every six hours as needed for anxiety.  2.  Seroquel 200 mg at night.  3.  Eskalith 450 mg twice a day.  4.  Prozac 20 mg, 2 in the morning.   FOLLOW UP:  Dr. Lang Snow at [facility].      Geoffery Lyons, M.D.  Electronically Signed     IL/MEDQ  D:  11/02/2005  T:  11/03/2005  Job:  [id number]

## 2011-03-24 NOTE — Discharge Summary (Signed)
[name], [name]                   ACCOUNT NO.:  [id number]   MEDICAL RECORD NO.:  [id number]          PATIENT TYPE:  IPS   LOCATION:  0403                          FACILITY[facility]  [facility]   PHYSICIAN:  Geoffery Lyons, M.D.      DATE OF BIRTH:  12-16-1970   DATE OF ADMISSION:  05/11/2006  DATE OF DISCHARGE:  05/17/2006                                 DISCHARGE SUMMARY   CHIEF COMPLAINT AND PRESENT ILLNESS:  This was one of multiple admissions to  [facility] for this 40 year old divorced white female.  She relapsed on drugs two weeks prior to this admission.  She was last seen  in January.  Followed up with Dr. Mila Homer at the [facility] and she stopped  all of her discharge medication as she claimed.  She did well until two  weeks prior to this admission.  Endorsed that she could not identify the  trigger.  Endorsed that the depression was getting worse, it was unbearable.  She quit going to 12-step and quit calling her sponsor.   PAST PSYCHIATRIC HISTORY:  Last time December 29th to January 4th and she  had been admitted 11 days prior to that.  Had been at [facility] in  September of 2005, [facility], [address] [place], [place], [place].   ALCOHOL/DRUG HISTORY:  As already stated, persistent use of substances.  History of DTs and blackouts.  Using five bags of heroin a day for the last  two weeks.  Alcohol, a fifth a day for the last two weeks, intermittent  crack use.   MEDICAL HISTORY:  Migraine headaches, claimed relieved with Dilaudid and  Phenergan.   MEDICATIONS:  No currently prescribed medications.  Last time, she was on  lithium CR 300 mg in the morning and 600 mg at bedtime, Seroquel 200 mg at  night, Thorazine 50 mg up to three times a day as needed, Risperdal 1 mg at  8 p.m. and Prozac 20 mg, 2 in the morning.   PHYSICAL EXAMINATION:  Performed and failed to show any acute findings.   LABORATORY DATA:  CBC with white blood cells 10.6,  hemoglobin 15.9.  Blood  chemistry with sodium 136, potassium 3.6, glucose 95, BUN 6, creatinine 0.8.  Drug screen with opiates negative, cocaine positive.   MENTAL STATUS EXAM:  Upon admission revealed an alert, cooperative female.  Appropriately groomed and dressed.  Speech was not pressured.  Mood was  depressed and anxious.  Affect was congruent.  Thought processes were clear,  rational and goal-oriented.  Wanted to be on the phenobarbital protocol.  No  evidence of delusions.  No suicidal or homicidal ideation.  Feeling that,  although she does not see spiders, feels like something is crawling on her.  Claims shaking on the inside although there were no visible tremors.   ADMISSION DIAGNOSES:  AXIS I:  Polysubstance dependence.  Mood disorder not  otherwise specified.  AXIS II:  No diagnosis.  AXIS III:  Migraines.  AXIS IV:  Moderate.  AXIS V:  GAF upon  admission 35; highest GAF in the last year 55-60.   HOSPITAL COURSE:  She was admitted.  She was started in individual and group  psychotherapy.  She was detoxified with phenobarbital and clonidine.  She  was given Midrin.  She was given Dilaudid 4 mg IM and Phenergan 25 mg one-  time dose.  She was given some Vistaril.  Admitted that she relapsed on  multiple drugs after she went through a DUI assessment at the [facility].  She was discharged from them with no medication.  She was told that she was  not bipolar as she claimed.  Endorsed that, since she has not been on  medication, she feels really bad.  Has not been able to get herself back  together.  Endorsed anxiety, depression, being very upset, somatically  focused.  Wanted Dilaudid, Phenergan.  She was complaining of itching,  crawling skin.  Claimed she had never felt this way before.  Some  withdrawal.  Subjective sense of tremulousness.  Some perceptual changes.  She started hitting on herself, as wanting to remove spiders from herself.  This happened on a clear  sensorium.  She required a therapeutic hall.  She  was given Ativan and Haldol IM.  She was helped by this approach.  On May 15, 2006, she was sedated.  She was still medication-seeking, somatically  focused.  Wanting IM phenobarbital.  Looking into going into some sort of  residential treatment program.  She improved and, by May 17, 2006, she was  in full contact with reality.  There were no suicidal ideation, no homicidal  ideation, no hallucinations, no delusions.  She was able to get a bed in  [facility].  She was afraid she was going to lose the bed, so her  grandmother's friend was going to pick her up to take her straight there.  Endorsed commitment to abstinence and felt positive about his disposition.   DISCHARGE DIAGNOSES:  AXIS I:  Polysubstance dependence.  Mood disorder not  otherwise specified.  AXIS II:  No diagnosis.  AXIS III:  Migraines.  AXIS IV:  Moderate.  AXIS V:  GAF upon discharge 50.   DISCHARGE MEDICATIONS:  1. Seroquel 50 mg twice a day, 100 mg at night.  2. Naproxen 500 mg twice a day.   FOLLOWUP:  [facility].      Geoffery Lyons, M.D.  Electronically Signed     IL/MEDQ  D:  06/01/2006  T:  06/02/2006  Job:  [id number]

## 2011-03-25 ENCOUNTER — Emergency Department (HOSPITAL_COMMUNITY)
Admission: EM | Admit: 2011-03-25 | Discharge: 2011-03-25 | Disposition: A | Discharge status: Home / Self Care | Payer: 59 | Attending: Emergency Medicine | Admitting: Emergency Medicine

## 2011-03-25 DIAGNOSIS — R1013 Epigastric pain: Secondary | ICD-10-CM | POA: Insufficient documentation

## 2011-03-25 DIAGNOSIS — K859 Acute pancreatitis without necrosis or infection, unspecified: Secondary | ICD-10-CM | POA: Insufficient documentation

## 2011-03-25 DIAGNOSIS — Z90710 Acquired absence of both cervix and uterus: Secondary | ICD-10-CM | POA: Insufficient documentation

## 2011-03-25 DIAGNOSIS — Z9089 Acquired absence of other organs: Secondary | ICD-10-CM | POA: Insufficient documentation

## 2011-03-25 LAB — COMPREHENSIVE METABOLIC PANEL
ALT: 43 U/L — ABNORMAL HIGH (ref 0–35)
AST: 51 U/L — ABNORMAL HIGH (ref 0–37)
Alkaline Phosphatase: 102 U/L (ref 39–117)
Glucose, Bld: 117 mg/dL — ABNORMAL HIGH (ref 70–99)
Potassium: 3 mEq/L — ABNORMAL LOW (ref 3.5–5.1)
Sodium: 134 mEq/L — ABNORMAL LOW (ref 135–145)
Total Protein: 7.5 g/dL (ref 6.0–8.3)

## 2011-03-25 LAB — DIFFERENTIAL
Basophils Absolute: 0.1 10*3/uL (ref 0.0–0.1)
Eosinophils Relative: 2 % (ref 0–5)
Lymphocytes Relative: 56 % — ABNORMAL HIGH (ref 12–46)
Neutro Abs: 3.4 10*3/uL (ref 1.7–7.7)

## 2011-03-25 LAB — CBC
HCT: 44.5 % (ref 36.0–46.0)
Hemoglobin: 15.4 g/dL — ABNORMAL HIGH (ref 12.0–15.0)
RDW: 13.1 % (ref 11.5–15.5)
WBC: 9.7 10*3/uL (ref 4.0–10.5)

## 2011-03-26 ENCOUNTER — Emergency Department (HOSPITAL_COMMUNITY): Payer: 59

## 2011-03-26 ENCOUNTER — Inpatient Hospital Stay (HOSPITAL_COMMUNITY)
Admission: EM | Admit: 2011-03-26 | Discharge: 2011-04-03 | DRG: 393 | Disposition: A | Discharge status: Home / Self Care | Payer: 59 | Attending: Internal Medicine | Admitting: Internal Medicine

## 2011-03-26 DIAGNOSIS — K859 Acute pancreatitis without necrosis or infection, unspecified: Secondary | ICD-10-CM | POA: Diagnosis present

## 2011-03-26 DIAGNOSIS — K929 Disease of digestive system, unspecified: Principal | ICD-10-CM | POA: Diagnosis present

## 2011-03-26 DIAGNOSIS — F319 Bipolar disorder, unspecified: Secondary | ICD-10-CM | POA: Diagnosis present

## 2011-03-26 DIAGNOSIS — F17200 Nicotine dependence, unspecified, uncomplicated: Secondary | ICD-10-CM | POA: Diagnosis present

## 2011-03-26 DIAGNOSIS — Y849 Medical procedure, unspecified as the cause of abnormal reaction of the patient, or of later complication, without mention of misadventure at the time of the procedure: Secondary | ICD-10-CM | POA: Diagnosis present

## 2011-03-26 DIAGNOSIS — M25569 Pain in unspecified knee: Secondary | ICD-10-CM | POA: Diagnosis present

## 2011-03-26 DIAGNOSIS — K5289 Other specified noninfective gastroenteritis and colitis: Secondary | ICD-10-CM | POA: Diagnosis present

## 2011-03-26 DIAGNOSIS — G4700 Insomnia, unspecified: Secondary | ICD-10-CM | POA: Diagnosis present

## 2011-03-26 DIAGNOSIS — I10 Essential (primary) hypertension: Secondary | ICD-10-CM | POA: Diagnosis present

## 2011-03-26 LAB — DIFFERENTIAL
Basophils Relative: 1 % (ref 0–1)
Lymphs Abs: 4.1 10*3/uL — ABNORMAL HIGH (ref 0.7–4.0)
Monocytes Absolute: 0.3 10*3/uL (ref 0.1–1.0)
Monocytes Relative: 4 % (ref 3–12)
Neutro Abs: 4.1 10*3/uL (ref 1.7–7.7)
Neutrophils Relative %: 46 % (ref 43–77)

## 2011-03-26 LAB — CBC
Hemoglobin: 15.2 g/dL — ABNORMAL HIGH (ref 12.0–15.0)
MCH: 31.7 pg (ref 26.0–34.0)
MCHC: 34.4 g/dL (ref 30.0–36.0)
MCV: 92.3 fL (ref 78.0–100.0)
RBC: 4.79 MIL/uL (ref 3.87–5.11)

## 2011-03-26 LAB — COMPREHENSIVE METABOLIC PANEL
BUN: 12 mg/dL (ref 6–23)
CO2: 24 mEq/L (ref 19–32)
Calcium: 9.8 mg/dL (ref 8.4–10.5)
Chloride: 102 mEq/L (ref 96–112)
Creatinine, Ser: 0.83 mg/dL (ref 0.4–1.2)
GFR calc Af Amer: >60 mL/min (ref >60)
GFR calc non Af Amer: >60 mL/min (ref >60)
Glucose, Bld: 77 mg/dL (ref 70–99)
Total Bilirubin: 0.2 mg/dL — ABNORMAL LOW (ref 0.3–1.2)

## 2011-03-26 LAB — LIPASE, BLOOD: Lipase: 140 U/L — ABNORMAL HIGH (ref 11–59)

## 2011-03-26 LAB — AMYLASE: Amylase: 134 U/L — ABNORMAL HIGH (ref 0–105)

## 2011-03-27 ENCOUNTER — Inpatient Hospital Stay (HOSPITAL_COMMUNITY): Payer: 59

## 2011-03-27 DIAGNOSIS — R1013 Epigastric pain: Secondary | ICD-10-CM

## 2011-03-27 DIAGNOSIS — R748 Abnormal levels of other serum enzymes: Secondary | ICD-10-CM

## 2011-03-27 LAB — CBC
Hemoglobin: 13.2 g/dL (ref 12.0–15.0)
MCHC: 34.6 g/dL (ref 30.0–36.0)
WBC: 8 10*3/uL (ref 4.0–10.5)

## 2011-03-27 LAB — BASIC METABOLIC PANEL
CO2: 23 mEq/L (ref 19–32)
Chloride: 108 mEq/L (ref 96–112)
Creatinine, Ser: 0.67 mg/dL (ref 0.4–1.2)
GFR calc Af Amer: >60 mL/min (ref >60)
Potassium: 4 mEq/L (ref 3.5–5.1)
Sodium: 140 mEq/L (ref 135–145)

## 2011-03-27 LAB — URINE DRUGS OF ABUSE SCREEN W ALC, ROUTINE (REF LAB)
Amphetamine Screen, Ur: NEGATIVE
Barbiturate Quant, Ur: NEGATIVE
Benzodiazepines.: POSITIVE — AB
Cocaine Metabolites: NEGATIVE
Creatinine,U: 38.8 mg/dL
Opiate Screen, Urine: NEGATIVE

## 2011-03-27 LAB — HEPATIC FUNCTION PANEL
Albumin: 3.3 g/dL — ABNORMAL LOW (ref 3.5–5.2)
Alkaline Phosphatase: 78 U/L (ref 39–117)
Total Protein: 6.2 g/dL (ref 6.0–8.3)

## 2011-03-27 LAB — MAGNESIUM: Magnesium: 2 mg/dL (ref 1.5–2.5)

## 2011-03-27 LAB — TSH: TSH: 4.771 u[IU]/mL — ABNORMAL HIGH (ref 0.350–4.500)

## 2011-03-27 LAB — LIPASE, BLOOD: Lipase: 185 U/L — ABNORMAL HIGH (ref 11–59)

## 2011-03-27 MED ORDER — IOHEXOL 300 MG/ML  SOLN
80.0000 mL | Freq: Once | INTRAMUSCULAR | Status: AC | PRN
  Administered 2011-03-27: 80 mL via INTRAVENOUS

## 2011-03-28 DIAGNOSIS — R1013 Epigastric pain: Secondary | ICD-10-CM

## 2011-03-28 DIAGNOSIS — K859 Acute pancreatitis without necrosis or infection, unspecified: Secondary | ICD-10-CM

## 2011-03-28 DIAGNOSIS — R197 Diarrhea, unspecified: Secondary | ICD-10-CM

## 2011-03-28 LAB — DIFFERENTIAL
Basophils Relative: 1 % (ref 0–1)
Eosinophils Absolute: 0.2 10*3/uL (ref 0.0–0.7)
Neutro Abs: 2 10*3/uL (ref 1.7–7.7)
Neutrophils Relative %: 35 % — ABNORMAL LOW (ref 43–77)

## 2011-03-28 LAB — CBC
Platelets: 238 10*3/uL (ref 150–400)
RBC: 3.99 MIL/uL (ref 3.87–5.11)
WBC: 5.6 10*3/uL (ref 4.0–10.5)

## 2011-03-28 LAB — COMPREHENSIVE METABOLIC PANEL
ALT: 27 U/L (ref 0–35)
AST: 24 U/L (ref 0–37)
Albumin: 3.4 g/dL — ABNORMAL LOW (ref 3.5–5.2)
Alkaline Phosphatase: 74 U/L (ref 39–117)
Chloride: 109 mEq/L (ref 96–112)
GFR calc Af Amer: >60 mL/min (ref >60)
Potassium: 4.5 mEq/L (ref 3.5–5.1)
Sodium: 142 mEq/L (ref 135–145)
Total Bilirubin: 0.2 mg/dL — ABNORMAL LOW (ref 0.3–1.2)

## 2011-03-29 DIAGNOSIS — K859 Acute pancreatitis without necrosis or infection, unspecified: Secondary | ICD-10-CM

## 2011-03-29 DIAGNOSIS — R1013 Epigastric pain: Secondary | ICD-10-CM

## 2011-03-29 LAB — BASIC METABOLIC PANEL
Chloride: 108 mEq/L (ref 96–112)
Creatinine, Ser: 0.66 mg/dL (ref 0.4–1.2)
GFR calc Af Amer: >60 mL/min (ref >60)
Potassium: 4.2 mEq/L (ref 3.5–5.1)
Sodium: 143 mEq/L (ref 135–145)

## 2011-03-30 ENCOUNTER — Ambulatory Visit: Payer: 59 | Admitting: Gastroenterology

## 2011-03-30 DIAGNOSIS — K859 Acute pancreatitis without necrosis or infection, unspecified: Secondary | ICD-10-CM

## 2011-03-30 DIAGNOSIS — R1013 Epigastric pain: Secondary | ICD-10-CM

## 2011-03-30 LAB — BENZODIAZEPINE, QUANTITATIVE, URINE
Flurazepam GC/MS Conf: NEGATIVE NG/ML
Lorazepam UR QT: NEGATIVE NG/ML
Nordiazepam GC/MS Conf: 1236 NG/ML — ABNORMAL HIGH

## 2011-03-30 LAB — HEPATIC FUNCTION PANEL
Albumin: 3.7 g/dL (ref 3.5–5.2)
Alkaline Phosphatase: 82 U/L (ref 39–117)
Total Protein: 6.6 g/dL (ref 6.0–8.3)

## 2011-03-30 LAB — LIPASE, BLOOD: Lipase: 114 U/L — ABNORMAL HIGH (ref 11–59)

## 2011-03-30 NOTE — H&P (Signed)
[name], [name]                   ACCOUNT NO.:  [id number]  MEDICAL RECORD NO.:  [id number]           PATIENT TYPE:  E  LOCATION:  [facility]                         FACILITY:  [facility]  PHYSICIAN:  Eduard Clos, MDDATE OF BIRTH:  20-Nov-1970  DATE OF ADMISSION:  03/26/2011 DATE OF DISCHARGE:                             HISTORY & PHYSICAL   PRIMARY GASTROENTEROLOGIST:  Barbette Hair. Arlyce Dice, MD,FACG  CHIEF COMPLAINT:  Abdominal pain.  HISTORY OF PRESENT ILLNESS:  This 40 year old female, who has had recent ERCP on Mar 14, 2011, for biliary dyskinesia and at that time, the patient had a sphincterotomy with placement of pancreatic duct stent, had developed some abdominal pain for the procedure and was found to have possible ERCP-induced pancreatitis, eventually was discharged home after being observed for possible aspiration, has been experiencing persistent pain in the abdomen, particularly epigastric which has worsened and was told to come to the ER by her gastroenterologist, Dr. Arlyce Dice.  At this time, the patient's lipase was found to be still high around 140, and the patient has been admitted to the hospitalist service as per the request of Dr. Arlyce Dice, and Dr. Arlyce Dice is going to see the patient later.  The patient stated abdominal pain is persistent and is not related to food, mostly in the epigastric, radiating to the back.  It is a dull aching constant pain.  Denies any chest pain, shortness of breath, or any nausea, vomiting, or diarrhea.  Denies any fever, chills.  Denies any dizziness, loss of consciousness, or any focal deficit.  The patient did have an acute abdominal series in the ER which did not show anything acute in the abdomen.  PAST MEDICAL HISTORY: 1. Hypertension. 2. ADD. 3. Bipolar disorder. 4. History of substance abuse.  PAST SURGICAL HISTORY: 1. Cholecystectomy. 2. Appendectomy. 3. Hysterectomy for endometriosis. 4. Recent ERCP 2 weeks ago. 5. Biliary  dyskinesia when the patient had sphincterotomy and stent     placed.  MEDICATIONS PRIOR TO ADMISSION:  To be verified include Concerta, Norvasc, Percocet, Protonix, Valium, Zofran, Zoloft.  ALLERGIES:  REGLAN, TOPAMAX, TEGRETOL.  FAMILY HISTORY:  Positive for renal cell carcinoma in her mom and coronary disease in grandparents.  SOCIAL HISTORY:  The patient smokes cigarettes, and she used to drink alcohol heavily, quit 4 years ago.  She used to abuse cocaine and other drugs, but she says she has stopped, almost 2 years now.  REVIEW OF SYSTEMS:  As per the history of present illness, nothing else significant.  PHYSICAL EXAMINATION:  GENERAL:  The patient was examined at bedside, not in acute distress. VITAL SIGNS:  Blood pressure is 104/78, pulse 66 per minute, temperature 98.7, respirations 18 per minute, O2 sat 99%. HEENT:  Anicteric.  No pallor.  No discharge from ears, eyes, nose, and mouth. CHEST:  Bilateral air entry present.  No rhonchi, no crepitation. HEART:  S1 and S2 heard. ABDOMEN:  Soft.  There is tenderness in the epigastric and left lower quadrant areas.  No guarding, no rigidity.  Mild discoloration from previous Lovenox shots. CENTRAL NERVOUS SYSTEM:  The patient is alert,  awake, and oriented to time, place, and person, moves upper and lower extremities, 5/5. EXTREMITIES:  Peripheral pulses felt.  No edema.  LABORATORY DATA:  Acute abdominal series shows borderline cardiomegaly without failure and no acute abnormality of the abdomen. CBC:  WBC 8.8, hemoglobin is 15.2, hematocrit is 44.2, platelets 227. Complete metabolic panel:  Sodium 138, potassium 3.4, chloride 102, carbon dioxide 24, glucose 77, BUN 12, creatinine 0.8, total bilirubin is 0.2, alk phos 94, AST 34, ALT 38, total protein 7.4, albumin 4, calcium is 9.8, amylase 134, lipase 140.  ASSESSMENT: 1. Abdominal pain, most likely from pancreatitis status post     endoscopic retrograde  cholangiopancreatography. 2. Recent endoscopic retrograde cholangiopancreatography with     sphincterotomy and pancreatic duct stent placement for biliary     dyskinesia. 3. Hypertension. 4. History of attention deficit disorder. 5. History of bipolar. 6. Previous history of polysubstance abuse.  PLAN: 1. At this time, I will admit the patient to medical floor. 2. For her abdominal pain, I did discuss with Dr. Arlyce Dice,     gastroenterologist, who advised a CT abdomen and pelvis at this     time with contrast.  We will keep the patient on clear liquid diet     and IV hydration, repeat labs in a.m.  We will follow     gastroenterologist's recommendations. 3. We need to verify her home medication and continue whichever is     clinically appropriate. 4. Further recommendation based on the test order and clinical course.     Eduard Clos, MD     ANK/MEDQ  D:  03/26/2011  T:  03/27/2011  Job:  [id number]  Electronically Signed by Midge Minium MD on 03/30/2011 06:27:19 AM

## 2011-03-31 LAB — STOOL CULTURE

## 2011-04-02 LAB — CBC
MCH: 31.2 pg (ref 26.0–34.0)
MCV: 92.6 fL (ref 78.0–100.0)
Platelets: 242 10*3/uL (ref 150–400)
RBC: 4.3 MIL/uL (ref 3.87–5.11)

## 2011-04-03 DIAGNOSIS — K859 Acute pancreatitis without necrosis or infection, unspecified: Secondary | ICD-10-CM

## 2011-04-03 DIAGNOSIS — R1013 Epigastric pain: Secondary | ICD-10-CM

## 2011-04-04 NOTE — Discharge Summary (Signed)
NAMEMURPHY, BUNDICK                   ACCOUNT NO.:  [id number]  MEDICAL RECORD NO.:  [id number]           PATIENT TYPE:  I  LOCATION:  1304                         FACILITY:  [facility]  PHYSICIAN:  Barbette Hair. Arlyce Dice, MD,FACGDATE OF BIRTH:  01-Aug-1971  DATE OF ADMISSION:  03/14/2011 DATE OF DISCHARGE:  03/17/2011                              DISCHARGE SUMMARY   ADMITTING DIAGNOSES: 93. A 40 year old female with possible aspiration event during     endoscopic retrograde cholangiopancreatography. 2. Biliary dyskinesia status post endoscopic retrograde     cholangiopancreatography and sphincterotomy on day of admission     with placement of pancreatic duct stent. 3. Probable post endoscopic retrograde cholangiopancreatography     pancreatitis. 4. History of depression/bipolar disorder. 5. Attention-deficit disorder. 6. History of substance abuse, inactive. 7. History of hypertension.  DISCHARGE DIAGNOSES: 1. No evidence for aspiration pneumonia. 2. Resolving post endoscopic retrograde cholangiopancreatography     pancreatitis, mild. 3. Biliary dyskinesia status post endoscopic retrograde     cholangiopancreatography and sphincterotomy on Mar 14, 2011, with     placement of pancreatic duct stent. 4. History of depression/bipolar disorder. 5. Attention-deficit disorder. 6. History of substance abuse, inactive. 7. History of hypertension.  CONSULTATIONS:  None.  PROCEDURES:  None.  LABORATORY STUDIES:  On admission May 8:  WBC 13.8, hemoglobin 14.7, hematocrit of 43.6, total bilirubin 0.7, alkaline phosphatase 127, SGOT 969, SGPT 343, amylase 125, lipase 126.  Followup on May 10 showed WBC of 6.6, hemoglobin 12.2, hematocrit of 37.5.  Liver function studies showed total bilirubin of 0.2, alkaline phosphatase 115, SGOT of 121, SGPT of 167, lipase of 2250 and then followup on May 11 amylase 275, lipase 292.  X-RAY STUDIES:  Chest x-ray on May 8 showed some right upper  lobe consolidation, could not exclude aspiration and KUB on May 9 showed interval migration of the biliary pancreatic stent into the right lower quadrant, likely in the distal small bowel.  BRIEF HISTORY:  [name] is a 39 year old female known recently to Dr. Arlyce Dice who has undergone outpatient workup for a 76-month history of recurrent intense epigastric pain associated with nausea.  She is status post a prior cholecystectomy.  MRCP showed a stable mild biliary ductal dilation at 12 mm.  There was no evidence of choledocholithiasis.  The patient had one documented set of liver function studies which were mildly elevated since the onset of her pain.  She has had a progression of her pain now with increased intensity and frequency and the decision was made to proceed with ERCP.  The patient underwent the ERCP on the day of admission, had a probable slight aspiration event during the procedure and was admitted to observation afterwards.  In addition, shortly after awaking from her anesthesia, she was also complaining of upper abdominal pain which was most consistent with a post ERCP pancreatitis.  HOSPITAL COURSE:  The patient was admitted to the service of Dr. Melvia Heaps.  She was kept on liberal IV fluids, analgesics and antiemetics. Initially kept n.p.o.  She did have a chest x-ray which showed some right upper lobe consolidation.  However, she never manifested a fever and her white count normalized within 24 hours of admission and she was otherwise asymptomatic from a pulmonary standpoint with no cough or sputum production or shortness of breath.  She did continue to complain of fairly intense upper abdominal pain which was managed with Dilaudid. Laboratories were consistent with a post ERCP pancreatitis, mild, with no worrisome parameters.  She also had a significant bump in her LFTs post ERCP, which quickly normalized.  She was able to start on a liquid diet on May 9 and was feeling  well enough to be ambulatory.  We gradually advanced her diet and converted her to oral analgesics and by May 11 she was felt stable for discharge to home.  She was to maintain a full liquid to very soft low-fat diet over the next 48 hours and then gradually advance to a low-fat diet.  She has a followup appointment with Dr. Arlyce Dice on May 24 at 10:30 a.m. and is to call for any problems in the interim.  MEDICATIONS ON DISCHARGE: 1. Percocet 5/325 q.4 hours as needed for pain. 2. Zofran 4 mg q.6 hours as needed for pain. 3. Zoloft 100 mg daily as previous. 4. Concerta 18 mg p.o. daily as previous. 5. Protonix 40 mg daily. 6. Norvasc 10 mg daily.  CONDITION ON DISCHARGE:  Stable and improved.     Kareena Esterwood, PA-C   ______________________________ Barbette Hair Arlyce Dice, MD,FACG    AE/MEDQ  D:  03/17/2011  T:  03/17/2011  Job:  [id number]  cc:   Barbette Hair. Arlyce Dice, MD,FACG [address] [place] [address]  Electronically Signed by Mike Gip PA-C on 03/17/2011 02:24:49 PM Electronically Signed by Melvia Heaps MDFACG on 04/04/2011 10:23:02 AM

## 2011-04-04 NOTE — H&P (Signed)
[name], [name]                   ACCOUNT NO.:  [id number]  MEDICAL RECORD NO.:  [id number]           PATIENT TYPE:  I  LOCATION:  1304                         FACILITY:  [facility]  PHYSICIAN:  Barbette Hair. Arlyce Dice, MD,FACGDATE OF BIRTH:  1971/08/08  DATE OF ADMISSION:  03/14/2011 DATE OF DISCHARGE:                             HISTORY & PHYSICAL   CHIEF COMPLAINT: 1. Acute abdominal pain. 2. Possible aspiration event with ERCP.  HISTORY:  [name] is a 40 year old white female known recently to Dr. Arlyce Dice, who has been undergoing workup for recurrent epigastric pain which has been occurring over the past 4 months.  These episodes had been occurring one to two times per week, and at times, the pain has been intense.  She says generally it lasts anywhere from 15 minutes to a couple of hours and is generally associated with nausea without vomiting.  She had not had any documented fever or chills.  Her appetite had been decreased, and she is eating less because she is afraid of bringing on the pain.  She has subsequently lost about 10 pounds.  She has been noted to have mildly elevated LFTs at one point within the past couple of months and therefore was set up for an MRI and MRCP.  This was done on February 24, 2011, and showed a stable mild biliary ductal dilation status post cholecystectomy.  Common bile duct is 12 mm and tapers distally.  There was no evidence of choledocholithiasis and otherwise was negative exam.  She had been given a trial of Levsin sublingual which initially seemed to help, but over the past couple of episodes, this has not relieved her pain.  She is on Protonix daily for GERD as well.  The patient had an intense episode on February 27, 2010, which took her to the emergency room.  She unfortunately did not have liver function studies done at that time.  She then had another episode on the evening of the 25th and was subsequently seen in the office.  At that time, she had liver  studies done, which were normal and was given Percocet to use as needed for pain and scheduled for ERCP with Dr. Arlyce Dice.  It is felt that she likely has a biliary dyskinesia.  The patient underwent ERCP with propofol sedation on Mar 14, 2011 per Dr. Arlyce Dice.  During the procedure, she was felt to have a small aspiration episode and was therefore admitted to observation postprocedure. Shortly after waking from the procedure, she also began complaining of intense upper abdominal pain.  LABORATORY STUDIES:  On May 8th, WBC was 13.8, hemoglobin 14.7, hematocrit of 43.6, total bilirubin 0.7, direct bilirubin 0.4, alk phos 127, SGOT 969, SGPT 343, amylase of 125, and lipase of 126.  HOME MEDICATIONS: 1. Protonix 40 mg daily. 2. Zoloft 100 mg p.o. daily. 3. Concerta 18 mg q.a.m. 4. Lorazepam 1 mg three times daily as needed. 5. Amlodipine 10 mg p.o. daily. 6. Percocet as needed for pain.  ALLERGIES:  Intolerances: 1. IMITREX. 2. MORPHINE. 3. REGLAN. 4. TEGRETOL. 5. TOPAMAX.  PAST MEDICAL HISTORY:  Pertinent for:  1. Previous cholecystectomy. 2. History of ADD. 3. History of depression. 4. Hypertension. 5. Previous history of substance abuse/cocaine. 6. History of bipolar disorder.  SURGICAL HISTORY:  The patient is status post: 1. Appendectomy as listed above. 2. Abdominal hysterectomy. 3. C-section. 4. Appendectomy. 5. Had a knee repair.  FAMILY HISTORY:  Pertinent for renal cancer in her mother and heart disease on the maternal side of the family as well.  SOCIAL HISTORY:  The patient lives with her significant other.  She is a smoker, approximately one pack per day.  No current EtOH or substance use.  REVIEW OF SYSTEMS:  GENERAL:  Negative other than recent weight loss. GI:  As outlined above.  GU:  Denies any dysuria, urgency, or frequency. NEURO:  Denies.  MUSCULOSKELETAL:  Denies.  PSYCH:  Pertinent for anxiety and depression.  SKIN:  Denies.  CARDIOVASCULAR:  Denies  any chest pain or anginal symptoms.  PULMONARY:  No cough, shortness of breath, or sputum production.  All other review of systems negative except as outlined in the HPI.  PHYSICAL EXAM:  GENERAL:  Well-developed white female in no acute distress, somewhat sedated postprocedure. VITAL SIGNS:  Blood pressure 120/70, pulse in the 60s.  She is afebrile. O2 sats 98 on room air. HEENT:  Nontraumatic, normocephalic.  EOMI, PERRLA.  Sclerae anicteric. NECK:  Supple.  There is no JVD.  No bruit. CARDIOVASCULAR:  Regular rate and rhythm with S1 and S2.  No murmur, rub, or gallop. PULMONARY:  Clear bilaterally. ABDOMEN:  Soft.  She is tender across the upper abdomen.  Bowel sounds are quiet.  There is no mass or palpable hepatosplenomegaly. EXTREMITIES:  No clubbing, cyanosis, or edema. SKIN:  Warm and dry and benign. PSYCH:  Mood and affect are normal and appropriate.  IMPRESSION: 1. A 40 year old female status post cholecystectomy with a 22-month     history of intermittent recurrent intense epigastric pain with     nausea, rule out biliary dyskinesia, rule out ampullary stenosis. 2. Status post cholecystectomy, appendectomy, and hysterectomy. 3. History of depression/bipolar disorder. 4. Attention deficit disorder. 5. Hypertension.  PLAN:  The patient is admitted to the service of Dr. Melvia Heaps post ERCP for supportive medical management.  She will be observed for any evidence of pneumonia with possible slight aspiration during her procedure.  We will obtain a chest x-ray and follow CBCs.  She will be kept on IV fluid hydration, and due to acute onset of upper abdominal pain and likelihood of post ERCP pancreatitis, she will be medically supported for this as well.  For details, please see the orders.     Cotina Esterwood, PA-C   ______________________________ Barbette Hair Arlyce Dice, MD,FACG    AE/MEDQ  D:  03/17/2011  T:  03/17/2011  Job:  [id number]  Electronically Signed by Olanna  ESTERWOOD PA-C on 03/20/2011 12:56:10 PM Electronically Signed by Melvia Heaps MDFACG on 04/04/2011 10:23:06 AM

## 2011-04-12 ENCOUNTER — Telehealth: Payer: Self-pay | Admitting: Gastroenterology

## 2011-04-12 NOTE — Telephone Encounter (Signed)
Left message for pt to call back  °

## 2011-04-12 NOTE — Telephone Encounter (Signed)
Pt calling stating she is still having abdominal pain and requests an appt, states she was supposed to f/u following hospital visit. Pt scheduled to see Willette Cluster NP 04/13/11@10 :30am. Pt aware of appt date and time.

## 2011-04-13 ENCOUNTER — Ambulatory Visit (INDEPENDENT_AMBULATORY_CARE_PROVIDER_SITE_OTHER): Payer: 59 | Admitting: Nurse Practitioner

## 2011-04-13 ENCOUNTER — Encounter: Payer: Self-pay | Admitting: Nurse Practitioner

## 2011-04-13 ENCOUNTER — Telehealth: Payer: Self-pay | Admitting: Nurse Practitioner

## 2011-04-13 VITALS — BP 94/68 | HR 92 | Ht 66.0 in | Wt 162.0 lb

## 2011-04-13 DIAGNOSIS — R1013 Epigastric pain: Secondary | ICD-10-CM

## 2011-04-13 DIAGNOSIS — F1911 Other psychoactive substance abuse, in remission: Secondary | ICD-10-CM

## 2011-04-13 DIAGNOSIS — F1910 Other psychoactive substance abuse, uncomplicated: Secondary | ICD-10-CM

## 2011-04-13 MED ORDER — PANCRELIPASE (LIP-PROT-AMYL) 15000-51000 UNITS PO CPEP: ORAL_CAPSULE | ORAL | Status: DC

## 2011-04-13 MED ORDER — OXYCODONE HCL 5 MG PO TABS: ORAL_TABLET | ORAL | Status: DC

## 2011-04-13 MED ORDER — FENTANYL 25 MCG/HR TD PT72: MEDICATED_PATCH | TRANSDERMAL | Status: DC

## 2011-04-13 MED ORDER — FENTANYL 12 MCG/HR TD PT72: 1.0000 | MEDICATED_PATCH | TRANSDERMAL | Status: DC

## 2011-04-13 MED ORDER — OXYCODONE HCL 5 MG PO TABS: 5.0000 mg | ORAL_TABLET | Freq: Four times a day (QID) | ORAL | Status: DC

## 2011-04-13 NOTE — Progress Notes (Signed)
[name] [id number] 1970/12/04   HISTORY OR PRESENT ILLNESS : [name] is here for hospital follow up. The original epigastric pain,for which she underwent ERCP with sphincterotomy, has resolved. Unfortunately.[name] had post-ERCP pancreatitis and has been slow to recover. Her lipase did finally normalize and her pain did improve but but she is still requiring Percocet every four hours, despite the Duragesic patch. No nausea, vomiting or diarrhea. Her weight has stabilized. She is adhering to a very low fat diet.   Current Medications, Allergies, Past Medical History, Past Surgical History, Family History and Social History were reviewed in [employer] record.   PHYSICAL EXAMINATION : General: Well developed white female no acute distress Head: Normocephalic and atraumatic Eyes:  sclerae anicteric,conjunctive pink. Ears: Normal auditory acuity Mouth: No deformity or lesions Neck: Supple, no masses.  Lungs: Clear throughout to auscultation Heart: Regular rate and rhythm; no murmurs heard Abdomen: Soft, nondistended, diffusely tender No masses or hepatomegaly noted. Normal bowel sounds Rectal: Not done Musculoskeletal: Symmetrical with no gross deformities  Skin: No lesions on visible extremities Extremities: No edema or deformities noted Neurological: Alert oriented x 4, grossly nonfocal Cervical Nodes:  No significant cervical adenopathy Psychological:  Alert and cooperative. Normal mood and affect  ASSESSMENT AND PLAN :

## 2011-04-13 NOTE — Telephone Encounter (Signed)
Spoke with Jill Side and told her I would need written authorization from patient allowing me to give information

## 2011-04-13 NOTE — Patient Instructions (Signed)
Take pain medication as directed. Your goal is to be off of all pain medications in 1 month. See Dr. Arlyce Dice in 1 month for follow-up. All pain medications were printed off and given to you to take to your pharmacy. Zenpep was sent electronically to your pharmacy.  Samples of this has been given to you as well with discount card.

## 2011-04-19 NOTE — Assessment & Plan Note (Signed)
Persistent epigastric pain after episode of post -ERCP pancreatitis. During recent admission her CTscan was unremarkable. Lipase was 160-180's but did normalize prior to discharge. Still requiring OxyIR every six hours and has Duragesic patch as well. Need to start weaning her from narcotics. Add pancreatitic suppositories to see if any benefit. Decrease Duragesic patch to Q72 hours x 1 then discontinue. Instructions given for a tapering dose of the OxyIR. Return to see Dr.Kaplan in one month. Goal is to be off narcotics by time of next visit. Should she have recurrent, severe pain, patient will call office for sooner appt.

## 2011-04-19 NOTE — Assessment & Plan Note (Signed)
Goal is to wean patient off pain medications to hopefully avoid any recurrent substance abuse problems.

## 2011-04-20 NOTE — Progress Notes (Signed)
Reviewed and agree with management. Rollyn Scialdone D. Amadea Keagy, M.D., FACG  

## 2011-05-01 ENCOUNTER — Telehealth: Payer: Self-pay | Admitting: Gastroenterology

## 2011-05-01 ENCOUNTER — Emergency Department (HOSPITAL_COMMUNITY)
Admission: EM | Admit: 2011-05-01 | Discharge: 2011-05-01 | Disposition: A | Discharge status: Home / Self Care | Payer: 59 | Attending: Internal Medicine | Admitting: Internal Medicine

## 2011-05-01 DIAGNOSIS — R109 Unspecified abdominal pain: Secondary | ICD-10-CM | POA: Insufficient documentation

## 2011-05-01 DIAGNOSIS — R10812 Left upper quadrant abdominal tenderness: Secondary | ICD-10-CM | POA: Insufficient documentation

## 2011-05-01 DIAGNOSIS — K859 Acute pancreatitis without necrosis or infection, unspecified: Secondary | ICD-10-CM | POA: Insufficient documentation

## 2011-05-01 LAB — DIFFERENTIAL
Basophils Absolute: 0.1 10*3/uL (ref 0.0–0.1)
Eosinophils Relative: 3 % (ref 0–5)
Lymphocytes Relative: 55 % — ABNORMAL HIGH (ref 12–46)
Lymphs Abs: 4.3 10*3/uL — ABNORMAL HIGH (ref 0.7–4.0)
Monocytes Absolute: 0.5 10*3/uL (ref 0.1–1.0)

## 2011-05-01 LAB — COMPREHENSIVE METABOLIC PANEL
ALT: 22 U/L (ref 0–35)
Albumin: 4 g/dL (ref 3.5–5.2)
Alkaline Phosphatase: 69 U/L (ref 39–117)
BUN: 7 mg/dL (ref 6–23)
Calcium: 9.5 mg/dL (ref 8.4–10.5)
Creatinine, Ser: 0.53 mg/dL (ref 0.50–1.10)
GFR calc Af Amer: >60 mL/min (ref >60)
GFR calc non Af Amer: >60 mL/min (ref >60)
Total Bilirubin: 0.2 mg/dL — ABNORMAL LOW (ref 0.3–1.2)
Total Protein: 6.8 g/dL (ref 6.0–8.3)

## 2011-05-01 LAB — CBC
HCT: 39.9 % (ref 36.0–46.0)
MCHC: 32.8 g/dL (ref 30.0–36.0)
MCV: 93.2 fL (ref 78.0–100.0)
RDW: 13 % (ref 11.5–15.5)

## 2011-05-01 NOTE — Telephone Encounter (Signed)
On call note at 36. Pt awoke with upper abd pain. No N/V/fevers. She is concerned she has pancreatitis. Advised to go to ED for evaluation.

## 2011-05-02 NOTE — Consult Note (Signed)
[name], [name]                   ACCOUNT NO.:  [id number]  MEDICAL RECORD NO.:  [id number]  LOCATION:  [facility]LED                         FACILITY:  [facility]  PHYSICIAN:  Hillery Aldo, M.D.   DATE OF BIRTH:  Aug 26, 1971  DATE OF CONSULTATION:  05/01/2011 DATE OF DISCHARGE:  05/01/2011                                CONSULTATION   PRIMARY C[facility] PHYSIC[facility]  GASTROENTEROLOGIST:  Barbette Hair. Arlyce Dice, MD, Clementeen Graham.  CHIEF COMPLAINT:  Abdominal pain accompanied by nausea.  HISTORY OF PRESENT ILLNESS:  The patient is a 40 year old female with past medical history of recurrent abdominal pain dating back to almost a year ago.  The patient has had 10 emergency department visits and 2 inpatient evaluations for abdominal pain in the past year.  She now presents with recurrent pain, midepigastric, associated with nausea but no vomiting.  The patient was diagnosed with biliary dyskinesia back in June of last year and at that time underwent a cholecystectomy.  Her symptoms post cholecystectomy were intermittent but persistent and she ultimately underwent an ERCP with sphincterotomy in May 2012.  She was hospitalized again May 21 through June 7 with pancreatitis thought to be due to her previous ERCP.  She ultimately was discharged home on a combination of fentanyl 25 mcg patches and OxyIR with plans to taper the milk products post discharge.  The patient apparently did well post discharge and was able to taper off the narcotics but had the sudden onset of abdominal pain that began approximately 1 a.m. last night.  She states she ate a [place] salad at about 6:30 and tolerated that okay, but was awakened by the midepigastric pain radiating to the back.  Pain kept her awake all night long and was rated at 6 to 7 out of 10.  The pain was constant, sharp, associated with nausea but no vomiting.  The application of ice to the abdomen did help with the pain as did IV Dilaudid.  She has been  given a total of 3 mg of Dilaudid in the emergency department and we are asked to consult regarding inpatient hospitalization due to her ongoing need for narcotic pain management.  PAST MEDICAL HISTORY: 1. Pancreatitis. 2. Biliary dyskinesia status post ERCP and sphincterotomy with a     temporary pancreatic duct stent placed in May 2012. 3. History of migraine headaches. 4. History of esophageal stricture. 5. Nephrolithiasis with history of double-J stent placement. 6. Hypertension. 7. Attention deficit disorder. 8. History of bipolar disorder with suicide attempts. 9. History of polysubstance abuse.  PAST SURGICAL HISTORY: 1. Cholecystectomy. 2. Septoplasty with bilateral inferior turbinate reduction. 3. Hysterectomy secondary to endometriosis. 4. Cesarean section x2. 5. Knee surgery.  FAMILY HISTORY:  The patient's mother had renal cell carcinoma and was alcoholic.  The patient's father's history is unknown.  There are apparently problems with alcohol and substance abuse in her larger family.  SOCIAL HISTORY:  The patient is divorced x2 and currently lives with her significant other.  She has a history of heavy alcohol and polysubstance abuse including cocaine, IV heroin, and up to one fifth of alcohol per day.  However, she has  been clean and sober for several years.  She smokes a pack of tobacco daily.  She has an associate degree in [employer] and is currently raising puppies.  She has two teenage offsprings and is under fair amount of stress at home.  ALLERGIES: 1. CARBAMAZEPINE. 2. TOPIRAMATE. 3. SUMATRIPTAN. 4. MORPHINE. 5. METOCLOPRAMIDE.  CURRENT MEDICATIONS: 1. Zenpep 2 capsules p.o. t.i.d. q.a.c. 2. Zoloft 100 mg p.o. q.h.s. 3. Valium 10 mg p.o. t.i.d. p.r.n. 4. Protonix 40 mg p.o. daily. 5. Oxycodone 5 mg 1 tablet p.o. q.4-6h. p.r.n. 6. Ondansetron 4 mg p.o. q.6h. p.r.n. 7. Norvasc 10 mg p.o. daily. 8. Concerta 18 mg p.o. daily.  REVIEW OF  SYSTEMS:  Comprehensive 14-point review of systems is unremarkable, except as noted in the elements of the HPI above. Pertinent positives to include lack of good sleep hygiene and significant psychosocial stressors.  PHYSICAL EXAMINATION:  VITAL SIGNS:  Temperature 97.7, pulse 78, respirations 20, blood pressure 125/86, O2 saturation 100% on room air. GENERAL:  Well-developed, well-nourished Caucasian female in no acute distress. HEENT:  Normocephalic, atraumatic.  PERRL.  EOMI.  Oropharynx is clear. NECK:  Supple.  No thyromegaly, lymphadenopathy.  No jugular venous distention. CHEST:  Lungs are clear to auscultation bilaterally with good air movement. HEART:  Regular rate, rhythm.  No murmurs, rubs or gallops. ABDOMEN:  Soft, tender in the midepigastric area to palpation.  Positive bowel sounds x4. EXTREMITIES:  No clubbing, edema, cyanosis. SKIN:  Warm and dry.  No rashes. NEUROLOGIC:  The patient is alert and oriented x3.  Nonfocal. PSYCHIATRIC:  Affect slightly depressed.  LABORATORY DATA:  Lipase is 83.  Sodium is 142, potassium 3.3, chloride 105, bicarb 28, BUN 7, creatinine 0.53, glucose 94, calcium 9.5.  Liver function studies are within normal limits.  White blood cell count of 7.9, hemoglobin 13.1, hematocrit 39.9, platelets 285,000.  ASSESSMENT AND PLAN: 1. Recurrent pancreatitis:  The patient does have mild elevation of     the lipase and her symptoms are certainly consistent with a     pancreatitis flare.  The patient understands how to manage this in     an outpatient environment with clear liquids and to avoid fatty     foods until her symptoms have resolved and then to slowly begin her     to advance her diet from clear liquids as tolerated.  This point,     would put her back on the fentanyl patches with OxyIR for     breakthrough pain and slow upward advancement of her diet as     tolerated.  The patient feels she can manage this at home and if     not, I have  given her my pager number and she is instructed to page     me before 7 p.m. if her symptoms cannot be managed and we will     bring her in as direct admission.  The patient should follow up     with Dr. Arlyce Dice for nonresolution of symptoms over the next 2     weeks.  She has been given a prescription for fentanyl 25 mcg     patches (#10) and OxyIR 5 mg (#60).  Importance of good sleep     hygiene and reduction of stressors at home was also emphasized. 2. Hypokalemia:  The patient will be repleted with 40 mEq p.o.     potassium prior to discharge.  The patient can safely resume all of her preadmission medications and  follow [facility] with [facility] and Dr. Arlyce Dice as needed.  Time spent on this consultation including face-to-face time equals 1 hour.     Hillery Aldo, M.D.     CR/MEDQ  D:  05/01/2011  T:  05/01/2011  Job:  [id number]  cc:   Barbette Hair. Arlyce Dice, MD,FACG [address] [place] [address]  Electronically Signed by Hillery Aldo M.D. on 05/02/2011 06:04:01 PM

## 2011-05-11 ENCOUNTER — Telehealth: Payer: Self-pay | Admitting: Internal Medicine

## 2011-05-11 ENCOUNTER — Emergency Department (HOSPITAL_COMMUNITY)
Admission: EM | Admit: 2011-05-11 | Discharge: 2011-05-12 | Disposition: A | Discharge status: Home / Self Care | Payer: 59 | Attending: Emergency Medicine | Admitting: Emergency Medicine

## 2011-05-11 DIAGNOSIS — R10819 Abdominal tenderness, unspecified site: Secondary | ICD-10-CM | POA: Insufficient documentation

## 2011-05-11 DIAGNOSIS — R109 Unspecified abdominal pain: Secondary | ICD-10-CM | POA: Insufficient documentation

## 2011-05-11 NOTE — Telephone Encounter (Signed)
Patient called @9 :45 pm c/o nausea and abdominal discomfort. Chart reviewed. Had been having chronic pain of uncertain cause early this year and eventually underwent ERCP w/ sphincterotomy 2 months ago. Had post procedure pancreatitis but recovered. Has had recurrent pain on several occassions with minimal findings (CT, labs etc). Has hx substance abuse. As she felt that the pain was severe, and I am not familiar with this patient, she was advised to go to ER for evaluation and treatment.

## 2011-05-12 LAB — URINALYSIS, ROUTINE W REFLEX MICROSCOPIC
Bilirubin Urine: NEGATIVE
Glucose, UA: NEGATIVE mg/dL
Hgb urine dipstick: NEGATIVE
Ketones, ur: NEGATIVE mg/dL
Nitrite: NEGATIVE
Protein, ur: NEGATIVE mg/dL
Specific Gravity, Urine: 1.008 (ref 1.005–1.030)
Urobilinogen, UA: 1 mg/dL (ref 0.0–1.0)
pH: 7.5 (ref 5.0–8.0)

## 2011-05-12 LAB — URINE MICROSCOPIC-ADD ON

## 2011-05-12 LAB — DIFFERENTIAL
Basophils Absolute: 0 10*3/uL (ref 0.0–0.1)
Basophils Relative: 1 % (ref 0–1)
Eosinophils Absolute: 0.3 10*3/uL (ref 0.0–0.7)
Eosinophils Relative: 3 % (ref 0–5)
Lymphocytes Relative: 46 % (ref 12–46)
Lymphs Abs: 4 10*3/uL (ref 0.7–4.0)
Monocytes Absolute: 0.4 10*3/uL (ref 0.1–1.0)
Monocytes Relative: 5 % (ref 3–12)
Neutro Abs: 4 10*3/uL (ref 1.7–7.7)
Neutrophils Relative %: 46 % (ref 43–77)

## 2011-05-12 LAB — CBC
HCT: 41.7 % (ref 36.0–46.0)
Hemoglobin: 13.7 g/dL (ref 12.0–15.0)
MCH: 30.6 pg (ref 26.0–34.0)
MCHC: 32.9 g/dL (ref 30.0–36.0)
MCV: 93.3 fL (ref 78.0–100.0)
Platelets: 281 10*3/uL (ref 150–400)
RBC: 4.47 MIL/uL (ref 3.87–5.11)
RDW: 12.1 % (ref 11.5–15.5)
WBC: 8.7 10*3/uL (ref 4.0–10.5)

## 2011-05-12 LAB — COMPREHENSIVE METABOLIC PANEL
ALT: 27 U/L (ref 0–35)
AST: 28 U/L (ref 0–37)
Albumin: 3.9 g/dL (ref 3.5–5.2)
Alkaline Phosphatase: 76 U/L (ref 39–117)
BUN: 8 mg/dL (ref 6–23)
CO2: 30 mEq/L (ref 19–32)
Calcium: 9.2 mg/dL (ref 8.4–10.5)
Chloride: 102 mEq/L (ref 96–112)
Creatinine, Ser: 0.57 mg/dL (ref 0.50–1.10)
GFR calc Af Amer: >60 mL/min (ref >60)
GFR calc non Af Amer: >60 mL/min (ref >60)
Glucose, Bld: 90 mg/dL (ref 70–99)
Potassium: 3.6 mEq/L (ref 3.5–5.1)
Sodium: 141 mEq/L (ref 135–145)
Total Bilirubin: 0.2 mg/dL — ABNORMAL LOW (ref 0.3–1.2)
Total Protein: 7.4 g/dL (ref 6.0–8.3)

## 2011-05-12 LAB — LIPASE, BLOOD: Lipase: 39 U/L (ref 11–59)

## 2011-05-16 NOTE — Discharge Summary (Signed)
[name], [name]                   ACCOUNT NO.:  [id number]  MEDICAL RECORD NO.:  [id number]  LOCATION:  5158                         FACILITY:  [facility]  PHYSICIAN:  Iva Boop, MD,FACGDATE OF BIRTH:  07/27/1971  DATE OF ADMISSION:  03/26/2011 DATE OF DISCHARGE:  04/04/2011                              DISCHARGE SUMMARY   ADMITTING DIAGNOSES: 15. A 40 year old female with persistent abdominal pain and a recent     history of post endoscopic retrograde cholangiopancreatography     pancreatitis, most consistent with persistent pancreatitis. 2. History of biliary dyskinesia status post endoscopic retrograde     cholangiopancreatography, sphincterotomy, and temporary pancreatic     duct stent placement per Dr. Arlyce Dice, approximately 1 week previous. 3. Hypertension. 4. History of attention deficit disorder. 5. History of bipolar disorder. 6. Prior history of polysubstance abuse.  DISCHARGE DIAGNOSES: 1. Resolving severe abdominal pain felt secondary to post endoscopic     retrograde cholangiopancreatography pancreatitis.  The patient did     have persistent mildly elevated lipase, however, CT scan did not     show any evidence of significant pancreatitis.  Her pain somewhat     out of proportion to objective findings.  She may have also had a     superimposed gastroenteritis. 2. History of biliary dyskinesia with recent endoscopic retrograde     cholangiopancreatography and sphincterotomy complicated by post     endoscopic retrograde cholangiopancreatography pancreatitis earlier     in the month. 3. History of bipolar disorder. 4. Anxiety. 5. Prior history of substance abuse.  BRIEF HISTORY:  [name] is a 40 year old female, known to Dr. Arlyce Dice who had undergone ERCP on Mar 14, 2011, for probable biliary dyskinesia. Unfortunately, she did develop a post ERCP pancreatitis despite placement of a pancreatic duct stent.  She was hospitalized at [facility] and had a brief stay.  At  this time, the patient has continued to require pain medication and she returned to the emergency room due to persistent severe pain.  She was admitted on the Hospitalist Service with complaints of constant epigastric pain radiating to her back described as dull and aching.  No associated fever or chills.  No diarrhea.  She was admitted for supportive management and further diagnostic workup.  LABORATORY STUDIES:  On Mar 26, 2011, WBC of 8.8, hemoglobin 15.2, hematocrit of 44.2, MCV of 92.3, platelets 327.  On Apr 02, 2011, WBC of 5.7, hemoglobin 13.4, hematocrit of 39.8.  On admission, potassium was 3.4, glucose 77, BUN 12, creatinine 0.83, albumin 4.  Liver function studies normal with the exception of an ALT mildly elevated at 38. Amylase was elevated at 134.  On admission, amylase was 134, lipase was 140.  Serial lipases were done, lipase on Mar 27, 2011, was 185 on Mar 30, 2011, 114, and on Apr 02, 2011, 41.  TSH of 4.77.  Drug screen was positive for benzos.  Stool for C. diff PCR was negative.  Stool culture was negative.  X-RAY STUDIES:  CT of the abdomen and pelvis was done on Mar 27, 2011. Liver, pancreas, spleen, adrenal glands, and kidneys all appeared normal.  No evidence of inflammatory  process or abnormal fluid collection identified.  Plain films also normal, noted borderline cardiomegaly on chest x-ray.  HOSPITAL COURSE:  The patient was admitted to the Hospitalist Service and GI was consulted.  Eventually, she was transferred to the GI Service.  She was treated with analgesics, antiemetics, and bowel rest. Initially, did have some complaints of diarrhea which resolved and cultures were negative.  She required a Dilaudid PCA for pain relief and then had difficulty with insomnia and exhaustion.  We were eventually able to get her off of the Dilaudid PCA, switched her to oral pain medication which initially was not effective.  She was then given a Duragesic patch, which  did seem to help.  She still required p.r.n. Percocet.  Her diet was gradually advanced.  She really did not have much appetite, but was able to keep down p.o.'s.  It was felt that she had a resolving post ERCP pancreatitis, however, objectively CT scan did not show any evidence of pancreatitis.  Lipase was elevated and eventually normalized.  She was discharged to home on Apr 04, 2011, with instructions to follow up with Dr. Arlyce Dice in the office in 2 weeks.  She was to follow a low-fat diet.  Medications in addition to her usual home medications, she was given Duragesic patch as 25 mcg q.72 hours #5, and no refills, and OxyIR 5 mg q.6 hours p.r.n. pain.  Would hope that she can come off of narcotics over the next 1-2 weeks.  CONDITION ON DISCHARGE:  Stable and improved.     Denise Esterwood, PA-C   ______________________________ Iva Boop, MD,FACG    AE/MEDQ  D:  04/13/2011  T:  04/14/2011  Job:  [id number]  cc:   Barbette Hair. Arlyce Dice, MD,FACG [address] [place] [address]  Electronically Signed by Mike Gip PA-C on 05/02/2011 09:18:57 AM Electronically Signed by Stan Head MDFACG on 05/16/2011 03:31:06 PM

## 2011-05-18 NOTE — Assessment & Plan Note (Signed)
[facility]                                ON-CALL NOTE  NAME:[name], Denise E                          MRN:          [id number] DATE:03/26/2011                            DOB:          01/04/1971   TELEPHONE NOTE  [name] called this evening stating that she is having abdominal pain. She was seen in the ER 1-2 days ago for similar pain.  Amylase and lipase were slightly elevated.  She has a history of post-ERCP pancreatitis for which she was hospitalized about 10 days ago.  Pain is similar.  She is having persistent pain that is not reduced sufficiently with Percocet.  I explained to the patient that she should be on clear liquids only.  If the analgesics are not working sufficiently, she should return to the emergency room where she will have to be admitted for parenteral analgesics.    Barbette Hair. Arlyce Dice, MD,FACG    RDK/MedQ  DD: 03/26/2011  DT: 03/27/2011  Job #: [id number]

## 2011-08-21 LAB — URINALYSIS, ROUTINE W REFLEX MICROSCOPIC
Glucose, UA: NEGATIVE
Ketones, ur: 15 — AB
Nitrite: POSITIVE — AB
Protein, ur: >300 — AB
Specific Gravity, Urine: 1.041 — ABNORMAL HIGH
Urobilinogen, UA: 4 — ABNORMAL HIGH
pH: 5.5

## 2011-08-21 LAB — URINE CULTURE

## 2011-08-21 LAB — URINE MICROSCOPIC-ADD ON

## 2011-08-21 LAB — PREGNANCY, URINE: Preg Test, Ur: NEGATIVE

## 2011-08-22 LAB — BASIC METABOLIC PANEL
Calcium: 9.4
Creatinine, Ser: 0.75
GFR calc Af Amer: >60

## 2011-08-22 LAB — URINE MICROSCOPIC-ADD ON

## 2011-08-22 LAB — URINALYSIS, ROUTINE W REFLEX MICROSCOPIC
Bilirubin Urine: NEGATIVE
Leukocytes, UA: NEGATIVE
Nitrite: NEGATIVE
Specific Gravity, Urine: 1.012
pH: 7

## 2011-08-22 LAB — POCT URINE HEMOGLOBIN: Operator id: 2616013

## 2011-08-24 LAB — URINALYSIS, ROUTINE W REFLEX MICROSCOPIC
Protein, ur: NEGATIVE
Urobilinogen, UA: 0.2

## 2011-08-24 LAB — RAPID URINE DRUG SCREEN, HOSP PERFORMED
Amphetamines: NOT DETECTED
Benzodiazepines: NOT DETECTED
Opiates: NOT DETECTED
Tetrahydrocannabinol: NOT DETECTED

## 2011-08-24 LAB — URINE MICROSCOPIC-ADD ON

## 2011-12-10 ENCOUNTER — Ambulatory Visit (INDEPENDENT_AMBULATORY_CARE_PROVIDER_SITE_OTHER): Payer: 59 | Admitting: Physician Assistant

## 2011-12-10 VITALS — BP 106/70 | HR 67 | Temp 97.8°F | Resp 16 | Ht 65.0 in | Wt 181.8 lb

## 2011-12-10 DIAGNOSIS — M545 Low back pain, unspecified: Secondary | ICD-10-CM

## 2011-12-10 DIAGNOSIS — F988 Other specified behavioral and emotional disorders with onset usually occurring in childhood and adolescence: Secondary | ICD-10-CM

## 2011-12-10 MED ORDER — AMPHETAMINE-DEXTROAMPHETAMINE 15 MG PO TABS: 15.0000 mg | ORAL_TABLET | Freq: Three times a day (TID) | ORAL | Status: DC

## 2011-12-10 MED ORDER — KETOROLAC TROMETHAMINE 60 MG/2ML IM SOLN
60.0000 mg | Freq: Once | INTRAMUSCULAR | Status: AC
  Administered 2011-12-10: 60 mg via INTRAMUSCULAR

## 2011-12-10 MED ORDER — PREDNISONE 20 MG PO TABS: ORAL_TABLET | ORAL | Status: DC

## 2011-12-10 MED ORDER — PREDNISONE 20 MG PO TABS: 20.0000 mg | ORAL_TABLET | Freq: Every day | ORAL | Status: DC

## 2011-12-10 MED ORDER — AMPHETAMINE-DEXTROAMPHETAMINE 15 MG PO TABS: 15.0000 mg | ORAL_TABLET | Freq: Every day | ORAL | Status: DC

## 2011-12-11 NOTE — Progress Notes (Signed)
  Subjective:    Patient ID: [name], female    DOB: 01-01-1971, 41 y.o.   MRN: [id number]  HPI Patient presents with recurrent back pain. She has a long history of low back pain secondary to degenerative disc disease. Episodically symptoms flare. Episode occurred after she claimed the hardwood floors in her home. Symptoms present x5 days. No worsening. In the past, symptoms have been adequately treated with prednisone. She is using Suboxone, prescribed by pain management, to management of her chronic pain. She needs a nonnarcotic product to manage these acute symptoms.  No loss of bowel or bladder control. No pain referred to the groin or legs. No paresthesias. No weakness. No saddle anesthesia.   Review of Systems As above, otherwise remarkable for some OCD type symptoms (housecleaning is one of her OCD activities.).    Objective:   Physical Exam  Vitals reviewed. Constitutional: She is oriented to person, place, and time. Vital signs are normal. She appears well-developed and well-nourished.  HENT:  Head: Normocephalic and atraumatic.  Eyes: Pupils are equal, round, and reactive to light.  Neck: Normal range of motion. Neck supple. No thyromegaly present.  Cardiovascular: Normal rate, regular rhythm and normal heart sounds.   Pulmonary/Chest: Effort normal and breath sounds normal.  Musculoskeletal: She exhibits edema and tenderness.       Thoracic back: She exhibits tenderness and pain. She exhibits normal range of motion, no bony tenderness, no swelling, no edema, no deformity and no laceration.       Lumbar back: She exhibits tenderness and pain. She exhibits no bony tenderness, no swelling, no edema, no deformity, no laceration and no spasm.  Lymphadenopathy:    She has no cervical adenopathy.  Neurological: She is alert and oriented to person, place, and time. She has normal strength and normal reflexes. No cranial nerve deficit or sensory deficit. Coordination normal.  Skin:  Skin is warm and dry.  Psychiatric: She has a normal mood and affect.          Assessment & Plan:  1. Lumbar pain, strain. Prednisone taper. Rest. Heat.  2. ADD. Refill of Adderall provided, to be filled on or after 12/15/2011.

## 2011-12-27 ENCOUNTER — Telehealth: Payer: Self-pay | Admitting: Gastroenterology

## 2011-12-27 NOTE — Telephone Encounter (Signed)
Spoke with patient and she states she is having increased burning in esophagus recently. She is taking Protonix daily and will increase this to BID until OV. She is also c/o constipation/bloating. She has tried Miralax daily but only had small bowel movement. She will try Miralax Bid as needed. OV scheduled with Dr. Arlyce Dice on 01/01/12 at 10:30 AM. Hx epigastric pain, Pancreatitis post ERCP.

## 2011-12-27 NOTE — Telephone Encounter (Signed)
ok 

## 2012-01-01 ENCOUNTER — Telehealth: Payer: Self-pay | Admitting: Gastroenterology

## 2012-01-01 ENCOUNTER — Encounter: Payer: Self-pay | Admitting: Gastroenterology

## 2012-01-01 ENCOUNTER — Ambulatory Visit (INDEPENDENT_AMBULATORY_CARE_PROVIDER_SITE_OTHER): Payer: 59 | Admitting: Gastroenterology

## 2012-01-01 DIAGNOSIS — K5900 Constipation, unspecified: Secondary | ICD-10-CM | POA: Insufficient documentation

## 2012-01-01 NOTE — Telephone Encounter (Signed)
Pt calling to verify how often she is to take the linzess, reviewed with the pt that she is supposed to take it daily.

## 2012-01-01 NOTE — Patient Instructions (Signed)
We have given you Linzess samples today Call us in one week to let us know how your doing Decrease Protonix to once daily

## 2012-01-01 NOTE — Assessment & Plan Note (Addendum)
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 A structural abnormality is much less likely.  Recommendations #1 trial of linaclotide qd

## 2012-01-01 NOTE — Progress Notes (Signed)
History of Present Illness:  [name] has returned complaining of constipation and abdominal bloating. Although she has a daily bowel movement taking MiraLax twice a day bowels are very small caliber with a sense of incomplete evacuation. She complains of abdominal bloating. She  Has had occasional mild episodes of right upper quadrant pain reminiscent of the pain she experienced prior to sphincterotomy.   Review of Systems: Pertinent positive and negative review of systems were noted in the above HPI section. All other review of systems were otherwise negative.    Current Medications, Allergies, Past Medical History, Past Surgical History, Family History and Social History were reviewed in [facility] electronic medical record  Vital signs were reviewed in today's medical record. Physical Exam: General: Well developed , well nourished, no acute distress Abdomen is without masses, tenderness organomegaly

## 2012-01-02 ENCOUNTER — Ambulatory Visit (INDEPENDENT_AMBULATORY_CARE_PROVIDER_SITE_OTHER): Payer: 59 | Admitting: Physician Assistant

## 2012-01-02 ENCOUNTER — Encounter: Payer: Self-pay | Admitting: Physician Assistant

## 2012-01-02 VITALS — BP 112/77 | HR 80 | Temp 98.1°F | Resp 16 | Ht 65.5 in | Wt 185.6 lb

## 2012-01-02 DIAGNOSIS — R202 Paresthesia of skin: Secondary | ICD-10-CM

## 2012-01-02 DIAGNOSIS — R209 Unspecified disturbances of skin sensation: Secondary | ICD-10-CM

## 2012-01-02 DIAGNOSIS — M65839 Other synovitis and tenosynovitis, unspecified forearm: Secondary | ICD-10-CM

## 2012-01-02 DIAGNOSIS — M778 Other enthesopathies, not elsewhere classified: Secondary | ICD-10-CM

## 2012-01-02 DIAGNOSIS — M545 Low back pain, unspecified: Secondary | ICD-10-CM

## 2012-01-02 DIAGNOSIS — E3450 Androgen insensitivity syndrome, unspecified: Secondary | ICD-10-CM

## 2012-01-02 MED ORDER — AMBULATORY NON FORMULARY MEDICATION: 1.0000 [IU] | Status: DC | PRN

## 2012-01-02 NOTE — Patient Instructions (Signed)
Proceed with instructions from Dr. Arlyce Dice. Try massage therapy for your back pain, the PT may do this, too. If no better, we'll refer you to a back specialist.

## 2012-01-02 NOTE — Progress Notes (Signed)
  Subjective:    Patient ID: [name], female    DOB: 12-14-1970, 41 y.o.   MRN: [id number]  HPI Presents for evaluation of worsening back pain.  Previously pain would occur in episodes, now daily.  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  Very reluctant to use more pain medication-has a history of substance abuse and has worked very hard to reduce use of controlled substances.  Right wrist/thumb pain has developed since she started drawing again.  Drawing has been a wonderful stress reliever, so she does not intend to stop, but requests a wrist splint to help reduce the pain.  She is right hand dominant.  Quitting smoking efforts continue, but difficult while wife still smokes.  They agreed to quit together, using nicotine replacement patches, but [name] hasn't used hers yet, and doesn't seem inclined to.  Saw Dr. Arlyce Dice yesterday re: abdominal bloating and constipation. Trying a new medication.  Hopeful it will reduce her discomfort.  Esophageal spasms occur with reflux-triggering foods.  Tongue is burning, could it be a vitamin deficiency?   Review of Systems As above.    Objective:   Physical Exam  Constitutional: She is oriented to person, place, and time. Vital signs are normal. She appears well-developed and well-nourished. No distress.  HENT:  Head: Normocephalic and atraumatic.  Right Ear: Hearing normal.  Left Ear: Hearing normal.  Eyes: EOM are normal. Pupils are equal, round, and reactive to light.  Neck: Normal range of motion. Neck supple. No thyromegaly present.  Cardiovascular: Normal rate, regular rhythm and normal heart sounds.   Pulses:      Radial pulses are 2+ on the right side, and 2+ on the left side.       Dorsalis pedis pulses are 2+ on the right side, and 2+ on the left side.       Posterior tibial pulses are 2+ on the right side, and 2+ on the left side.    Pulmonary/Chest: Effort normal and breath sounds normal.  Musculoskeletal:       Right hand: She exhibits tenderness. She exhibits normal range of motion, normal capillary refill, no deformity and no swelling. Normal strength noted.       Hands: Lymphadenopathy:       Head (right side): No tonsillar, no preauricular, no posterior auricular and no occipital adenopathy present.       Head (left side): No tonsillar, no preauricular, no posterior auricular and no occipital adenopathy present.    She has no cervical adenopathy.       Right: No supraclavicular adenopathy present.       Left: No supraclavicular adenopathy present.  Neurological: She is alert and oriented to person, place, and time. No sensory deficit.  Skin: Skin is warm, dry and intact. No rash noted. No cyanosis or erythema. Nails show no clubbing.  Psychiatric: She has a normal mood and affect.          Assessment & Plan:  1. Back pain. Refer to PT for core strengthening,and massage.  Encouraged a massage in the community in the interim if able.    2. Wrist pain, DeQuervain's tenosynovitis. Thumb spica wrist splint.  3. Paresthesias. Await lab results.  Suspect this is reflux related, however if symptoms persist, re-eval.

## 2012-01-03 ENCOUNTER — Encounter: Payer: Self-pay | Admitting: Physician Assistant

## 2012-01-03 LAB — CBC WITH DIFFERENTIAL/PLATELET
Basophils Relative: 1 % (ref 0–1)
Hemoglobin: 13.7 g/dL (ref 12.0–15.0)
MCHC: 32.2 g/dL (ref 30.0–36.0)
Monocytes Relative: 6 % (ref 3–12)
Neutro Abs: 3.1 10*3/uL (ref 1.7–7.7)
Neutrophils Relative %: 47 % (ref 43–77)
Platelets: 313 10*3/uL (ref 150–400)
RBC: 4.78 MIL/uL (ref 3.87–5.11)

## 2012-01-05 ENCOUNTER — Encounter: Payer: Self-pay | Admitting: Physician Assistant

## 2012-01-06 ENCOUNTER — Emergency Department (HOSPITAL_COMMUNITY)
Admission: EM | Admit: 2012-01-06 | Discharge: 2012-01-06 | Disposition: A | Discharge status: Home / Self Care | Payer: 59 | Attending: Emergency Medicine | Admitting: Emergency Medicine

## 2012-01-06 ENCOUNTER — Encounter (HOSPITAL_COMMUNITY): Payer: Self-pay | Admitting: Emergency Medicine

## 2012-01-06 DIAGNOSIS — J029 Acute pharyngitis, unspecified: Secondary | ICD-10-CM | POA: Insufficient documentation

## 2012-01-06 DIAGNOSIS — K122 Cellulitis and abscess of mouth: Secondary | ICD-10-CM | POA: Insufficient documentation

## 2012-01-06 DIAGNOSIS — E785 Hyperlipidemia, unspecified: Secondary | ICD-10-CM | POA: Insufficient documentation

## 2012-01-06 DIAGNOSIS — I10 Essential (primary) hypertension: Secondary | ICD-10-CM | POA: Insufficient documentation

## 2012-01-06 MED ORDER — METHYLPREDNISOLONE SODIUM SUCC 125 MG IJ SOLR
125.0000 mg | Freq: Once | INTRAMUSCULAR | Status: AC
  Administered 2012-01-06: 125 mg via INTRAVENOUS

## 2012-01-06 MED ORDER — DIPHENHYDRAMINE HCL 50 MG/ML IJ SOLN
25.0000 mg | Freq: Once | INTRAMUSCULAR | Status: AC
  Administered 2012-01-06: 25 mg via INTRAVENOUS

## 2012-01-06 MED ORDER — METHYLPREDNISOLONE SODIUM SUCC 125 MG IJ SOLR
INTRAMUSCULAR | Status: AC
  Administered 2012-01-06: 125 mg via INTRAVENOUS
  Filled 2012-01-06: qty 2

## 2012-01-06 MED ORDER — DIPHENHYDRAMINE HCL 50 MG/ML IJ SOLN
INTRAMUSCULAR | Status: AC
  Administered 2012-01-06: 25 mg via INTRAVENOUS
  Filled 2012-01-06: qty 1

## 2012-01-06 NOTE — Discharge Instructions (Signed)
Uvulitis Uvulitis is redness and soreness (inflammation) of the uvula. The uvula is the small tongue-shaped piece of tissue in the back of your mouth.  CAUSES Infection is a common cause of uvulitis. Infection of the uvula can be either viral or bacterial. Infectious uvulitis usually only occurs in association with another condition, such as inflammation and infection of the mouth or throat. Other causes of uvulitis include:  Trauma to the uvula.   Swelling from excess fluid buildup (edema), which may be an allergic reaction.   Inhalation of irritants, such as chemical agents, smoke, or steam.  DIAGNOSIS Your caregiver can usually diagnose uvulitis through a physical examination. Bacterial uvulitis can be diagnosed through the results of the growth of samples of bodily substances taken from your mouth (cultures). HOME CARE INSTRUCTIONS   Rest as much as possible.   Young children may suck on frozen juice bars or frozen ice pops. Older children and adults may gargle with a warm or cold liquid to help soothe the throat. (Mix  tsp of salt in 8 oz of water, or use strong tea.)   Use a cool-mist humidifier to lessen throat irritation and cough.   Drink enough fluids to keep your urine clear or pale yellow.   While the throat is very sore, eat soft or liquid foods such as milk, ice cream, soups, or milk drinks.   Family members who develop a sore throat or fever should have a medical exam or throat culture.   If your child has uvulitis and is taking antibiotic medicine, wait 24 hours or until his or her temperature is near normal (less than 100 F [37.8 C]) before allowing him or her to return to school or day care.   Only take over-the-counter or prescription medicines for pain, discomfort, or fever as directed by your caregiver.  Ask when your test results will be ready. Make sure you get your test results. SEEK MEDICAL CARE IF:   You have an oral temperature above 102 F (38.9 C).     You develop large, tender lumps your the neck.   Your child develops a rash.   You cough up green, yellow-brown, or bloody substances.  SEEK IMMEDIATE MEDICAL CARE IF:   You develop any new symptoms, such as vomiting, earache, severe headache, stiff neck, chest pain, or trouble breathing or swallowing.   Your airway is blocked.   You develop more severe throat pain along with drooling or voice changes.  Document Released: 06/02/2004 Document Revised: 07/05/2011 Document Reviewed: 12/29/2010 [facility] Patient Information 2012 [place], [place].

## 2012-01-06 NOTE — ED Notes (Signed)
MD at bedside. Dr. Rosalia Hammers at bedside examining pt.

## 2012-01-06 NOTE — ED Notes (Signed)
Pt states her tongue has been burning for the past few days  Pt's speech is somewhat slurred when she talks

## 2012-01-06 NOTE — ED Provider Notes (Signed)
9:38 AM Patient notes that she is feeling better.  She is asking for breakfast.  She has been observed for greater than four hours w/o acute changes, and w subj. Improvement. She will be d/c w return precautions.  Gerhard Munch, MD 01/06/12 [phone]

## 2012-01-06 NOTE — ED Notes (Signed)
MD at bedside. 

## 2012-01-06 NOTE — ED Notes (Signed)
Pt states she is having pain in her throat and feels like it is swollen and c/o difficulty swallowing  Denies difficulty breathing at this time  Pt states she woke up feeling this way about 20 to 30 mins ago

## 2012-01-06 NOTE — ED Notes (Signed)
Pt lying in bed on right side lightly snoring, family at bedside, will monitor.

## 2012-01-06 NOTE — ED Notes (Signed)
Pt not allowed to eat or drink for now per Dr. Jeraldine Loots, pt verbalized understanding, will monitor.

## 2012-01-06 NOTE — ED Provider Notes (Signed)
History     CSN: [id number]  Arrival date & time 01/06/12  [name]   First MD Initiated Contact with Patient 01/06/12 0703      No chief complaint on file.   (Consider location/radiation/quality/duration/timing/severity/associated sxs/prior treatment) HPI  Patient presents this morning complaining that she feels like there is swelling in the back of her throat. She has some discomfort and difficulty swallowing. She did not have a sore throat which went to bed. She is not having any difficulty breathing. She denies any rash or lightheadedness. She has not had an allergic reaction  in the past and denies any exposure to new medications or foods.  Past Medical History  Diagnosis Date  . Anxiety   . Depression   . GERD (gastroesophageal reflux disease)   . Esophageal stricture   . Hyperlipemia   . Hypertension   . History of alcohol abuse   . Migraine headache   . History of cocaine abuse   . Bipolar disorder   . Pancreatitis, acute     Past Surgical History  Procedure Date  . Appendectomy   . Cholecystectomy   . Abdominal hysterectomy   . Cesarean section   . Knee surgery   . Nasal septum surgery     Family History  Problem Relation Age of Onset  . Colon cancer Neg Hx   . Heart disease Maternal Grandmother   . Kidney cancer Mother   . Hypertension Mother   . Cancer Mother   . Lung cancer Maternal Aunt     History  Substance Use Topics  . Smoking status: Current Some Day Smoker -- 0.3 packs/day    Types: Cigarettes  . Smokeless tobacco: Never Used  . Alcohol Use: No    OB History    Grav Para Term Preterm Abortions TAB SAB Ect Mult Living                  Review of Systems  Cardiovascular: Negative for chest pain.  All other systems reviewed and are negative.    Allergies  Imitrex; Morphine and related; Reglan; Tegretol; and Topamax  Home Medications   Current Outpatient Rx  Name Route Sig Dispense Refill  . ALPRAZOLAM 1 MG PO TABS Oral Take 1 mg  by mouth 3 (three) times daily as needed.    Marland Kitchen AMLODIPINE BESYLATE 10 MG PO TABS Oral Take 10 mg by mouth daily.      . AMPHETAMINE-DEXTROAMPHETAMINE 15 MG PO TABS Oral Take 1 tablet (15 mg total) by mouth 3 (three) times daily. May fill on/after 12/15/2011 90 tablet 0  . BUPRENORPHINE HCL-NALOXONE HCL 8-2 MG SL SUBL Sublingual Place 1 tablet under the tongue 1 day or 1 dose.    Marland Kitchen CLONIDINE HCL 0.1 MG PO TABS Oral Take 0.1 mg by mouth at bedtime.    Marland Kitchen LINACLOTIDE 290 MCG PO CAPS Oral Take 290 mcg by mouth daily.     Marland Kitchen PANTOPRAZOLE SODIUM 40 MG PO TBEC Oral Take 40 mg by mouth daily.      . SERTRALINE HCL 100 MG PO TABS Oral Take 100 mg by mouth daily.      Marland Kitchen PREDNISONE 20 MG PO TABS  Take 3 PO QAM x 3 days, then 2 PO QAM x 3 days, then 1 PO QAM x 3 days 18 tablet 0    BP 109/76  Pulse 79  Temp(Src) 98.1 F (36.7 C) (Oral)  Resp 20  SpO2 100%  Physical Exam  Vitals reviewed. Constitutional:  She is oriented to person, place, and time. She appears well-developed and well-nourished.  HENT:  Head: Normocephalic and atraumatic.  Right Ear: External ear normal.  Left Ear: External ear normal.       Enlarged swollen uvula, no tonsillar swelling or exudate.  Eyes: Conjunctivae are normal. Pupils are equal, round, and reactive to light.  Neck: Normal range of motion. Neck supple.  Cardiovascular: Normal rate, regular rhythm and normal heart sounds.   Pulmonary/Chest: Effort normal and breath sounds normal.  Abdominal: Soft.  Musculoskeletal: Normal range of motion.  Neurological: She is alert and oriented to person, place, and time.  Skin: Skin is warm and dry. No rash noted.    ED Course  Procedures (including critical care time)  Labs Reviewed - No data to display No results found.   No diagnosis found.    MDM  Patient given Solu-Medrol and Benadryl for uvulitis. Patient's care discussed with Dr. Jeraldine Loots and he seems care.       Hilario Quarry, MD 01/06/12 0730

## 2012-01-10 ENCOUNTER — Other Ambulatory Visit: Payer: Self-pay | Admitting: Gastroenterology

## 2012-01-11 MED ORDER — LINACLOTIDE 290 MCG PO CAPS: 290.0000 ug | ORAL_CAPSULE | Freq: Every day | ORAL | Status: DC

## 2012-01-11 NOTE — Telephone Encounter (Signed)
Medication sent to pharmacy, pt informed

## 2012-01-15 ENCOUNTER — Telehealth: Payer: Self-pay

## 2012-01-15 DIAGNOSIS — M549 Dorsalgia, unspecified: Secondary | ICD-10-CM

## 2012-01-15 MED ORDER — AMPHETAMINE-DEXTROAMPHETAMINE 15 MG PO TABS: 15.0000 mg | ORAL_TABLET | Freq: Three times a day (TID) | ORAL | Status: DC

## 2012-01-15 NOTE — Telephone Encounter (Signed)
Spoke with patient and notified that rx is ready to p/u.  Chart to [name] to review to see if we can refer to [facility] PT.  Patient states that [facility] was "a joke".

## 2012-01-15 NOTE — Telephone Encounter (Signed)
.  [facility]    PT REQUESTING ADDERALL REFILL,ALSO IS UNHAPPY WITH [facility]. WANTS TO GO TO [facility]    BEST PHONE  [phone]

## 2012-01-15 NOTE — Telephone Encounter (Signed)
Signed at TL desk. - OV needed 5/13.

## 2012-01-15 NOTE — Telephone Encounter (Signed)
Pt seen for this 09/19/11, last RF 11/17/11 (see phone message in chart). Also pls address request for new referral for PT

## 2012-01-16 NOTE — Telephone Encounter (Signed)
Please notify patient that PT referral to [facility] made.  If she hasn't heard about an appointment in the next 5-7 days, please call us.

## 2012-01-24 ENCOUNTER — Ambulatory Visit: Payer: 59 | Admitting: Physical Therapy

## 2012-01-25 ENCOUNTER — Ambulatory Visit (INDEPENDENT_AMBULATORY_CARE_PROVIDER_SITE_OTHER): Payer: 59 | Admitting: Physician Assistant

## 2012-01-25 VITALS — BP 122/84 | HR 84 | Temp 98.2°F | Resp 18 | Ht 65.0 in | Wt 194.0 lb

## 2012-01-25 DIAGNOSIS — J019 Acute sinusitis, unspecified: Secondary | ICD-10-CM

## 2012-01-25 DIAGNOSIS — R110 Nausea: Secondary | ICD-10-CM

## 2012-01-25 MED ORDER — ONDANSETRON 8 MG PO TBDP: 8.0000 mg | ORAL_TABLET | Freq: Three times a day (TID) | ORAL | Status: AC | PRN

## 2012-01-25 MED ORDER — GUAIFENESIN ER 1200 MG PO TB12: 1.0000 | ORAL_TABLET | Freq: Two times a day (BID) | ORAL | Status: DC | PRN

## 2012-01-25 MED ORDER — CEFDINIR 300 MG PO CAPS: 600.0000 mg | ORAL_CAPSULE | Freq: Every day | ORAL | Status: AC

## 2012-01-25 NOTE — Patient Instructions (Signed)
Use a saline nasal spray and Vaseline inside your nostrils for reducing the irritation, dryness, and discomfort.  Get LOTS of rest and drink at least 64 ounces of water daily.

## 2012-01-25 NOTE — Progress Notes (Signed)
  Subjective:    Patient ID: [name], female    DOB: Mar 23, 1971, 41 y.o.   MRN: [id number]  HPI  Presents wi th 10 days of URI-type symptoms, 3 days of wosening illness. Sore throat, congestion, ear pain/fullness.  Nausea, fatigue, HA.  Minimal cough.  Achey all over. Fever 99.9.  Wife, who is present today, had similar symptoms last week, diagnosed with sinusitis. No diarrhea.  No rash.  Review of Systems As above.    Objective:   Physical Exam  Vital signs noted. Well-developed, well nourished WF who is awake, alert and oriented, in NAD, but obviously not feeling well.Marland Kitchen HEENT: [place]/AT, PERRL, EOMI.  Sclera and conjunctiva are clear.  EAC are patent, TMs are normal in appearance. Nasal mucosa is pink and moist, with crusted material noted. OP is clear. Frontal and maxillary sinuses are tender. Neck: supple, non-tender, no lymphadenopathey, thyromegaly. Heart: RRR, no murmur Lungs: CTA Extremities: no cyanosis, clubbing or edema. Skin: warm and dry without rash.       Assessment & Plan:   1. Acute sinusitis, unspecified  Guaifenesin (MUCINEX MAXIMUM STRENGTH) 1200 MG TB12, cefdinir (OMNICEF) 300 MG capsule  2. Nausea  ondansetron (ZOFRAN-ODT) 8 MG disintegrating tablet   Supportive care. RTC if symptoms worsen/persist.

## 2012-02-07 ENCOUNTER — Other Ambulatory Visit: Payer: Self-pay

## 2012-02-07 ENCOUNTER — Encounter (HOSPITAL_COMMUNITY): Payer: Self-pay | Admitting: Emergency Medicine

## 2012-02-07 ENCOUNTER — Emergency Department (HOSPITAL_COMMUNITY): Payer: 59

## 2012-02-07 ENCOUNTER — Emergency Department (HOSPITAL_COMMUNITY)
Admission: EM | Admit: 2012-02-07 | Discharge: 2012-02-07 | Disposition: A | Discharge status: Home / Self Care | Payer: 59 | Attending: Emergency Medicine | Admitting: Emergency Medicine

## 2012-02-07 DIAGNOSIS — E785 Hyperlipidemia, unspecified: Secondary | ICD-10-CM | POA: Insufficient documentation

## 2012-02-07 DIAGNOSIS — X58XXXA Exposure to other specified factors, initial encounter: Secondary | ICD-10-CM | POA: Insufficient documentation

## 2012-02-07 DIAGNOSIS — S4990XA Unspecified injury of shoulder and upper arm, unspecified arm, initial encounter: Secondary | ICD-10-CM

## 2012-02-07 DIAGNOSIS — I10 Essential (primary) hypertension: Secondary | ICD-10-CM | POA: Insufficient documentation

## 2012-02-07 DIAGNOSIS — M25519 Pain in unspecified shoulder: Secondary | ICD-10-CM | POA: Insufficient documentation

## 2012-02-07 DIAGNOSIS — F319 Bipolar disorder, unspecified: Secondary | ICD-10-CM | POA: Insufficient documentation

## 2012-02-07 DIAGNOSIS — R110 Nausea: Secondary | ICD-10-CM | POA: Insufficient documentation

## 2012-02-07 DIAGNOSIS — K219 Gastro-esophageal reflux disease without esophagitis: Secondary | ICD-10-CM | POA: Insufficient documentation

## 2012-02-07 MED ORDER — KETOROLAC TROMETHAMINE 10 MG PO TABS: 10.0000 mg | ORAL_TABLET | Freq: Four times a day (QID) | ORAL | Status: AC | PRN

## 2012-02-07 MED ORDER — KETOROLAC TROMETHAMINE 60 MG/2ML IM SOLN
60.0000 mg | Freq: Once | INTRAMUSCULAR | Status: AC
  Administered 2012-02-07: 60 mg via INTRAMUSCULAR
  Filled 2012-02-07: qty 2

## 2012-02-07 NOTE — ED Notes (Signed)
Left arm pain x 2 days  Some nausea no dizziness or sob

## 2012-02-07 NOTE — ED Notes (Signed)
Pt is ambulatory with report of relief.

## 2012-02-07 NOTE — ED Provider Notes (Signed)
History     CSN: [id number]  Arrival date & time 02/07/12  9147   First MD Initiated Contact with Patient 02/07/12 [phone]      Chief Complaint  Patient presents with  . Arm Pain    (Consider location/radiation/quality/duration/timing/severity/associated sxs/prior treatment) HPI  Pt presents to the ED with complaints of left shoulder pain. She was doing laundry this weekend when she tweaked her arm. She admits to her arm hurting so much that she doesn't want to move it because it is painful. She denies chest pain, vomiting, wheezing, SOB. She states she told triage she has had some nausea over the past couple of days but these are not new symptoms. She has taken Ibuprofen and Tylenol at home but she is still having pain. Denies cardiac history.  Past Medical History  Diagnosis Date  . Anxiety   . Depression   . GERD (gastroesophageal reflux disease)   . Esophageal stricture   . Hyperlipemia   . Hypertension   . History of alcohol abuse   . Migraine headache   . History of cocaine abuse   . Bipolar disorder   . Pancreatitis, acute     Past Surgical History  Procedure Date  . Appendectomy   . Cholecystectomy   . Abdominal hysterectomy   . Cesarean section   . Knee surgery   . Nasal septum surgery     Family History  Problem Relation Age of Onset  . Colon cancer Neg Hx   . Heart disease Maternal Grandmother   . Kidney cancer Mother   . Hypertension Mother   . Cancer Mother   . Lung cancer Maternal Aunt     History  Substance Use Topics  . Smoking status: Current Some Day Smoker -- 0.3 packs/day    Types: Cigarettes  . Smokeless tobacco: Never Used  . Alcohol Use: No    OB History    Grav Para Term Preterm Abortions TAB SAB Ect Mult Living                  Review of Systems  All other systems reviewed and are negative.    Allergies  Imitrex; Morphine and related; Reglan; Tegretol; and Topamax  Home Medications   Current Outpatient Rx  Name Route  Sig Dispense Refill  . ALPRAZOLAM 1 MG PO TABS Oral Take 1 mg by mouth 3 (three) times daily as needed. For anxiety    . AMLODIPINE BESYLATE 10 MG PO TABS Oral Take 10 mg by mouth daily.     . AMPHETAMINE-DEXTROAMPHETAMINE 15 MG PO TABS Oral Take 1 tablet (15 mg total) by mouth 3 (three) times daily. 90 tablet 0  . BUPRENORPHINE HCL-NALOXONE HCL 8-2 MG SL SUBL Sublingual Place 1 tablet under the tongue 1 day or 1 dose.    Marland Kitchen CLONIDINE HCL 0.1 MG PO TABS Oral Take 0.1 mg by mouth at bedtime.    Marland Kitchen LINACLOTIDE 290 MCG PO CAPS Oral Take 290 mcg by mouth daily. 30 capsule 11  . PANTOPRAZOLE SODIUM 40 MG PO TBEC Oral Take 40 mg by mouth daily.      . SERTRALINE HCL 100 MG PO TABS Oral Take 100 mg by mouth daily.      Marland Kitchen KETOROLAC TROMETHAMINE 10 MG PO TABS Oral Take 1 tablet (10 mg total) by mouth every 6 (six) hours as needed for pain. 20 tablet 0    BP 123/87  Pulse 104  Temp 98.7 F (37.1 C)  Resp 16  SpO2 99%  Physical Exam  Nursing note and vitals reviewed. Constitutional: She appears well-developed and well-nourished. No distress.  HENT:  Head: Normocephalic and atraumatic.  Eyes: Pupils are equal, round, and reactive to light.  Neck: Normal range of motion. Neck supple.  Cardiovascular: Normal rate and regular rhythm.   Pulmonary/Chest: Effort normal.  Abdominal: Soft.  Musculoskeletal:       Right shoulder: She exhibits decreased range of motion (due to pain), tenderness (severe tenderness over the [facility] joint), bony tenderness (over the [facility] joint), pain and spasm. She exhibits no swelling, no effusion, no crepitus, no deformity and no laceration.  Neurological: She is alert.  Skin: Skin is warm and dry.    ED Course  Procedures (including critical care time)  Labs Reviewed - No data to display Dg Shoulder Left  02/07/2012  *RADIOLOGY REPORT*  Clinical Data: Left shoulder pain  LEFT SHOULDER - 2+ VIEW  Comparison: 12/25/2003  Findings: Normal alignment without fracture.  AC joint  unremarkable.  No significant arthropathy.  IMPRESSION: No acute osseous finding  Original Report Authenticated By: Judie Petit. Ruel Favors, M.D.     1. Arm injury       MDM    Date: 02/07/2012  Rate: 100  Rhythm: normal sinus rhythm  QRS Axis: normal  Intervals: normal  ST/T Wave abnormalities: normal  Conduction Disutrbances:none  Narrative Interpretation:   Old EKG Reviewed: unchanged Mar 29, 2009  Cardiac etiology considered but very unlikely as the arm is tender and painful to move. Patient requests Dr.Gramig for ortho referral, she also requests no narcotics due to being on Suboxone treatment.  Pt has been advised of the symptoms that warrant their return to the ED. Patient has voiced understanding and has agreed to follow-up with the PCP or specialist.         Dorthula Matas, PA 02/07/12 1032

## 2012-02-07 NOTE — Progress Notes (Signed)
Orthopedic Tech Progress Note Patient Details:  [name] June 13, 1971 [id number]  Other Ortho Devices Type of Ortho Device: Other (comment) Ortho Device Location: left shoulder Ortho Device Interventions: Application   Lynne Righi T 02/07/2012, 10:37 AM

## 2012-02-07 NOTE — ED Notes (Signed)
Patient denied recent arm injury, EKG was ordered for left shoulder and arm pain.

## 2012-02-07 NOTE — Discharge Instructions (Signed)
Athletic Injuries   Proper early treatment and rehabilitation leads to a quicker recovery for most athletic injuries. You may be able to return to your sport fully recovered in less time if you follow these general rules:   Rest. Rest the injury until movement is no longer painful. Using an injured joint or muscle will prolong the problem.   Elevate. Keep the injured area elevated until most of the swelling and pain are gone. If possible, keep the injured area above the level of your heart.   Ice. Use ice packs directly on the injury for 3 to 4 days.   Compression. Use an elastic bandage applied to your injury as directed. This will reduce swelling, although elastic wraps do not protect injured joints. More rigid splints and taping are better for this purpose.   Rehabilitation. This should begin as soon as the swelling and pain of your injury subside, and as directed by your caregiver. It includes exercises to improve joint motion and muscular strength. Occasionally special braces, splints, or orthotics are used to protect against further injury when you return to your sport.  Keeping a positive attitude will help you heal your injury more rapidly and completely. You may return to physical exercise that does not cause pain or increase the risk of re-injury or as directed. This will help maintain fitness. It will also improve your mental attitude. Do not overuse your injured extremity. This will lead to discomfort and may delay full recovery.   Document Released: 11/30/2004 Document Revised: 10/12/2011 Document Reviewed: 04/20/2009   [employer] Patient Information 2012 [employer].

## 2012-02-07 NOTE — ED Notes (Signed)
Arm sling placed by orthopedics with some pain relief reported by pain medication and sling.

## 2012-02-07 NOTE — ED Notes (Signed)
Orthopedics called for arm splint placement.

## 2012-02-07 NOTE — ED Provider Notes (Signed)
Medical screening examination/treatment/procedure(s) were performed by non-physician practitioner and as supervising physician I was immediately available for consultation/collaboration.  Dakiyah Heinke K Linker, MD 02/07/12 1055 

## 2012-02-07 NOTE — ED Notes (Signed)
Pt returned from radiology w/o incident. 

## 2012-02-13 ENCOUNTER — Encounter: Payer: Self-pay | Admitting: Internal Medicine

## 2012-02-14 ENCOUNTER — Telehealth: Payer: Self-pay

## 2012-02-14 MED ORDER — AMPHETAMINE-DEXTROAMPHETAMINE 15 MG PO TABS: 15.0000 mg | ORAL_TABLET | Freq: Three times a day (TID) | ORAL | Status: DC

## 2012-02-14 NOTE — Telephone Encounter (Signed)
PT IN NEED OF HER ADDERALL. PLEASE CALL [phone] WHEN READY

## 2012-02-14 NOTE — Telephone Encounter (Signed)
Signed at tl desk - remind pt she will need an ov in 5/13.

## 2012-02-14 NOTE — Telephone Encounter (Signed)
Informed pt of message

## 2012-02-18 ENCOUNTER — Ambulatory Visit: Payer: 59

## 2012-02-18 ENCOUNTER — Ambulatory Visit (INDEPENDENT_AMBULATORY_CARE_PROVIDER_SITE_OTHER): Payer: 59 | Admitting: Family Medicine

## 2012-02-18 VITALS — BP 104/71 | HR 91 | Temp 98.3°F | Resp 16 | Ht 65.0 in | Wt 189.0 lb

## 2012-02-18 DIAGNOSIS — M25519 Pain in unspecified shoulder: Secondary | ICD-10-CM

## 2012-02-18 MED ORDER — CYCLOBENZAPRINE HCL 5 MG PO TABS: 5.0000 mg | ORAL_TABLET | Freq: Three times a day (TID) | ORAL | Status: AC | PRN

## 2012-02-18 MED ORDER — KETOROLAC TROMETHAMINE 60 MG/2ML IM SOLN
60.0000 mg | Freq: Once | INTRAMUSCULAR | Status: AC
  Administered 2012-02-18: 60 mg via INTRAMUSCULAR

## 2012-02-18 MED ORDER — PREDNISONE 20 MG PO TABS: ORAL_TABLET | ORAL | Status: AC

## 2012-02-18 NOTE — Progress Notes (Signed)
  Subjective:    Patient ID: [name], female    DOB: [birth year], 41 y.o.   MRN: [id number]  HPI 41 yo female with shoulder pain.  Left.  Started when doing laundry 3 weks ago.  Tweaked shoulder.  Couldn't move it due to pain.  Went to [employer] eventually.  They were worried about a possible AC separation.   Was improving and then today had to break up dogs and now hurting all over again. NO previous history of injury to shoulder.  Hurts worse now.     Seen in ED 02/07/12.  Negative Xrays. Gave her toradol, put her in a sling.     Review of Systems Negative except as per HPI     Objective:   Physical Exam  Constitutional: She appears well-developed.  Pulmonary/Chest: Effort normal.  Musculoskeletal:       Left shoulder: She exhibits decreased range of motion, tenderness, bony tenderness, deformity, pain and spasm.  Neurological: She is alert.    Pain over AC joint on left.  Palpably, feels like larger space in [facility] on left.  Unable ot reach across to other shoulder due to pain.  Ext and Abduction only to 85 - 90 degrees before pain limits further motion.  Sits visibly guarding left shoulder.   [facility] Primary radiology reading by Dr. Georgiana Shore: No fracture, dislocation, or separation appreciated.       Assessment & Plan:  Shoulder pain - strain/spasm - toradol here.  Flexeril.  Continue sling for now.  If not improving, can try pred taper.  If still no better, refer to ortho.  Prefers Dr. Amanda Pea.

## 2012-02-21 ENCOUNTER — Other Ambulatory Visit: Payer: Self-pay | Admitting: Physician Assistant

## 2012-03-11 ENCOUNTER — Ambulatory Visit (INDEPENDENT_AMBULATORY_CARE_PROVIDER_SITE_OTHER): Payer: 59 | Admitting: Physician Assistant

## 2012-03-11 VITALS — BP 118/80 | HR 94 | Temp 98.6°F | Resp 18 | Ht 65.0 in | Wt 192.0 lb

## 2012-03-11 DIAGNOSIS — G5601 Carpal tunnel syndrome, right upper limb: Secondary | ICD-10-CM

## 2012-03-11 DIAGNOSIS — F988 Other specified behavioral and emotional disorders with onset usually occurring in childhood and adolescence: Secondary | ICD-10-CM

## 2012-03-11 DIAGNOSIS — G5600 Carpal tunnel syndrome, unspecified upper limb: Secondary | ICD-10-CM

## 2012-03-11 MED ORDER — AMPHETAMINE-DEXTROAMPHETAMINE 15 MG PO TABS: 15.0000 mg | ORAL_TABLET | Freq: Three times a day (TID) | ORAL | Status: DC

## 2012-03-11 NOTE — Progress Notes (Signed)
  Subjective:    Patient ID: [name], female    DOB: [birth year], 41 y.o.   MRN: [id number]  HPI  Presents with worsening right wrist carpal tunnel symptoms.  Initially had improvement with rest, but draws for stress relief and as she is right handed, exacerbates the symptoms.  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  She previously used a thumb spica splint for DeQuervains, but is not able to draw while wearing it. No elbow or axillary pain. No weakness.  Right hand feels "numb."  Additionally, needs a refill of Adderall.  Her current regimen is controlling her symptoms well, though she notes that her OCD tendencies have worsened some with increased family responsibilities recently.  Review of Systems As above.    Objective:   Physical Exam  Vital signs noted. Well-developed, well nourished WF who is awake, alert and oriented, in NAD. HEENT: [place]/AT, sclera and conjunctiva are clear.   Neck: supple, non-tender, no lymphadenopathy, thyromegaly. Heart: RRR, no murmur Lungs: CTA Extremities: no cyanosis, clubbing or edema. Positive phalen's on the right, negative tinel's. Normal sensation.  Good strength, symmetric. Skin: warm and dry without rash.     Assessment & Plan:   1. ADD (attention deficit disorder)  amphetamine-dextroamphetamine (ADDERALL, 15MG ,) 15 MG tablet  2. Carpal tunnel syndrome of right wrist  Wrist splint to right wrist. Anticipatory guidance.   Re-evaluate in 09/2012, sooner if needed.

## 2012-03-11 NOTE — Patient Instructions (Signed)
Wear the wrist splint at night and during the day when you are able.  Identify ways to reduce your stress level (like planning something fun every week and making it a priority).

## 2012-03-19 ENCOUNTER — Other Ambulatory Visit: Payer: Self-pay | Admitting: Physician Assistant

## 2012-03-22 ENCOUNTER — Other Ambulatory Visit: Payer: Self-pay | Admitting: Physician Assistant

## 2012-03-22 NOTE — Telephone Encounter (Signed)
PATIENT IS TO PICK UP ZOLOFT TODAY, BUT  PHARMACY CLOSES AT 7:30.  COULD WE HAVE IT SENT TO [facility] [address]?  PLEASE CALL Ahnya AND LET HER KNOW IF WE GET THIS CHANGED.

## 2012-03-23 NOTE — Telephone Encounter (Signed)
PHARMACY CHANGED TO [facility], CAN WE RX?

## 2012-04-15 ENCOUNTER — Encounter (HOSPITAL_COMMUNITY): Payer: Self-pay | Admitting: Emergency Medicine

## 2012-04-15 ENCOUNTER — Emergency Department (HOSPITAL_COMMUNITY)
Admission: EM | Admit: 2012-04-15 | Discharge: 2012-04-15 | Disposition: A | Discharge status: Home / Self Care | Payer: 59 | Attending: Emergency Medicine | Admitting: Emergency Medicine

## 2012-04-15 DIAGNOSIS — L255 Unspecified contact dermatitis due to plants, except food: Secondary | ICD-10-CM

## 2012-04-15 DIAGNOSIS — F17200 Nicotine dependence, unspecified, uncomplicated: Secondary | ICD-10-CM | POA: Insufficient documentation

## 2012-04-15 DIAGNOSIS — K219 Gastro-esophageal reflux disease without esophagitis: Secondary | ICD-10-CM | POA: Insufficient documentation

## 2012-04-15 DIAGNOSIS — L259 Unspecified contact dermatitis, unspecified cause: Secondary | ICD-10-CM | POA: Insufficient documentation

## 2012-04-15 DIAGNOSIS — I10 Essential (primary) hypertension: Secondary | ICD-10-CM | POA: Insufficient documentation

## 2012-04-15 DIAGNOSIS — E785 Hyperlipidemia, unspecified: Secondary | ICD-10-CM | POA: Insufficient documentation

## 2012-04-15 DIAGNOSIS — F319 Bipolar disorder, unspecified: Secondary | ICD-10-CM | POA: Insufficient documentation

## 2012-04-15 DIAGNOSIS — J069 Acute upper respiratory infection, unspecified: Secondary | ICD-10-CM

## 2012-04-15 DIAGNOSIS — Z79899 Other long term (current) drug therapy: Secondary | ICD-10-CM | POA: Insufficient documentation

## 2012-04-15 MED ORDER — BENZONATATE 100 MG PO CAPS: 100.0000 mg | ORAL_CAPSULE | Freq: Three times a day (TID) | ORAL | Status: AC

## 2012-04-15 MED ORDER — PREDNISONE 20 MG PO TABS: ORAL_TABLET | ORAL | Status: AC

## 2012-04-15 MED ORDER — DEXAMETHASONE SODIUM PHOSPHATE 10 MG/ML IJ SOLN
10.0000 mg | Freq: Once | INTRAMUSCULAR | Status: AC
  Administered 2012-04-15: 10 mg via INTRAMUSCULAR
  Filled 2012-04-15: qty 1

## 2012-04-15 NOTE — ED Notes (Signed)
Pt reports 1 week exposure to poison oak. Rash from ankles to knees. Unresponsive to OTC topical meds

## 2012-04-15 NOTE — ED Provider Notes (Signed)
Medical screening examination/treatment/procedure(s) were performed by non-physician practitioner and as supervising physician I was immediately available for consultation/collaboration.   Celene Kras, MD 04/15/12 1052

## 2012-04-15 NOTE — Discharge Instructions (Signed)
Contact Dermatitis Contact dermatitis is a reaction to certain substances that touch the skin. Contact dermatitis can be either irritant contact dermatitis or allergic contact dermatitis. Irritant contact dermatitis does not require previous exposure to the substance for a reaction to occur.Allergic contact dermatitis only occurs if you have been exposed to the substance before. Upon a repeat exposure, your body reacts to the substance.  CAUSES  Many substances can cause contact dermatitis. Irritant dermatitis is most commonly caused by repeated exposure to mildly irritating substances, such as:  Makeup.   Soaps.   Detergents.   Bleaches.   Acids.   Metal salts, such as nickel.  Allergic contact dermatitis is most commonly caused by exposure to:  Poisonous plants.   Chemicals (deodorants, shampoos).   Jewelry.   Latex.   Neomycin in triple antibiotic cream.   Preservatives in products, including clothing.  SYMPTOMS  The area of skin that is exposed may develop:  Dryness or flaking.   Redness.   Cracks.   Itching.   Pain or a burning sensation.   Blisters.  With allergic contact dermatitis, there may also be swelling in areas such as the eyelids, mouth, or genitals.  DIAGNOSIS  Your caregiver can usually tell what the problem is by doing a physical exam. In cases where the cause is uncertain and an allergic contact dermatitis is suspected, a patch skin test may be performed to help determine the cause of your dermatitis. TREATMENT Treatment includes protecting the skin from further contact with the irritating substance by avoiding that substance if possible. Barrier creams, powders, and gloves may be helpful. Your caregiver may also recommend:  Steroid creams or ointments applied 2 times daily. For best results, soak the rash area in cool water for 20 minutes. Then apply the medicine. Cover the area with a plastic wrap. You can store the steroid cream in the  refrigerator for a "chilly" effect on your rash. That may decrease itching. Oral steroid medicines may be needed in more severe cases.   Antibiotics or antibacterial ointments if a skin infection is present.   Antihistamine lotion or an antihistamine taken by mouth to ease itching.   Lubricants to keep moisture in your skin.   Burow's solution to reduce redness and soreness or to dry a weeping rash. Mix one packet or tablet of solution in 2 cups cool water. Dip a clean washcloth in the mixture, wring it out a bit, and put it on the affected area. Leave the cloth in place for 30 minutes. Do this as often as possible throughout the day.   Taking several cornstarch or baking soda baths daily if the area is too large to cover with a washcloth.  Harsh chemicals, such as alkalis or acids, can cause skin damage that is like a burn. You should flush your skin for 15 to 20 minutes with cold water after such an exposure. You should also seek immediate medical care after exposure. Bandages (dressings), antibiotics, and pain medicine may be needed for severely irritated skin.  HOME CARE INSTRUCTIONS  Avoid the substance that caused your reaction.   Keep the area of skin that is affected away from hot water, soap, sunlight, chemicals, acidic substances, or anything else that would irritate your skin.   Do not scratch the rash. Scratching may cause the rash to become infected.   You may take cool baths to help stop the itching.   Only take over-the-counter or prescription medicines as directed by your caregiver.     See your caregiver for follow-up care as directed to make sure your skin is healing properly.  SEEK MEDICAL CARE IF:   Your condition is not better after 3 days of treatment.   You seem to be getting worse.   You see signs of infection such as swelling, tenderness, redness, soreness, or warmth in the affected area.   You have any problems related to your medicines.  Document Released:  10/20/2000 Document Revised: 10/12/2011 Document Reviewed: 03/28/2011 [facility] Patient Information 2012 [place], [place].  Poison [employer] ivy is a inflammation of the skin (contact dermatitis) caused by touching the allergens on the leaves of the ivy plant following previous exposure to the plant. The rash usually appears 48 hours after exposure. The rash is usually bumps (papules) or blisters (vesicles) in a linear pattern. Depending on your own sensitivity, the rash may simply cause redness and itching, or it may also progress to blisters which may break open. These must be well cared for to prevent secondary bacterial (germ) infection, followed by scarring. Keep any open areas dry, clean, dressed, and covered with an antibacterial ointment if needed. The eyes may also get puffy. The puffiness is worst in the morning and gets better as the day progresses. This dermatitis usually heals without scarring, within 2 to 3 weeks without treatment. HOME CARE INSTRUCTIONS  Thoroughly wash with soap and water as soon as you have been exposed to poison ivy. You have about one half hour to remove the plant resin before it will cause the rash. This washing will destroy the oil or antigen on the skin that is causing, or will cause, the rash. Be sure to wash under your fingernails as any plant resin there will continue to spread the rash. Do not rub skin vigorously when washing affected area. Poison ivy cannot spread if no oil from the plant remains on your body. A rash that has progressed to weeping sores will not spread the rash unless you have not washed thoroughly. It is also important to wash any clothes you have been wearing as these may carry active allergens. The rash will return if you wear the unwashed clothing, even several days later. Avoidance of the plant in the future is the best measure. Poison ivy plant can be recognized by the number of leaves. Generally, poison ivy has three leaves with flowering  branches on a single stem. Diphenhydramine may be purchased over the counter and used as needed for itching. Do not drive with this medication if it makes you drowsy.Ask your caregiver about medication for children. SEEK MEDICAL CARE IF:  Open sores develop.   Redness spreads beyond area of rash.   You notice purulent (pus-like) discharge.   You have increased pain.   Other signs of infection develop (such as fever).  Document Released: 10/20/2000 Document Revised: 10/12/2011 Document Reviewed: 09/08/2009 [facility] Patient Information 2012 [place], [place].  Poison [facility] is an inflammation of the skin (contact dermatitis). It is caused by contact with the allergens on the leaves of the oak (toxicodendron) plants. Depending on your sensitivity, the rash may consist simply of redness and itching, or it may also progress to blisters which may break open (rupture). These must be well cared for to prevent secondary germ (bacterial) infection as these infections can lead to scarring. The eyes may also get puffy. The puffiness is worst in the morning and gets better as the day progresses. Healing is best accomplished by keeping any open areas dry, clean, covered  with a bandage, and covered with an antibacterial ointment if needed. Without secondary infection, this dermatitis usually heals without scarring within 2 to 3 weeks without treatment. HOME CARE INSTRUCTIONS When you have been exposed to poison oak, it is very important to thoroughly wash with soap and water as soon as the exposure has been discovered. You have about one half hour to remove the plant resin before it will cause the rash. This cleaning will quickly destroy the oil or antigen on the skin (the antigen is what causes the rash). Wash aggressively under the fingernails as any plant resin still there will continue to spread the rash. Do not rub skin vigorously when washing affected area. Poison oak cannot spread if no oil from the  plant remains on your body. Rash that has progressed to weeping sores (lesions) will not spread the rash unless you have not washed thoroughly. It is also important to clean any clothes you have been wearing as they may carry active allergens which will spread the rash, even several days later. Avoidance of the plant in the future is the best measure. Poison oak plants can be recognized by the number of leaves. Generally, poison oak has three leaves with flowering branches on a single stem. Diphenhydramine may be purchased over the counter and used as needed for itching. Do not drive with this medication if it makes you drowsy. Ask your caregiver about medication for children. SEEK IMMEDIATE MEDICAL CARE IF:   Open areas of the rash develop.   You notice redness extending beyond the area of the rash.   There is a pus like discharge.   There is increased pain.   Other signs of infection develop (such as fever).  Document Released: 04/29/2003 Document Revised: 10/12/2011 Document Reviewed: 09/08/2009 [facility] Patient Information 2012 [place], [place].  Upper Respiratory Infection, Adult An upper respiratory infection (URI) is also sometimes known as the common cold. The upper respiratory tract includes the nose, sinuses, throat, trachea, and bronchi. Bronchi are the airways leading to the lungs. Most people improve within 1 week, but symptoms can last up to 2 weeks. A residual cough may last even longer.  CAUSES Many different viruses can infect the tissues lining the upper respiratory tract. The tissues become irritated and inflamed and often become very moist. Mucus production is also common. A cold is contagious. You can easily spread the virus to others by oral contact. This includes kissing, sharing a glass, coughing, or sneezing. Touching your mouth or nose and then touching a surface, which is then touched by another person, can also spread the virus. SYMPTOMS  Symptoms typically develop 1  to 3 days after you come in contact with a cold virus. Symptoms vary from person to person. They may include:  Runny nose.   Sneezing.   Nasal congestion.   Sinus irritation.   Sore throat.   Loss of voice (laryngitis).   Cough.   Fatigue.   Muscle aches.   Loss of appetite.   Headache.   Low-grade fever.  DIAGNOSIS  You might diagnose your own cold based on familiar symptoms, since most people get a cold 2 to 3 times a year. Your caregiver can confirm this based on your exam. Most importantly, your caregiver can check that your symptoms are not due to another disease such as strep throat, sinusitis, pneumonia, asthma, or epiglottitis. Blood tests, throat tests, and X-rays are not necessary to diagnose a common cold, but they may sometimes be helpful in excluding  other more serious diseases. Your caregiver will decide if any further tests are required. RISKS AND COMPLICATIONS  You may be at risk for a more severe case of the common cold if you smoke cigarettes, have chronic heart disease (such as heart failure) or lung disease (such as asthma), or if you have a weakened immune system. The very young and very old are also at risk for more serious infections. Bacterial sinusitis, middle ear infections, and bacterial pneumonia can complicate the common cold. The common cold can worsen asthma and chronic obstructive pulmonary disease (COPD). Sometimes, these complications can require emergency medical care and may be life-threatening. PREVENTION  The best way to protect against getting a cold is to practice good hygiene. Avoid oral or hand contact with people with cold symptoms. Wash your hands often if contact occurs. There is no clear evidence that vitamin C, vitamin E, echinacea, or exercise reduces the chance of developing a cold. However, it is always recommended to get plenty of rest and practice good nutrition. TREATMENT  Treatment is directed at relieving symptoms. There is no  cure. Antibiotics are not effective, because the infection is caused by a virus, not by bacteria. Treatment may include:  Increased fluid intake. Sports drinks offer valuable electrolytes, sugars, and fluids.   Breathing heated mist or steam (vaporizer or shower).   Eating chicken soup or other clear broths, and maintaining good nutrition.   Getting plenty of rest.   Using gargles or lozenges for comfort.   Controlling fevers with ibuprofen or acetaminophen as directed by your caregiver.   Increasing usage of your inhaler if you have asthma.  Zinc gel and zinc lozenges, taken in the first 24 hours of the common cold, can shorten the duration and lessen the severity of symptoms. Pain medicines may help with fever, muscle aches, and throat pain. A variety of non-prescription medicines are available to treat congestion and runny nose. Your caregiver can make recommendations and may suggest nasal or lung inhalers for other symptoms.  HOME CARE INSTRUCTIONS   Only take over-the-counter or prescription medicines for pain, discomfort, or fever as directed by your caregiver.   Use a warm mist humidifier or inhale steam from a shower to increase air moisture. This may keep secretions moist and make it easier to breathe.   Drink enough water and fluids to keep your urine clear or pale yellow.   Rest as needed.   Return to work when your temperature has returned to normal or as your caregiver advises. You may need to stay home longer to avoid infecting others. You can also use a face mask and careful hand washing to prevent spread of the virus.  SEEK MEDICAL CARE IF:   After the first few days, you feel you are getting worse rather than better.   You need your caregiver's advice about medicines to control symptoms.   You develop chills, worsening shortness of breath, or brown or red sputum. These may be signs of pneumonia.   You develop yellow or brown nasal discharge or pain in the face,  especially when you bend forward. These may be signs of sinusitis.   You develop a fever, swollen neck glands, pain with swallowing, or white areas in the back of your throat. These may be signs of strep throat.  SEEK IMMEDIATE MEDICAL CARE IF:   You have a fever.   You develop severe or persistent headache, ear pain, sinus pain, or chest pain.   You develop wheezing, a  prolonged cough, cough up blood, or have a change in your usual mucus (if you have chronic lung disease).   You develop sore muscles or a stiff neck.  Document Released: 04/18/2001 Document Revised: 10/12/2011 Document Reviewed: 02/24/2011 [facility] Patient Information 2012 [place], [place].

## 2012-04-15 NOTE — ED Provider Notes (Signed)
History     CSN: [id number]  Arrival date & time 04/15/12  1020   First MD Initiated Contact with Patient 04/15/12 1024      10:42 AM HPI Patient reports about one week ago was in the yard. States she has known poison ivy/oak in the back yard. Reports several days later began to develop vesicular itchy lesion on her lower extremities. Reports that it is worsening and is becoming more itchy. Mild relief with antiitch lotion and benadryl.  Patient is a 41 y.o. female presenting with rash and cough. The history is provided by the patient.  Rash  This is a new problem. The current episode started 2 days ago. The problem has been gradually worsening. The problem is associated with plant contact. There has been no fever. The rash is present on the right lower leg and left lower leg. The patient is experiencing no pain. Associated symptoms include blisters, itching and weeping. She has tried anti-itch cream for the symptoms. The treatment provided mild relief.  Cough This is a new problem. The current episode started more than 2 days ago. The problem occurs every few minutes. The problem has not changed since onset.The cough is productive of sputum. There has been no fever. Associated symptoms include rhinorrhea and sore throat. Pertinent negatives include no chest pain, no chills, no ear pain, no myalgias, no shortness of breath and no wheezing. She has tried decongestants for the symptoms. The treatment provided no relief. She is a smoker.    Past Medical History  Diagnosis Date  . Anxiety   . Depression   . GERD (gastroesophageal reflux disease)   . Esophageal stricture   . Hyperlipemia   . Hypertension   . History of alcohol abuse   . Migraine headache   . History of cocaine abuse   . Bipolar disorder   . Pancreatitis, acute     Past Surgical History  Procedure Date  . Appendectomy   . Cholecystectomy   . Abdominal hysterectomy   . Cesarean section   . Knee surgery   . Nasal  septum surgery     Family History  Problem Relation Age of Onset  . Colon cancer Neg Hx   . Heart disease Maternal Grandmother   . Kidney cancer Mother   . Hypertension Mother   . Cancer Mother   . Lung cancer Maternal Aunt     History  Substance Use Topics  . Smoking status: Current Some Day Smoker -- 0.3 packs/day    Types: Cigarettes  . Smokeless tobacco: Never Used  . Alcohol Use: No    OB History    Grav Para Term Preterm Abortions TAB SAB Ect Mult Living                  Review of Systems  Constitutional: Negative for fever and chills.  HENT: Positive for sore throat and rhinorrhea. Negative for ear pain.   Respiratory: Positive for cough. Negative for shortness of breath and wheezing.   Cardiovascular: Negative for chest pain.  Gastrointestinal: Negative for nausea, vomiting and abdominal pain.  Musculoskeletal: Negative for myalgias.  Skin: Positive for itching and rash.  All other systems reviewed and are negative.    Allergies  Imitrex; Metoclopramide hcl; Morphine and related; Tegretol; and Topamax  Home Medications   Current Outpatient Rx  Name Route Sig Dispense Refill  . ALPRAZOLAM 1 MG PO TABS Oral Take 1 mg by mouth 3 (three) times daily as needed. For anxiety    .  AMLODIPINE BESYLATE 10 MG PO TABS Oral Take 10 mg by mouth daily.     . AMPHETAMINE-DEXTROAMPHETAMINE 15 MG PO TABS Oral Take 1 tablet (15 mg total) by mouth 3 (three) times daily. May fill on/after 12/15/2011 90 tablet 0  . AMPHETAMINE-DEXTROAMPHETAMINE 15 MG PO TABS Oral Take 1 tablet (15 mg total) by mouth 3 (three) times daily. 90 tablet 0  . AMPHETAMINE-DEXTROAMPHETAMINE 15 MG PO TABS Oral Take 1 tablet (15 mg total) by mouth 3 (three) times daily. May fill on/after 04/10/2012. 90 tablet 0  . AMPHETAMINE-DEXTROAMPHETAMINE 15 MG PO TABS Oral Take 1 tablet (15 mg total) by mouth 3 (three) times daily. May fill on/after 05/11/2012. 90 tablet 0  . BUPRENORPHINE HCL-NALOXONE HCL 8-2 MG SL  SUBL Sublingual Place 1 tablet under the tongue 1 day or 1 dose.    Marland Kitchen CLONIDINE HCL 0.1 MG PO TABS Oral Take 0.1 mg by mouth at bedtime.    Marland Kitchen LINACLOTIDE 290 MCG PO CAPS Oral Take 290 mcg by mouth daily. 30 capsule 11  . PANTOPRAZOLE SODIUM 40 MG PO TBEC  TAKE 1 TABLET BY MOUTH ONCE DAILY 90 tablet 0  . SERTRALINE HCL 100 MG PO TABS  TAKE 1 TABLET BY MOUTH ONCE DAILY 30 tablet 5    BP 127/89  Pulse 80  Temp(Src) 98.2 F (36.8 C) (Oral)  SpO2 100%  Physical Exam  Vitals reviewed. Constitutional: She is oriented to person, place, and time. Vital signs are normal. She appears well-developed and well-nourished.  HENT:  Head: Normocephalic and atraumatic.  Mouth/Throat: Oropharynx is clear and moist. No oropharyngeal exudate.  Eyes: Conjunctivae are normal. Pupils are equal, round, and reactive to light.  Neck: Normal range of motion. Neck supple.  Cardiovascular: Normal rate, regular rhythm and normal heart sounds.  Exam reveals no friction rub.   No murmur heard. Pulmonary/Chest: Effort normal and breath sounds normal. She has no wheezes. She has no rhonchi. She has no rales. She exhibits no tenderness.  Musculoskeletal: Normal range of motion.  Neurological: She is alert and oriented to person, place, and time. Coordination normal.  Skin: Skin is warm and dry. Rash (small vesicular lesions on bilateral lower extremities. ) noted. No erythema. No pallor.    ED Course  Procedures   MDM    Will treat for contact dermatitis with benadryl and prednisone. Pt also likely has a URI with a positive sick contact. Discussed prednisone would help with this. Voice understanding and are ready for d/c    Thomasene Lot, PA-C 04/15/12 1049

## 2012-05-02 ENCOUNTER — Ambulatory Visit (INDEPENDENT_AMBULATORY_CARE_PROVIDER_SITE_OTHER): Payer: 59 | Admitting: Physician Assistant

## 2012-05-02 ENCOUNTER — Telehealth: Payer: Self-pay

## 2012-05-02 VITALS — BP 111/76 | HR 89 | Temp 98.3°F | Resp 16 | Ht 65.5 in | Wt 194.0 lb

## 2012-05-02 DIAGNOSIS — R61 Generalized hyperhidrosis: Secondary | ICD-10-CM

## 2012-05-02 DIAGNOSIS — K120 Recurrent oral aphthae: Secondary | ICD-10-CM

## 2012-05-02 DIAGNOSIS — I10 Essential (primary) hypertension: Secondary | ICD-10-CM

## 2012-05-02 DIAGNOSIS — R635 Abnormal weight gain: Secondary | ICD-10-CM

## 2012-05-02 DIAGNOSIS — L309 Dermatitis, unspecified: Secondary | ICD-10-CM

## 2012-05-02 DIAGNOSIS — L259 Unspecified contact dermatitis, unspecified cause: Secondary | ICD-10-CM

## 2012-05-02 DIAGNOSIS — M25539 Pain in unspecified wrist: Secondary | ICD-10-CM

## 2012-05-02 DIAGNOSIS — G4700 Insomnia, unspecified: Secondary | ICD-10-CM

## 2012-05-02 DIAGNOSIS — R609 Edema, unspecified: Secondary | ICD-10-CM

## 2012-05-02 LAB — POCT CBC
Lymph, poc: 3 (ref 0.6–3.4)
MCH, POC: 28 pg (ref 27–31.2)
MCHC: 31.9 g/dL (ref 31.8–35.4)
MID (cbc): 0.5 (ref 0–0.9)
MPV: 7.6 fL (ref 0–99.8)
POC MID %: 6.7 %M (ref 0–12)
Platelet Count, POC: 334 10*3/uL (ref 142–424)
RBC: 4.47 M/uL (ref 4.04–5.48)
WBC: 6.9 10*3/uL (ref 4.6–10.2)

## 2012-05-02 LAB — COMPREHENSIVE METABOLIC PANEL
ALT: 25 U/L (ref 0–35)
AST: 23 U/L (ref 0–37)
CO2: 25 mEq/L (ref 19–32)
Calcium: 9.4 mg/dL (ref 8.4–10.5)
Chloride: 105 mEq/L (ref 96–112)
Sodium: 138 mEq/L (ref 135–145)
Total Protein: 6.8 g/dL (ref 6.0–8.3)

## 2012-05-02 LAB — TSH: TSH: 2.099 u[IU]/mL (ref 0.350–4.500)

## 2012-05-02 MED ORDER — AMLODIPINE BESYLATE 10 MG PO TABS: 10.0000 mg | ORAL_TABLET | Freq: Every day | ORAL | Status: DC

## 2012-05-02 MED ORDER — TRIAMTERENE-HCTZ 75-50 MG PO TABS: 1.0000 | ORAL_TABLET | Freq: Every day | ORAL | Status: DC

## 2012-05-02 NOTE — Patient Instructions (Addendum)
Monitor your BP regularly, especially if you feel dizzy or lightheaded.  If the systolic pressure is below 100, please hold the HCTZ.  If it's happening regularly, then reduce the Norvasc (amlodipine) to 5 mg (you can break it in half).  The labs performed here were normal.  I'll let you know when the remaining results are available.  If we don't find anything, I'll plan to refer you to endocrinology and nutrition counseling.  If your wrist pain persists with the splints, we'll refer you to a hand specialist.

## 2012-05-02 NOTE — Progress Notes (Signed)
Subjective:    Patient ID: [name], female    DOB: [birth year], 41 y.o.   MRN: [id number]  HPI  Experiencing worsening carpal tunnel symptoms, difficulty losing weight despite healthier eating and regular exercise, insomnia, sweating, sometimes profusely, swelling in the ankles, rash, tongue sore, and needs a refill of Norvasc.  Believes her thyroid is underactive.  Reports that her normal weight is 120's, and that she is heavier than she was with either of her pregnancies. She has gained 10 pounds in the last 6 months, 20 over the past years, but has also weighed as much as 190 since 2011.  Relationships and work are good.  Doesn't feel stressed.  Not sleeping well, isn't sure why.  Sore spot on the right edge of her tongue, after eating spicy food earlier this week.  She has a wrist splint for the right, and it helps.  Would like one for the left.  Rash on both legs is resolving, though she also developed rash on both forearms.  She was initially seen in the ED and diagnosed with exposure to Rhus species plants.  Calamine lotion is helping.  Review of Systems As above. No chest pain, SOB, HA, dizziness, vision change, N/V, diarrhea, constipation, dysuria, urinary urgency or frequency.     Objective:   Physical Exam  Vital signs noted. Well-developed, well nourished WF who is awake, alert and oriented, in NAD. HEENT: [place]/AT, sclera and conjunctiva are clear.   Neck: supple, non-tender, no lymphadenopathy, thyromegaly. Heart: RRR, no murmur Lungs: CTA Extremities: no cyanosis, clubbing, 1+ edema of both ankles. Skin: warm and dry. Resolving dermatitis on the lower legs and inner forearms bilaterally, consistent with contact dermatitis.  Results for orders placed in visit on 05/02/12  POCT CBC      Component Value Range   WBC 6.9  4.6 - 10.2 K/uL   Lymph, poc 3.0  0.6 - 3.4   POC LYMPH PERCENT 43.4  10 - 50 %L   MID (cbc) 0.5  0 - 0.9   POC MID % 6.7  0 - 12 %M   POC  Granulocyte 3.4  2 - 6.9   Granulocyte percent 49.9  37 - 80 %G   RBC 4.47  4.04 - 5.48 M/uL   Hemoglobin 12.5  12.2 - 16.2 g/dL   HCT, POC 40.9  81.1 - 47.9 %   MCV 87.7  80 - 97 fL   MCH, POC 28.0  27 - 31.2 pg   MCHC 31.9  31.8 - 35.4 g/dL   RDW, POC 91.4     Platelet Count, POC 334  142 - 424 K/uL   MPV 7.6  0 - 99.8 fL  GLUCOSE, POCT (MANUAL RESULT ENTRY)      Component Value Range   POC Glucose 95  70 - 99 mg/dl       Assessment & Plan:   1. HTN (hypertension)  amLODipine (NORVASC) 10 MG tablet, POCT CBC, Comprehensive metabolic panel  2. Insomnia  Await labs.  Want to avoid controlled substances.  3. Weight gain  TSH; if normal, refer to nutrition counselling and endocrinologist.  4. Diaphoresis  POCT glucose (manual entry)  5. Edema  Comprehensive metabolic panel, triamterene-hydrochlorothiazide (MAXZIDE) 75-50 MG per tablet; monitor BP.  If SBP<100, reduce Norvasc to 5 mg.  6. Dermatitis  Continue current treatment  7. Canker sore  Reassurred  8.  Carpal Tunnel, bilaterally    Wrist splints.  Refer to Hand if no improvement.

## 2012-05-04 NOTE — Addendum Note (Signed)
Addended by: [name] on: 05/04/2012 09:12 AM   Modules accepted: Orders

## 2012-05-06 NOTE — Telephone Encounter (Signed)
Close encounter 

## 2012-05-16 ENCOUNTER — Encounter: Payer: 59 | Attending: Physician Assistant | Admitting: *Deleted

## 2012-05-16 ENCOUNTER — Encounter: Payer: Self-pay | Admitting: *Deleted

## 2012-05-16 DIAGNOSIS — Z713 Dietary counseling and surveillance: Secondary | ICD-10-CM | POA: Insufficient documentation

## 2012-05-16 DIAGNOSIS — E669 Obesity, unspecified: Secondary | ICD-10-CM | POA: Insufficient documentation

## 2012-05-16 NOTE — Patient Instructions (Addendum)
Goals:  Eat 3 meals/day, Avoid meal skipping   Increase lean protein rich foods  Follow "Plate Method" for portion control  Limit carbohydrate1-2 servings/meal   Choose more whole grains, lean protein, low-fat dairy, and fruits/non-starchy vegetables.   Aim for 20-25 min of physical activity daily, maybe 2 times a day- look into aquatic center water aerobics; add weight training 2 days a week   Limit sugar-sweetened beverages and concentrated sweets- stop Dr. Reino Kent  At every meal aim for 1 carbohydrate, 1 protein, and 1 f/v

## 2012-05-16 NOTE — Progress Notes (Signed)
  Medical Nutrition Therapy:  Appt start time: 1630 end time:  1630.   Assessment:  Primary concerns today: weight gain.   MEDICATIONS: see list   DIETARY INTAKE:  Usual eating pattern includes 2-3 meals and 0 snacks per day.  Everyday foods include proteins, vegetables.  Avoided foods include none.    24-hr recall:  B ( AM): sometimes skips -eats 3 days: protein bar with Powerade zero currently.  Used to drink Dr. Reino Kent copiously until 1 month ago Snk ( AM): none  L ( PM): roast beef sandwich on sandwich thins with cheese, mustard.   Snk ( PM): none D ( PM): steak and risotto; usually meat and vegetable Snk ( PM): none Beverages: 2 20 oz Dr. Reino Kent, Powerade zero  Usual physical activity: workout video 15 minutes daily  Estimated energy needs: 1500 calories 170 g carbohydrates 112 g protein 42 g fat  Progress Towards Goal(s):  In progress.   Nutritional Diagnosis:  Sigurd-3.3 Overweight/obesity As related to meal skipping, sugary beverage consumption, anad inadequate physical activity.  As evidenced by BMI of 30.8.    Intervention:  Nutrition counseling provided.  Patient is here for weight loss education.  She repots gaining a lot of weight recently despite making healthier lifestyle changes.  She used to drink copious amounts of Dr Reino Kent and was not physically active.  She has increased her activity to 15 minutes daily and has cut back on her soda consumption in the last 30 days, but has not seen the results she wants. Encouraged 3 meals a day and to avoid meal skipping.  Discussed portion control and balancing macronutrients at meal times.  Discussed limiting fats and added sugars from foods and beverages.  Also encouraged limiting sodium intake.  Encouraged more physical activity and incorporating weight training.  Encouraged [facility] classes for water aerobics.  Discussed reading food labels and using Calorie[employer] app  Handouts given during visit include:  Weight loss  tips  Portion control  Chair exercises  My meal plan card  Reading food labels  Monitoring/Evaluation:  Dietary intake, exercise, and body weight in 3 week(s).

## 2012-06-04 ENCOUNTER — Observation Stay (HOSPITAL_COMMUNITY)
Admission: EM | Admit: 2012-06-04 | Discharge: 2012-06-04 | Disposition: A | Discharge status: Home / Self Care | Payer: 59 | Attending: Emergency Medicine | Admitting: Emergency Medicine

## 2012-06-04 ENCOUNTER — Encounter (HOSPITAL_COMMUNITY): Payer: Self-pay | Admitting: Emergency Medicine

## 2012-06-04 DIAGNOSIS — T783XXA Angioneurotic edema, initial encounter: Secondary | ICD-10-CM

## 2012-06-04 DIAGNOSIS — R220 Localized swelling, mass and lump, head: Principal | ICD-10-CM | POA: Insufficient documentation

## 2012-06-04 LAB — RAPID STREP SCREEN (MED CTR MEBANE ONLY): Streptococcus, Group A Screen (Direct): NEGATIVE

## 2012-06-04 MED ORDER — PREDNISONE 20 MG PO TABS: 40.0000 mg | ORAL_TABLET | Freq: Every day | ORAL | Status: DC

## 2012-06-04 MED ORDER — LORAZEPAM 2 MG/ML IJ SOLN
1.0000 mg | Freq: Once | INTRAMUSCULAR | Status: AC
  Administered 2012-06-04: 1 mg via INTRAVENOUS
  Filled 2012-06-04: qty 1

## 2012-06-04 MED ORDER — FAMOTIDINE IN NACL 20-0.9 MG/50ML-% IV SOLN
20.0000 mg | Freq: Once | INTRAVENOUS | Status: AC
  Administered 2012-06-04: 20 mg via INTRAVENOUS
  Filled 2012-06-04: qty 50

## 2012-06-04 MED ORDER — EPINEPHRINE 0.3 MG/0.3ML IJ DEVI
0.3000 mg | Freq: Once | INTRAMUSCULAR | Status: AC
  Administered 2012-06-04: 0.3 mg via INTRAMUSCULAR
  Filled 2012-06-04: qty 0.3

## 2012-06-04 MED ORDER — DIPHENHYDRAMINE HCL 25 MG PO CAPS: 25.0000 mg | ORAL_CAPSULE | Freq: Four times a day (QID) | ORAL | Status: AC | PRN

## 2012-06-04 MED ORDER — DIPHENHYDRAMINE HCL 50 MG/ML IJ SOLN
25.0000 mg | Freq: Once | INTRAMUSCULAR | Status: AC
  Administered 2012-06-04: 25 mg via INTRAVENOUS
  Filled 2012-06-04: qty 1

## 2012-06-04 MED ORDER — KETOROLAC TROMETHAMINE 30 MG/ML IJ SOLN
30.0000 mg | Freq: Once | INTRAMUSCULAR | Status: AC
  Administered 2012-06-04: 30 mg via INTRAVENOUS
  Filled 2012-06-04: qty 1

## 2012-06-04 MED ORDER — METHYLPREDNISOLONE SODIUM SUCC 125 MG IJ SOLR
125.0000 mg | Freq: Once | INTRAMUSCULAR | Status: AC
  Administered 2012-06-04: 125 mg via INTRAVENOUS
  Filled 2012-06-04: qty 2

## 2012-06-04 MED ORDER — EPINEPHRINE 0.3 MG/0.3ML IJ DEVI: 0.3000 mg | INTRAMUSCULAR | Status: DC | PRN

## 2012-06-04 NOTE — ED Notes (Signed)
Pt. Denies pain; states, "feel better"

## 2012-06-04 NOTE — ED Provider Notes (Signed)
History     CSN: [id number]  Arrival date & time 06/04/12  0615   First MD Initiated Contact with Patient 06/04/12 [phone]      Chief Complaint  Patient presents with  . Sore Throat    (Consider location/radiation/quality/duration/timing/severity/associated sxs/prior treatment) Patient is a 41 y.o. female presenting with allergic reaction. The history is provided by the patient. No language interpreter was used.  Allergic Reaction The primary symptoms are  angioedema. The primary symptoms do not include wheezing, shortness of breath, nausea, vomiting, dizziness or rash. The current episode started 3 to 5 hours ago. The problem has been gradually worsening. This is a recurrent problem.  The angioedema is not associated with shortness of breath.   Significant symptoms that are not present include itching.   41 year old female coming in with complaint of allergic reaction with tongue and throat swelling. Patient woke around 5:30 and discovered her tongue was swollen. Patient had the same reaction  6 months ago and came to the ER. No rash, fever, wheezing or complaint of itching. She does not take an ACE inhibitor. Will treat as an allergic reaction.     Past Medical History  Diagnosis Date  . Anxiety   . Depression   . GERD (gastroesophageal reflux disease)   . Esophageal stricture   . Hyperlipemia   . Hypertension   . History of alcohol abuse   . Migraine headache   . History of cocaine abuse   . Bipolar disorder   . Pancreatitis, acute   . Obesity     Past Surgical History  Procedure Date  . Appendectomy   . Cholecystectomy   . Abdominal hysterectomy   . Cesarean section   . Knee surgery   . Nasal septum surgery     Family History  Problem Relation Age of Onset  . Colon cancer Neg Hx   . Heart disease Maternal Grandmother   . Kidney cancer Mother   . Hypertension Mother   . Cancer Mother   . Lung cancer Maternal Aunt     History  Substance Use Topics  .  Smoking status: Current Some Day Smoker -- 0.3 packs/day    Types: Cigarettes  . Smokeless tobacco: Never Used  . Alcohol Use: No    OB History    Grav Para Term Preterm Abortions TAB SAB Ect Mult Living                  Review of Systems  Constitutional: Negative.  Negative for fever.  HENT: Negative.   Eyes: Negative.   Respiratory: Negative.  Negative for chest tightness, shortness of breath and wheezing.   Cardiovascular: Negative.  Negative for chest pain and leg swelling.  Gastrointestinal: Negative.  Negative for nausea and vomiting.  Skin: Negative for itching and rash.  Neurological: Negative.  Negative for dizziness, weakness and headaches.  Psychiatric/Behavioral: Negative.   All other systems reviewed and are negative.    Allergies  Imitrex; Metoclopramide hcl; Morphine and related; Tegretol; and Topamax  Home Medications   Current Outpatient Rx  Name Route Sig Dispense Refill  . ALPRAZOLAM 1 MG PO TABS Oral Take 1 mg by mouth 3 (three) times daily as needed. For anxiety    . AMLODIPINE BESYLATE 10 MG PO TABS Oral Take 1 tablet (10 mg total) by mouth daily. 90 tablet 1  . AMPHETAMINE-DEXTROAMPHETAMINE 15 MG PO TABS Oral Take 1 tablet (15 mg total) by mouth 3 (three) times daily. May fill on/after 04/10/2012. 90  tablet 0  . AMPHETAMINE-DEXTROAMPHETAMINE 15 MG PO TABS Oral Take 1 tablet (15 mg total) by mouth 3 (three) times daily. May fill on/after 05/11/2012. 90 tablet 0  . BUPRENORPHINE HCL-NALOXONE HCL 8-2 MG SL SUBL Sublingual Place 1 tablet under the tongue 1 day or 1 dose.    Marland Kitchen CLONIDINE HCL 0.1 MG PO TABS Oral Take 0.1 mg by mouth at bedtime.    Marland Kitchen PANTOPRAZOLE SODIUM 40 MG PO TBEC  TAKE 1 TABLET BY MOUTH ONCE DAILY 90 tablet 0  . SERTRALINE HCL 100 MG PO TABS Oral Take 100 mg by mouth daily.    . TRIAMTERENE-HCTZ 75-50 MG PO TABS Oral Take 1 tablet by mouth daily. 90 tablet 1    BP 123/72  Pulse 91  Temp 98.1 F (36.7 C) (Oral)  Resp 18  SpO2  100%  Physical Exam  Nursing note and vitals reviewed. Constitutional: She is oriented to person, place, and time. She appears well-developed and well-nourished.  HENT:  Head: Normocephalic and atraumatic. No trismus in the jaw.  Mouth/Throat: Uvula is midline.         Tongue and uvula swelling able to swallow and handle secretions  Eyes: Conjunctivae and EOM are normal. Pupils are equal, round, and reactive to light.  Neck: Normal range of motion. Neck supple.  Cardiovascular: Normal rate.   Pulmonary/Chest: Effort normal.  Abdominal: Soft.  Musculoskeletal: Normal range of motion. She exhibits no edema and no tenderness.  Neurological: She is alert and oriented to person, place, and time. She has normal reflexes.  Skin: Skin is warm and dry.  Psychiatric: She has a normal mood and affect.    ED Course  Procedures (including critical care time)   Labs Reviewed  RAPID STREP SCREEN   No results found.   No diagnosis found.    MDM  Allergic reaction with tongue/throat and uvula swelling to unknown substance and reacurance.  Report given to [facility] PA in CDU.  Will watch patient a little longer.  Uvula has decreased in swelling 1/4 inch since she has been here.         Remi Haggard, NP 06/05/12 1254

## 2012-06-04 NOTE — ED Provider Notes (Signed)
11:30 AM Pt seen and examined by me in CDU. Pt woke up this morning with swelling to the tongue, uvula, throat. Pt states no pain. States difficulty swallowing. Similar episode about 4 months ago with no known cause. Pt received epi pen injection. Also received solumedrol, benadryl IV. Pt now feeling better. In CDU on allergic reaction protocol. Pt denies any new products, foods. No known exposure to allergens. Exam: pt in NAD, no lip or facial swelling noted. Tongue mildly swollen, uvula edematous but midline. Lungs are clear bilaterally, no stridor or wheezing. Oxygen sat 100% on RA. Regular HR and rhythm  Filed Vitals:   06/04/12 1057  BP: 114/74  Pulse: 84  Temp: 98.8 F (37.1 C)  Resp: 16   Pt asking for some pain medications for her right thigh where she received an injection of EPI. Will continue monitoring pt.   1:14 PM Pt now monitored for 7 hrs. Pt's swelling completely resolved. She no longer has swelling of the tongue or uvula on exam. Lungs remain clear bilaterally with no stridor. Normal respirations. Regular HR and rhythm.   Pt feeling back to normal, wanting to go home. Will d/c home with steroids, benadryl, epi pen, close follow up with pcp and possibly an allergist.   Lottie Mussel, PA 06/04/12 1315

## 2012-06-04 NOTE — ED Notes (Signed)
Pt states she feels no improvement after EPI injection. Will make PA aware.

## 2012-06-04 NOTE — ED Notes (Signed)
Pt. Feels better. No evidence of swelling. Breathing easier.

## 2012-06-04 NOTE — ED Notes (Signed)
Report given to ED, RN- CDU. Pt to move to RM 5.

## 2012-06-04 NOTE — ED Notes (Signed)
Pt reports she awoke this morning with sore swollen throat states hx of same in past but was not given a dx of the illness

## 2012-06-05 NOTE — ED Provider Notes (Signed)
Medical screening examination/treatment/procedure(s) were conducted as a shared visit with non-physician practitioner(s) and myself.  I personally evaluated the patient during the encounter  [name]. Rubin Payor, MD 06/05/12 [phone]

## 2012-06-06 ENCOUNTER — Ambulatory Visit: Payer: 59 | Admitting: *Deleted

## 2012-06-06 NOTE — ED Provider Notes (Signed)
Medical screening examination/treatment/procedure(s) were conducted as a shared visit with non-physician practitioner(s) and myself.  I personally evaluated the patient during the encounter  The patient has evidence of swelling of her posterior pharynx.  She is tolerating secretions and oral airway is patent at this time.  The patient be treated as a standard allergic reaction will be monitored closely in the emergency department.  If anything changes or worsens the patient will record her epinephrine and more aggressive management including possible airway involvement of this time I do not believe she did the patient.  Her care was transferred to Dr. Rubin Payor at 7:30 AM  Lyanne Co, MD 06/06/12 [phone]

## 2012-06-11 ENCOUNTER — Telehealth: Payer: Self-pay

## 2012-06-11 MED ORDER — AMPHETAMINE-DEXTROAMPHETAMINE 15 MG PO TABS: 15.0000 mg | ORAL_TABLET | Freq: Three times a day (TID) | ORAL | Status: DC

## 2012-06-11 NOTE — Telephone Encounter (Signed)
Last seen for ADD on 03/11/12. Last RF "fill on/after 05/11/12".

## 2012-06-11 NOTE — Telephone Encounter (Signed)
Called patient and she is advised ready for pick up

## 2012-06-11 NOTE — Telephone Encounter (Signed)
Done and printed

## 2012-06-11 NOTE — Telephone Encounter (Signed)
Pt requesting rx refill on adderrall please call when ready for pick-up [phone]

## 2012-06-18 ENCOUNTER — Ambulatory Visit (INDEPENDENT_AMBULATORY_CARE_PROVIDER_SITE_OTHER): Payer: 59 | Admitting: Physician Assistant

## 2012-06-18 VITALS — BP 134/82 | HR 87 | Temp 98.2°F | Resp 16 | Ht 66.0 in | Wt 190.0 lb

## 2012-06-18 DIAGNOSIS — J029 Acute pharyngitis, unspecified: Secondary | ICD-10-CM

## 2012-06-18 DIAGNOSIS — L301 Dyshidrosis [pompholyx]: Secondary | ICD-10-CM

## 2012-06-18 DIAGNOSIS — R5383 Other fatigue: Secondary | ICD-10-CM

## 2012-06-18 DIAGNOSIS — R5381 Other malaise: Secondary | ICD-10-CM

## 2012-06-18 DIAGNOSIS — Z87898 Personal history of other specified conditions: Secondary | ICD-10-CM

## 2012-06-18 DIAGNOSIS — K1379 Other lesions of oral mucosa: Secondary | ICD-10-CM

## 2012-06-18 LAB — POCT CBC
Granulocyte percent: 58.6 %G (ref 37–80)
Hemoglobin: 14.2 g/dL (ref 12.2–16.2)
MID (cbc): 0.6 (ref 0–0.9)
MPV: 8.3 fL (ref 0–99.8)
POC MID %: 6.3 %M (ref 0–12)
Platelet Count, POC: 330 10*3/uL (ref 142–424)
RBC: 5.09 M/uL (ref 4.04–5.48)

## 2012-06-18 MED ORDER — TRIAMCINOLONE ACETONIDE 0.1 % MT PSTE: PASTE | OROMUCOSAL | Status: DC

## 2012-06-18 MED ORDER — MAGIC MOUTHWASH W/LIDOCAINE: ORAL | Status: DC

## 2012-06-18 MED ORDER — BETAMETHASONE DIPROPIONATE AUG 0.05 % EX OINT: TOPICAL_OINTMENT | Freq: Two times a day (BID) | CUTANEOUS | Status: DC

## 2012-06-19 ENCOUNTER — Encounter: Payer: Self-pay | Admitting: Physician Assistant

## 2012-06-19 NOTE — Progress Notes (Signed)
Subjective:    Patient ID: [name], female    DOB: 04/27/1971, 41 y.o.   MRN: [id number]  HPI Pt presents to clinic with several concerns - 1- pt has has 2 unexplained episodes of uvular swelling and ED visit over the last 4 months.  They occur at 5am and she almost had to be intubated last time.  She now has an epipen but they do not know what caused these episodes. - she needs a referral to an allergist 2- blisters on hands - for a while - intermittent worse when she is stressed - they itch and burn then she pops them and then they go away.  They are always on the thenar eminences. 3- over the last 3 days she has developed sores on her tongue (the left side) that really hurt and fatigue.  She has had the sores before but never the fatigue.  She has no really cold symptoms except for some mild PND.  She has had no exposures that she knows of.   Review of Systems  Constitutional: Negative for fever and chills.  HENT: Positive for mouth sores and postnasal drip. Negative for congestion, sore throat, rhinorrhea and sinus pressure.   Skin: Positive for rash.       Objective:   Physical Exam  Vitals reviewed. Constitutional: She is oriented to person, place, and time. She appears well-developed and well-nourished.  HENT:  Head: Normocephalic and atraumatic.  Right Ear: External ear normal.  Left Ear: External ear normal.  Nose: Nose normal.  Mouth/Throat: Oropharynx is clear and moist. No oropharyngeal exudate.    Eyes: Conjunctivae are normal.  Neck: Neck supple.  Cardiovascular: Normal rate, regular rhythm and normal heart sounds.   No murmur heard. Pulmonary/Chest: Effort normal and breath sounds normal.  Lymphadenopathy:    She has no cervical adenopathy.  Neurological: She is alert and oriented to person, place, and time.  Skin: Skin is warm and dry.     Psychiatric: She has a normal mood and affect. Her behavior is normal. Thought content normal.   Results for orders  placed in visit on 06/18/12  POCT CBC      Component Value Range   WBC 9.0  4.6 - 10.2 K/uL   Lymph, poc 3.2  0.6 - 3.4   POC LYMPH PERCENT 35.1  10 - 50 %L   MID (cbc) 0.6  0 - 0.9   POC MID % 6.3  0 - 12 %M   POC Granulocyte 5.3  2 - 6.9   Granulocyte percent 58.6  37 - 80 %G   RBC 5.09  4.04 - 5.48 M/uL   Hemoglobin 14.2  12.2 - 16.2 g/dL   HCT, POC 40.9  81.1 - 47.9 %   MCV 90.6  80 - 97 fL   MCH, POC 27.9  27 - 31.2 pg   MCHC 30.8 (*) 31.8 - 35.4 g/dL   RDW, POC 91.4     Platelet Count, POC 330  142 - 424 K/uL   MPV 8.3  0 - 99.8 fL          Assessment & Plan:   1. Fatigue  POCT CBC  2. Sore throat  POCT CBC  3. H/O angioedema  Ambulatory referral to Allergy  4. Eczema, dyshidrotic  augmented betamethasone dipropionate (DIPROLENE) 0.05 % ointment  5. Mouth sores  Alum & Mag Hydroxide-Simeth (MAGIC MOUTHWASH W/LIDOCAINE) SOLN, triamcinolone (KENALOG) 0.1 % paste   1,2,5 - probably viral in  origin - pt to watch and wait - she has had a Nl TSH recently and because this was acute onset we would expect it to resolve. 3- pt to keep epipen around in case this happens before she can see an allergist.

## 2012-06-21 ENCOUNTER — Other Ambulatory Visit: Payer: Self-pay | Admitting: Physician Assistant

## 2012-06-24 ENCOUNTER — Telehealth: Payer: Self-pay

## 2012-06-24 NOTE — Telephone Encounter (Signed)
I called patient she has had surgery for this by Dr Ezzard Standing and is still having sleep apnea symptoms. She is requesting CPAP. Her last sleep study was a couple years ago in [employer]. How do we proceed with this for her? Do we get old study or does she need a new one? Please advise and I will notify patient.

## 2012-06-24 NOTE — Telephone Encounter (Signed)
Does not want [facility], wants another company, will send order, she is faxing the sleep study to me.

## 2012-06-24 NOTE — Telephone Encounter (Signed)
We can fax the sleep study to [facility] with an order and see if they are able to provide a CPAP. Or we could get another sleep study with Dr. Vickey Huger.

## 2012-06-24 NOTE — Telephone Encounter (Signed)
[name],   Pt requests your call back to discuss sleep apnea machine, previously left message for Benny Lennert.  Please call [phone]

## 2012-06-27 NOTE — Telephone Encounter (Signed)
I have still not gotten the sleep study when I get this, will send for her.

## 2012-06-28 ENCOUNTER — Encounter: Payer: Self-pay | Admitting: Physician Assistant

## 2012-06-28 DIAGNOSIS — G4733 Obstructive sleep apnea (adult) (pediatric): Secondary | ICD-10-CM

## 2012-07-05 ENCOUNTER — Other Ambulatory Visit: Payer: Self-pay | Admitting: Physician Assistant

## 2012-07-05 ENCOUNTER — Ambulatory Visit (INDEPENDENT_AMBULATORY_CARE_PROVIDER_SITE_OTHER): Payer: 59 | Admitting: Physician Assistant

## 2012-07-05 VITALS — BP 132/82 | HR 74 | Temp 98.0°F | Resp 17 | Ht 66.0 in | Wt 192.0 lb

## 2012-07-05 DIAGNOSIS — M25531 Pain in right wrist: Secondary | ICD-10-CM

## 2012-07-05 DIAGNOSIS — G5600 Carpal tunnel syndrome, unspecified upper limb: Secondary | ICD-10-CM

## 2012-07-05 DIAGNOSIS — G5603 Carpal tunnel syndrome, bilateral upper limbs: Secondary | ICD-10-CM | POA: Insufficient documentation

## 2012-07-05 DIAGNOSIS — M25539 Pain in unspecified wrist: Secondary | ICD-10-CM

## 2012-07-05 DIAGNOSIS — R110 Nausea: Secondary | ICD-10-CM

## 2012-07-05 MED ORDER — KETOROLAC TROMETHAMINE 60 MG/2ML IM SOLN
60.0000 mg | Freq: Once | INTRAMUSCULAR | Status: AC
  Administered 2012-07-05: 60 mg via INTRAMUSCULAR

## 2012-07-05 MED ORDER — ONDANSETRON 8 MG PO TBDP: 8.0000 mg | ORAL_TABLET | Freq: Three times a day (TID) | ORAL | Status: DC | PRN

## 2012-07-05 NOTE — Progress Notes (Signed)
Subjective:    Patient ID: [name], female    DOB: [birth year], 41 y.o.   MRN: [id number]  HPI This 41 y.o. female presents for evaluation of right wrist pain.  Flare of CTS began after mowing the grass yesterday, which requires lugging the mower up and down a large hill. She tried to get in with Arlys [name], the PA she sees at Dr. Carlos Levering office, but he didn't have an opening today.  The plan is to proceed with NCS and then surgical intervention.  She has a brace and has meloxicam at home.  Had a prednisone taper less than 30 days ago..  Review of Systems As above.   Past Medical History  Diagnosis Date  . Anxiety   . Depression   . GERD (gastroesophageal reflux disease)   . Esophageal stricture   . Hyperlipemia   . Hypertension   . History of alcohol abuse   . Migraine headache   . History of cocaine abuse   . Bipolar disorder   . Pancreatitis, acute   . Obesity     Past Surgical History  Procedure Date  . Appendectomy   . Cholecystectomy   . Abdominal hysterectomy   . Cesarean section   . Knee surgery   . Nasal septum surgery     Prior to Admission medications   Medication Sig Start Date End Date Taking? Authorizing Provider  ALPRAZolam Prudy Feeler) 1 MG tablet Take 1 mg by mouth 3 (three) times daily as needed. For anxiety   Yes Historical Provider, MD  Alum & Mag Hydroxide-Simeth (MAGIC MOUTHWASH W/LIDOCAINE) SOLN 1 tsp po q1-2h prn sore throat 06/18/12  Yes Morrell Riddle, PA-C  amLODipine (NORVASC) 10 MG tablet Take 1 tablet (10 mg total) by mouth daily. 05/02/12  Yes Shenica Holzheimer S Kendrah Lovern, PA-C  amphetamine-dextroamphetamine (ADDERALL) 15 MG tablet Take 1 tablet (15 mg total) by mouth 3 (three) times daily. 06/11/12  Yes Ryan M Dunn, PA-C  augmented betamethasone dipropionate (DIPROLENE) 0.05 % ointment Apply topically 2 (two) times daily. To hands 06/18/12 06/18/13 Yes Sarah Harvie Bridge, PA-C  buprenorphine-naloxone (SUBOXONE) 8-2 MG SUBL Place 1 tablet under the tongue 1 day or 1 dose.    Yes Historical Provider, MD  cloNIDine (CATAPRES) 0.1 MG tablet Take 0.1 mg by mouth at bedtime.   Yes Historical Provider, MD  EPINEPHrine (EPIPEN) 0.3 mg/0.3 mL DEVI Inject 0.3 mLs (0.3 mg total) into the muscle as needed (inject at time of signs of anaphylaxis). 06/04/12  Yes Tatyana A Kirichenko, PA  pantoprazole (PROTONIX) 40 MG tablet Take 40 mg by mouth daily.   Yes Historical Provider, MD  pantoprazole (PROTONIX) 40 MG tablet TAKE 1 TABLET BY MOUTH ONCE DAILY 06/21/12  Yes Ryan M Dunn, PA-C  sertraline (ZOLOFT) 100 MG tablet Take 100 mg by mouth daily.   Yes Historical Provider, MD  triamcinolone (KENALOG) 0.1 % paste Place onto tongue sores qd prn 06/18/12   Morrell Riddle, PA-C    Allergies  Allergen Reactions  . Imitrex (Sumatriptan Base) Shortness Of Breath  . Metoclopramide Hcl Itching  . Morphine And Related Itching  . Tegretol (Carbamazepine) Itching  . Topamax Itching    History   Social History  . Marital Status: Significant Other    Spouse Name: Dustin Flock    Number of Children: 2  . Years of Education: N/A   Occupational History  . CNA    Social History Main Topics  . Smoking status: Current Some Day Smoker --  1.0 packs/day for 20 years    Types: Cigarettes  . Smokeless tobacco: Never Used  . Alcohol Use: No  . Drug Use: No  . Sexually Active: Yes -- Female partner(s)   Other Topics Concern  . Not on file   Social History Narrative   3 caffeine drinks daily Student in substance abuse counseling program.    Family History  Problem Relation Age of Onset  . Colon cancer Neg Hx   . Heart disease Maternal Grandmother   . Kidney cancer Mother   . Hypertension Mother   . Cancer Mother   . Lung cancer Maternal Aunt        Objective:   Physical Exam  Blood pressure 132/82, pulse 74, temperature 98 F (36.7 C), temperature source Oral, resp. rate 17, height 5\' 6"  (1.676 m), weight 192 lb (87.091 kg), SpO2 98.00%. Body mass index is 30.99  kg/(m^2). Well-developed, well nourished WF who is awake, alert and oriented, in NAD. HEENT: [place]/AT, sclera and conjunctiva are clear.   Extremities: no cyanosis, clubbing. Small amount of swelling over the dorso-medial aspect of the right wrist.  Tender.  Pain with flexion of the wrist and grasping. Skin: warm and dry without rash.     Assessment & Plan:   1. Carpal tunnel syndrome, bilateral    2. Wrist pain, right  ketorolac (TORADOL) injection 60 mg  3. Nausea  ondansetron (ZOFRAN-ODT) 8 MG disintegrating tablet   Resume Meloxicam, and wear brace.  Schedule with Hand.  Rest wrist.   Refilled Zofran for prn nausea associated with migraine HA.

## 2012-07-11 ENCOUNTER — Telehealth: Payer: Self-pay

## 2012-07-11 DIAGNOSIS — G4733 Obstructive sleep apnea (adult) (pediatric): Secondary | ICD-10-CM

## 2012-07-11 NOTE — Telephone Encounter (Signed)
Pt needs refill prescription for Adderall. Please call pt when ready to pick up [phone]

## 2012-07-12 ENCOUNTER — Telehealth: Payer: Self-pay

## 2012-07-12 MED ORDER — AMPHETAMINE-DEXTROAMPHETAMINE 15 MG PO TABS: 15.0000 mg | ORAL_TABLET | Freq: Three times a day (TID) | ORAL | Status: DC

## 2012-07-12 NOTE — Telephone Encounter (Signed)
Pt calling in regards to her sleep apnea machine, she states that she was suppose to receive a sleep apnea machine but has not heard from anyone as of yet. Also pt states that would like to use anywhere but [facility]. [facility] [phone]

## 2012-07-12 NOTE — Telephone Encounter (Signed)
Called pt, advised RX ready to pick up. Pt wants [name] to review message because she states [name] is ordering this and has talked in depth about it. Please advise

## 2012-07-12 NOTE — Telephone Encounter (Signed)
Adderall rx ready for pickup. Usually whoever does the sleep study is who takes care of Cpap machine. Has she tried [facility]?

## 2012-07-13 NOTE — Telephone Encounter (Signed)
Please call patient. I've put in a referral for her CPAP, NOT at [facility]. Ask her to call us back if she hasn't heard anything by the end of next week.

## 2012-07-15 NOTE — Telephone Encounter (Signed)
Patient notified

## 2012-07-16 ENCOUNTER — Ambulatory Visit (INDEPENDENT_AMBULATORY_CARE_PROVIDER_SITE_OTHER): Payer: 59 | Admitting: Family Medicine

## 2012-07-16 ENCOUNTER — Telehealth: Payer: Self-pay

## 2012-07-16 ENCOUNTER — Encounter: Payer: Self-pay | Admitting: Family Medicine

## 2012-07-16 VITALS — BP 116/82 | HR 78 | Temp 98.4°F | Resp 16 | Ht 65.5 in | Wt 191.2 lb

## 2012-07-16 DIAGNOSIS — Z111 Encounter for screening for respiratory tuberculosis: Secondary | ICD-10-CM

## 2012-07-16 NOTE — Patient Instructions (Signed)
Tuberculin Skin Test The PPD skin test is a method used to help with the diagnosis of a disease called tuberculosis (TB). HOW THE TEST IS DONE  The test site (usually the forearm) is cleansed. The PPD extract is then injected under the top layer of skin, causing a blister to form on the skin. The reaction will take 48 - 72 hours to develop. You must return to your health care provider within that time to have the area checked. This will determine whether you have had a significant reaction to the PPD test. A reaction is measured in millimeters of hard swelling (induration) at the site. PREPARATION FOR TEST  There is no special preparation for this test. People with a skin rash or other skin irritations on their arms may need to have the test performed at a different spot on the body. Tell your health care provider if you have ever had a positive PPD skin test. If so, you should not have a repeat PPD test. Tell your doctor if you have a medical condition or if you take certain drugs, such as steroids, that can affect your immune system. These situations may lead to inaccurate test results. NORMAL FINDINGS A negative reaction (no induration) or a level of hard swelling that falls below a certain cutoff may mean that a person has not been infected with the bacteria that cause TB. There are different cutoffs for children, people with HIV, and other risk groups. Unfortunately, this is not a perfect test, and up to 20% of people infected with tuberculosis may not have a reaction on the PPD skin test. In addition, certain conditions that affect the immune system (cancer, recent chemotherapy, late-stage AIDS) may cause a false-negative test result.  The reaction will take 48 - 72 hours to develop. You must return to your health care provider within that time to have the area checked. Follow your caregiver's instructions as to where and when to report for this to be done.  Ranges for normal findings may vary  among different laboratories and hospitals. You should always check with your doctor after having lab work or other tests done to discuss the meaning of your test results and whether your values are considered within normal limits. WHAT ABNORMAL RESULTS MEAN  The results of the test depend on the size of the skin reaction and on the person being tested.  A small reaction (5 mm of hard swelling at the site) is considered to be positive in people who have HIV, who are taking steroid therapy, or who have been in close contact with a person who has active tuberculosis. Larger reactions (greater than or equal to 10 mm) are considered positive in people with diabetes or kidney failure, and in health care workers, among others. In people with no known risks for tuberculosis, a positive reaction requires 15 mm or more of hard swelling at the site. RISKS AND COMPLICATIONS There is a very small risk of severe redness and swelling of the arm in people who have had a previous positive PPD test and who have the test again. There also have been a few rare cases of this reaction in people who have not been tested before. CONSIDERATIONS  A positive skin test does not necessarily mean that a person has active tuberculosis. More tests will be done to check whether active disease is present. Many people who were born outside the [place] may have had a vaccine called "BCG," which can lead to a false-positive  test result. MEANING OF TEST  Your caregiver will go over the test results with you and discuss the importance and meaning of your results, as well as treatment options and the need for additional tests if necessary. OBTAINING THE TEST RESULTS It is your responsibility to obtain your test results. Ask the lab or department performing the test when and how you will get your results. Document Released: 08/02/2005 Document Revised: 10/12/2011 Document Reviewed: 10/04/2008 [facility] Patient Information 2012  [place], [place].

## 2012-07-16 NOTE — Progress Notes (Signed)
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 She has never had + PPD and denies any symptoms consistent with TB. She has not travelled to any areas  recently where she would have been exposed to TB.  O:  Filed Vitals:   07/16/12 1256  BP: 116/82  Pulse: 78  Temp: 98.4 F (36.9 C)  Resp: 16   GEN: In NAD; WN,WD. COR: RRR; BP well controlled LUNGS: Normal resp rate and effort NEURO: A&O x 3; CNs intact; otherwise unremarkable.  A/P:  1. Screening for tuberculosis  TB Skin Test  RTC in 48-72 hours for reading.

## 2012-07-16 NOTE — Telephone Encounter (Signed)
[name] from [facility] needs sleep study and titration report with pressure settings sent to them. States they received an order from [facility] yesterday but need the sleep study report. Phone # [phone] and fax # [phone].

## 2012-07-19 ENCOUNTER — Encounter: Payer: Self-pay | Admitting: Physician Assistant

## 2012-07-19 DIAGNOSIS — G5603 Carpal tunnel syndrome, bilateral upper limbs: Secondary | ICD-10-CM

## 2012-07-19 LAB — TB SKIN TEST

## 2012-07-19 NOTE — Telephone Encounter (Signed)
Called [facility] back regarding we do not have a copy of the sleep study or titration report.  I let them know that we referred patient to [facility] to get the cpap machine maybe she should try them.

## 2012-07-25 ENCOUNTER — Emergency Department (HOSPITAL_COMMUNITY)
Admission: EM | Admit: 2012-07-25 | Discharge: 2012-07-25 | Disposition: A | Discharge status: Home / Self Care | Payer: 59 | Attending: Emergency Medicine | Admitting: Emergency Medicine

## 2012-07-25 ENCOUNTER — Encounter (HOSPITAL_BASED_OUTPATIENT_CLINIC_OR_DEPARTMENT_OTHER): Payer: Self-pay | Admitting: *Deleted

## 2012-07-25 DIAGNOSIS — Z886 Allergy status to analgesic agent status: Secondary | ICD-10-CM | POA: Insufficient documentation

## 2012-07-25 DIAGNOSIS — F319 Bipolar disorder, unspecified: Secondary | ICD-10-CM | POA: Insufficient documentation

## 2012-07-25 DIAGNOSIS — F17200 Nicotine dependence, unspecified, uncomplicated: Secondary | ICD-10-CM | POA: Insufficient documentation

## 2012-07-25 DIAGNOSIS — F1193 Opioid use, unspecified with withdrawal: Secondary | ICD-10-CM

## 2012-07-25 DIAGNOSIS — E785 Hyperlipidemia, unspecified: Secondary | ICD-10-CM | POA: Insufficient documentation

## 2012-07-25 DIAGNOSIS — F1120 Opioid dependence, uncomplicated: Secondary | ICD-10-CM | POA: Insufficient documentation

## 2012-07-25 DIAGNOSIS — F19939 Other psychoactive substance use, unspecified with withdrawal, unspecified: Secondary | ICD-10-CM | POA: Insufficient documentation

## 2012-07-25 DIAGNOSIS — I10 Essential (primary) hypertension: Secondary | ICD-10-CM | POA: Insufficient documentation

## 2012-07-25 DIAGNOSIS — F411 Generalized anxiety disorder: Secondary | ICD-10-CM | POA: Insufficient documentation

## 2012-07-25 DIAGNOSIS — F1123 Opioid dependence with withdrawal: Secondary | ICD-10-CM

## 2012-07-25 DIAGNOSIS — K219 Gastro-esophageal reflux disease without esophagitis: Secondary | ICD-10-CM | POA: Insufficient documentation

## 2012-07-25 MED ORDER — SODIUM CHLORIDE 0.9 % IV SOLN
INTRAVENOUS | Status: DC
  Administered 2012-07-25: 23:00:00 via INTRAVENOUS

## 2012-07-25 MED ORDER — ONDANSETRON HCL 4 MG/2ML IJ SOLN
4.0000 mg | INTRAMUSCULAR | Status: AC
  Administered 2012-07-25: 4 mg via INTRAVENOUS
  Filled 2012-07-25: qty 2

## 2012-07-25 MED ORDER — LORAZEPAM 2 MG/ML IJ SOLN
2.0000 mg | Freq: Once | INTRAMUSCULAR | Status: AC
  Administered 2012-07-25: 2 mg via INTRAVENOUS
  Filled 2012-07-25: qty 1

## 2012-07-25 MED ORDER — LORAZEPAM 1 MG PO TABS: 1.0000 mg | ORAL_TABLET | Freq: Once | ORAL | Status: DC

## 2012-07-25 MED ORDER — HYDROMORPHONE HCL PF 1 MG/ML IJ SOLN
1.0000 mg | Freq: Once | INTRAMUSCULAR | Status: AC
  Administered 2012-07-25: 1 mg via INTRAVENOUS
  Filled 2012-07-25: qty 1

## 2012-07-25 MED ORDER — CLONIDINE HCL 0.2 MG PO TABS: 0.1000 mg | ORAL_TABLET | Freq: Three times a day (TID) | ORAL | Status: DC | PRN

## 2012-07-25 MED ORDER — LORAZEPAM 1 MG PO TABS: 2.0000 mg | ORAL_TABLET | Freq: Four times a day (QID) | ORAL | Status: DC | PRN

## 2012-07-25 MED ORDER — SODIUM CHLORIDE 0.9 % IV BOLUS (SEPSIS)
1000.0000 mL | Freq: Once | INTRAVENOUS | Status: AC
  Administered 2012-07-25: 1000 mL via INTRAVENOUS

## 2012-07-25 MED ORDER — HYDROCODONE-ACETAMINOPHEN 7.5-300 MG PO TABS: 1.0000 | ORAL_TABLET | ORAL | Status: DC | PRN

## 2012-07-25 NOTE — Progress Notes (Signed)
To come in for bmet-or if cannot-come 15 min earlier dos Did have an allergic type reaction one month ago where uvula swelled-sob-carries epi pen-does not know the trigger Had ekg 4/13

## 2012-07-25 NOTE — ED Notes (Signed)
Pt c/o of withdrawals from Suboxone. Pt was order to stop taking the medication because of hand surgery Monday. Pt reports tremors, chills, n/d. Pt is not diaphoretic. Denies auditory/visual hallucinations. Pt reports last dose Monday a week ago. Pt is very restless, A&O4.

## 2012-07-25 NOTE — ED Provider Notes (Addendum)
History     CSN: [id number]  Arrival date & time 07/25/12  2016   First MD Initiated Contact with Patient 07/25/12 2127      No chief complaint on file.   (Consider location/radiation/quality/duration/timing/severity/associated sxs/prior treatment) The history is provided by the patient.   patient here with withdrawal symptoms from her Suboxone. Has not had her medications and for over a week now. He started having tremors with nausea. No hallucinations noted. Some diarrhea. She is currently taking Vicodin but is not helping. No fever or neck pain or photophobia  Past Medical History  Diagnosis Date  . Anxiety   . Depression   . GERD (gastroesophageal reflux disease)   . Esophageal stricture   . Hyperlipemia   . Hypertension   . History of alcohol abuse   . Migraine headache   . History of cocaine abuse   . Bipolar disorder   . Pancreatitis, acute   . Obesity     Past Surgical History  Procedure Date  . Appendectomy   . Cholecystectomy   . Abdominal hysterectomy   . Cesarean section   . Knee surgery 2010    leftx3  . Nasal septum surgery     Family History  Problem Relation Age of Onset  . Colon cancer Neg Hx   . Heart disease Maternal Grandmother   . Kidney cancer Mother   . Hypertension Mother   . Cancer Mother   . Lung cancer Maternal Aunt     History  Substance Use Topics  . Smoking status: Current Some Day Smoker -- 1.0 packs/day for 20 years    Types: Cigarettes  . Smokeless tobacco: Never Used  . Alcohol Use: No     10 yr    OB History    Grav Para Term Preterm Abortions TAB SAB Ect Mult Living                  Review of Systems  All other systems reviewed and are negative.    Allergies  Imitrex; Metoclopramide hcl; Morphine and related; Tegretol; and Topamax  Home Medications   Current Outpatient Rx  Name Route Sig Dispense Refill  . ALPRAZOLAM 1 MG PO TABS Oral Take 1 mg by mouth 3 (three) times daily as needed. For anxiety      . MAGIC MOUTHWASH W/LIDOCAINE Oral Take 5 mLs by mouth 4 (four) times daily as needed. For sore throat.    Marland Kitchen AMLODIPINE BESYLATE 10 MG PO TABS Oral Take 1 tablet (10 mg total) by mouth daily. 90 tablet 1  . AMPHETAMINE-DEXTROAMPHETAMINE 15 MG PO TABS Oral Take 1 tablet (15 mg total) by mouth 3 (three) times daily. 90 tablet 0  . BETAMETHASONE DIPROPIONATE 0.05 % EX OINT Topical Apply 1 application topically 2 (two) times daily as needed. For rashes on hands.    Marland Kitchen BUPRENORPHINE HCL-NALOXONE HCL 8-2 MG SL SUBL Sublingual Place 1 tablet under the tongue daily.     Marland Kitchen CLONIDINE HCL 0.1 MG PO TABS Oral Take 0.1 mg by mouth at bedtime.    Marland Kitchen PANTOPRAZOLE SODIUM 40 MG PO TBEC Oral Take 40 mg by mouth daily.    . SERTRALINE HCL 100 MG PO TABS Oral Take 100 mg by mouth daily.    . TRIAMCINOLONE ACETONIDE 0.1 % MT PSTE Oral Take 1 application by mouth 2 (two) times daily as needed. Applied to tongue sores as needed    . EPINEPHRINE 0.3 MG/0.3ML IJ DEVI Intramuscular Inject 0.3 mLs (0.3  mg total) into the muscle as needed (inject at time of signs of anaphylaxis). 2 Device 0    BP 144/90  Pulse 103  Temp 97.8 F (36.6 C) (Oral)  Resp 24  Ht 5\' 6"  (1.676 m)  Wt 182 lb (82.555 kg)  BMI 29.38 kg/m2  SpO2 100%  Physical Exam  Nursing note and vitals reviewed. Constitutional: She is oriented to person, place, and time. She appears well-developed and well-nourished.  Non-toxic appearance. No distress.  HENT:  Head: Normocephalic and atraumatic.  Eyes: Conjunctivae normal, EOM and lids are normal. Pupils are equal, round, and reactive to light.  Neck: Normal range of motion. Neck supple. No tracheal deviation present. No mass present.  Cardiovascular: Normal rate, regular rhythm and normal heart sounds.  Exam reveals no gallop.   No murmur heard. Pulmonary/Chest: Effort normal and breath sounds normal. No stridor. No respiratory distress. She has no decreased breath sounds. She has no wheezes. She has no  rhonchi. She has no rales.  Abdominal: Soft. Normal appearance and bowel sounds are normal. She exhibits no distension. There is no tenderness. There is no rebound and no CVA tenderness.  Musculoskeletal: Normal range of motion. She exhibits no edema and no tenderness.  Neurological: She is alert and oriented to person, place, and time. She has normal strength. No cranial nerve deficit or sensory deficit. GCS eye subscore is 4. GCS verbal subscore is 5. GCS motor subscore is 6.  Skin: Skin is warm and dry. No abrasion and no rash noted.  Psychiatric: Her speech is normal. Her mood appears anxious. Her affect is blunt.    ED Course  Procedures (including critical care time)  Labs Reviewed - No data to display No results found.   No diagnosis found.    MDM  Patient to be given IV fluids and medications for her opiate withdrawal. The plan is for her to be placed on a higher dose of opiates and also given Ativan along with clonidine. I discussed the above with the orthopedic surgeon who will be doing her surgery on Monday        Toy Baker, MD 07/25/12 1610  Toy Baker, MD 07/25/12 2240

## 2012-07-27 ENCOUNTER — Ambulatory Visit (INDEPENDENT_AMBULATORY_CARE_PROVIDER_SITE_OTHER): Payer: 59 | Admitting: Physician Assistant

## 2012-07-27 VITALS — BP 130/82 | HR 96 | Temp 98.3°F | Resp 20 | Ht 66.0 in | Wt 188.0 lb

## 2012-07-27 DIAGNOSIS — M25539 Pain in unspecified wrist: Secondary | ICD-10-CM

## 2012-07-27 DIAGNOSIS — F19939 Other psychoactive substance use, unspecified with withdrawal, unspecified: Secondary | ICD-10-CM

## 2012-07-27 MED ORDER — HYDROCODONE-ACETAMINOPHEN 10-325 MG PO TABS: 1.0000 | ORAL_TABLET | Freq: Three times a day (TID) | ORAL | Status: DC | PRN

## 2012-07-27 NOTE — Progress Notes (Signed)
  Subjective:    Patient ID: [name], female    DOB: 06/12/1971, 41 y.o.   MRN: [id number]  HPI This 41 y.o. female presents for evaluation of withdrawal symptoms.  Has taken no suboxone in a week.  Initially d/c'd it in preparation for carpal tunnel release, and was given hydrocodone 10 mg, with plans to use oxycodone immediately post-operatively, and then quickly taper off narcotics.  She'd hoped not to go back on suboxone, frustrated that the treatment plan called for such a long taper. Her surgery was delayed for scheduling reasons, and she ran out of the hydrocodone.  She presented to the ED 07/25/2012 in withdrawal, experiencing nausea, agitation and diarrhea.  The ED note is reviewed. She was given hydrocodone 7.5 mg, which isn't helping.  From a withdrawal standpoint, her symptoms are nearly resolved.  But the pain in her hand and wrist are severe.  Review of Systems As above.    Objective:   Physical Exam  Blood pressure 130/82, pulse 96, temperature 98.3 F (36.8 C), temperature source Oral, resp. rate 20, height 5\' 6"  (1.676 m), weight 188 lb (85.276 kg), SpO2 100.00%. Body mass index is 30.34 kg/(m^2). Well-developed, well nourished WF who is awake, alert and oriented, in NAD. HEENT: [place]/AT, PERRL, EOMI.  Sclera and conjunctiva are clear.  OP is clear. Neck: supple, non-tender, no lymphadenopathy, thyromegaly. Heart: RRR, no murmur Lungs: normal effort, CTA Extremities: no cyanosis, clubbing or edema. Skin: warm and dry without rash. Psychologic: normal mood, affect.  Speech and behavior are normal/appropriate.     Assessment & Plan:   1. Wrist pain  HYDROcodone-acetaminophen (NORCO) 10-325 MG per tablet  2. Medication withdrawal  HYDROcodone-acetaminophen (NORCO) 10-325 MG per tablet   I will manage her pain after the acute post-operative period, with plans to rapidly reduce and discontinue narcotics.  The patient is in agreement.

## 2012-07-29 ENCOUNTER — Encounter (HOSPITAL_BASED_OUTPATIENT_CLINIC_OR_DEPARTMENT_OTHER): Payer: Self-pay

## 2012-07-29 ENCOUNTER — Ambulatory Visit (HOSPITAL_BASED_OUTPATIENT_CLINIC_OR_DEPARTMENT_OTHER)
Admission: RE | Admit: 2012-07-29 | Discharge: 2012-07-29 | Disposition: A | Discharge status: Home / Self Care | Payer: 59 | Source: Ambulatory Visit | Attending: Orthopaedic Surgery | Admitting: Orthopaedic Surgery

## 2012-07-29 ENCOUNTER — Ambulatory Visit (HOSPITAL_BASED_OUTPATIENT_CLINIC_OR_DEPARTMENT_OTHER): Payer: 59 | Admitting: Certified Registered Nurse Anesthetist

## 2012-07-29 ENCOUNTER — Encounter (HOSPITAL_BASED_OUTPATIENT_CLINIC_OR_DEPARTMENT_OTHER): Payer: Self-pay | Admitting: Certified Registered Nurse Anesthetist

## 2012-07-29 ENCOUNTER — Encounter (HOSPITAL_BASED_OUTPATIENT_CLINIC_OR_DEPARTMENT_OTHER)
Admission: RE | Disposition: A | Discharge status: Home / Self Care | Payer: Self-pay | Source: Ambulatory Visit | Attending: Orthopaedic Surgery

## 2012-07-29 DIAGNOSIS — K219 Gastro-esophageal reflux disease without esophagitis: Secondary | ICD-10-CM | POA: Insufficient documentation

## 2012-07-29 DIAGNOSIS — G4730 Sleep apnea, unspecified: Secondary | ICD-10-CM | POA: Insufficient documentation

## 2012-07-29 DIAGNOSIS — F411 Generalized anxiety disorder: Secondary | ICD-10-CM | POA: Insufficient documentation

## 2012-07-29 DIAGNOSIS — F17200 Nicotine dependence, unspecified, uncomplicated: Secondary | ICD-10-CM | POA: Insufficient documentation

## 2012-07-29 DIAGNOSIS — F329 Major depressive disorder, single episode, unspecified: Secondary | ICD-10-CM | POA: Insufficient documentation

## 2012-07-29 DIAGNOSIS — M25539 Pain in unspecified wrist: Secondary | ICD-10-CM

## 2012-07-29 DIAGNOSIS — F3289 Other specified depressive episodes: Secondary | ICD-10-CM | POA: Insufficient documentation

## 2012-07-29 DIAGNOSIS — G5600 Carpal tunnel syndrome, unspecified upper limb: Secondary | ICD-10-CM | POA: Insufficient documentation

## 2012-07-29 DIAGNOSIS — I10 Essential (primary) hypertension: Secondary | ICD-10-CM | POA: Insufficient documentation

## 2012-07-29 DIAGNOSIS — F19939 Other psychoactive substance use, unspecified with withdrawal, unspecified: Secondary | ICD-10-CM

## 2012-07-29 HISTORY — PX: CARPAL TUNNEL RELEASE: SHX101

## 2012-07-29 LAB — POCT I-STAT, CHEM 8
Calcium, Ion: 1.26 mmol/L — ABNORMAL HIGH (ref 1.12–1.23)
Chloride: 109 mEq/L (ref 96–112)
Glucose, Bld: 108 mg/dL — ABNORMAL HIGH (ref 70–99)
HCT: 38 % (ref 36.0–46.0)
Hemoglobin: 12.9 g/dL (ref 12.0–15.0)

## 2012-07-29 SURGERY — CARPAL TUNNEL RELEASE
Anesthesia: Monitor Anesthesia Care | Site: Hand | Laterality: Right | Wound class: Clean

## 2012-07-29 MED ORDER — ONDANSETRON HCL 4 MG/2ML IJ SOLN
INTRAMUSCULAR | Status: DC | PRN
  Administered 2012-07-29: 4 mg via INTRAVENOUS

## 2012-07-29 MED ORDER — FENTANYL CITRATE 0.05 MG/ML IJ SOLN
INTRAMUSCULAR | Status: DC | PRN
  Administered 2012-07-29: 50 ug via INTRAVENOUS

## 2012-07-29 MED ORDER — PROPOFOL INFUSION 10 MG/ML OPTIME
INTRAVENOUS | Status: DC | PRN
  Administered 2012-07-29: 100 ug/kg/min via INTRAVENOUS

## 2012-07-29 MED ORDER — LIDOCAINE HCL (CARDIAC) 20 MG/ML IV SOLN
INTRAVENOUS | Status: DC | PRN
  Administered 2012-07-29: 60 mg via INTRAVENOUS

## 2012-07-29 MED ORDER — MIDAZOLAM HCL 5 MG/5ML IJ SOLN
INTRAMUSCULAR | Status: DC | PRN
  Administered 2012-07-29: 2 mg via INTRAVENOUS

## 2012-07-29 MED ORDER — LACTATED RINGERS IV SOLN
INTRAVENOUS | Status: DC
  Administered 2012-07-29 (×2): via INTRAVENOUS

## 2012-07-29 MED ORDER — 0.9 % SODIUM CHLORIDE (POUR BTL) OPTIME
TOPICAL | Status: DC | PRN
  Administered 2012-07-29: 250 mL

## 2012-07-29 MED ORDER — FENTANYL CITRATE 0.05 MG/ML IJ SOLN
25.0000 ug | INTRAMUSCULAR | Status: DC | PRN
  Administered 2012-07-29: 50 ug via INTRAVENOUS

## 2012-07-29 MED ORDER — ONDANSETRON HCL 4 MG/2ML IJ SOLN: 4.0000 mg | Freq: Four times a day (QID) | INTRAMUSCULAR | Status: DC | PRN

## 2012-07-29 MED ORDER — LIDOCAINE HCL 1 % IJ SOLN
INTRAMUSCULAR | Status: DC | PRN
  Administered 2012-07-29: 12:00:00 via INTRAMUSCULAR

## 2012-07-29 SURGICAL SUPPLY — 39 items
APL SKNCLS STERI-STRIP NONHPOA (GAUZE/BANDAGES/DRESSINGS)
BANDAGE ELASTIC 4 VELCRO ST LF (GAUZE/BANDAGES/DRESSINGS) ×2 IMPLANT
BENZOIN TINCTURE PRP APPL 2/3 (GAUZE/BANDAGES/DRESSINGS) IMPLANT
BLADE SURG 15 STRL LF DISP TIS (BLADE) ×1 IMPLANT
BLADE SURG 15 STRL SS (BLADE) ×2
BNDG CMPR 9X4 STRL LF SNTH (GAUZE/BANDAGES/DRESSINGS) ×1
BNDG ESMARK 4X9 LF (GAUZE/BANDAGES/DRESSINGS) ×1 IMPLANT
CORDS BIPOLAR (ELECTRODE) ×2 IMPLANT
COVER MAYO STAND STRL (DRAPES) ×2 IMPLANT
COVER TABLE BACK 60X90 (DRAPES) ×2 IMPLANT
DRAPE EXTREMITY T 121X128X90 (DRAPE) ×2 IMPLANT
DURAPREP 26ML APPLICATOR (WOUND CARE) ×2 IMPLANT
GAUZE XEROFORM 1X8 LF (GAUZE/BANDAGES/DRESSINGS) ×2 IMPLANT
GLOVE BIO SURGEON STRL SZ7 (GLOVE) ×1 IMPLANT
GLOVE BIO SURGEON STRL SZ7.5 (GLOVE) ×1 IMPLANT
GLOVE BIOGEL PI IND STRL 7.0 (GLOVE) IMPLANT
GLOVE BIOGEL PI INDICATOR 7.0 (GLOVE) ×1
GOWN PREVENTION PLUS XLARGE (GOWN DISPOSABLE) ×2 IMPLANT
GOWN PREVENTION PLUS XXLARGE (GOWN DISPOSABLE) ×1 IMPLANT
LOOP VESSEL MAXI BLUE (MISCELLANEOUS) IMPLANT
NDL HYPO 25X1 1.5 SAFETY (NEEDLE) IMPLANT
NEEDLE HYPO 25X1 1.5 SAFETY (NEEDLE) ×2 IMPLANT
NS IRRIG 1000ML POUR BTL (IV SOLUTION) ×2 IMPLANT
PACK BASIN DAY SURGERY FS (CUSTOM PROCEDURE TRAY) ×2 IMPLANT
PAD CAST 3X4 CTTN HI CHSV (CAST SUPPLIES) ×1 IMPLANT
PADDING CAST ABS 4INX4YD NS (CAST SUPPLIES) ×1
PADDING CAST ABS COTTON 4X4 ST (CAST SUPPLIES) ×1 IMPLANT
PADDING CAST COTTON 3X4 STRL (CAST SUPPLIES) ×2
SPLINT PLASTER CAST XFAST 3X15 (CAST SUPPLIES) IMPLANT
SPLINT PLASTER XTRA FASTSET 3X (CAST SUPPLIES)
SPONGE GAUZE 4X4 12PLY (GAUZE/BANDAGES/DRESSINGS) ×1 IMPLANT
STOCKINETTE 4X48 STRL (DRAPES) ×2 IMPLANT
STRIP CLOSURE SKIN 1/2X4 (GAUZE/BANDAGES/DRESSINGS) IMPLANT
SUT ETHILON 4 0 PS 2 18 (SUTURE) ×2 IMPLANT
SYR BULB 3OZ (MISCELLANEOUS) ×2 IMPLANT
SYR CONTROL 10ML LL (SYRINGE) IMPLANT
TOWEL OR 17X24 6PK STRL BLUE (TOWEL DISPOSABLE) ×2 IMPLANT
UNDERPAD 30X30 INCONTINENT (UNDERPADS AND DIAPERS) ×2 IMPLANT
WATER STERILE IRR 1000ML POUR (IV SOLUTION) ×1 IMPLANT

## 2012-07-29 NOTE — Brief Op Note (Signed)
07/29/2012  11:48 AM  PATIENT:  [name]  41 y.o. female  PRE-OPERATIVE DIAGNOSIS:  right carpal tunnel syndrome  POST-OPERATIVE DIAGNOSIS:  right carpal tunnel syndrome  PROCEDURE:  Procedure(s) (LRB) with comments: CARPAL TUNNEL RELEASE (Right) - Right carpal tunnel release  SURGEON:  Surgeon(s) and Role:    * Eldred Manges, MD - Primary  PHYSICIAN ASSISTANT:   ASSISTANTS: none   ANESTHESIA:   local and IV sedation  EBL:  Total I/O In: 1300 [I.V.:1300] Out: -   BLOOD ADMINISTERED:none  DRAINS: none   LOCAL MEDICATIONS USED:  MARCAINE    and LIDOCAINE   SPECIMEN:  No Specimen  DISPOSITION OF SPECIMEN:  N/A  COUNTS:  YES  TOURNIQUET:   Total Tourniquet Time Documented: Upper Arm (Right) - 4 minutes  DICTATION: .Other Dictation: Dictation Number [id number]  PLAN OF CARE: outpatient  PATIENT DISPOSITION:  PACU - hemodynamically stable.   Delay start of Pharmacological VTE agent (>24hrs) due to surgical blood loss or risk of bleeding: not applicable

## 2012-07-29 NOTE — Op Note (Signed)
Test 121  Preop diagnosis right carpal tunnel syndrome  Postop diagnosis: Right carpal tunnel syndrome  Procedure : right carpal total release  Surgeon Annell Greening M.D.   anesthesia : IV sedation +4 cc local Marcaine plus lidocaine one-to-one mixture without epinephrine  Procedure: After induction of IV sedation standard prepping breadloafed proximal arm tourniquet on the right arm informed consent had been obtained no antibiotics were given. Arm was prepped with DuraPrep. Stockinet externally sheets and drapes were applied. Sterile skin marker was used to plan incision timeout procedure was completed.  After inflation of the tourniquet after using the Esmarch incision was made following the skin marker incision for thenar crease incision using superficial anatomy the radial border of the ring finger and ulnar border of the thumb. Her fascia is divided subtendinous bleeders were controlled with the bipolar cautery. Palmar fascia was split palmaris brevis muscle was split. Transverse carpal ligament was divided from proximal to distal following the ulnar border of the median nerve. There is chronic tenosynovitis changes no masses in the canal.  Thenar motor branch was distal and radial in its take off. Complete release was performed with palmar fat vascular arch visualized distally. Proximally the distal forearm fascia was split tip was introduced proximally and distally with number is compression tourniquet was deflated small bleeders controlled with bipolar cautery irrigation with saline solution and then standard nylon skin closure. Xeroform 4 fours finger tweeners were placed. Labral Ace wrap. Marcaine was used at the beginning case for a skin incision patient tolerated the procedure well transferred to her room in stable condition.  Annell Greening M.D.

## 2012-07-29 NOTE — Anesthesia Procedure Notes (Signed)
Procedure Name: MAC Performed by: Leondra Cullin D Pre-anesthesia Checklist: Patient identified, Emergency Drugs available, Suction available and Timeout performed Patient Re-evaluated:Patient Re-evaluated prior to inductionOxygen Delivery Method: Simple face mask Preoxygenation: Pre-oxygenation with 100% oxygen

## 2012-07-29 NOTE — H&P (Signed)
[name] is an 41 y.o. female.   Chief Complaint: right hand numbness,  Positive NCV for CTS HPI: 41 yo chronic pain medication patient has been on suboxone times 18 months with right hand numbness. Failed splinting , injection.   Past Medical History  Diagnosis Date  . Anxiety   . Depression   . GERD (gastroesophageal reflux disease)   . Esophageal stricture   . Hyperlipemia   . Hypertension   . History of alcohol abuse   . Migraine headache   . History of cocaine abuse   . Bipolar disorder   . Pancreatitis, acute   . Obesity     Past Surgical History  Procedure Date  . Appendectomy   . Cholecystectomy   . Abdominal hysterectomy   . Cesarean section   . Knee surgery 2010    leftx3  . Nasal septum surgery     Family History  Problem Relation Age of Onset  . Colon cancer Neg Hx   . Heart disease Maternal Grandmother   . Kidney cancer Mother   . Hypertension Mother   . Cancer Mother   . Lung cancer Maternal Aunt    Social History:  reports that she has been smoking Cigarettes.  She has a 20 pack-year smoking history. She has never used smokeless tobacco. She reports that she uses illicit drugs (Cocaine). She reports that she does not drink alcohol.  Allergies:  Allergies  Allergen Reactions  . Imitrex (Sumatriptan Base) Shortness Of Breath  . Metoclopramide Hcl Itching  . Morphine And Related Itching  . Tegretol (Carbamazepine) Itching  . Topamax Itching    Medications Prior to Admission  Medication Sig Dispense Refill  . ALPRAZolam (XANAX) 1 MG tablet Take 1 mg by mouth 3 (three) times daily as needed. For anxiety      . amLODipine (NORVASC) 10 MG tablet Take 1 tablet (10 mg total) by mouth daily.  90 tablet  1  . amphetamine-dextroamphetamine (ADDERALL) 15 MG tablet Take 1 tablet (15 mg total) by mouth 3 (three) times daily.  90 tablet  0  . betamethasone dipropionate (DIPROLENE) 0.05 % ointment Apply 1 application topically 2 (two) times daily as needed.  For rashes on hands.      . cloNIDine (CATAPRES) 0.2 MG tablet Take 0.5 tablets (0.1 mg total) by mouth 3 (three) times daily as needed (withdrawl symptoms).  30 tablet  0  . pantoprazole (PROTONIX) 40 MG tablet Take 40 mg by mouth daily.      . sertraline (ZOLOFT) 100 MG tablet Take 100 mg by mouth daily.      Marland Kitchen triamcinolone (KENALOG) 0.1 % paste Take 1 application by mouth 2 (two) times daily as needed. Applied to tongue sores as needed      . EPINEPHrine (EPIPEN) 0.3 mg/0.3 mL DEVI Inject 0.3 mLs (0.3 mg total) into the muscle as needed (inject at time of signs of anaphylaxis).  2 Device  0  . HYDROcodone-acetaminophen (NORCO) 10-325 MG per tablet Take 1 tablet by mouth every 8 (eight) hours as needed for pain.  30 tablet  0  . LORazepam (ATIVAN) 1 MG tablet Take 2 tablets (2 mg total) by mouth every 6 (six) hours as needed for anxiety.  20 tablet  0    Results for orders placed during the hospital encounter of 07/29/12 (from the past 48 hour(s))  POCT I-STAT, CHEM 8     Status: Abnormal   Collection Time   07/29/12  9:46 AM  Component Value Range Comment   Sodium 144  135 - 145 mEq/L    Potassium 3.8  3.5 - 5.1 mEq/L    Chloride 109  96 - 112 mEq/L    BUN 10  6 - 23 mg/dL    Creatinine, Ser 1.61  0.50 - 1.10 mg/dL    Glucose, Bld 096 (*) 70 - 99 mg/dL    Calcium, Ion 0.45 (*) 1.12 - 1.23 mmol/L    TCO2 21  0 - 100 mmol/L    Hemoglobin 12.9  12.0 - 15.0 g/dL    HCT 40.9  81.1 - 91.4 %    No results found.  Review of Systems  Constitutional: Negative.   HENT: Negative.   Gastrointestinal: Negative.   Skin: Negative.   Neurological: Positive for tingling.  Psychiatric/Behavioral: Positive for depression.       Hx of Heroin use , has been on suboxone , no Heroin times 18 months    Blood pressure 120/82, pulse 77, temperature 98.8 F (37.1 C), temperature source Oral, resp. rate 20, height 5\' 6"  (1.676 m), weight 85.276 kg (188 lb), SpO2 97.00%. Physical Exam    Constitutional: She is oriented to person, place, and time. She appears well-developed and well-nourished.  HENT:  Head: Normocephalic.  Eyes: Pupils are equal, round, and reactive to light.  Neck: Normal range of motion.  Cardiovascular: Normal rate.   Respiratory: Effort normal.  GI: Soft.  Musculoskeletal:       Pos. tinels pos. phalens  Neurological: She is alert and oriented to person, place, and time.  Skin: Skin is warm and dry.     Assessment/Plan Right CTS for CTR.   Discussed with pt. Problems with pain, pain meds etc.    [name] C 07/29/2012, 11:10 AM

## 2012-07-29 NOTE — Interval H&P Note (Signed)
History and Physical Interval Note:  07/29/2012 11:14 AM  [name]  has presented today for surgery, with the diagnosis of right cts  The various methods of treatment have been discussed with the patient and family. After consideration of risks, benefits and other options for treatment, the patient has consented to  Procedure(s) (LRB) with comments: CARPAL TUNNEL RELEASE (Right) - right carpal tunnel release as a surgical intervention .  The patient's history has been reviewed, patient examined, no change in status, stable for surgery.  I have reviewed the patient's chart and labs.  Questions were answered to the patient's satisfaction.     Tameah Mihalko C

## 2012-07-29 NOTE — Transfer of Care (Signed)
Immediate Anesthesia Transfer of Care Note  Patient: [name]  Procedure(s) Performed: Procedure(s) (LRB) with comments: CARPAL TUNNEL RELEASE (Right) - Right carpal tunnel release  Patient Location: PACU  Anesthesia Type: General  Level of Consciousness: awake, alert , oriented and patient cooperative  Airway & Oxygen Therapy: Patient Spontanous Breathing  Post-op Assessment: Report given to PACU RN and Post -op Vital signs reviewed and stable  Post vital signs: Reviewed and stable  Complications: No apparent anesthesia complications

## 2012-07-29 NOTE — Anesthesia Preprocedure Evaluation (Signed)
Anesthesia Evaluation  Patient identified by MRN, date of birth, ID band Patient awake    Reviewed: Allergy & Precautions, H&P , NPO status , Patient's Chart, lab work & pertinent test results  Airway Mallampati: II  Neck ROM: full    Dental   Pulmonary sleep apnea , Current Smoker,          Cardiovascular hypertension,     Neuro/Psych  Headaches, Anxiety Depression  Neuromuscular disease    GI/Hepatic GERD-  ,  Endo/Other    Renal/GU      Musculoskeletal   Abdominal   Peds  Hematology   Anesthesia Other Findings   Reproductive/Obstetrics                           Anesthesia Physical Anesthesia Plan  ASA: II  Anesthesia Plan: MAC   Post-op Pain Management:    Induction: Intravenous  Airway Management Planned: Simple Face Mask  Additional Equipment:   Intra-op Plan:   Post-operative Plan:   Informed Consent: I have reviewed the patients History and Physical, chart, labs and discussed the procedure including the risks, benefits and alternatives for the proposed anesthesia with the patient or authorized representative who has indicated his/her understanding and acceptance.     Plan Discussed with: CRNA and Surgeon  Anesthesia Plan Comments:         Anesthesia Quick Evaluation

## 2012-07-29 NOTE — Anesthesia Postprocedure Evaluation (Signed)
Anesthesia Post Note  Patient: [name]  Procedure(s) Performed: Procedure(s) (LRB): CARPAL TUNNEL RELEASE (Right)  Anesthesia type: MAC  Patient location: PACU  Post pain: Pain level controlled and Adequate analgesia  Post assessment: Post-op Vital signs reviewed, Patient's Cardiovascular Status Stable and Respiratory Function Stable  Last Vitals:  Filed Vitals:   07/29/12 1153  BP:   Pulse:   Temp: 36.7 C  Resp:     Post vital signs: Reviewed and stable  Level of consciousness: awake, alert  and oriented  Complications: No apparent anesthesia complications

## 2012-07-30 ENCOUNTER — Encounter (HOSPITAL_BASED_OUTPATIENT_CLINIC_OR_DEPARTMENT_OTHER): Payer: Self-pay | Admitting: Orthopaedic Surgery

## 2012-08-01 ENCOUNTER — Telehealth: Payer: Self-pay

## 2012-08-01 NOTE — Telephone Encounter (Signed)
Once she's completed what Dr. Ophelia Charter plans in the immediate post-op period, I'll need to see her to clarify our plan.  I'll need to review his notes regarding her pain at that point.  We can do some follow-up by phone after that.

## 2012-08-01 NOTE — Telephone Encounter (Signed)
[name],  Pt would like to know if you are going to request her to come in and see you or handle the pain management discussion via phone.  [phone]

## 2012-08-01 NOTE — Telephone Encounter (Signed)
1.  Wrist pain  HYDROcodone-acetaminophen (NORCO) 10-325 MG per tablet   2.  Medication withdrawal  HYDROcodone-acetaminophen (NORCO) 10-325 MG per tablet    I will manage her pain after the acute post-operative period, with plans to rapidly reduce and discontinue narcotics. The patient is in agreement.  From your 07/27/12 office visit. Please advise. Looks like she is scheduled for CTR Oct 17

## 2012-08-02 ENCOUNTER — Ambulatory Visit (INDEPENDENT_AMBULATORY_CARE_PROVIDER_SITE_OTHER): Payer: 59 | Admitting: Physician Assistant

## 2012-08-02 VITALS — BP 120/80 | HR 108 | Temp 98.1°F | Resp 16 | Ht 66.0 in | Wt 183.0 lb

## 2012-08-02 DIAGNOSIS — M79643 Pain in unspecified hand: Secondary | ICD-10-CM

## 2012-08-02 DIAGNOSIS — F19939 Other psychoactive substance use, unspecified with withdrawal, unspecified: Secondary | ICD-10-CM

## 2012-08-02 DIAGNOSIS — Z23 Encounter for immunization: Secondary | ICD-10-CM

## 2012-08-02 DIAGNOSIS — G5600 Carpal tunnel syndrome, unspecified upper limb: Secondary | ICD-10-CM

## 2012-08-02 DIAGNOSIS — M79609 Pain in unspecified limb: Secondary | ICD-10-CM

## 2012-08-02 DIAGNOSIS — G5603 Carpal tunnel syndrome, bilateral upper limbs: Secondary | ICD-10-CM

## 2012-08-02 DIAGNOSIS — I10 Essential (primary) hypertension: Secondary | ICD-10-CM

## 2012-08-02 MED ORDER — AMLODIPINE BESYLATE 10 MG PO TABS: 10.0000 mg | ORAL_TABLET | Freq: Every day | ORAL | Status: DC

## 2012-08-02 MED ORDER — OXYCODONE-ACETAMINOPHEN 5-325 MG PO TABS: 1.0000 | ORAL_TABLET | Freq: Four times a day (QID) | ORAL | Status: DC | PRN

## 2012-08-02 NOTE — Telephone Encounter (Signed)
I called patient and her surgery was done on Monday, by Dr Ophelia Charter. Patient plans on seeing [name] today.

## 2012-08-02 NOTE — Progress Notes (Signed)
Subjective:    Patient ID: [name], female    DOB: July 28, 1971, 41 y.o.   MRN: [id number]  HPI This 41 y.o. female presents for evaluation of wrist pain, 4 days post-op RIGHT carpal tunnel release, and help with withdrawal from suboxone and pain medications.  Hydrocodone-APAP 10/325 is not relieving her pain at all, even taking 2 Q4 hours.  Her surgeon is not willing to prescribe oxycodone.  We have previously agreed that I would take back over her pain control after the post-operative period.  See my note 9/27.  She stopped the Suboxone in preparation for her surgery, with the plan to use hydrocodone until the procedure and then switch to oxycodone for post-op pain, and then rapidly decrease the narcotics use and discontinue it.  She does not plan to return to the Suboxone clinic because she wanted to get off it more quickly than they were willing to taper the dose.  Her surgery was delayed and she was seen in the ED with Suboxone withdrawal when her hydrocodone dose was not adequate.  The Suboxone clinic has also been prescribing her alprazolam, which I also agreed to take over.  I am very supportive of [name] getting off the chronic use of narcotics, and she has a very supportive wife as well.  They both have good insight into substance abuse and the difficulty it presents for both patients with chronic pain and dependence, and for healthcare providers.    Review of Systems Post-operative RIGHT wrist pain.  No chest pain, SOB, HA, dizziness, vision change, N/V, diarrhea, constipation, dysuria, urinary urgency or frequency, or rash.    Past Medical History  Diagnosis Date  . Anxiety   . Depression   . GERD (gastroesophageal reflux disease)   . Esophageal stricture   . Hyperlipemia   . Hypertension   . History of alcohol abuse   . Migraine headache   . History of cocaine abuse   . Bipolar disorder   . Pancreatitis, acute   . Obesity     Past Surgical History  Procedure Date  .  Appendectomy   . Cholecystectomy   . Abdominal hysterectomy   . Cesarean section   . Knee surgery 2010    leftx3  . Nasal septum surgery   . Carpal tunnel release 07/29/2012    Procedure: CARPAL TUNNEL RELEASE;  Surgeon: Eldred Manges, MD;  Location: [facility];  Service: Orthopedics;  Laterality: Right;  Right carpal tunnel release    Prior to Admission medications   Medication Sig Start Date End Date Taking? Authorizing Provider  ALPRAZolam Prudy Feeler) 1 MG tablet Take 1 mg by mouth 3 (three) times daily as needed. For anxiety   Yes Historical Provider, MD  amLODipine (NORVASC) 10 MG tablet Take 1 tablet (10 mg total) by mouth daily. 08/02/12  Yes Dayyan Krist S Cletis Clack, PA-C  amphetamine-dextroamphetamine (ADDERALL) 15 MG tablet Take 1 tablet (15 mg total) by mouth 3 (three) times daily. 07/12/12  Yes Heather M Marte, PA-C  betamethasone dipropionate (DIPROLENE) 0.05 % ointment Apply 1 application topically 2 (two) times daily as needed. For rashes on hands.   Yes Historical Provider, MD  cloNIDine (CATAPRES) 0.2 MG tablet Take 0.5 tablets (0.1 mg total) by mouth 3 (three) times daily as needed (withdrawl symptoms). 07/25/12  Yes Toy Baker, MD  EPINEPHrine (EPIPEN) 0.3 mg/0.3 mL DEVI Inject 0.3 mLs (0.3 mg total) into the muscle as needed (inject at time of signs of anaphylaxis). 06/04/12  Yes Tatyana A Kirichenko, PA  pantoprazole (PROTONIX) 40 MG tablet Take 40 mg by mouth daily.   Yes Historical Provider, MD  sertraline (ZOLOFT) 100 MG tablet Take 100 mg by mouth daily.   Yes Historical Provider, MD  triamcinolone (KENALOG) 0.1 % paste Take 1 application by mouth 2 (two) times daily as needed. Applied to tongue sores as needed   Yes Historical Provider, MD  hydr0CODONE-acetaminophen (Norco) 10-325 MG per tablet Take 1-2 tablets by mouth every 6 (six) hours as needed for pain.    [name], PA-C    Allergies  Allergen Reactions  . Imitrex (Sumatriptan Base) Shortness Of  Breath  . Metoclopramide Hcl Itching  . Morphine And Related Itching  . Tegretol (Carbamazepine) Itching  . Topamax Itching    History   Social History  . Marital Status: Significant Other    Spouse Name: [name]    Number of Children: 2  . Years of Education: N/A   Occupational History  . CNA    Social History Main Topics  . Smoking status: Current Some Day Smoker -- 1.0 packs/day for 20 years    Types: Cigarettes  . Smokeless tobacco: Never Used  . Alcohol Use: No     10 yr  . Drug Use: Yes    Special: Cocaine     not in 2 yr  . Sexually Active: Yes -- Female partner(s)   Other Topics Concern  . Not on file   Social History Narrative   3 caffeine drinks daily Student in substance abuse counseling program.Has been in Suboxone clinic since 2011 to get off narcotics.    Family History  Problem Relation Age of Onset  . Colon cancer Neg Hx   . Heart disease Maternal Grandmother   . Kidney cancer Mother   . Hypertension Mother   . Cancer Mother   . Lung cancer Maternal Aunt        Objective:   Physical Exam  Blood pressure 120/80, pulse 108, temperature 98.1 F (36.7 C), resp. rate 16, height 5\' 6"  (1.676 m), weight 183 lb (83.008 kg). Body mass index is 29.54 kg/(m^2). Well-developed, well nourished WF who is awake, alert and oriented, in NAD. HEENT: [place]/AT, sclera and conjunctiva are clear.   Heart: RRR, no murmur Lungs: normal effort, CTA Extremities: no cyanosis, clubbing or edema. RIGHT wrist in ACE wrap over bandage. Skin: warm and dry without rash. Psychologic: good mood, appropriate affect, normal speech and behavior.       Assessment & Plan:   1. Carpal tunnel syndrome, bilateral  S/p release 07/29/2012 with Dr. Ophelia Charter  2. Pain, hand  oxyCODONE-acetaminophen (PERCOCET/ROXICET) 5-325 MG per tablet  3. Withdrawal syndrome  RTC 1 week, sooner if needs RF sooner.  Plan is to rapidly reduce and D/C as quickly as possible.  4. Need for influenza  vaccination  Flu vaccine greater than or equal to 3yo preservative free IM  5. HTN (hypertension)  amLODipine (NORVASC) 10 MG tablet

## 2012-08-02 NOTE — Patient Instructions (Signed)
Monday 9a-1p, 6p-8p Tuesday 9a-5p Wednesday 1p-8p Thursday 8a-1p Friday 11a-6p

## 2012-08-04 ENCOUNTER — Ambulatory Visit (INDEPENDENT_AMBULATORY_CARE_PROVIDER_SITE_OTHER): Payer: 59 | Admitting: Emergency Medicine

## 2012-08-04 VITALS — BP 128/84 | HR 95 | Temp 98.5°F | Resp 17 | Ht 65.5 in | Wt 181.0 lb

## 2012-08-04 DIAGNOSIS — R110 Nausea: Secondary | ICD-10-CM

## 2012-08-04 MED ORDER — OXYCODONE HCL 5 MG PO TABS: 5.0000 mg | ORAL_TABLET | ORAL | Status: DC | PRN

## 2012-08-04 NOTE — Progress Notes (Signed)
[facility] [address], [place] [address] [phone]  Date:  08/04/2012   Name:  [name]   DOB:  [birth year]   MRN:  [id number]  PCP:  Herma Uballe,[name], PA-C    Chief Complaint: Medication Reaction   History of Present Illness:  [name] is a 41 y.o. very pleasant female patient who presents with the following:  Had carpal tunnel release on Monday a week ago. Now experiencing nausea and she thinks it may be due to the tylenol in her pain medication.  Has history of suboxone therapy for narcotic abuse (2 years).  Has been lifting boxes and packing as they are moving.  No icterus Pain from carpal tunnel syndrome is markedly improved.  No numbness or tingling.   Patient Active Problem List  Diagnosis  . Abdominal pain, epigastric  . Migraine headache  . Bipolar disorder  . History of substance abuse  . Hypertension  . Constipation  . OSA (obstructive sleep apnea)  . Carpal tunnel syndrome, bilateral  . Carpal tunnel syndrome of right wrist    Past Medical History  Diagnosis Date  . Anxiety   . Depression   . GERD (gastroesophageal reflux disease)   . Esophageal stricture   . Hyperlipemia   . Hypertension   . History of alcohol abuse   . Migraine headache   . History of cocaine abuse   . Bipolar disorder   . Pancreatitis, acute   . Obesity     Past Surgical History  Procedure Date  . Appendectomy   . Cholecystectomy   . Abdominal hysterectomy   . Cesarean section   . Knee surgery 2010    leftx3  . Nasal septum surgery   . Carpal tunnel release 07/29/2012    Procedure: CARPAL TUNNEL RELEASE;  Surgeon: Eldred Manges, MD;  Location: [facility];  Service: Orthopedics;  Laterality: Right;  Right carpal tunnel release    History  Substance Use Topics  . Smoking status: Current Some Day Smoker -- 1.0 packs/day for 20 years    Types: Cigarettes  . Smokeless tobacco: Never Used  . Alcohol Use: No     10 yr     Family History  Problem Relation Age of Onset  . Colon cancer Neg Hx   . Heart disease Maternal Grandmother   . Kidney cancer Mother   . Hypertension Mother   . Cancer Mother   . Lung cancer Maternal Aunt     Allergies  Allergen Reactions  . Imitrex (Sumatriptan Base) Shortness Of Breath  . Metoclopramide Hcl Itching  . Morphine And Related Itching  . Tegretol (Carbamazepine) Itching  . Topamax Itching    Medication list has been reviewed and updated.  Current Outpatient Prescriptions on File Prior to Visit  Medication Sig Dispense Refill  . ALPRAZolam (XANAX) 1 MG tablet Take 1 mg by mouth 3 (three) times daily as needed. For anxiety      . amLODipine (NORVASC) 10 MG tablet Take 1 tablet (10 mg total) by mouth daily.  90 tablet  1  . amphetamine-dextroamphetamine (ADDERALL) 15 MG tablet Take 1 tablet (15 mg total) by mouth 3 (three) times daily.  90 tablet  0  . betamethasone dipropionate (DIPROLENE) 0.05 % ointment Apply 1 application topically 2 (two) times daily as needed. For rashes on hands.      . cloNIDine (CATAPRES) 0.2 MG tablet Take 0.5 tablets (0.1 mg total) by mouth 3 (three)  times daily as needed (withdrawl symptoms).  30 tablet  0  . EPINEPHrine (EPIPEN) 0.3 mg/0.3 mL DEVI Inject 0.3 mLs (0.3 mg total) into the muscle as needed (inject at time of signs of anaphylaxis).  2 Device  0  . oxyCODONE-acetaminophen (PERCOCET/ROXICET) 5-325 MG per tablet Take 1-2 tablets by mouth every 6 (six) hours as needed for pain.  30 tablet  0  . pantoprazole (PROTONIX) 40 MG tablet Take 40 mg by mouth daily.      . sertraline (ZOLOFT) 100 MG tablet Take 100 mg by mouth daily.      Marland Kitchen triamcinolone (KENALOG) 0.1 % paste Take 1 application by mouth 2 (two) times daily as needed. Applied to tongue sores as needed        Review of Systems:  As per HPI, otherwise negative.    Physical Examination: Filed Vitals:   08/04/12 1649  BP: 128/84  Pulse: 95  Temp: 98.5 F (36.9 C)   Resp: 17   Filed Vitals:   08/04/12 1649  Height: 5' 5.5" (1.664 m)  Weight: 181 lb (82.101 kg)   Body mass index is 29.66 kg/(m^2). Ideal Body Weight: Weight in (lb) to have BMI = 25: 152.2    GEN: WDWN, NAD, Non-toxic, Alert & Oriented x 3 HEENT: Atraumatic, Normocephalic.  Ears and Nose: No external deformity. EXTR: No clubbing/cyanosis/edema NEURO: Normal gait.  PSYCH: Normally interactive. Conversant. Not depressed or anxious appearing.  Calm demeanor.  ABDOMEN:  Soft not tender  Assessment and Plan: post op carpal tunnel release. Change to oxcodone Follow up with Cathlean Cower, Tessa Lerner, MD   I have reviewed and agree with documentation. Robert P. Merla Riches, M.D.

## 2012-08-06 ENCOUNTER — Ambulatory Visit (INDEPENDENT_AMBULATORY_CARE_PROVIDER_SITE_OTHER): Payer: 59 | Admitting: Physician Assistant

## 2012-08-06 VITALS — BP 118/86 | HR 80 | Temp 98.0°F | Resp 16 | Ht 66.0 in | Wt 182.0 lb

## 2012-08-06 DIAGNOSIS — R52 Pain, unspecified: Secondary | ICD-10-CM

## 2012-08-06 MED ORDER — OXYCODONE HCL 60 MG PO TB12: 60.0000 mg | ORAL_TABLET | Freq: Two times a day (BID) | ORAL | Status: DC

## 2012-08-06 NOTE — Patient Instructions (Signed)
9-5   9-5 [phone]   1-9 1-9           

## 2012-08-06 NOTE — Progress Notes (Signed)
  Subjective:    Patient ID: [name], female    DOB: [birth year], 41 y.o.   MRN: [id number]  HPI  This 41 y.o. female presents for evaluation of pain s/p carpal tunnel release, and withdrawal from suboxone.  Recall she took suboxone for 2 years, and was frustrated at the very slow taper.  She then discontinued it in preparation for carpal tunnel release, but experienced significant withdrawal symptoms when her surgery was delayed and she had inadequate pain medication.  Once her surgery was performed, she thought she was far enough out to be on Percocet 5-10 mg for pain relief.  Unfortunately, she has continued to have hand pain and withdrawal symptoms of nausea, diarrhea, irritability and agitation.  She has found that 20 mg of oxycodone Q4 hours gives her relief, and hopes to try that for a week and then reduce by 5 mg weekly to D/C.  She is ready for her sutures to be removed, one week out from the carpal tunnel release.  She and her wife have just moved into a new home, and the activity associated with that may have exacerbated her pain.   Review of Systems As above.    Objective:   Physical Exam  Blood pressure 118/86, pulse 80, temperature 98 F (36.7 C), temperature source Oral, resp. rate 16, height 5\' 6"  (1.676 m), weight 182 lb (82.555 kg), SpO2 99.00%. Body mass index is 29.38 kg/(m^2). Well-developed, well nourished WF who is awake, alert and oriented, in NAD. HEENT: [place]/AT, PERRL, EOMI.  Sclera and conjunctiva are clear.   Lungs: normal effort. Extremities: no cyanosis, clubbing or edema. Skin: warm and dry without rash. Surgical site is well healed.  Sutures removed and steri-strips placed with benzoin. Psychologic: good mood and appropriate affect, normal speech and behavior.     Assessment & Plan:

## 2012-08-06 NOTE — Assessment & Plan Note (Addendum)
Oxycodone 20 mg Q4 hours is converted to Oxycontin 60 mg q12 hours.  RTC 1 week and plan to reduce to 55 mg q12 hours, and so on weekly. The patient and her wife are both in agreement with the plan.  She has leftover oxycodone immediate release 5 mg to use for rescue, but plans not to use it.

## 2012-08-08 ENCOUNTER — Other Ambulatory Visit: Payer: Self-pay | Admitting: Physician Assistant

## 2012-08-13 ENCOUNTER — Telehealth: Payer: Self-pay | Admitting: Radiology

## 2012-08-13 ENCOUNTER — Ambulatory Visit (INDEPENDENT_AMBULATORY_CARE_PROVIDER_SITE_OTHER): Payer: 59 | Admitting: Physician Assistant

## 2012-08-13 VITALS — BP 142/94 | HR 112 | Temp 99.5°F | Resp 18 | Ht 66.0 in | Wt 181.6 lb

## 2012-08-13 DIAGNOSIS — F419 Anxiety disorder, unspecified: Secondary | ICD-10-CM

## 2012-08-13 DIAGNOSIS — F988 Other specified behavioral and emotional disorders with onset usually occurring in childhood and adolescence: Secondary | ICD-10-CM

## 2012-08-13 DIAGNOSIS — R52 Pain, unspecified: Secondary | ICD-10-CM

## 2012-08-13 DIAGNOSIS — F411 Generalized anxiety disorder: Secondary | ICD-10-CM

## 2012-08-13 MED ORDER — OXYCODONE HCL 15 MG PO TB12: 15.0000 mg | ORAL_TABLET | Freq: Two times a day (BID) | ORAL | Status: DC

## 2012-08-13 MED ORDER — OXYCODONE HCL 40 MG PO TB12: 40.0000 mg | ORAL_TABLET | Freq: Two times a day (BID) | ORAL | Status: DC

## 2012-08-13 MED ORDER — AMPHETAMINE-DEXTROAMPHETAMINE 30 MG PO TABS: 15.0000 mg | ORAL_TABLET | Freq: Three times a day (TID) | ORAL | Status: DC

## 2012-08-13 MED ORDER — ALPRAZOLAM 1 MG PO TABS: 1.0000 mg | ORAL_TABLET | Freq: Three times a day (TID) | ORAL | Status: DC | PRN

## 2012-08-13 NOTE — Telephone Encounter (Signed)
Pharmacy called, patient wants quantity changed to #90 on Xanax, I advised will check with [name] and call back, patient takes tid. Please advise.

## 2012-08-13 NOTE — Progress Notes (Signed)
Subjective:    Patient ID: [name], female    DOB: 1971/07/19, 41 y.o.   MRN: [id number]  HPI This 41 y.o. female presents for evaluation of withdrawal from suboxone and pain s/p CTS release on the right.  See previous notes.  She has done very well over the last week with OxyContin 60 mg BID, only needing one rescue dose with oxycodone.  No diarrhea.  Mood is stable.  Pain is controlled.  Ready to reduce the dose to 55 mg BID as planned.  Review of Systems  As above.  Past Medical History  Diagnosis Date  . Anxiety   . Depression   . GERD (gastroesophageal reflux disease)   . Esophageal stricture   . Hyperlipemia   . Hypertension   . History of alcohol abuse   . Migraine headache   . History of cocaine abuse   . Bipolar disorder   . Pancreatitis, acute   . Obesity     Past Surgical History  Procedure Date  . Appendectomy   . Cholecystectomy   . Abdominal hysterectomy   . Cesarean section   . Knee surgery 2010    leftx3  . Nasal septum surgery   . Carpal tunnel release 07/29/2012    Procedure: CARPAL TUNNEL RELEASE;  Surgeon: Eldred Manges, MD;  Location: [facility];  Service: Orthopedics;  Laterality: Right;  Right carpal tunnel release    Prior to Admission medications   Medication Sig Start Date End Date Taking? Authorizing Provider  ALPRAZolam Prudy Feeler) 1 MG tablet Take 1 mg by mouth 3 (three) times daily as needed. For anxiety   Yes Historical Provider, MD  amLODipine (NORVASC) 10 MG tablet Take 1 tablet (10 mg total) by mouth daily. 08/02/12  Yes Caiya Bettes S Gearlene Godsil, PA-C  amphetamine-dextroamphetamine (ADDERALL) 15 MG tablet Take 1 tablet (15 mg total) by mouth 3 (three) times daily. 07/12/12  Yes Heather M Marte, PA-C  betamethasone dipropionate (DIPROLENE) 0.05 % ointment Apply 1 application topically 2 (two) times daily as needed. For rashes on hands.   Yes Historical Provider, MD  cloNIDine (CATAPRES) 0.2 MG tablet Take 0.5 tablets (0.1 mg total) by  mouth 3 (three) times daily as needed (withdrawl symptoms). 07/25/12  Yes Toy Baker, MD  EPINEPHrine (EPIPEN) 0.3 mg/0.3 mL DEVI Inject 0.3 mLs (0.3 mg total) into the muscle as needed (inject at time of signs of anaphylaxis). 06/04/12  Yes Tatyana A Kirichenko, PA  LORazepam (ATIVAN) 1 MG tablet TAKE 2 TABLETS BY MOUTH EVERY 6 HOURS AS NEEDED FOR ANXIETY 08/08/12  Yes Ryan M Dunn, PA-C  oxyCODONE (OXY IR/ROXICODONE) 5 MG immediate release tablet Take 1 tablet (5 mg total) by mouth every 4 (four) hours as needed for pain. 08/04/12  Yes Phillips Odor, MD  Oxycodone HCl 60 MG TB12 Take 1 tablet (60 mg total) by mouth every 12 (twelve) hours. 08/06/12  Yes Pedram Goodchild S Amol Domanski, PA-C  pantoprazole (PROTONIX) 40 MG tablet Take 40 mg by mouth daily.   Yes Historical Provider, MD  sertraline (ZOLOFT) 100 MG tablet Take 100 mg by mouth daily.   Yes Historical Provider, MD  triamcinolone (KENALOG) 0.1 % paste Take 1 application by mouth 2 (two) times daily as needed. Applied to tongue sores as needed   Yes Historical Provider, MD    Allergies  Allergen Reactions  . Imitrex (Sumatriptan Base) Shortness Of Breath  . Metoclopramide Hcl Itching  . Morphine And Related Itching  . Tegretol (Carbamazepine) Itching  .  Topamax Itching    History   Social History  . Marital Status: Significant Other    Spouse Name: Dustin Flock    Number of Children: 2  . Years of Education: N/A   Occupational History  . CNA    Social History Main Topics  . Smoking status: Current Some Day Smoker -- 1.0 packs/day for 20 years    Types: Cigarettes  . Smokeless tobacco: Never Used  . Alcohol Use: No     10 yr  . Drug Use: Yes    Special: Cocaine     not in 2 yr  . Sexually Active: Yes -- Female partner(s)   Other Topics Concern  . Not on file   Social History Narrative   3 caffeine drinks daily Student in substance abuse counseling program.Has been in Suboxone clinic since 2011 to get off narcotics.     Family History  Problem Relation Age of Onset  . Colon cancer Neg Hx   . Heart disease Maternal Grandmother   . Kidney cancer Mother   . Hypertension Mother   . Cancer Mother   . Lung cancer Maternal Aunt        Objective:   Physical Exam Blood pressure 142/94, pulse 112, temperature 99.5 F (37.5 C), temperature source Oral, resp. rate 18, height 5\' 6"  (1.676 m), weight 181 lb 9.6 oz (82.373 kg), SpO2 98.00%. Body mass index is 29.31 kg/(m^2). Well-developed, well nourished WF who is awake, alert and oriented, in NAD. HEENT: [place]/AT, sclera and conjunctiva are clear.   Neck: supple, non-tender, no lymphadenopathy, thyromegaly. Heart: RRR, no murmur Lungs: normal effort, CTA Skin: warm and dry without rash. Scar on the volar wrist is healing well. Psychologic: good mood and appropriate affect, a little nervous, normal speech and behavior.     Assessment & Plan:   1. Pain  oxyCODONE (OXYCONTIN) 15 MG TB12, oxyCODONE (OXYCONTIN) 40 MG 12 hr tablet, combined for a dose of 55 mg.  2. ADD (attention deficit disorder)  amphetamine-dextroamphetamine (ADDERALL) 30 MG tablet  3. Anxiety  ALPRAZolam (XANAX) 1 MG tablet   RTC one week, sooner if needed.

## 2012-08-15 NOTE — Telephone Encounter (Signed)
[facility] pharmacy LM on nurse VM f/up on the Rx. I called pharm back to have them check on last Rx written by [name] to see if she normally writes for #90. Pharmacist looked back several months and said the only Rx for Xanax they see were written by Berneta Levins for #90/mos, and pt was able to p/up a RF on 08/13/12 that was still left on his last Rx. Pharm will just put [name]'s Rx on hold for pt until due. [name], I wasn't sure you were aware of pt's other prescriber and please verify # you want on Rx.

## 2012-08-16 NOTE — Telephone Encounter (Signed)
1. Please call [facility] and CANCEL the #30 Rx from me 08/13/2012.  Yes, it should have been #90, because I thought I was taking over the prescribing of this product, since the patient told me she was not going back to the Suboxone clinic.  2. Please call the patient and advise her of the above.  Let her know that it's not ok to fill someone else's Rx for this medication (it was written by Dr. Manson Passey at the Suboxone clinic on 06/18/2012 and looks like he wrote her thre Rx's, one to fill each month x 3 months) if she wants me to write it.  The [place] Controlled Substance Database reviewed. I suspect this was an oversight, that she didn't realize that she had another Rx from Dr. Manson Passey when she asked me to write for Xanax on 08/13/2012.  If that is the case, I will take on writing the Rx next month.

## 2012-08-17 NOTE — Telephone Encounter (Signed)
[facility] pharmacy closed, will call on Monday if patient has not gotten the Rx will cancel (did leave message for [facility] pharmacy to call back). Left message for patient to call me back.

## 2012-08-19 NOTE — Telephone Encounter (Signed)
Rx cancelled from 08/13/12 have left message for patient to call back about this.

## 2012-08-20 ENCOUNTER — Ambulatory Visit (INDEPENDENT_AMBULATORY_CARE_PROVIDER_SITE_OTHER): Payer: 59 | Admitting: Physician Assistant

## 2012-08-20 VITALS — BP 132/88 | HR 98 | Temp 98.2°F | Resp 16 | Ht 66.0 in | Wt 182.0 lb

## 2012-08-20 DIAGNOSIS — R52 Pain, unspecified: Secondary | ICD-10-CM

## 2012-08-20 MED ORDER — OXYCODONE HCL 10 MG PO TB12: 10.0000 mg | ORAL_TABLET | Freq: Two times a day (BID) | ORAL | Status: DC

## 2012-08-20 NOTE — Telephone Encounter (Signed)
Patient here now to see [name], she will discuss with patient

## 2012-08-20 NOTE — Progress Notes (Signed)
Subjective:    Patient ID: [name], female    DOB: [birth year], 41 y.o.   MRN: [id number]  HPI This 41 y.o. female presents for evaluation of chronic pain and withdrawal.  See previous notes.  She has tolerated the reduction in dose of OxyContin from 60 mg to 55 mg without diarrhea, considerable pain requiring breakthrough relief.  She finds that Ativan helps more than alprazolam when she's having nausea.  Her wrist pain is improving.  She is ready to reduce the dose to 50 mg as planned.  She has the supply of 40 mg tabs for the next week.  Review of Systems  As above.  Past Medical History  Diagnosis Date  . Anxiety   . Depression   . GERD (gastroesophageal reflux disease)   . Esophageal stricture   . Hyperlipemia   . Hypertension   . History of alcohol abuse   . Migraine headache   . History of cocaine abuse   . Bipolar disorder   . Pancreatitis, acute   . Obesity     Past Surgical History  Procedure Date  . Appendectomy   . Cholecystectomy   . Abdominal hysterectomy   . Cesarean section   . Knee surgery 2010    leftx3  . Nasal septum surgery   . Carpal tunnel release 07/29/2012    Procedure: CARPAL TUNNEL RELEASE;  Surgeon: Eldred Manges, MD;  Location: [facility];  Service: Orthopedics;  Laterality: Right;  Right carpal tunnel release    Prior to Admission medications   Medication Sig Start Date End Date Taking? Authorizing Provider  amLODipine (NORVASC) 10 MG tablet Take 1 tablet (10 mg total) by mouth daily. 08/02/12  Yes Trayshawn Durkin S Mostafa Yuan, PA-C  amphetamine-dextroamphetamine (ADDERALL) 30 MG tablet Take 0.5 tablets (15 mg total) by mouth 3 (three) times daily. 08/13/12  Yes Keddrick Wyne S Kamilah Correia, PA-C  cloNIDine (CATAPRES) 0.2 MG tablet Take 0.5 tablets (0.1 mg total) by mouth 3 (three) times daily as needed (withdrawl symptoms). 07/25/12  Yes Toy Baker, MD  EPINEPHrine (EPIPEN) 0.3 mg/0.3 mL DEVI Inject 0.3 mLs (0.3 mg total) into the muscle as needed  (inject at time of signs of anaphylaxis). 06/04/12  Yes Tatyana A Kirichenko, PA  LORazepam (ATIVAN) 1 MG tablet TAKE 2 TABLETS BY MOUTH EVERY 6 HOURS AS NEEDED FOR ANXIETY 08/08/12  Yes Ryan M Dunn, PA-C  oxyCODONE (OXY IR/ROXICODONE) 5 MG immediate release tablet Take 1 tablet (5 mg total) by mouth every 4 (four) hours as needed for pain. 08/04/12  Yes Phillips Odor, MD  oxyCODONE (OXYCONTIN) 15 MG TB12 Take 1 tablet (15 mg total) by mouth every 12 (twelve) hours. Take with 40 mg to achieve 55 mg 08/13/12  Yes Allyna Pittsley S Duane Earnshaw, PA-C  oxyCODONE (OXYCONTIN) 40 MG 12 hr tablet Take 1 tablet (40 mg total) by mouth every 12 (twelve) hours. 08/13/12  Yes Mikolaj Woolstenhulme S Zuriel Yeaman, PA-C  pantoprazole (PROTONIX) 40 MG tablet Take 40 mg by mouth daily.   Yes Historical Provider, MD  sertraline (ZOLOFT) 100 MG tablet Take 100 mg by mouth daily.   Yes Historical Provider, MD  ALPRAZolam Prudy Feeler) 1 MG tablet Take 1 tablet (1 mg total) by mouth 3 (three) times daily as needed. For anxiety 08/13/12   Jovonta Levit S Randall Rampersad, PA-C  betamethasone dipropionate (DIPROLENE) 0.05 % ointment Apply 1 application topically 2 (two) times daily as needed. For rashes on hands.    Historical Provider, MD    Allergies  Allergen Reactions  . Imitrex (Sumatriptan Base) Shortness Of Breath  . Metoclopramide Hcl Itching  . Morphine And Related Itching  . Tegretol (Carbamazepine) Itching  . Topamax Itching    History   Social History  . Marital Status: Significant Other    Spouse Name: Dustin Flock    Number of Children: 2  . Years of Education: N/A   Occupational History  . CNA    Social History Main Topics  . Smoking status: Current Some Day Smoker -- 1.0 packs/day for 20 years    Types: Cigarettes  . Smokeless tobacco: Never Used  . Alcohol Use: No     10 yr  . Drug Use: Yes    Special: Cocaine     not in 2 yr  . Sexually Active: Yes -- Female partner(s)   Other Topics Concern  . Not on file   Social History  Narrative   3 caffeine drinks daily Student in substance abuse counseling program.Has been in Suboxone clinic since 2011 to get off narcotics.    Family History  Problem Relation Age of Onset  . Colon cancer Neg Hx   . Heart disease Maternal Grandmother   . Kidney cancer Mother   . Hypertension Mother   . Cancer Mother   . Lung cancer Maternal Aunt        Objective:   Physical Exam  Blood pressure 132/88, pulse 98, temperature 98.2 F (36.8 C), resp. rate 16, height 5\' 6"  (1.676 m), weight 182 lb (82.555 kg), SpO2 98.00%. Body mass index is 29.38 kg/(m^2). Well-developed, well nourished WF who is awake, alert and oriented, in NAD. HEENT: [place]/AT, PERRL, EOMI.  Sclera and conjunctiva are clear.   Neck: supple, non-tender, no lymphadenopathy, thyromegaly. Heart: RRR, no murmur Lungs: normal effort, CTA Extremities: no cyanosis, clubbing or edema.  Skin: warm and dry without rash.  Well-healed surgical scar on the volar RIGHT wrist. Psychologic: good mood and appropriate affect, normal speech and behavior.    Assessment & Plan:   1. Pain  oxyCODONE (OXYCONTIN) 10 MG 12 hr tablet   Continue the 40 mg tabs, and add 10 mg tabs (#30 given.  She'll use #14 over the next 7 days, and reserve the remaining tabs for week #8 of the planned taper) RTC 1 week.

## 2012-08-22 ENCOUNTER — Ambulatory Visit (HOSPITAL_BASED_OUTPATIENT_CLINIC_OR_DEPARTMENT_OTHER): Admission: RE | Admit: 2012-08-22 | Payer: 59 | Source: Ambulatory Visit | Admitting: Orthopedic Surgery

## 2012-08-22 ENCOUNTER — Encounter (HOSPITAL_BASED_OUTPATIENT_CLINIC_OR_DEPARTMENT_OTHER): Admission: RE | Payer: Self-pay | Source: Ambulatory Visit

## 2012-08-22 SURGERY — CARPAL TUNNEL RELEASE
Anesthesia: Monitor Anesthesia Care | Laterality: Right

## 2012-08-26 ENCOUNTER — Ambulatory Visit (INDEPENDENT_AMBULATORY_CARE_PROVIDER_SITE_OTHER): Payer: 59 | Admitting: Physician Assistant

## 2012-08-26 VITALS — BP 118/82 | HR 112 | Temp 98.8°F | Resp 18

## 2012-08-26 DIAGNOSIS — R52 Pain, unspecified: Secondary | ICD-10-CM

## 2012-08-26 DIAGNOSIS — F19939 Other psychoactive substance use, unspecified with withdrawal, unspecified: Secondary | ICD-10-CM

## 2012-08-26 MED ORDER — OXYCODONE HCL ER 15 MG PO T12A: 45.0000 mg | EXTENDED_RELEASE_TABLET | Freq: Two times a day (BID) | ORAL | Status: DC

## 2012-08-26 NOTE — Patient Instructions (Addendum)
Take the 15 mg tablets, 3 of them, twice daily.  You will have 18 left to save for week 8 (along with the leftover 10 mg tabs you have from last week).      8-7 8-7  11-7    Friday Sa Su

## 2012-08-26 NOTE — Progress Notes (Signed)
Subjective:    Patient ID: [name], female    DOB: 03-07-1971, 41 y.o.   MRN: [id number]  HPI  Presents for weekly follow up of chronic pain and withdrawal from Suboxone.  Seen previous notes.  This is week four of planned 12 weeks, decreasing long-acting oxycodone dose by 5 mg each week.  She Started at 60 mg BID, and has tolerated the lower dose each week.  Has had some hot/cold flashes.  Mild nausea initially, none last week, and no loose stools.  She has not had paresthesias (sensation of skin crawling) that she had previously with withdrawal.  Her wife is with her today and is supportive.  Review of Systems As above.   Past Medical History  Diagnosis Date  . Anxiety   . Depression   . GERD (gastroesophageal reflux disease)   . Esophageal stricture   . Hyperlipemia   . Hypertension   . History of alcohol abuse   . Migraine headache   . History of cocaine abuse   . Bipolar disorder   . Pancreatitis, acute   . Obesity     Past Surgical History  Procedure Date  . Appendectomy   . Cholecystectomy   . Abdominal hysterectomy   . Cesarean section   . Knee surgery 2010    leftx3  . Nasal septum surgery   . Carpal tunnel release 07/29/2012    Procedure: CARPAL TUNNEL RELEASE;  Surgeon: Eldred Manges, MD;  Location: [facility];  Service: Orthopedics;  Laterality: Right;  Right carpal tunnel release    Prior to Admission medications   Medication Sig Start Date End Date Taking? Authorizing Provider  ALPRAZolam Prudy Feeler) 1 MG tablet Take 1 tablet (1 mg total) by mouth 3 (three) times daily as needed. For anxiety 08/13/12  Yes Ab Leaming S Huxton Glaus, PA-C  amLODipine (NORVASC) 10 MG tablet Take 1 tablet (10 mg total) by mouth daily. 08/02/12  Yes Ralph Brouwer S Brinkley Peet, PA-C  amphetamine-dextroamphetamine (ADDERALL) 30 MG tablet Take 0.5 tablets (15 mg total) by mouth 3 (three) times daily. 08/13/12  Yes [place] Monds S Chaney Ingram, PA-C  cloNIDine (CATAPRES) 0.2 MG tablet Take 0.5 tablets  (0.1 mg total) by mouth 3 (three) times daily as needed (withdrawl symptoms). 07/25/12  Yes Toy Baker, MD  EPINEPHrine (EPIPEN) 0.3 mg/0.3 mL DEVI Inject 0.3 mLs (0.3 mg total) into the muscle as needed (inject at time of signs of anaphylaxis). 06/04/12  Yes Tatyana A Kirichenko, PA  LORazepam (ATIVAN) 1 MG tablet TAKE 2 TABLETS BY MOUTH EVERY 6 HOURS AS NEEDED FOR ANXIETY 08/08/12  Yes Ryan M Dunn, PA-C  oxyCODONE (OXYCONTIN) 10 MG 12 hr tablet Take 1 tablet (10 mg total) by mouth every 12 (twelve) hours. Take with 40 mg to achieve 50 mg dose 08/20/12  Yes Shatana Saxton S Paije Goodhart, PA-C  oxyCODONE (OXYCONTIN) 15 MG TB12 Take 1 tablet (15 mg total) by mouth every 12 (twelve) hours. Take with 40 mg to achieve 55 mg 08/13/12  Yes Nathanyl Andujo S Kenetra Hildenbrand, PA-C  oxyCODONE (OXYCONTIN) 40 MG 12 hr tablet Take 1 tablet (40 mg total) by mouth every 12 (twelve) hours. 08/13/12  Yes Verne Cove S Rossanna Spitzley, PA-C  pantoprazole (PROTONIX) 40 MG tablet Take 40 mg by mouth daily.   Yes Historical Provider, MD  sertraline (ZOLOFT) 100 MG tablet Take 100 mg by mouth daily.   Yes Historical Provider, MD  betamethasone dipropionate (DIPROLENE) 0.05 % ointment Apply 1 application topically 2 (two) times daily as needed. For  rashes on hands.    Historical Provider, MD  oxyCODONE (OXY IR/ROXICODONE) 5 MG immediate release tablet Take 1 tablet (5 mg total) by mouth every 4 (four) hours as needed for pain. 08/04/12   Phillips Odor, MD    Allergies  Allergen Reactions  . Imitrex (Sumatriptan Base) Shortness Of Breath  . Metoclopramide Hcl Itching  . Morphine And Related Itching  . Tegretol (Carbamazepine) Itching  . Topamax Itching    History   Social History  . Marital Status: Significant Other    Spouse Name: Dustin Flock    Number of Children: 2  . Years of Education: N/A   Occupational History  . CNA    Social History Main Topics  . Smoking status: Current Some Day Smoker -- 1.0 packs/day for 20 years    Types:  Cigarettes  . Smokeless tobacco: Never Used  . Alcohol Use: No     10 yr  . Drug Use: Yes    Special: Cocaine     not in 2 yr  . Sexually Active: Yes -- Female partner(s)   Other Topics Concern  . Not on file   Social History Narrative   3 caffeine drinks daily Student in substance abuse counseling program.Has been in Suboxone clinic since 2011 to get off narcotics.    Family History  Problem Relation Age of Onset  . Colon cancer Neg Hx   . Heart disease Maternal Grandmother   . Kidney cancer Mother   . Hypertension Mother   . Cancer Mother   . Lung cancer Maternal Aunt        Objective:   Physical Exam  Blood pressure 118/82, pulse 112, temperature 98.8 F (37.1 C), temperature source Oral, resp. rate 18, SpO2 99.00%. There is no height or weight on file to calculate BMI. Well-developed, well nourished WF who is awake, alert and oriented, in NAD. HEENT: [place]/AT, sclera and conjunctiva are clear.   Neck: supple, non-tender, no lymphadenopathy, thyromegaly. Heart: RRR, no murmur Lungs: normal effort, CTA Extremities: no cyanosis, clubbing or edema. Skin: warm and dry without rash. Psychologic: good mood and appropriate affect, normal speech and behavior.    Assessment & Plan:   1. Pain  OxyCODONE (OXYCONTIN) 15 mg TB12  2. Withdrawal syndrome  OxyCODONE (OXYCONTIN) 15 mg TB12   She'll take 45 mg BID for the next week.  She will use the tablets remaining (#18), along with the remaining 10 mg tabs from last week, in week 8 (when she will take 25 mg BID).

## 2012-09-03 ENCOUNTER — Ambulatory Visit (INDEPENDENT_AMBULATORY_CARE_PROVIDER_SITE_OTHER): Payer: 59 | Admitting: Physician Assistant

## 2012-09-03 VITALS — BP 120/89 | HR 89 | Temp 97.8°F | Resp 18 | Wt 179.0 lb

## 2012-09-03 DIAGNOSIS — F19939 Other psychoactive substance use, unspecified with withdrawal, unspecified: Secondary | ICD-10-CM

## 2012-09-03 DIAGNOSIS — G8929 Other chronic pain: Secondary | ICD-10-CM

## 2012-09-03 DIAGNOSIS — R52 Pain, unspecified: Secondary | ICD-10-CM

## 2012-09-03 MED ORDER — LORAZEPAM 1 MG PO TABS: 1.0000 mg | ORAL_TABLET | Freq: Three times a day (TID) | ORAL | Status: DC | PRN

## 2012-09-03 MED ORDER — OXYCODONE HCL 40 MG PO TB12: 40.0000 mg | ORAL_TABLET | Freq: Two times a day (BID) | ORAL | Status: DC

## 2012-09-03 MED ORDER — OXYCODONE HCL 5 MG PO TABS: 5.0000 mg | ORAL_TABLET | ORAL | Status: DC | PRN

## 2012-09-03 MED ORDER — OXYCODONE HCL ER 10 MG PO T12A: 10.0000 mg | EXTENDED_RELEASE_TABLET | Freq: Two times a day (BID) | ORAL | Status: DC

## 2012-09-03 NOTE — Progress Notes (Signed)
Subjective:    Patient ID: [name], female    DOB: 03/16/1971, 41 y.o.   MRN: [id number]  HPI This 41 y.o. female presents for evaluation of chronic pain and medication withdrawal.  She did not tolerate the drop from 50 mg to 40 mg, so has been using the extra pills (meant for use during week 6 and week 8 of the taper) to supplement.  On 40 mg she had increased nausea, vomiting, diarrhea and irritability.  She's rethinking the need for IR rescue and worried that needing to increase the dose again makes her a "dirty drug addict."  She has the support of her wife, and two daughters, who are keeping her medications and dispensing them.  She has not used her Adderall for fear that it will make this harder.    Her older daughter, Judeth Cornfield, is present today. Review of Systems  As above.  Past Medical History  Diagnosis Date  . Anxiety   . Depression   . GERD (gastroesophageal reflux disease)   . Esophageal stricture   . Hyperlipemia   . Hypertension   . History of alcohol abuse   . Migraine headache   . History of cocaine abuse   . Bipolar disorder   . Pancreatitis, acute   . Obesity     Past Surgical History  Procedure Date  . Appendectomy   . Cholecystectomy   . Abdominal hysterectomy   . Cesarean section   . Knee surgery 2010    leftx3  . Nasal septum surgery   . Carpal tunnel release 07/29/2012    Procedure: CARPAL TUNNEL RELEASE;  Surgeon: Eldred Manges, MD;  Location: [facility];  Service: Orthopedics;  Laterality: Right;  Right carpal tunnel release    Prior to Admission medications   Medication Sig Start Date End Date Taking? Authorizing Provider  amLODipine (NORVASC) 10 MG tablet Take 1 tablet (10 mg total) by mouth daily. 08/02/12  Yes Erubiel Manasco S Latondra Gebhart, PA-C  betamethasone dipropionate (DIPROLENE) 0.05 % ointment Apply 1 application topically 2 (two) times daily as needed. For rashes on hands.   Yes Historical Provider, MD  cloNIDine (CATAPRES) 0.2 MG  tablet Take 0.5 tablets (0.1 mg total) by mouth 3 (three) times daily as needed (withdrawl symptoms). 07/25/12  Yes Toy Baker, MD  EPINEPHrine (EPIPEN) 0.3 mg/0.3 mL DEVI Inject 0.3 mLs (0.3 mg total) into the muscle as needed (inject at time of signs of anaphylaxis). 06/04/12  Yes Tatyana A Kirichenko, PA  LORazepam (ATIVAN) 1 MG tablet TAKE 2 TABLETS BY MOUTH EVERY 6 HOURS AS NEEDED FOR ANXIETY 08/08/12  Yes Ryan M Dunn, PA-C  oxyCODONE (OXYCONTIN) 15 MG TB12 Take 1 tablet (15 mg total) by mouth every 12 (twelve) hours. Take with 40 mg to achieve 55 mg 08/13/12  Yes Kitiara Hintze S Tawnia Schirm, PA-C  OxyCODONE (OXYCONTIN) 15 mg TB12 Take 3 tablets (45 mg total) by mouth every 12 (twelve) hours. 08/26/12  Yes Joliene Salvador S Hershal Eriksson, PA-C  oxyCODONE (OXYCONTIN) 40 MG 12 hr tablet Take 1 tablet (40 mg total) by mouth every 12 (twelve) hours. 08/13/12  Yes Madinah Quarry S Shuntel Fishburn, PA-C  pantoprazole (PROTONIX) 40 MG tablet Take 40 mg by mouth daily.   Yes Historical Provider, MD  sertraline (ZOLOFT) 100 MG tablet Take 100 mg by mouth daily.   Yes Historical Provider, MD  ALPRAZolam Prudy Feeler) 1 MG tablet Take 1 tablet (1 mg total) by mouth 3 (three) times daily as needed. For anxiety 08/13/12  Rollie Hynek S Zailey Audia, PA-C  amphetamine-dextroamphetamine (ADDERALL) 30 MG tablet Take 0.5 tablets (15 mg total) by mouth 3 (three) times daily. 08/13/12   Caeleigh Prohaska Tessa Lerner, PA-C  oxyCODONE (OXY IR/ROXICODONE) 5 MG immediate release tablet Take 1 tablet (5 mg total) by mouth every 4 (four) hours as needed for pain. 08/04/12   Phillips Odor, MD    Allergies  Allergen Reactions  . Imitrex (Sumatriptan Base) Shortness Of Breath  . Metoclopramide Hcl Itching  . Morphine And Related Itching  . Tegretol (Carbamazepine) Itching  . Topamax Itching    History   Social History  . Marital Status: Significant Other    Spouse Name: Dustin Flock    Number of Children: 2  . Years of Education: N/A   Occupational History  . CNA    Social  History Main Topics  . Smoking status: Current Some Day Smoker -- 1.0 packs/day for 20 years    Types: Cigarettes  . Smokeless tobacco: Never Used  . Alcohol Use: No     10 yr  . Drug Use: Yes    Special: Cocaine     not in 2 yr  . Sexually Active: Yes -- Female partner(s)   Other Topics Concern  . Not on file   Social History Narrative   3 caffeine drinks daily Student in substance abuse counseling program.Has been in Suboxone clinic since 2011 to get off narcotics.    Family History  Problem Relation Age of Onset  . Colon cancer Neg Hx   . Heart disease Maternal Grandmother   . Kidney cancer Mother   . Hypertension Mother   . Cancer Mother   . Lung cancer Maternal Aunt        Objective:   Physical Exam Blood pressure 120/89, pulse 89, temperature 97.8 F (36.6 C), temperature source Oral, resp. rate 18, weight 179 lb (81.194 kg). There is no height on file to calculate BMI. Well-developed, well nourished WF who is awake, alert and oriented, in NAD. HEENT: [place]/AT, sclera and conjunctiva are clear.   Lungs: normal effort Extremities: no cyanosis, clubbing or edema. Skin: warm and dry without rash. Psychologic: good mood and appropriate affect, normal speech and behavior.     Assessment & Plan:   1. Chronic pain  oxyCODONE (OXYCONTIN) 40 MG 12 hr tablet, oxyCODONE (OXY IR/ROXICODONE) 5 MG immediate release tablet, OxyCODONE (OXYCONTIN) 10 mg TB12  2. Withdrawal syndrome  oxyCODONE (OXYCONTIN) 40 MG 12 hr tablet, oxyCODONE (OXY IR/ROXICODONE) 5 MG immediate release tablet, OxyCODONE (OXYCONTIN) 10 mg TB12, LORazepam (ATIVAN) 1 MG tablet  3. Pain  oxyCODONE (OXYCONTIN) 40 MG 12 hr tablet   Go back to 50 mg (40+10) BID until her withdrawal symptoms resolve.  Then resume 45 mg (15+15+15) BID.  She only has a few 15 mg tabs left, so she'll plan to RTC when she reduces the dose (has enough to stay on 50 mg x 15 days).  I've also provided 5 mg IR for breakthrough pain, but  anticipate that having these available will reduce her anxiety and she won't need it often.  She'll continue to use Ativan prn, as it helps with the nausea better than alprazolam.  When she returns, she will bring the counts of the remaining pills she has of each dose.

## 2012-09-03 NOTE — Patient Instructions (Addendum)
Go back to the 50 mg twice daily (40+10) until your withdrawal symptoms resolve.  Then, drop down to 45 mg (15+15+15)  twice daily again.  Plan to come back in when you go back to 45 mg.  Wednesday 11/06 1p-8p 11/07 7:30a-1p* 11/13 7:30a-3:30p 11/14 6p-8p 11/15 11a-6p

## 2012-09-12 ENCOUNTER — Ambulatory Visit (INDEPENDENT_AMBULATORY_CARE_PROVIDER_SITE_OTHER): Payer: 59 | Admitting: Physician Assistant

## 2012-09-12 VITALS — BP 122/84 | HR 114 | Temp 98.5°F | Resp 16 | Ht 66.0 in | Wt 181.2 lb

## 2012-09-12 DIAGNOSIS — R52 Pain, unspecified: Secondary | ICD-10-CM

## 2012-09-12 DIAGNOSIS — F19939 Other psychoactive substance use, unspecified with withdrawal, unspecified: Secondary | ICD-10-CM

## 2012-09-12 MED ORDER — OXYCODONE HCL ER 15 MG PO T12A: 45.0000 mg | EXTENDED_RELEASE_TABLET | Freq: Two times a day (BID) | ORAL | Status: DC
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
## 2012-09-20 ENCOUNTER — Ambulatory Visit (INDEPENDENT_AMBULATORY_CARE_PROVIDER_SITE_OTHER): Payer: 59 | Admitting: Physician Assistant

## 2012-09-20 VITALS — BP 128/76 | HR 115 | Temp 97.5°F | Resp 17 | Ht 67.0 in | Wt 185.0 lb

## 2012-09-20 DIAGNOSIS — F19939 Other psychoactive substance use, unspecified with withdrawal, unspecified: Secondary | ICD-10-CM

## 2012-09-20 DIAGNOSIS — R51 Headache: Secondary | ICD-10-CM

## 2012-09-20 DIAGNOSIS — R52 Pain, unspecified: Secondary | ICD-10-CM

## 2012-09-20 DIAGNOSIS — G8929 Other chronic pain: Secondary | ICD-10-CM

## 2012-09-20 DIAGNOSIS — R519 Headache, unspecified: Secondary | ICD-10-CM

## 2012-09-20 MED ORDER — OXYCODONE HCL 40 MG PO TB12: 40.0000 mg | ORAL_TABLET | Freq: Two times a day (BID) | ORAL | Status: DC

## 2012-09-20 MED ORDER — OXYCODONE HCL 5 MG PO TABS: 5.0000 mg | ORAL_TABLET | ORAL | Status: DC | PRN

## 2012-09-20 MED ORDER — LORAZEPAM 1 MG PO TABS: 1.0000 mg | ORAL_TABLET | Freq: Three times a day (TID) | ORAL | Status: DC | PRN

## 2012-09-20 MED ORDER — IPRATROPIUM BROMIDE 0.03 % NA SOLN: 2.0000 | Freq: Two times a day (BID) | NASAL | Status: DC

## 2012-09-20 MED ORDER — GUAIFENESIN ER 1200 MG PO TB12: 1.0000 | ORAL_TABLET | Freq: Two times a day (BID) | ORAL | Status: DC | PRN

## 2012-09-20 MED ORDER — AMOXICILLIN 875 MG PO TABS: 1750.0000 mg | ORAL_TABLET | Freq: Two times a day (BID) | ORAL | Status: DC

## 2012-09-20 NOTE — Patient Instructions (Signed)
Sunday 11/24 8a-6p Tuesday 11/26 1p-8p Wednesday 11/27 1p-8p Monday 12/02 7:30a-3:30p

## 2012-09-20 NOTE — Progress Notes (Signed)
Subjective:    Patient ID: [name], female    DOB: 17-May-1971, 41 y.o.   MRN: [id number]  HPI  This 41 y.o. female presents for evaluation of withdrawal symptoms and chronic pain as we reduce her dose of narcotic pain medication.  She's having severe HA daily, in the afternoons and evenings.  Different from her migraine, no unilateral.  Has to lie down.  Wonders if she's developing a sinus thing. Mild sinus congestion.  Had a sore throat and swollen RIGHT tonsil several days ago, now resolved.  No cough.  All over her head, some nausea.  Robaxin, Benadryl, ibuprofen, aspirin, acetaminophen, oxycodone IR without benefit.  No feeling stressed about anything at this point.  Taking 45 mg of OxyContin Q12 hours.  Not as comfortable as she'd like-having hot flashes/chills and diarrhea.  Thinks she'll be ready to go down to 40 mg early next week.   Review of Systems As above. No CP, SOB, dizziness.   Past Medical History  Diagnosis Date  . Anxiety   . Depression   . GERD (gastroesophageal reflux disease)   . Esophageal stricture   . Hyperlipemia   . Hypertension   . History of alcohol abuse   . Migraine headache   . History of cocaine abuse   . Bipolar disorder   . Pancreatitis, acute   . Obesity     Past Surgical History  Procedure Date  . Appendectomy   . Cholecystectomy   . Abdominal hysterectomy   . Cesarean section   . Knee surgery 2010    leftx3  . Nasal septum surgery   . Carpal tunnel release 07/29/2012    Procedure: CARPAL TUNNEL RELEASE;  Surgeon: Eldred Manges, MD;  Location: [facility];  Service: Orthopedics;  Laterality: Right;  Right carpal tunnel release    Prior to Admission medications   Medication Sig Start Date End Date Taking? Authorizing Provider  ALPRAZolam Prudy Feeler) 1 MG tablet Take 1 tablet (1 mg total) by mouth 3 (three) times daily as needed. For anxiety 08/13/12  Yes Rhona Fusilier S Shawnie Nicole, PA-C  amLODipine (NORVASC) 10 MG tablet Take 1  tablet (10 mg total) by mouth daily. 08/02/12  Yes Brinley Rosete S Junior Huezo, PA-C  amphetamine-dextroamphetamine (ADDERALL) 30 MG tablet Take 0.5 tablets (15 mg total) by mouth 3 (three) times daily. 08/13/12  Yes Aela Bohan S Khadeeja Elden, PA-C  cloNIDine (CATAPRES) 0.2 MG tablet Take 0.5 tablets (0.1 mg total) by mouth 3 (three) times daily as needed (withdrawl symptoms). 07/25/12  Yes Toy Baker, MD  EPINEPHrine (EPIPEN) 0.3 mg/0.3 mL DEVI Inject 0.3 mLs (0.3 mg total) into the muscle as needed (inject at time of signs of anaphylaxis). 06/04/12  Yes Tatyana A Kirichenko, PA  LORazepam (ATIVAN) 1 MG tablet Take 1-2 tablets (1-2 mg total) by mouth every 8 (eight) hours as needed for anxiety. 09/03/12  Yes Naydelin Ziegler S Preet Mangano, PA-C  oxyCODONE (OXY IR/ROXICODONE) 5 MG immediate release tablet Take 1 tablet (5 mg total) by mouth every 4 (four) hours as needed for pain ((breakthrough)). 09/03/12  Yes Clemence Stillings S Gannon Heinzman, PA-C  OxyCODONE (OXYCONTIN) 15 mg TB12 Take 3 tablets (45 mg total) by mouth every 12 (twelve) hours. 09/12/12  Yes Baron Parmelee S Monique Gift, PA-C  pantoprazole (PROTONIX) 40 MG tablet Take 40 mg by mouth daily.   Yes Historical Provider, MD  sertraline (ZOLOFT) 100 MG tablet Take 100 mg by mouth daily.   Yes Historical Provider, MD  betamethasone dipropionate (DIPROLENE) 0.05 % ointment  Apply 1 application topically 2 (two) times daily as needed. For rashes on hands.    Historical Provider, MD    Allergies  Allergen Reactions  . Imitrex (Sumatriptan Base) Shortness Of Breath  . Metoclopramide Hcl Itching  . Morphine And Related Itching  . Tegretol (Carbamazepine) Itching  . Topamax Itching    History   Social History  . Marital Status: Significant Other    Spouse Name: Dustin Flock    Number of Children: 2  . Years of Education: N/A   Occupational History  . CNA    Social History Main Topics  . Smoking status: Current Some Day Smoker -- 1.0 packs/day for 20 years    Types: Cigarettes  .  Smokeless tobacco: Never Used  . Alcohol Use: No     Comment: 10 yr  . Drug Use: No     Comment: none since 2011  . Sexually Active: Yes -- Female partner(s)   Other Topics Concern  . Not on file   Social History Narrative   3 caffeine drinks daily Student in substance abuse counseling program.Has been in Suboxone clinic since 2011 to get off narcotics.    Family History  Problem Relation Age of Onset  . Colon cancer Neg Hx   . Heart disease Maternal Grandmother   . Kidney cancer Mother   . Hypertension Mother   . Cancer Mother     kidney  . Lung cancer Maternal Aunt        Objective:   Physical Exam Blood pressure 128/76, pulse 115, temperature 97.5 F (36.4 C), temperature source Oral, resp. rate 17, height 5\' 7"  (1.702 m), weight 185 lb (83.915 kg), SpO2 99.00%. Body mass index is 28.97 kg/(m^2). Well-developed, well nourished WF who is awake, alert and oriented, in NAD. HEENT: [place]/AT, PERRL, EOMI.  Sclera and conjunctiva are clear.  Fundi are normal bilaterally. EAC are patent, TMs are normal in appearance. Nasal mucosa is pink and moist. OP is clear. No sinus tenderness on palpation. Neck: supple, non-tender, no lymphadenopathy, thyromegaly. Heart: RRR, no murmur Lungs: normal effort, CTA Skin: warm and dry without rash. Psychologic: good mood and appropriate affect, normal speech and behavior.     Assessment & Plan:   1. Withdrawal syndrome  oxyCODONE (OXYCONTIN) 40 MG 12 hr tablet, oxyCODONE (OXY IR/ROXICODONE) 5 MG immediate release tablet, LORazepam (ATIVAN) 1 MG tablet  2. HA (headache) -suspect subacute sinusitis amoxicillin (AMOXIL) 875 MG tablet, ipratropium (ATROVENT) 0.03 % nasal spray, Guaifenesin (MUCINEX MAXIMUM STRENGTH) 1200 MG TB12

## 2012-09-25 ENCOUNTER — Other Ambulatory Visit: Payer: Self-pay | Admitting: Physician Assistant

## 2012-09-26 ENCOUNTER — Other Ambulatory Visit: Payer: Self-pay | Admitting: Physician Assistant

## 2012-10-01 ENCOUNTER — Ambulatory Visit (INDEPENDENT_AMBULATORY_CARE_PROVIDER_SITE_OTHER): Payer: 59 | Admitting: Physician Assistant

## 2012-10-01 VITALS — BP 110/82 | HR 83 | Temp 98.2°F | Resp 18 | Wt 182.0 lb

## 2012-10-01 DIAGNOSIS — G8929 Other chronic pain: Secondary | ICD-10-CM

## 2012-10-01 DIAGNOSIS — F19939 Other psychoactive substance use, unspecified with withdrawal, unspecified: Secondary | ICD-10-CM

## 2012-10-01 MED ORDER — OXYCODONE HCL 20 MG PO TB12: 20.0000 mg | ORAL_TABLET | Freq: Two times a day (BID) | ORAL | Status: DC

## 2012-10-01 NOTE — Patient Instructions (Signed)
Evalee Jefferson, [facility] [phone] Hurley Cisco, [facility] [address], [place], [facility] [phone], [phone] Harle Stanford [phone] Theotis Barrio [phone]

## 2012-10-01 NOTE — Progress Notes (Signed)
Subjective:    Patient ID: [name], female    DOB: 08-08-1971, 41 y.o.   MRN: [id number]  HPI This 41 y.o. female presents for evaluation of withdrawal symptoms and chronic pain as we reduce her dose of narcotic pain medication. She's having diarrhea at the current dose of 40 mg BID of oxycodone.  She's beginning to worry that her pain will escalate as her dose is reduced further, though it's generally well controlled now.  She's been on the current dose for 7 days. Thanksgiving is a difficult time of year for her, and she and her partner, [name], got in an argument recently.  She's struggling with what to do, if their 5-year relationship is going to survive this.  She requests names of therapists.  She was in therapy while she was a patient at the [facility] clinic and found it helpful.   Past Medical History  Diagnosis Date  . Anxiety   . Depression   . GERD (gastroesophageal reflux disease)   . Esophageal stricture   . Hyperlipemia   . Hypertension   . History of alcohol abuse   . Migraine headache   . History of cocaine abuse   . Bipolar disorder   . Pancreatitis, acute   . Obesity     Past Surgical History  Procedure Date  . Appendectomy   . Cholecystectomy   . Abdominal hysterectomy   . Cesarean section   . Knee surgery 2010    leftx3  . Nasal septum surgery   . Carpal tunnel release 07/29/2012    Procedure: CARPAL TUNNEL RELEASE;  Surgeon: Eldred Manges, MD;  Location: [facility];  Service: Orthopedics;  Laterality: Right;  Right carpal tunnel release    Prior to Admission medications   Medication Sig Start Date End Date Taking? Authorizing Provider  ALPRAZolam Prudy Feeler) 1 MG tablet Take 1 tablet (1 mg total) by mouth 3 (three) times daily as needed. For anxiety 08/13/12  Yes Johnathan Heskett S Bo Rogue, PA-C  amLODipine (NORVASC) 10 MG tablet Take 1 tablet (10 mg total) by mouth daily. 08/02/12  Yes Ladonte Verstraete S Maxene Byington, PA-C  amoxicillin (AMOXIL) 875 MG tablet Take 2  tablets (1,750 mg total) by mouth 2 (two) times daily. 09/20/12  Yes Haydin Dunn S Kashden Deboy, PA-C  amphetamine-dextroamphetamine (ADDERALL) 30 MG tablet Take 0.5 tablets (15 mg total) by mouth 3 (three) times daily. 08/13/12  Yes Cyndel Griffey S Alberto Schoch, PA-C  betamethasone dipropionate (DIPROLENE) 0.05 % ointment Apply 1 application topically 2 (two) times daily as needed. For rashes on hands.   Yes Historical Provider, MD  cloNIDine (CATAPRES) 0.2 MG tablet Take 0.5 tablets (0.1 mg total) by mouth 3 (three) times daily as needed (withdrawl symptoms). 07/25/12  Yes Toy Baker, MD  EPINEPHrine (EPIPEN) 0.3 mg/0.3 mL DEVI Inject 0.3 mLs (0.3 mg total) into the muscle as needed (inject at time of signs of anaphylaxis). 06/04/12  Yes Tatyana A Kirichenko, PA  Guaifenesin (MUCINEX MAXIMUM STRENGTH) 1200 MG TB12 Take 1 tablet (1,200 mg total) by mouth every 12 (twelve) hours as needed. 09/20/12  Yes Janalee Grobe S Katherine Syme, PA-C  ipratropium (ATROVENT) 0.03 % nasal spray Place 2 sprays into the nose 2 (two) times daily. 09/20/12  Yes Casmir Auguste S Jhony Antrim, PA-C  LORazepam (ATIVAN) 1 MG tablet Take 1-2 tablets (1-2 mg total) by mouth every 8 (eight) hours as needed for anxiety. 09/20/12  Yes Rickell Wiehe S Shawnya Mayor, PA-C  oxyCODONE (OXY IR/ROXICODONE) 5 MG immediate release tablet Take 1 tablet (5  mg total) by mouth every 4 (four) hours as needed for pain ((breakthrough)). 09/20/12  Yes Neziah Vogelgesang S Sereen Schaff, PA-C  oxyCODONE (OXYCONTIN) 40 MG 12 hr tablet Take 1 tablet (40 mg total) by mouth every 12 (twelve) hours. 09/20/12  Yes Caly Pellum S Sharelle Burditt, PA-C  pantoprazole (PROTONIX) 40 MG tablet Take 40 mg by mouth daily.   Yes Historical Provider, MD  sertraline (ZOLOFT) 100 MG tablet TAKE 1 TABLET BY MOUTH ONCE DAILY 09/26/12  Yes Nelva Nay, PA-C    Allergies  Allergen Reactions  . Imitrex (Sumatriptan Base) Shortness Of Breath  . Metoclopramide Hcl Itching  . Morphine And Related Itching  . Tegretol (Carbamazepine) Itching  . Topamax  Itching    History   Social History  . Marital Status: Significant Other    Spouse Name: Dustin Flock    Number of Children: 2  . Years of Education: N/A   Occupational History  . CNA    Social History Main Topics  . Smoking status: Current Some Day Smoker -- 1.0 packs/day for 20 years    Types: Cigarettes  . Smokeless tobacco: Never Used  . Alcohol Use: No     Comment: 10 yr  . Drug Use: No     Comment: none since 2011  . Sexually Active: Yes -- Female partner(s)   Other Topics Concern  . Not on file   Social History Narrative   3 caffeine drinks daily Was recruited to [employer], with a full scholarship, for undergraduate studies, but had to decline due to family problems.  She then planned to attend [employer] ([employer]) but wasn't able to work and attend courses, so went to [employer] and studied Production designer, theatre/television/film.Has dropped out of the substance abuse counseling program she was enrolled in, having figured out that it's not a career path she wants to continue.  She has applied for a position with [employer] and ultimately hopes to work for [employer].  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    Family History  Problem Relation Age of Onset  . Colon cancer Neg Hx   . Heart disease Maternal Grandmother   . Kidney cancer Mother   . Hypertension Mother   . Cancer Mother     kidney  . Lung cancer Maternal Aunt     Review of Systems As above.    Objective:   Physical Exam Blood pressure 110/82, pulse 83, temperature 98.2 F (36.8 C), temperature source Oral, resp. rate 18, weight 182 lb (82.555 kg). There is no height on file to calculate BMI. Well-developed, well nourished WF who is awake, alert and oriented, in NAD. Mood is a little angry and sad. Affect is a little bit flat. HEENT: [place]/AT, sclera and conjunctiva are clear.   Heart: RRR, no murmur Lungs: normal effort, CTA Skin: warm and dry.     Assessment & Plan:   1. Withdrawal syndrome   oxyCODONE (OXYCONTIN) 20 MG 12 hr tablet  2. Chronic pain  Take with remaining 15 mg tabs to achieve 35 mg.  RTC in 1-2 weeks, when she needs additional 15 mg tabs to add to the 20 mg tabs given today.

## 2012-10-07 ENCOUNTER — Ambulatory Visit (INDEPENDENT_AMBULATORY_CARE_PROVIDER_SITE_OTHER): Payer: 59 | Admitting: Physician Assistant

## 2012-10-07 VITALS — BP 124/74 | HR 97 | Temp 97.7°F | Resp 18 | Ht 66.0 in | Wt 181.0 lb

## 2012-10-07 DIAGNOSIS — F19939 Other psychoactive substance use, unspecified with withdrawal, unspecified: Secondary | ICD-10-CM

## 2012-10-07 DIAGNOSIS — G8929 Other chronic pain: Secondary | ICD-10-CM

## 2012-10-07 DIAGNOSIS — F988 Other specified behavioral and emotional disorders with onset usually occurring in childhood and adolescence: Secondary | ICD-10-CM

## 2012-10-07 MED ORDER — AMPHETAMINE-DEXTROAMPHETAMINE 30 MG PO TABS: 15.0000 mg | ORAL_TABLET | Freq: Three times a day (TID) | ORAL | Status: DC

## 2012-10-07 MED ORDER — OXYCODONE HCL 5 MG PO TABS: 5.0000 mg | ORAL_TABLET | ORAL | Status: DC | PRN

## 2012-10-07 MED ORDER — LORAZEPAM 1 MG PO TABS: 1.0000 mg | ORAL_TABLET | Freq: Three times a day (TID) | ORAL | Status: DC | PRN

## 2012-10-07 NOTE — Patient Instructions (Signed)
Take 2 of the 20 mg tabs twice daily to achieve 40 mg twice daily.  Plan to Return for re-evaluation in 10 days.  If your symptoms resolve prior to that, you may reduce the dose to 35 mg again by taking a 20 mg and a 15 mg.

## 2012-10-07 NOTE — Progress Notes (Signed)
Subjective:    Patient ID: [name], female    DOB: Jan 24, 1971, 41 y.o.   MRN: [id number]  HPI  Dropped down to 35 mg BID on 10/04/2012.  Developed diarrhea, nausea, diaphoresis.  Has discussed her relationship with her partner, and they have agreed to go to counseling together, but they will wait until the new insurance year in January and use FLEX funds.  Past Medical History  Diagnosis Date  . Anxiety   . Depression   . GERD (gastroesophageal reflux disease)   . Esophageal stricture   . Hyperlipemia   . Hypertension   . History of alcohol abuse   . Migraine headache   . History of cocaine abuse   . Bipolar disorder   . Pancreatitis, acute   . Obesity     Past Surgical History  Procedure Date  . Appendectomy   . Cholecystectomy   . Abdominal hysterectomy   . Cesarean section   . Knee surgery 2010    leftx3  . Nasal septum surgery   . Carpal tunnel release 07/29/2012    Procedure: CARPAL TUNNEL RELEASE;  Surgeon: Eldred Manges, MD;  Location: [facility];  Service: Orthopedics;  Laterality: Right;  Right carpal tunnel release    Prior to Admission medications   Medication Sig Start Date End Date Taking? Authorizing Provider  ALPRAZolam Prudy Feeler) 1 MG tablet Take 1 tablet (1 mg total) by mouth 3 (three) times daily as needed. For anxiety 08/13/12  Yes Deniz Eskridge S Junaid Wurzer, PA-C  amLODipine (NORVASC) 10 MG tablet Take 1 tablet (10 mg total) by mouth daily. 08/02/12  Yes Severa Jeremiah S Tynika Luddy, PA-C  amoxicillin (AMOXIL) 875 MG tablet Take 2 tablets (1,750 mg total) by mouth 2 (two) times daily. 09/20/12  Yes Maalik Pinn S Ezequias Lard, PA-C  amphetamine-dextroamphetamine (ADDERALL) 30 MG tablet Take 0.5 tablets (15 mg total) by mouth 3 (three) times daily. 08/13/12  Yes Lemya Greenwell S Leiland Mihelich, PA-C  betamethasone dipropionate (DIPROLENE) 0.05 % ointment Apply 1 application topically 2 (two) times daily as needed. For rashes on hands.   Yes Historical Provider, MD  cloNIDine (CATAPRES) 0.2  MG tablet Take 0.5 tablets (0.1 mg total) by mouth 3 (three) times daily as needed (withdrawl symptoms). 07/25/12  Yes Toy Baker, MD  EPINEPHrine (EPIPEN) 0.3 mg/0.3 mL DEVI Inject 0.3 mLs (0.3 mg total) into the muscle as needed (inject at time of signs of anaphylaxis). 06/04/12  Yes Tatyana A Kirichenko, PA  Guaifenesin (MUCINEX MAXIMUM STRENGTH) 1200 MG TB12 Take 1 tablet (1,200 mg total) by mouth every 12 (twelve) hours as needed. 09/20/12  Yes Amro Winebarger S Tyrece Vanterpool, PA-C  ipratropium (ATROVENT) 0.03 % nasal spray Place 2 sprays into the nose 2 (two) times daily. 09/20/12  Yes Talasia Saulter S Rishit Burkhalter, PA-C  LORazepam (ATIVAN) 1 MG tablet Take 1-2 tablets (1-2 mg total) by mouth every 8 (eight) hours as needed for anxiety. 09/20/12  Yes Dyneshia Baccam S Devina Bezold, PA-C  oxyCODONE (OXY IR/ROXICODONE) 5 MG immediate release tablet Take 1 tablet (5 mg total) by mouth every 4 (four) hours as needed for pain ((breakthrough)). 09/20/12  Yes Mahika Vanvoorhis S Jeromiah Ohalloran, PA-C  OxyCODONE (OXYCONTIN) 15 mg TB12 (has a few of these left) Take 1 tablet by mouth every 12 (twelve) hours. Take with 20 mg to achieve 35 mg 09/12/12  Yes Karlo Goeden S Yocheved Depner, PA-C  oxyCODONE (OXYCONTIN) 20 MG 12 hr tablet Take 1 tablet (20 mg total) by mouth every 12 (twelve) hours. Take with 15 mg tab to  achieve 35 mg. 10/01/12  Yes Maury Bamba S Shail Urbas, PA-C  pantoprazole (PROTONIX) 40 MG tablet Take 40 mg by mouth daily.   Yes Historical Provider, MD  sertraline (ZOLOFT) 100 MG tablet TAKE 1 TABLET BY MOUTH ONCE DAILY 09/26/12  Yes Nelva Nay, PA-C    Allergies  Allergen Reactions  . Imitrex (Sumatriptan Base) Shortness Of Breath  . Metoclopramide Hcl Itching  . Morphine And Related Itching  . Tegretol (Carbamazepine) Itching  . Topamax Itching    History   Social History  . Marital Status: Significant Other    Spouse Name: Dustin Flock    Number of Children: 2  . Years of Education: N/A   Occupational History  . CNA    Social History Main  Topics  . Smoking status: Current Some Day Smoker -- 1.0 packs/day for 20 years    Types: Cigarettes  . Smokeless tobacco: Never Used  . Alcohol Use: No     Comment: 10 yr  . Drug Use: No     Comment: none since 2011  . Sexually Active: Yes -- Female partner(s)   Other Topics Concern  . Not on file   Social History Narrative   3 caffeine drinks daily Was recruited to [employer], with a full scholarship, for undergraduate studies, but had to decline due to family problems.  She then planned to attend [employer] ([employer]) but wasn't able to work and attend courses, so went to [employer] and studied Production designer, theatre/television/film.Has dropped out of the substance abuse counseling program she was enrolled in, having figured out that it's not a career path she wants to continue.  She has applied for a position with [employer] and ultimately hopes to work for [employer].  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    Family History  Problem Relation Age of Onset  . Colon cancer Neg Hx   . Heart disease Maternal Grandmother   . Kidney cancer Mother   . Hypertension Mother   . Cancer Mother     kidney  . Lung cancer Maternal Aunt     Review of Systems As above.  No urinary symptoms.    Objective:   Physical Exam Blood pressure 124/74, pulse 97, temperature 97.7 F (36.5 C), temperature source Oral, resp. rate 18, height 5\' 6"  (1.676 m), weight 181 lb (82.101 kg), SpO2 100.00%. Body mass index is 29.21 kg/(m^2). Well-developed, well nourished WF who is awake, alert and oriented, in NAD. Accompanied by her older daughter today. HEENT: [place]/AT, sclera and conjunctiva are clear.  EAC are patent, TMs are normal in appearance. Nasal mucosa is pink and moist. OP is clear. Neck: supple, non-tender, no lymphadenopathy, thyromegaly. Heart: RRR, no murmur Lungs: normal effort, CTA Extremities: no cyanosis, clubbing or edema. Skin: warm and dry without rash. Psychologic: good mood and appropriate  affect, normal speech and behavior.     Assessment & Plan:   1. Withdrawal syndrome  LORazepam (ATIVAN) 1 MG tablet, oxyCODONE (OXY IR/ROXICODONE) 5 MG immediate release tablet  2. Chronic pain  oxyCODONE (OXY IR/ROXICODONE) 5 MG immediate release tablet  3. ADD (attention deficit disorder)  amphetamine-dextroamphetamine (ADDERALL) 30 MG tablet, DISCONTINUED: amphetamine-dextroamphetamine (ADDERALL) 30 MG tablet   RTC 10 days, sooner PRN.

## 2012-10-08 ENCOUNTER — Other Ambulatory Visit: Payer: Self-pay | Admitting: Physician Assistant

## 2012-10-08 ENCOUNTER — Encounter: Payer: Self-pay | Admitting: Physician Assistant

## 2012-10-08 MED ORDER — OXYCODONE HCL 40 MG PO TB12: 40.0000 mg | ORAL_TABLET | Freq: Two times a day (BID) | ORAL | Status: DC

## 2012-10-17 ENCOUNTER — Encounter: Payer: Self-pay | Admitting: Physician Assistant

## 2012-10-17 ENCOUNTER — Other Ambulatory Visit: Payer: Self-pay | Admitting: Physician Assistant

## 2012-10-17 DIAGNOSIS — F19939 Other psychoactive substance use, unspecified with withdrawal, unspecified: Secondary | ICD-10-CM

## 2012-10-17 DIAGNOSIS — G8929 Other chronic pain: Secondary | ICD-10-CM

## 2012-10-17 MED ORDER — OXYCODONE HCL 20 MG PO TB12: 40.0000 mg | ORAL_TABLET | Freq: Two times a day (BID) | ORAL | Status: DC

## 2012-10-23 ENCOUNTER — Other Ambulatory Visit: Payer: Self-pay | Admitting: Physician Assistant

## 2012-10-23 ENCOUNTER — Telehealth: Payer: Self-pay

## 2012-10-23 ENCOUNTER — Encounter: Payer: Self-pay | Admitting: Physician Assistant

## 2012-10-23 DIAGNOSIS — G8929 Other chronic pain: Secondary | ICD-10-CM

## 2012-10-23 DIAGNOSIS — F19939 Other psychoactive substance use, unspecified with withdrawal, unspecified: Secondary | ICD-10-CM

## 2012-10-23 MED ORDER — OXYCODONE HCL 5 MG PO TABS: 5.0000 mg | ORAL_TABLET | ORAL | Status: DC | PRN

## 2012-10-23 MED ORDER — LORAZEPAM 1 MG PO TABS: 1.0000 mg | ORAL_TABLET | Freq: Three times a day (TID) | ORAL | Status: DC | PRN

## 2012-10-23 MED ORDER — CLONIDINE HCL 0.2 MG PO TABS: 0.1000 mg | ORAL_TABLET | Freq: Three times a day (TID) | ORAL | Status: DC | PRN

## 2012-10-23 NOTE — Telephone Encounter (Signed)
Patient notified and voiced understanding.

## 2012-10-23 NOTE — Telephone Encounter (Signed)
Clonidine sent to pharmacy.

## 2012-10-23 NOTE — Telephone Encounter (Signed)
Patient states that pharmacy sent over request for rx refill and no response has been given. [employer] [phone]

## 2012-10-23 NOTE — Telephone Encounter (Signed)
Called patient she needs the Clonidine. Please advise.

## 2012-10-24 ENCOUNTER — Encounter: Payer: 59 | Admitting: Physician Assistant

## 2012-10-24 ENCOUNTER — Ambulatory Visit (INDEPENDENT_AMBULATORY_CARE_PROVIDER_SITE_OTHER): Payer: 59 | Admitting: Physician Assistant

## 2012-10-24 VITALS — BP 133/84 | HR 125 | Temp 98.5°F | Resp 16 | Ht 66.0 in | Wt 183.0 lb

## 2012-10-24 DIAGNOSIS — R4583 Excessive crying of child, adolescent or adult: Secondary | ICD-10-CM

## 2012-10-24 DIAGNOSIS — F19939 Other psychoactive substance use, unspecified with withdrawal, unspecified: Secondary | ICD-10-CM

## 2012-10-24 DIAGNOSIS — R52 Pain, unspecified: Secondary | ICD-10-CM

## 2012-10-24 MED ORDER — OXYCODONE HCL 40 MG PO TB12: 40.0000 mg | ORAL_TABLET | Freq: Two times a day (BID) | ORAL | Status: DC

## 2012-10-24 NOTE — Patient Instructions (Signed)
[facility] Policy for Prescribing Controlled Substances (Revised 09/2012) 1. Prescriptions for controlled substances will be filled by ONE provider at [facility] with whom you have established and developed a plan for your care, including follow-up. 2. You are encouraged to schedule an appointment with your prescriber at our appointment center for follow-up visits whenever possible. 3. If you request a prescription for the controlled substance while at [facility] for an acute problem (with someone other than your regular prescriber), you MAY be given a ONE-TIME prescription for a 30-day supply of the controlled substance, to allow time for you to return to see your regular prescriber for additional prescriptions. 

## 2012-10-24 NOTE — Progress Notes (Signed)
  Subjective:    Patient ID: [name], female    DOB: [birth year], 41 y.o.   MRN: [id number]  HPI This 41 y.o. female presents for evaluation of withdrawal syndrome and chronic pain.  As we have reduced her dose, she's had increased pain, struggled with diarrhea, nausea and diaphoresis, and now prolonged discord with her partner.  She's worried that I will not continue to prescribe her medications at the current dose while she deals with the increased social stress, which of course exacerbates her pain.  Her daughter is present today to help make sure she communicates her needs and accurately describes the situation at home.  She and her partner agreed several weeks ago to attending counseling together after the first of the year and their Flex monies are replenished, but since then their communication has broken down almost entirely.  In addition, her partner is contributing less to the upkeep of the home they share, which resulted in increased housework for her, and then increased back and knee pain.   Patient Active Problem List  Diagnosis  . Abdominal pain, epigastric  . Migraine headache  . Bipolar disorder  . History of substance abuse  . Hypertension  . Constipation  . OSA (obstructive sleep apnea)  . Carpal tunnel syndrome, bilateral  . Carpal tunnel syndrome of right wrist  . Pain   Past Surgical History  Procedure Date  . Appendectomy   . Cholecystectomy   . Abdominal hysterectomy   . Cesarean section   . Knee surgery 2010    leftx3  . Nasal septum surgery   . Carpal tunnel release 07/29/2012    Procedure: CARPAL TUNNEL RELEASE;  Surgeon: Eldred Manges, MD;  Location: [facility];  Service: Orthopedics;  Laterality: Right;  Right carpal tunnel release   Review of Systems As above.    Objective:   Physical Exam BP 133/84  Pulse 125  Temp 98.5 F (36.9 C)  Resp 16  Ht 5\' 6"  (1.676 m)  Wt 183 lb (83.008 kg)  BMI 29.54 kg/m2 WDWNWF, A&O x 3 Tearful,  but normal speech and thought, good insight, appropriate behavior.     Assessment & Plan:   1. Withdrawal syndrome    2. Pain  oxyCODONE (OXYCONTIN) 40 MG 12 hr tablet   Continue on this dose x 4 weeks.  Pursue relationship counseling.

## 2012-10-28 ENCOUNTER — Ambulatory Visit (INDEPENDENT_AMBULATORY_CARE_PROVIDER_SITE_OTHER): Payer: 59 | Admitting: Physician Assistant

## 2012-10-28 VITALS — BP 122/81 | HR 108 | Temp 97.9°F | Resp 16 | Ht 66.5 in | Wt 187.0 lb

## 2012-10-28 DIAGNOSIS — H6690 Otitis media, unspecified, unspecified ear: Secondary | ICD-10-CM

## 2012-10-28 DIAGNOSIS — J029 Acute pharyngitis, unspecified: Secondary | ICD-10-CM

## 2012-10-28 DIAGNOSIS — R6889 Other general symptoms and signs: Secondary | ICD-10-CM

## 2012-10-28 DIAGNOSIS — J111 Influenza due to unidentified influenza virus with other respiratory manifestations: Secondary | ICD-10-CM

## 2012-10-28 DIAGNOSIS — R509 Fever, unspecified: Secondary | ICD-10-CM

## 2012-10-28 MED ORDER — AMOXICILLIN 875 MG PO TABS: 875.0000 mg | ORAL_TABLET | Freq: Two times a day (BID) | ORAL | Status: DC

## 2012-10-28 NOTE — Patient Instructions (Addendum)
Take the antibiotic as directed.  Be sure to finish the full course.  Take it with food to reduce stomach upset.  Plenty of fluids and rest.  Use Tylenol or Advil for fever reduction/pain relief.  Good hand hygiene to prevent the spread of germs.  Mucinex and/or Zyrtec over the counter can be use if you develop worsening cough or runny nose.  Let us know if you are worsening or not improving.

## 2012-10-28 NOTE — Progress Notes (Signed)
Subjective:    Patient ID: [name], female    DOB: January 04, 1971, 41 y.o.   MRN: [id number]  HPI   [name] is a 41 yr old female here with sore throat, headache, and fever that began last night.  Tmax 101.38F.  Feels exhausted.  Some nausea but no vomiting.  Has had a little runny nose, some cough but not bad.  Experiencing body aches and muscle aches.  Additionally, right ear hurting as well. Has used Advil and Dayquil for symptoms.  May have had flu exposure but got her flu shot in September.  Accompanied by partner.   Review of Systems  Constitutional: Positive for fever and chills.  HENT: Positive for ear pain, sore throat and rhinorrhea.   Respiratory: Positive for cough. Negative for shortness of breath and wheezing.   Cardiovascular: Negative for chest pain.  Gastrointestinal: Positive for nausea. Negative for vomiting.  Musculoskeletal: Positive for myalgias and arthralgias.  Skin: Negative.   Neurological: Positive for headaches.       Objective:   Physical Exam  Vitals reviewed. Constitutional: She is oriented to person, place, and time. She appears well-developed and well-nourished. She appears ill. No distress.  HENT:  Head: Normocephalic and atraumatic.  Right Ear: External ear normal. Tympanic membrane is erythematous.  Left Ear: Tympanic membrane, external ear and ear canal normal.  Nose: Mucosal edema and rhinorrhea present.  Mouth/Throat: Uvula is midline and mucous membranes are normal. Posterior oropharyngeal erythema present. No oropharyngeal exudate, posterior oropharyngeal edema or tonsillar abscesses.  Neck: Neck supple.  Cardiovascular: Regular rhythm and normal heart sounds.  Tachycardia present.  Exam reveals no gallop and no friction rub.   No murmur heard. Pulmonary/Chest: Effort normal and breath sounds normal. She has no wheezes. She has no rales.  Abdominal: Soft. Bowel sounds are normal.  Lymphadenopathy:    She has no cervical adenopathy.   Neurological: She is alert and oriented to person, place, and time.  Skin: Skin is warm and dry.  Psychiatric: Her behavior is normal. Her affect is blunt.      Filed Vitals:   10/28/12 1811  BP: 122/81  Pulse: 108  Temp: 97.9 F (36.6 C)  Resp: 16      Results for orders placed in visit on 10/28/12  POCT RAPID STREP A (OFFICE)      Component Value Range   Rapid Strep A Screen Negative  Negative  POCT INFLUENZA A/B      Component Value Range   Influenza A, POC Negative     Influenza B, POC Negative         Assessment & Plan:   1. Flu-like symptoms  POCT Influenza A/B  2. Otitis media  amoxicillin (AMOXIL) 875 MG tablet  3. Sore throat  POCT rapid strep A  4. Fever  POCT rapid strep A, POCT Influenza A/B    [name] is a 41 yr old female with one day of flu like symptoms.  Rapid strep and rapid flu are both negative.  Suspect viral etiology.  On ROS pt stated that she had some significant right ear pain, and on exam the TM is erythematous.  She may have an otitis media.  Gave her the option of starting abx today or waiting to see if symptoms resolve.  Pt would like to begin abx today with the holiday approaching.  Encouraged plenty of fluids and rest.  Encouraged good hand hygiene.  Advil/Tylenol for fever reduction/pain relief.  OTC Mucinex and/or  Zyrtec if needed.  Discussed RTC precautions.  Pt is in agreement with this plan.

## 2012-11-04 ENCOUNTER — Ambulatory Visit (INDEPENDENT_AMBULATORY_CARE_PROVIDER_SITE_OTHER): Payer: 59 | Admitting: Physician Assistant

## 2012-11-04 VITALS — BP 157/94 | HR 101 | Temp 98.1°F | Resp 16 | Ht 67.0 in | Wt 192.0 lb

## 2012-11-04 DIAGNOSIS — F19939 Other psychoactive substance use, unspecified with withdrawal, unspecified: Secondary | ICD-10-CM

## 2012-11-04 DIAGNOSIS — H9209 Otalgia, unspecified ear: Secondary | ICD-10-CM

## 2012-11-04 DIAGNOSIS — R110 Nausea: Secondary | ICD-10-CM

## 2012-11-04 DIAGNOSIS — G8929 Other chronic pain: Secondary | ICD-10-CM

## 2012-11-04 DIAGNOSIS — J029 Acute pharyngitis, unspecified: Secondary | ICD-10-CM

## 2012-11-04 DIAGNOSIS — R5381 Other malaise: Secondary | ICD-10-CM

## 2012-11-04 DIAGNOSIS — R5383 Other fatigue: Secondary | ICD-10-CM

## 2012-11-04 LAB — POCT CBC
HCT, POC: 44.2 % (ref 37.7–47.9)
Lymph, poc: 2.7 (ref 0.6–3.4)
MCH, POC: 28.4 pg (ref 27–31.2)
MCHC: 30.8 g/dL — AB (ref 31.8–35.4)
MPV: 7.1 fL (ref 0–99.8)
POC Granulocyte: 4.9 (ref 2–6.9)
POC LYMPH PERCENT: 34.2 %L (ref 10–50)
POC MID %: 4.9 %M (ref 0–12)
RDW, POC: 13.1 %
WBC: 8 10*3/uL (ref 4.6–10.2)

## 2012-11-04 MED ORDER — CLARITHROMYCIN ER 500 MG PO TB24: 1000.0000 mg | ORAL_TABLET | Freq: Every day | ORAL | Status: DC

## 2012-11-04 MED ORDER — OXYCODONE HCL ER 60 MG PO T12A: 60.0000 mg | EXTENDED_RELEASE_TABLET | Freq: Two times a day (BID) | ORAL | Status: DC

## 2012-11-04 MED ORDER — PREDNISONE 20 MG PO TABS: ORAL_TABLET | ORAL | Status: DC

## 2012-11-04 NOTE — Progress Notes (Signed)
Subjective:    Patient ID: [name], female    DOB: [birth year], 41 y.o.   MRN: [id number]  HPI  This 41 y.o. female presents for evaluation of an illness  For more than 1 week. She was seen 1 week ago with symptoms of sore throat and RIGHT ear pain, fever, chills, myalgias and fatigue.  At that time, she had a negative rapid strep and negative rapid influenza. Due to mild erythema of the RIGHT TM, she was started on a course of amoxicillin.  She reports no improvement.  Due to the illness, she has had increased generalized pain, and has increased her oxycontin from 40 mg Q12 hours to 40-80 mg Q12 hours.  She reports she didn't realize how often she had bumped the dose up, and only has about 8 pills left (she should have 38). She complains of "wooziness" and nausea when she stands and hasn't been able to work.  Home stress continues.  It sounds as though both she and [name] are ready to call it quits, but neither wants to be the one who leaves.  They were planning to see a counselor together after the New year, but it's not clear that either of them is motivated to do that at this point. She asked for help changing her password for My Chart so that [name] can't read her messages to me, but also worries that [name] will become angry when she discovers it.   Past Medical History  Diagnosis Date  . Anxiety   . Depression   . GERD (gastroesophageal reflux disease)   . Esophageal stricture   . Hyperlipemia   . Hypertension   . History of alcohol abuse   . Migraine headache   . History of cocaine abuse   . Bipolar disorder   . Pancreatitis, acute   . Obesity     Past Surgical History  Procedure Date  . Appendectomy   . Cholecystectomy   . Abdominal hysterectomy   . Cesarean section   . Knee surgery 2010    leftx3  . Nasal septum surgery   . Carpal tunnel release 07/29/2012    Procedure: CARPAL TUNNEL RELEASE;  Surgeon: Eldred Manges, MD;  Location: [facility];   Service: Orthopedics;  Laterality: Right;  Right carpal tunnel release    Prior to Admission medications   Medication Sig Start Date End Date Taking? Authorizing Provider  ALPRAZolam Prudy Feeler) 1 MG tablet Take 1 tablet (1 mg total) by mouth 3 (three) times daily as needed. For anxiety 08/13/12  Yes Dowell Hoon S Sayler Mickiewicz, PA-C  amLODipine (NORVASC) 10 MG tablet Take 1 tablet (10 mg total) by mouth daily. 08/02/12  Yes Alonzo Loving S Joniah Bednarski, PA-C  amoxicillin (AMOXIL) 875 MG tablet Take 1 tablet (875 mg total) by mouth 2 (two) times daily. 10/28/12  Yes Eleanore E Debbra Riding, PA-C  amphetamine-dextroamphetamine (ADDERALL) 30 MG tablet Take 0.5 tablets (15 mg total) by mouth 3 (three) times daily. 10/07/12  Yes Nickey Canedo S Mollyann Halbert, PA-C  cloNIDine (CATAPRES) 0.2 MG tablet Take 0.5 tablets (0.1 mg total) by mouth 3 (three) times daily as needed (withdrawl symptoms). 10/23/12  Yes Eleanore E Debbra Riding, PA-C  EPINEPHrine (EPIPEN) 0.3 mg/0.3 mL DEVI Inject 0.3 mLs (0.3 mg total) into the muscle as needed (inject at time of signs of anaphylaxis). 06/04/12  Yes Tatyana A Kirichenko, PA  LORazepam (ATIVAN) 1 MG tablet Take 1-2 tablets (1-2 mg total) by mouth every 8 (eight) hours as needed for anxiety. 10/23/12  Yes Melady Chow S Aime Carreras, PA-C  oxyCODONE (OXY IR/ROXICODONE) 5 MG immediate release tablet Take 1 tablet (5 mg total) by mouth every 4 (four) hours as needed for pain ((breakthrough)). 10/23/12  Yes Thailyn Khalid S Revella Shelton, PA-C  oxyCODONE (OXYCONTIN) 40 MG 12 hr tablet Take 1 tablet (40 mg total) by mouth every 12 (twelve) hours. 10/24/12  Yes Latiesha Harada S Alejah Aristizabal, PA-C  sertraline (ZOLOFT) 100 MG tablet TAKE 1 TABLET BY MOUTH ONCE DAILY 09/26/12  Yes Nelva Nay, PA-C    Allergies  Allergen Reactions  . Imitrex (Sumatriptan Base) Shortness Of Breath  . Metoclopramide Hcl Itching  . Morphine And Related Itching  . Tegretol (Carbamazepine) Itching  . Topamax Itching    History   Social History  . Marital Status: Significant  Other    Spouse Name: Dustin Flock    Number of Children: 2  . Years of Education: N/A   Occupational History  . CNA    Social History Main Topics  . Smoking status: Current Every Day Smoker -- 1.0 packs/day for 20 years    Types: Cigarettes  . Smokeless tobacco: Never Used  . Alcohol Use: No     Comment: 10 yr  . Drug Use: No     Comment: none since 2011  . Sexually Active: Yes -- Female partner(s)   Other Topics Concern  . Not on file   Social History Narrative   3 caffeine drinks daily Was recruited to [employer], with a full scholarship, for undergraduate studies, but had to decline due to family problems.  She then planned to attend [employer] ([employer]) but wasn't able to work and attend courses, so went to [employer] and studied Production designer, theatre/television/film.Has dropped out of the substance abuse counseling program she was enrolled in, having figured out that it's not a career path she wants to continue.  She has applied for a position with [employer] and ultimately hopes to work for [employer].  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    Family History  Problem Relation Age of Onset  . Colon cancer Neg Hx   . Heart disease Maternal Grandmother   . Kidney cancer Mother   . Hypertension Mother   . Cancer Mother     kidney  . Lung cancer Maternal Aunt     Review of Systems As above.    Objective:   Physical Exam Blood pressure 157/94, pulse 101, temperature 98.1 F (36.7 C), resp. rate 16, height 5\' 7"  (1.702 m), weight 192 lb (87.091 kg), SpO2 97.00%. Body mass index is 30.07 kg/(m^2). Well-developed, well nourished WF who is awake, alert and oriented, in NAD. HEENT: [place]/AT, PERRL, EOMI.  Sclera and conjunctiva are clear.  EAC are patent, TMs are normal in appearance. Nasal mucosa is pink and moist. OP is clear. No frontal sinus tenderness.  Palpation over the maxillary sinuses may cause mild tenderness on the RIGHT. Neck: supple, non-tender, no  lymphadenopathy, thyromegaly. Heart: RRR, no murmur Lungs: normal effort, CTA Extremities: no cyanosis, clubbing or edema. Skin: warm and dry without rash. Psychologic: good mood and appropriate affect, normal speech and behavior.    Results for orders placed in visit on 11/04/12  POCT CBC      Component Value Range   WBC 8.0  4.6 - 10.2 K/uL   Lymph, poc 2.7  0.6 - 3.4   POC LYMPH PERCENT 34.2  10 - 50 %L   MID (cbc) 0.4  0 -  0.9   POC MID % 4.9  0 - 12 %M   POC Granulocyte 4.9  2 - 6.9   Granulocyte percent 60.9  37 - 80 %G   RBC 4.79  4.04 - 5.48 M/uL   Hemoglobin 13.6  12.2 - 16.2 g/dL   HCT, POC 56.2  13.0 - 47.9 %   MCV 92.2  80 - 97 fL   MCH, POC 28.4  27 - 31.2 pg   MCHC 30.8 (*) 31.8 - 35.4 g/dL   RDW, POC 86.5     Platelet Count, POC 306  142 - 424 K/uL   MPV 7.1  0 - 99.8 fL       Assessment & Plan:   1. Otalgia    2. Pharyngitis  clarithromycin (BIAXIN XL) 500 MG 24 hr tablet, predniSONE (DELTASONE) 20 MG tablet  3. Fatigue  POCT CBC  4. Nausea    5. Withdrawal syndrome  OxyCODONE HCl ER (OXYCONTIN) 60 MG T12A  6. Chronic pain  OxyCODONE HCl ER (OXYCONTIN) 60 MG T12A   I wonder if she doesn't have a sinusitis secondary to the flu, despite having had a negative flu test 10/28/12.  We discussed her oxycontin use at length and she is reminded that if she's needing more pain medication, whatever the reason, she needs to notify me.  I can only be flexible with such circumstances if we are both very transparent.  I've increased the dose to 60 mg Q12 hours, and we'll re-visit in 2-4 weeks.

## 2012-11-07 ENCOUNTER — Encounter: Payer: Self-pay | Admitting: Physician Assistant

## 2012-11-07 ENCOUNTER — Telehealth: Payer: Self-pay

## 2012-11-07 DIAGNOSIS — G8929 Other chronic pain: Secondary | ICD-10-CM

## 2012-11-07 DIAGNOSIS — F19939 Other psychoactive substance use, unspecified with withdrawal, unspecified: Secondary | ICD-10-CM

## 2012-11-07 MED ORDER — OXYCODONE HCL 5 MG PO TABS: 5.0000 mg | ORAL_TABLET | ORAL | Status: DC | PRN

## 2012-11-07 NOTE — Telephone Encounter (Signed)
PT STATES THE PHARMACY WAS SENDING A REQUEST FOR A REFILL ON HER XANAX, BUT IN THE MEANTIME SHE WOULD LIKE [name] TO WRITE A PRESCRIPTION FOR HER OXYCODONE. PLEASE CALL [phone]

## 2012-11-07 NOTE — Telephone Encounter (Signed)
See the My Chart message.  Thanks!

## 2012-11-08 ENCOUNTER — Other Ambulatory Visit: Payer: Self-pay | Admitting: Physician Assistant

## 2012-11-08 ENCOUNTER — Other Ambulatory Visit: Payer: Self-pay | Admitting: Radiology

## 2012-11-08 DIAGNOSIS — F419 Anxiety disorder, unspecified: Secondary | ICD-10-CM

## 2012-11-08 MED ORDER — ALPRAZOLAM 1 MG PO TABS: 1.0000 mg | ORAL_TABLET | Freq: Three times a day (TID) | ORAL | Status: DC | PRN

## 2012-11-08 NOTE — Telephone Encounter (Signed)
I could not view the my chart message, I could see patient part but not the reply, but now I am able to view it. Patient has gotten her Oxy IR.

## 2012-11-08 NOTE — Telephone Encounter (Signed)
Called in the Alprazolam, called patient to advise.

## 2012-11-08 NOTE — Telephone Encounter (Signed)
Pt came in this am and wanted the rx for Xanax. Rx for oxycodone was ready but the other was not.  I advised her that we will this message to you and will let her know when we send it in for her.

## 2012-11-11 ENCOUNTER — Encounter: Payer: Self-pay | Admitting: Physician Assistant

## 2012-11-11 ENCOUNTER — Telehealth: Payer: Self-pay

## 2012-11-11 DIAGNOSIS — F419 Anxiety disorder, unspecified: Secondary | ICD-10-CM

## 2012-11-11 MED ORDER — ALPRAZOLAM 1 MG PO TABS: 1.0000 mg | ORAL_TABLET | Freq: Three times a day (TID) | ORAL | Status: DC | PRN

## 2012-11-11 NOTE — Telephone Encounter (Signed)
See pt email from 11/11/12. Called [facility] to check to see if they received the #60 of Xanax we had phoned in to make up the difference between the #30 she was given at [facility] and the #90 that she normally gets for 30 days. The pharmacist stated that they did not receive the add'l #60 and reported that pt has already p/up the #30 so they can not add to that RF. She stated that if we call in #60 now, it will just short her again and asked for a RF for the correct #90 for pt to fill in 10 days. I gave her the OK and changed it in EPIC.

## 2012-11-13 ENCOUNTER — Ambulatory Visit (INDEPENDENT_AMBULATORY_CARE_PROVIDER_SITE_OTHER): Payer: 59 | Admitting: Physician Assistant

## 2012-11-13 VITALS — BP 134/81 | HR 106 | Temp 98.1°F | Resp 16 | Ht 66.0 in | Wt 187.0 lb

## 2012-11-13 DIAGNOSIS — R110 Nausea: Secondary | ICD-10-CM

## 2012-11-13 DIAGNOSIS — R1013 Epigastric pain: Secondary | ICD-10-CM

## 2012-11-13 LAB — POCT CBC
HCT, POC: 45.2 % (ref 37.7–47.9)
Lymph, poc: 6.9 — AB (ref 0.6–3.4)
MCH, POC: 28.8 pg (ref 27–31.2)
MCV: 91.2 fL (ref 80–97)
MID (cbc): 0.8 (ref 0–0.9)
Platelet Count, POC: 436 10*3/uL — AB (ref 142–424)
RDW, POC: 13.3 %

## 2012-11-13 LAB — POCT UA - MICROSCOPIC ONLY
Bacteria, U Microscopic: NEGATIVE
Casts, Ur, LPF, POC: NEGATIVE
Crystals, Ur, HPF, POC: NEGATIVE
RBC, urine, microscopic: NEGATIVE

## 2012-11-13 LAB — COMPREHENSIVE METABOLIC PANEL
Alkaline Phosphatase: 72 U/L (ref 39–117)
BUN: 11 mg/dL (ref 6–23)
CO2: 29 mEq/L (ref 19–32)
Creat: 0.71 mg/dL (ref 0.50–1.10)
Glucose, Bld: 98 mg/dL (ref 70–99)
Sodium: 139 mEq/L (ref 135–145)
Total Bilirubin: 0.3 mg/dL (ref 0.3–1.2)

## 2012-11-13 LAB — POCT URINALYSIS DIPSTICK
Ketones, UA: NEGATIVE
Protein, UA: NEGATIVE
Spec Grav, UA: 1.01
Urobilinogen, UA: 0.2
pH, UA: 6

## 2012-11-13 NOTE — Progress Notes (Signed)
Subjective:    Patient ID: [name], female    DOB: [birth year], 42 y.o.   MRN: [id number]  HPI This 42 y.o. female presents for evaluation of abdominal pain and nausea. Developed upper abdominal pain the day before yesterday.  "Nothing I can't tolerate."  But, getting worse.  Feels like when she had pancreatitis previously.  Some nausea above what she's been experiencing coming down on the Oxycontin dose. No fever, chills. No vomiting. Took a dose of Zofran the day before yesterday, which helped.  Hasn't felt so bad she needed to take it again.  Home stress has improved somewhat, doesn't feel as tense.  Still planning on marriage counselling Review of Systems As above.    Objective:   Physical Exam Blood pressure 134/81, pulse 106, temperature 98.1 F (36.7 C), temperature source Oral, resp. rate 16, height 5\' 6"  (1.676 m), weight 187 lb (84.823 kg), SpO2 99.00%. Body mass index is 30.18 kg/(m^2). Well-developed, well nourished WF who is awake, alert and oriented, in NAD. HEENT: [place]/AT, sclera and conjunctiva are clear.  Neck: supple, non-tender, no lymphadenopathy, thyromegaly. Heart: RRR, no murmur Lungs: normal effort, CTA Abdomen: normo-active bowel sounds, supple, no mass or organomegaly. Tenderness primarily in the epigastrum, with some RUQ tenderness.  No rebound tenderness.  Palpation in the RLQ causes pressure in the RUQ. Extremities: no cyanosis, clubbing or edema. Skin: warm and dry without rash. Psychologic: good mood and appropriate affect, normal speech and behavior.   Results for orders placed in visit on 11/13/12  POCT CBC      Component Value Range   WBC 14.2 (*) 4.6 - 10.2 K/uL   Lymph, poc 6.9 (*) 0.6 - 3.4   POC LYMPH PERCENT 48.5  10 - 50 %L   MID (cbc) 0.8  0 - 0.9   POC MID % 5.5  0 - 12 %M   POC Granulocyte 6.5  2 - 6.9   Granulocyte percent 46.0  37 - 80 %G   RBC 4.96  4.04 - 5.48 M/uL   Hemoglobin 14.3  12.2 - 16.2 g/dL   HCT, POC 40.9  81.1 - 47.9 %    MCV 91.2  80 - 97 fL   MCH, POC 28.8  27 - 31.2 pg   MCHC 31.6 (*) 31.8 - 35.4 g/dL   RDW, POC 91.4     Platelet Count, POC 436 (*) 142 - 424 K/uL   MPV 8.0  0 - 99.8 fL  POCT UA - MICROSCOPIC ONLY      Component Value Range   WBC, Ur, HPF, POC 0-1     RBC, urine, microscopic neg     Bacteria, U Microscopic neg     Mucus, UA neg     Epithelial cells, urine per micros 0-4     Crystals, Ur, HPF, POC neg     Casts, Ur, LPF, POC neg     Yeast, UA neg    POCT URINALYSIS DIPSTICK      Component Value Range   Color, UA yellow     Clarity, UA clear     Glucose, UA neg     Bilirubin, UA neg     Ketones, UA neg     Spec Grav, UA 1.010     Blood, UA neg     pH, UA 6.0     Protein, UA neg     Urobilinogen, UA 0.2     Nitrite, UA neg     Leukocytes, UA  Negative        Assessment & Plan:   1. Abdominal pain, acute, epigastric  POCT CBC, POCT UA - Microscopic Only, POCT urinalysis dipstick, Comprehensive metabolic panel, Amylase, Lipase  2. Nausea     While her symptoms are similar to previous pancreatitis, and she has an elevated WBC, we agree to supportive care until the remaining labs are available.  If her pain worsens, she develops fever, is unable to control her nausea with Zofran she has at home, RTC or present to the ED.

## 2012-11-13 NOTE — Patient Instructions (Signed)
Use the Zofran that you have at home for your nausea.  Stay hydrated.  It is OK if you don't eat, but you MUST DRINK.  If your pain worsened, or you develop fever, vomiting, go to the emergency department or return here.

## 2012-11-14 ENCOUNTER — Encounter: Payer: Self-pay | Admitting: Physician Assistant

## 2012-11-14 LAB — LIPASE: Lipase: 22 U/L (ref 0–75)

## 2012-11-14 LAB — AMYLASE: Amylase: 50 U/L (ref 0–105)

## 2012-11-21 ENCOUNTER — Ambulatory Visit (INDEPENDENT_AMBULATORY_CARE_PROVIDER_SITE_OTHER): Payer: 59 | Admitting: Emergency Medicine

## 2012-11-21 ENCOUNTER — Encounter: Payer: Self-pay | Admitting: Physician Assistant

## 2012-11-21 ENCOUNTER — Ambulatory Visit: Payer: 59

## 2012-11-21 VITALS — BP 112/74 | HR 100 | Temp 98.2°F | Resp 16 | Ht 66.5 in | Wt 194.0 lb

## 2012-11-21 DIAGNOSIS — Z1231 Encounter for screening mammogram for malignant neoplasm of breast: Secondary | ICD-10-CM

## 2012-11-21 DIAGNOSIS — M545 Low back pain, unspecified: Secondary | ICD-10-CM

## 2012-11-21 DIAGNOSIS — Z1239 Encounter for other screening for malignant neoplasm of breast: Secondary | ICD-10-CM

## 2012-11-21 DIAGNOSIS — F988 Other specified behavioral and emotional disorders with onset usually occurring in childhood and adolescence: Secondary | ICD-10-CM | POA: Insufficient documentation

## 2012-11-21 DIAGNOSIS — K222 Esophageal obstruction: Secondary | ICD-10-CM | POA: Insufficient documentation

## 2012-11-21 DIAGNOSIS — E781 Pure hyperglyceridemia: Secondary | ICD-10-CM | POA: Insufficient documentation

## 2012-11-21 MED ORDER — OXYCODONE HCL ER 60 MG PO T12A: 60.0000 mg | EXTENDED_RELEASE_TABLET | Freq: Two times a day (BID) | ORAL | Status: DC

## 2012-11-21 MED ORDER — OXYCODONE HCL ER 10 MG PO T12A: 10.0000 mg | EXTENDED_RELEASE_TABLET | Freq: Two times a day (BID) | ORAL | Status: DC

## 2012-11-21 MED ORDER — AMPHETAMINE-DEXTROAMPHETAMINE 30 MG PO TABS: 15.0000 mg | ORAL_TABLET | Freq: Three times a day (TID) | ORAL | Status: DC

## 2012-11-21 MED ORDER — METAXALONE 800 MG PO TABS: 800.0000 mg | ORAL_TABLET | Freq: Three times a day (TID) | ORAL | Status: DC

## 2012-11-21 MED ORDER — LIDOCAINE 5 % EX PTCH: 1.0000 | MEDICATED_PATCH | CUTANEOUS | Status: DC

## 2012-11-21 MED ORDER — OXYCODONE HCL ER 30 MG PO T12A: 60.0000 mg | EXTENDED_RELEASE_TABLET | Freq: Two times a day (BID) | ORAL | Status: DC

## 2012-11-21 MED ORDER — MELOXICAM 15 MG PO TABS: 15.0000 mg | ORAL_TABLET | Freq: Every day | ORAL | Status: DC

## 2012-11-21 NOTE — Progress Notes (Signed)
The patient returned with the prescription for Oxycontin 60 mg 1 PO BID #60 given earlier today.  Her insurance won't pay anything for the early fill, and the Rx will cost $600 out-of-pocket, which she is not able to afford.  I spoke with the pharmacist who advises me that we will have to change the dose by at least 5 mg in either direction, and that she cannot guarantee the insurance will cover even that.  I discussed again with the patient, and we agree to  Increase the dose to 70 mg (30+30+10) BID and refer to the Pain Management.

## 2012-11-21 NOTE — Progress Notes (Signed)
Subjective:    Patient ID: [name], female    DOB: Jan 01, 1971, 42 y.o.   MRN: [id number]  HPI This 42 y.o. female presents for evaluation of back pain and to discuss our plan to tapering her off nartcotics.  Recall that after two years on Suboxone, she left the clinic in 07/2012.  I agreed to prescribe a long-acting narcotic pain medication in a tapering dose to d/c.  Unfortunately, she's had a few setbacks and has not been able to reduce the dose as rapidly as she'd hoped, and has even had to increase the dose due to prolonged withdrawal symptoms.  Back pain in the primary problem at this point, and isn't controlled adequately with the current dose. Entering the third week on this dose, but having to take a third dose most days. Not yet exhausted her supply of 60 mg Oxycontin.   11 doses of the 30 doses prescribed 11/04/12 are remaining.   Rescue doses of immediate release oxycodone doesn't do much.  The withdrawal symptoms are resolved at this dose and she denies nausea, vomiting, diarrhea, dizziness, diaphoresis.  Stretching and exercising haven't helped. She's not having radicular symptoms, weakness or paresthesias, not loss of bowel or bladder control.  She needs a refill of adderall for treatment of ADD.  Her current dose is working well without side effects.  She's been offered a job at [employer] she hopes to take.   Past Medical History  Diagnosis Date  . Anxiety   . Depression   . GERD (gastroesophageal reflux disease)   . Esophageal stricture     s/p dilatation x 2  . Hyperlipemia   . Hypertension   . History of alcohol abuse     no alcohol since 2004  . Migraine headache   . History of cocaine abuse     none since 2011, entered treatment  . Bipolar disorder   . Pancreatitis, acute 04/2011  . Obesity   . Nephrolithiasis     x 4    Past Surgical History  Procedure Date  . Appendectomy   . Cholecystectomy   . Abdominal hysterectomy   . Cesarean section     . Knee surgery 2010    leftx3  . Nasal septum surgery   . Carpal tunnel release 07/29/2012    Procedure: CARPAL TUNNEL RELEASE;  Surgeon: Eldred Manges, MD;  [facility];  Service: Orthopedics;  Laterality: Right;  Right carpal tunnel release    Prior to Admission medications   Medication Sig Start Date End Date Taking? Authorizing Provider  ALPRAZolam Prudy Feeler) 1 MG tablet Take 1 tablet (1 mg total) by mouth 3 (three) times daily as needed. For anxiety 11/11/12  Yes Lilton Pare S Dawnyel Leven, PA-C  amLODipine (NORVASC) 10 MG tablet Take 1 tablet (10 mg total) by mouth daily. 08/02/12  Yes Briya Lookabaugh S Yaxiel Minnie, PA-C  amphetamine-dextroamphetamine (ADDERALL) 30 MG tablet Take 0.5 tablets (15 mg total) by mouth 3 (three) times daily. 11/21/12  Yes Daisee Centner S Berlyn Malina, PA-C  cloNIDine (CATAPRES) 0.2 MG tablet Take 0.5 tablets (0.1 mg total) by mouth 3 (three) times daily as needed (withdrawl symptoms). 10/23/12  Yes Eleanore E Debbra Riding, PA-C  EPINEPHrine (EPIPEN) 0.3 mg/0.3 mL DEVI Inject 0.3 mLs (0.3 mg total) into the muscle as needed (inject at time of signs of anaphylaxis). 06/04/12  Yes Tatyana A Kirichenko, PA  LORazepam (ATIVAN) 1 MG tablet Take 1-2 tablets (1-2 mg total) by mouth every 8 (eight) hours as  needed for anxiety. 10/23/12  Yes Kurstin Dimarzo S Muhammad Vacca, PA-C  oxyCODONE (OXY IR/ROXICODONE) 5 MG immediate release tablet Take 1 tablet (5 mg total) by mouth every 4 (four) hours as needed for pain ((breakthrough)). 11/07/12  Yes Terrilyn Tyner S Arlynn Mcdermid, PA-C  OxyCODONE HCl ER (OXYCONTIN) 60 MG T12A Take 60 mg by mouth every 12 (twelve) hours. 11/04/12  Yes Kilah Drahos S Mahad Newstrom, PA-C  sertraline (ZOLOFT) 100 MG tablet TAKE 1 TABLET BY MOUTH ONCE DAILY 09/26/12  Yes Nelva Nay, PA-C    Allergies  Allergen Reactions  . Imitrex (Sumatriptan Base) Shortness Of Breath  . Metoclopramide Hcl Itching  . Morphine And Related Itching  . Tegretol (Carbamazepine) Itching  . Topamax Itching  . Trazodone And  Nefazodone Other (See Comments)    Nightmares    History   Social History  . Marital Status: Significant Other    Spouse Name: Dustin Flock    Number of Children: 2  . Years of Education: N/A   Occupational History  . CNA    Social History Main Topics  . Smoking status: Current Every Day Smoker -- 1.0 packs/day for 20 years    Types: Cigarettes  . Smokeless tobacco: Never Used  . Alcohol Use: No     Comment: none since 2004  . Drug Use: No     Comment: none since 2011  . Sexually Active: Yes -- Female partner(s)   Other Topics Concern  . Not on file   Social History Narrative   3 caffeine drinks daily. She has applied for a position with [employer] and ultimately hopes to work for [employer].  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    Family History  Problem Relation Age of Onset  . Colon cancer Neg Hx   . Heart disease Maternal Grandmother   . Kidney cancer Mother   . Hypertension Mother   . Cancer Mother     kidney  . Lung cancer Maternal Aunt   . Stroke Maternal Uncle 55  . Alcohol abuse Maternal Uncle     recovered  . Heart disease Maternal Uncle     Review of Systems As above.    Objective:   Physical Exam  Blood pressure 112/74, pulse 100, temperature 98.2 F (36.8 C), temperature source Oral, resp. rate 16, height 5' 6.5" (1.689 m), weight 194 lb (87.998 kg), SpO2 95.00%. Body mass index is 30.84 kg/(m^2). Well-developed, well nourished WF who is awake, alert and oriented, in NAD. Her partner accompanies her today. HEENT: /AT, PERRL, EOMI.  Sclera and conjunctiva are clear.   Neck: supple, non-tender, no lymphadenopathy, thyromegaly. Heart: RRR, no murmur Lungs: normal effort, CTA Back: Tenderness over the mid-lumbar vertebra, and mild lumbar paraspinous muscle tenderness bilaterally.  FROM. Neurologic: CN II-XII are intact.  Normal sensation of the upper and lower extremities and face.  DTRs are symmetrically strong.  Good strength of the upper and lower extremities. Extremities: no cyanosis, clubbing or edema. Skin: warm and dry without rash. Psychologic: mildly dysphoric mood and appropriate affect, normal speech and behavior.  LS-Spine: [facility] reading (PRIMARY) by  Dr. Dareen Piano.  Good alignment and normal disc spaces.  Mild anterior spurring at L4 and L5.       Assessment & Plan:   1. Lumbar back pain  DG Lumbar Spine Complete, metaxalone (SKELAXIN) 800 MG tablet, lidocaine (LIDODERM) 5 %, OxyCODONE HCl ER (OXYCONTIN) 60 MG T12A, meloxicam (MOBIC) 15 MG tablet  2. ADD (attention  deficit disorder)  amphetamine-dextroamphetamine (ADDERALL) 30 MG tablet  3. Screening for breast cancer  MM Digital Screening   I provided a new Rx for 60 mg BID, with notation that it's an early fill due to increased dose to cover escalating pain.  If her pain is not controlled with BID dosing along with the skelaxin, meloxicam and lidocaine patch x 12 hours/day, we'll refer to neurosurgery  for re-evaluation, and if there is no surgical intervention then proceed to Pain Management. She and her partner are in agreement.

## 2012-11-22 ENCOUNTER — Encounter: Payer: Self-pay | Admitting: Physician Assistant

## 2012-11-25 ENCOUNTER — Encounter: Payer: Self-pay | Admitting: Physician Assistant

## 2012-11-25 ENCOUNTER — Other Ambulatory Visit: Payer: Self-pay | Admitting: Physician Assistant

## 2012-11-25 MED ORDER — OXYCODONE HCL ER 30 MG PO T12A: 60.0000 mg | EXTENDED_RELEASE_TABLET | Freq: Two times a day (BID) | ORAL | Status: DC

## 2012-11-26 ENCOUNTER — Other Ambulatory Visit: Payer: Self-pay | Admitting: Physician Assistant

## 2012-12-02 ENCOUNTER — Ambulatory Visit (INDEPENDENT_AMBULATORY_CARE_PROVIDER_SITE_OTHER): Payer: 59 | Admitting: Family Medicine

## 2012-12-02 VITALS — BP 130/85 | HR 94 | Temp 97.8°F | Resp 20 | Ht 66.0 in | Wt 192.4 lb

## 2012-12-02 DIAGNOSIS — M519 Unspecified thoracic, thoracolumbar and lumbosacral intervertebral disc disorder: Secondary | ICD-10-CM

## 2012-12-02 MED ORDER — OXYCODONE-ACETAMINOPHEN 10-325 MG PO TABS: 1.0000 | ORAL_TABLET | Freq: Four times a day (QID) | ORAL | Status: DC | PRN

## 2012-12-02 NOTE — Progress Notes (Signed)
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  Pain is currently having 7/10 pain along her beltline  Past Rx:  TENS unit, lidocaine patches.    Objective:  NAD Good ankle reflexes Good strength in legs Patient is a from a chair easily and has a normal gait.  Assessment: I told patient not comfortable with refilling OxyContin. As I would do some oxycodone temporarily while she got set up for her MRI and other referrals. 1. Lumbar disc disease  Ambulatory referral to Neurosurgery, MR Lumbar Spine Wo Contrast, oxyCODONE-acetaminophen (PERCOCET) 10-325 MG per tablet, Ambulatory referral to Pain Clinic

## 2012-12-11 ENCOUNTER — Other Ambulatory Visit: Payer: Self-pay | Admitting: Physician Assistant

## 2012-12-12 ENCOUNTER — Ambulatory Visit (INDEPENDENT_AMBULATORY_CARE_PROVIDER_SITE_OTHER): Payer: 59 | Admitting: Emergency Medicine

## 2012-12-12 VITALS — BP 117/76 | HR 118 | Temp 98.5°F | Resp 16 | Ht 66.5 in | Wt 190.8 lb

## 2012-12-12 DIAGNOSIS — M545 Low back pain, unspecified: Secondary | ICD-10-CM

## 2012-12-12 DIAGNOSIS — M519 Unspecified thoracic, thoracolumbar and lumbosacral intervertebral disc disorder: Secondary | ICD-10-CM

## 2012-12-12 MED ORDER — OXYCODONE HCL ER 30 MG PO T12A: 60.0000 mg | EXTENDED_RELEASE_TABLET | Freq: Two times a day (BID) | ORAL | Status: DC

## 2012-12-12 MED ORDER — OXYCODONE-ACETAMINOPHEN 10-325 MG PO TABS: 1.0000 | ORAL_TABLET | Freq: Four times a day (QID) | ORAL | Status: DC | PRN

## 2012-12-12 MED ORDER — OXYCODONE HCL 40 MG PO TB12: 80.0000 mg | ORAL_TABLET | Freq: Two times a day (BID) | ORAL | Status: DC

## 2012-12-12 NOTE — Progress Notes (Signed)
  Subjective:    Patient ID: [name], female    DOB: 07-25-1971, 42 y.o.   MRN: [id number]  HPI Pt here today to talk about her pain medication. She has an appointment with [facility] on Feb 18. Her last MRI was 3 years ago, showing a bulging disc. She wants to proceed with MRI while she waits on her appointment. She has lower back pain which runs across her belt line. She is taking Meloxicam and Skelaxin for some relief but needs a refill on her Oxycontin until her appointment with Pain Management. She states she was on Oxycontin 70mg  twice daily.     Review of Systems     Objective:   Physical Exam There is tenderness over the lower lumbar spine. She did not have pain on straight leg raising. Deep tendon reflexes were 2+ and symmetrical. Motor strength was symmetrical       Assessment & Plan:  MRI was reordered. She does have an appointment already made at pain management. She was given medications to last for 2 weeks. She was given a total of  #60 tablets of OxyContin 30 mg extended release and she was given a prescription for #40  Percocet 10 to take for breakthrough pain

## 2012-12-16 ENCOUNTER — Ambulatory Visit (INDEPENDENT_AMBULATORY_CARE_PROVIDER_SITE_OTHER): Payer: 59 | Admitting: Physician Assistant

## 2012-12-16 VITALS — BP 138/90 | HR 112 | Temp 97.5°F | Resp 16 | Ht 66.5 in | Wt 194.0 lb

## 2012-12-16 DIAGNOSIS — F411 Generalized anxiety disorder: Secondary | ICD-10-CM

## 2012-12-16 DIAGNOSIS — M545 Low back pain, unspecified: Secondary | ICD-10-CM

## 2012-12-16 DIAGNOSIS — F419 Anxiety disorder, unspecified: Secondary | ICD-10-CM

## 2012-12-16 DIAGNOSIS — F19939 Other psychoactive substance use, unspecified with withdrawal, unspecified: Secondary | ICD-10-CM

## 2012-12-16 MED ORDER — ALPRAZOLAM 1 MG PO TABS: 1.0000 mg | ORAL_TABLET | Freq: Three times a day (TID) | ORAL | Status: DC | PRN

## 2012-12-16 MED ORDER — OXYCODONE HCL ER 80 MG PO T12A: 80.0000 mg | EXTENDED_RELEASE_TABLET | Freq: Two times a day (BID) | ORAL | Status: DC

## 2012-12-16 MED ORDER — OXYCODONE-ACETAMINOPHEN 10-325 MG PO TABS: 1.0000 | ORAL_TABLET | Freq: Four times a day (QID) | ORAL | Status: DC | PRN

## 2012-12-16 NOTE — Patient Instructions (Addendum)
Call [place] at [facility] [phone]) to schedule the MRI (referral was sent 1/27 and 2/06). Dr. Danielle Dess won't schedule until the MRI is done.

## 2012-12-16 NOTE — Progress Notes (Signed)
  Subjective:    Patient ID: [name], female    DOB: 04/21/1971, 42 y.o.   MRN: [id number]  HPI This 42 y.o. female presents for evaluation of chronic low back pain and withdrawal syndrome.  See my previous notes and Dr. Ellis Parents note 12/12/2012.  Appointment with Pain Management is 12/24/2012. Referrals in for MRI of the lumbar spine (MRI 3 years ago revealed a bulging disc-the report has been sent for scanning and is not presently available) and to Dr. Danielle Dess for re-evaluation (previously evaluated elsewhere), but the MRI is required before she can schedule with neurosurgery.  Patient Active Problem List  Diagnosis  . Abdominal pain, epigastric  . Migraine headache  . Depression  . History of substance abuse  . Hypertension  . Constipation  . OSA (obstructive sleep apnea)  . Carpal tunnel syndrome, bilateral  . Pain  . Lumbar back pain  . Esophageal stricture  . ADD (attention deficit disorder)  . Hypertriglyceridemia   Allergies  Allergen Reactions  . Imitrex (Sumatriptan Base) Shortness Of Breath  . Metoclopramide Hcl Itching  . Morphine And Related Itching  . Tegretol (Carbamazepine) Itching  . Topamax Itching  . Trazodone And Nefazodone Other (See Comments)    Nightmares    Review of Systems She's having pain in the low back, along the waistline without radiculopathy.  Has experienced some anxiety with her upcoming Pain Management visit, and some withdrawal symptoms of nausea, diaphoresis, dizziness.    Objective:   Physical Exam Blood pressure 138/90, pulse 112, temperature 97.5 F (36.4 C), temperature source Oral, resp. rate 16, height 5' 6.5" (1.689 m), weight 194 lb (87.998 kg), SpO2 100.00%. Body mass index is 30.85 kg/(m^2). Well-developed, well nourished WF who is awake, alert and oriented, in NAD. HEENT: [place]/AT, sclera and conjunctiva are clear.   Neck: supple, non-tender, no lymphadenopathy, thyromegaly. Heart: RRR, no murmur Lungs: normal effort,  CTA Back: Normal appearance.  Tenderness of the lumbar spine and bilaterally along the waistline. Extremities: no cyanosis, clubbing or edema. Normal LE sensation and DTRs. Skin: warm and dry without rash. Psychologic: good mood and appropriate affect, normal speech and behavior.    Assessment & Plan:   1. Lumbar back pain  oxyCODONE-acetaminophen (PERCOCET) 10-325 MG per tablet   OxyCODONE (OXYCONTIN) 80 mg T12A  2. Withdrawal syndrome    3. Anxiety  ALPRAZolam (XANAX) 1 MG tablet   She'll proceed with the appointment with Pain Management on 2/18.  She's going to have to advocate for herself with regard to her desire to get pain control and then reduce her dose using non-narcotic, possibly non-medication treatment options.  At this point she is open to any possibility.  Specifically, she's going to get clear on whether they or I will continue to treat her anxiety and ADD, given the controlled substances used for those.  She will contact [facility] regarding the scheduling of the MRI (referral has been made).  She has a history of difficulty in the closed scanner.  I am comfortable writing her for extra alprazolam or Valium for that, but we agree to wait until we have a date to prevent the prescription or tablets getting lost/misplaced.  Once the MRI is complete, Dr. Danielle Dess will review and advise [place] if she is a candidate for any treatments he can offer.  Once the above is settled, we'll follow up here regarding ADD, Anxiety/Depression, lipids, BP, etc.

## 2012-12-24 ENCOUNTER — Ambulatory Visit (INDEPENDENT_AMBULATORY_CARE_PROVIDER_SITE_OTHER): Payer: 59 | Admitting: Physician Assistant

## 2012-12-24 ENCOUNTER — Telehealth: Payer: Self-pay | Admitting: Radiology

## 2012-12-24 VITALS — BP 121/82 | HR 93 | Temp 97.7°F | Resp 18 | Ht 66.0 in | Wt 196.0 lb

## 2012-12-24 DIAGNOSIS — F411 Generalized anxiety disorder: Secondary | ICD-10-CM

## 2012-12-24 DIAGNOSIS — M545 Low back pain, unspecified: Secondary | ICD-10-CM

## 2012-12-24 DIAGNOSIS — F19939 Other psychoactive substance use, unspecified with withdrawal, unspecified: Secondary | ICD-10-CM

## 2012-12-24 DIAGNOSIS — F419 Anxiety disorder, unspecified: Secondary | ICD-10-CM

## 2012-12-24 MED ORDER — OXYCODONE-ACETAMINOPHEN 10-325 MG PO TABS: 1.0000 | ORAL_TABLET | ORAL | Status: DC | PRN

## 2012-12-24 MED ORDER — OXYCODONE HCL ER 60 MG PO T12A: 60.0000 mg | EXTENDED_RELEASE_TABLET | Freq: Two times a day (BID) | ORAL | Status: DC

## 2012-12-24 MED ORDER — OXYCODONE HCL ER 15 MG PO T12A: 15.0000 mg | EXTENDED_RELEASE_TABLET | Freq: Two times a day (BID) | ORAL | Status: DC

## 2012-12-24 NOTE — Telephone Encounter (Signed)
[name], can you see if we can resend the pain management referral to [facility] for evaluation?

## 2012-12-24 NOTE — Progress Notes (Signed)
Subjective:    Patient ID: [name], female    DOB: 1970/11/16, 42 y.o.   MRN: [id number]  HPI This 42 y.o. female presents for evaluation of chronic lumbar pain, history of substance abuse, withdrawal syndrome, and anxiety. Is in the process of scheduling the MRI of the lumbar spine. Recall that neurosurgery won't schedule her until they can review the MRI. Saw Pain Management earlier today. Dr. Wyline Beady, MD at the [facility]. Was advised that, given her history of substance abuse, he was only willing to prescribe suboxone and implant a spinal cord stimulator.  She has been very clear that she doesn't want to use suboxone again, but he was unwilling to consider other pharmacologics. Before considering spinal cord stimulator, she is interested in a second opinion. She reports that she has taken extra Oxycontin 80 mg tabs since her visit with me 12/16/2012.  Given her experience today, she is willing to "do whatever you say."  "I'll deal with whatever, even if you reduce the dose every week.  I'll just get through it."  "Maybe this was the wake-up call I needed."  Past Medical History  Diagnosis Date  . Anxiety   . Depression   . GERD (gastroesophageal reflux disease)   . Esophageal stricture     s/p dilatation x 2  . Hyperlipemia   . Hypertension   . History of alcohol abuse     no alcohol since 2004  . Migraine headache   . History of cocaine abuse     none since 2011, entered treatment  . Bipolar disorder   . Pancreatitis, acute 04/2011  . Obesity   . Nephrolithiasis     x 4    Past Surgical History  Procedure Laterality Date  . Appendectomy    . Cholecystectomy    . Abdominal hysterectomy    . Cesarean section    . Knee surgery  2010    leftx3  . Nasal septum surgery    . Carpal tunnel release  07/29/2012    Procedure: CARPAL TUNNEL RELEASE;  Surgeon: Eldred Manges, MD;  Location: [facility];  Service: Orthopedics;  Laterality: Right;  Right carpal  tunnel release    Prior to Admission medications   Medication Sig Start Date End Date Taking? Authorizing Provider  ALPRAZolam Prudy Feeler) 1 MG tablet Take 1 tablet (1 mg total) by mouth 3 (three) times daily as needed. For anxiety 12/16/12  Yes Azlaan Isidore S [place] Holian, PA-C  amLODipine (NORVASC) 10 MG tablet Take 1 tablet (10 mg total) by mouth daily. 08/02/12  Yes Carmina Walle S Omarie Parcell, PA-C  amphetamine-dextroamphetamine (ADDERALL) 30 MG tablet Take 0.5 tablets (15 mg total) by mouth 3 (three) times daily. 11/21/12  Yes Tyreisha Ungar S Aleksander Edmiston, PA-C  cloNIDine (CATAPRES) 0.1 MG tablet TAKE 1 TABLET BY MOUTH THREE TIMES DAILY AS NEEDED FOR WITHDRAWL SYMPTOMS 12/11/12  Yes Heather M Marte, PA-C  EPINEPHrine (EPIPEN) 0.3 mg/0.3 mL DEVI Inject 0.3 mLs (0.3 mg total) into the muscle as needed (inject at time of signs of anaphylaxis). 06/04/12  Yes Tatyana A Kirichenko, PA  lidocaine (LIDODERM) 5 % Place 1 patch onto the skin daily. Remove & Discard patch within 12 hours or as directed by MD 11/21/12  Yes Bernadine Melecio S Chandler Stofer, PA-C  LORazepam (ATIVAN) 1 MG tablet Take 1-2 tablets (1-2 mg total) by mouth every 8 (eight) hours as needed for anxiety. 10/23/12  Yes Kyree Fedorko S Vedanth Sirico, PA-C  meloxicam (MOBIC) 15 MG tablet Take 1 tablet (15  mg total) by mouth daily. 11/21/12  Yes Kendal Ghazarian S Kaspian Muccio, PA-C  metaxalone (SKELAXIN) 800 MG tablet Take 1 tablet (800 mg total) by mouth 3 (three) times daily. 11/21/12  Yes Savaya Hakes S Clayten Allcock, PA-C  OxyCODONE (OXYCONTIN) 80 mg T12A Take 1 tablet (80 mg total) by mouth every 12 (twelve) hours. 12/16/12 12/31/12 Yes Noga Fogg S Laraya Pestka, PA-C  oxyCODONE-acetaminophen (PERCOCET) 10-325 MG per tablet Take 1 tablet by mouth every 6 (six) hours as needed for pain. 12/16/12  Yes Rael Yo S Tonga Prout, PA-C  sertraline (ZOLOFT) 100 MG tablet TAKE 1 TABLET BY MOUTH ONCE DAILY 09/26/12  Yes Heather M Marte, PA-C  cloNIDine (CATAPRES) 0.2 MG tablet Take 0.5 tablets (0.1 mg total) by mouth 3 (three) times daily as needed (withdrawl  symptoms). 10/23/12   Godfrey Pick, PA-C    Allergies  Allergen Reactions  . Imitrex (Sumatriptan Base) Shortness Of Breath  . Metoclopramide Hcl Itching  . Morphine And Related Itching  . Tegretol (Carbamazepine) Itching  . Topamax Itching  . Trazodone And Nefazodone Other (See Comments)    Nightmares    History   Social History  . Marital Status: Significant Other    Spouse Name: Dustin Flock    Number of Children: 2  . Years of Education: N/A   Occupational History  . CNA    Social History Main Topics  . Smoking status: Current Every Day Smoker -- 1.00 packs/day for 20 years    Types: Cigarettes  . Smokeless tobacco: Never Used  . Alcohol Use: No     Comment: none since 2004  . Drug Use: No     Comment: none since 2011  . Sexually Active: Yes -- Female partner(s)   Other Topics Concern  . Not on file   Social History Narrative   3 caffeine drinks daily       Was recruited to [employer], with a full scholarship, for undergraduate studies, but had to decline due to family problems.  She then planned to attend [employer] ([employer]) but wasn't able to work and attend courses, so went to [employer] and studied Production designer, theatre/television/film.      Has dropped out of the substance abuse counseling program she was enrolled in, having figured out that it's not a career path she wants to continue.  She has applied for a position with [employer] and ultimately hopes to work for [employer].  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                Family History  Problem Relation Age of Onset  . Colon cancer Neg Hx   . Heart disease Maternal Grandmother   . Kidney cancer Mother   . Hypertension Mother   . Cancer Mother     kidney  . Lung cancer Maternal Aunt   . Stroke Maternal Uncle 55  . Alcohol abuse Maternal Uncle     recovered  . Heart disease Maternal Uncle      Review of Systems Low back pain as above.  No LE paresthesias, numbness, weakness. No  loss of bowel/bladder control.  Very anxious.      Objective:   Physical Exam Blood pressure 121/82, pulse 93, temperature 97.7 F (36.5 C), temperature source Oral, resp. rate 18, height 5\' 6"  (1.676 m), weight 196 lb (88.905 kg), SpO2 97.00%. Body mass index is 31.65 kg/(m^2). Well-developed, well nourished WF who is awake, alert and oriented, in NAD, but is  obviously nervous. HEENT: [place]/AT, sclera and conjunctiva are clear.  Neck: supple, non-tender, no lymphadenopathy, thyromegaly. Lungs: normal effort Extremities: no cyanosis, clubbing or edema. Skin: warm and dry without rash. Psychologic: good mood and appropriate affect, normal speech and behavior.     Assessment & Plan:  Lumbar back pain - Plan: OxyCODONE HCl ER 60 MG T12A, OxyCODONE (OXYCONTIN) 15 mg T12A, oxyCODONE-acetaminophen (PERCOCET) 10-325 MG per tablet  Withdrawal syndrome  Anxiety  We agree to see if another pain management clinic will see her for an opinion.  Proceed with MRI scheduling.  I will prescribe pain meds weekly, with visits Q1-2 weeks, until she can get another opinion.  If for some reason, she cannot be seen by Pain Management due to requesting a second opinion, I will re-address a plan with her to taper her off, using small increments/dispenses to reduce the likelihood of over-/misuse.

## 2012-12-24 NOTE — Patient Instructions (Signed)
If you have not heard from our office or the [facility] office by Friday, please contact me. Schedule the MRI at your earliest convenience. Continue using the TENS unit and lidoderm patches. Find a pool where you can swim or enroll in a water aerobics class.

## 2012-12-24 NOTE — Progress Notes (Signed)
I called [facility] because patient was given information about Vestibular rehabilitation program. However patient was not complaining of trouble regarding this. I have been advised by the receptionist at the clinic all new patients are given this information. They are all given a test for this at new patient appointment, and given general information regarding the program.

## 2012-12-25 ENCOUNTER — Other Ambulatory Visit: Payer: Self-pay | Admitting: Physician Assistant

## 2012-12-30 ENCOUNTER — Encounter: Payer: Self-pay | Admitting: Physician Assistant

## 2012-12-30 ENCOUNTER — Other Ambulatory Visit: Payer: Self-pay | Admitting: Physician Assistant

## 2012-12-30 DIAGNOSIS — F19939 Other psychoactive substance use, unspecified with withdrawal, unspecified: Secondary | ICD-10-CM

## 2012-12-30 DIAGNOSIS — F988 Other specified behavioral and emotional disorders with onset usually occurring in childhood and adolescence: Secondary | ICD-10-CM

## 2012-12-30 DIAGNOSIS — M545 Low back pain, unspecified: Secondary | ICD-10-CM

## 2012-12-30 MED ORDER — OXYCODONE HCL ER 60 MG PO T12A: 60.0000 mg | EXTENDED_RELEASE_TABLET | Freq: Two times a day (BID) | ORAL | Status: DC

## 2012-12-30 MED ORDER — LORAZEPAM 1 MG PO TABS: 1.0000 mg | ORAL_TABLET | Freq: Three times a day (TID) | ORAL | Status: DC | PRN

## 2012-12-30 MED ORDER — OXYCODONE HCL ER 10 MG PO T12A: 10.0000 mg | EXTENDED_RELEASE_TABLET | Freq: Two times a day (BID) | ORAL | Status: DC

## 2012-12-30 MED ORDER — AMPHETAMINE-DEXTROAMPHETAMINE 30 MG PO TABS: 15.0000 mg | ORAL_TABLET | Freq: Three times a day (TID) | ORAL | Status: DC

## 2013-01-01 ENCOUNTER — Encounter: Payer: Self-pay | Admitting: Physician Assistant

## 2013-01-01 ENCOUNTER — Telehealth: Payer: Self-pay | Admitting: Family Medicine

## 2013-01-01 DIAGNOSIS — M545 Low back pain, unspecified: Secondary | ICD-10-CM

## 2013-01-01 NOTE — Telephone Encounter (Signed)
[name]. hate to bother but could I get more breakthrough meds...also my next oxy dose of 65 will be due Mon am is there a way I can get those Mon am before work like if u leave them ready but not to b filled until Monday? thanx Sharina         Received via patient advice request

## 2013-01-02 ENCOUNTER — Other Ambulatory Visit: Payer: Self-pay | Admitting: Physician Assistant

## 2013-01-02 ENCOUNTER — Telehealth: Payer: Self-pay | Admitting: Physician Assistant

## 2013-01-02 DIAGNOSIS — M545 Low back pain, unspecified: Secondary | ICD-10-CM

## 2013-01-02 MED ORDER — OXYCODONE HCL ER 20 MG PO T12A: 20.0000 mg | EXTENDED_RELEASE_TABLET | Freq: Two times a day (BID) | ORAL | Status: DC

## 2013-01-02 MED ORDER — OXYCODONE HCL ER 15 MG PO T12A: 15.0000 mg | EXTENDED_RELEASE_TABLET | Freq: Two times a day (BID) | ORAL | Status: DC

## 2013-01-02 MED ORDER — OXYCODONE-ACETAMINOPHEN 10-325 MG PO TABS: 1.0000 | ORAL_TABLET | ORAL | Status: DC | PRN

## 2013-01-02 MED ORDER — OXYCODONE HCL ER 30 MG PO T12A: 30.0000 mg | EXTENDED_RELEASE_TABLET | Freq: Two times a day (BID) | ORAL | Status: DC

## 2013-01-02 MED ORDER — OXYCODONE HCL ER 40 MG PO T12A: 40.0000 mg | EXTENDED_RELEASE_TABLET | Freq: Two times a day (BID) | ORAL | Status: DC

## 2013-01-02 NOTE — Telephone Encounter (Signed)
This patient has an upcoming MRI, but request that it be rescheduled at either [facility] or [facility], due to the reduced cost for them at a hospital facility.

## 2013-01-02 NOTE — Telephone Encounter (Signed)
I've printed the prescriptions, and will sign them when I come in today at 1 for clinic.  I've let the patinet know via My Chart.  Meds ordered this encounter  Medications  . oxyCODONE-acetaminophen (PERCOCET) 10-325 MG per tablet    Sig: Take 1 tablet by mouth every 4 (four) hours as needed for pain (breakthrough).    Dispense:  40 tablet    Refill:  0    Order Specific Question:  Supervising Provider    Answer:  DOOLITTLE, ROBERT P [[id number]]  . OxyCODONE (OXYCONTIN) 40 mg T12A    Sig: Take 1 tablet (40 mg total) by mouth every 12 (twelve) hours. Take with 15 mg to achieve 65 mg total dose.    Dispense:  60 tablet    Refill:  0    Order Specific Question:  Supervising Provider    Answer:  DOOLITTLE, ROBERT P [[id number]]  . OxyCODONE (OXYCONTIN) 15 mg T12A    Sig: Take 1 tablet (15 mg total) by mouth every 12 (twelve) hours. Take with 40 mg to achieve 65 mg total dose.    Dispense:  60 tablet    Refill:  0    Order Specific Question:  Supervising Provider    Answer:  DOOLITTLE, ROBERT P [[id number]]

## 2013-01-07 ENCOUNTER — Ambulatory Visit: Payer: 59

## 2013-01-07 ENCOUNTER — Ambulatory Visit (INDEPENDENT_AMBULATORY_CARE_PROVIDER_SITE_OTHER): Payer: 59 | Admitting: Emergency Medicine

## 2013-01-07 VITALS — BP 133/90 | HR 84 | Temp 98.2°F | Resp 16 | Ht 66.5 in | Wt 191.0 lb

## 2013-01-07 DIAGNOSIS — M25532 Pain in left wrist: Secondary | ICD-10-CM

## 2013-01-07 DIAGNOSIS — M25561 Pain in right knee: Secondary | ICD-10-CM

## 2013-01-07 DIAGNOSIS — M2390 Unspecified internal derangement of unspecified knee: Secondary | ICD-10-CM

## 2013-01-07 DIAGNOSIS — S63509A Unspecified sprain of unspecified wrist, initial encounter: Secondary | ICD-10-CM

## 2013-01-07 DIAGNOSIS — M25569 Pain in unspecified knee: Secondary | ICD-10-CM

## 2013-01-07 DIAGNOSIS — M25539 Pain in unspecified wrist: Secondary | ICD-10-CM

## 2013-01-07 MED ORDER — NAPROXEN SODIUM 550 MG PO TABS: 550.0000 mg | ORAL_TABLET | Freq: Two times a day (BID) | ORAL | Status: DC

## 2013-01-07 NOTE — Progress Notes (Signed)
[facility] [address], [place] [address] [phone]  Date:  01/07/2013   Name:  [name]   DOB:  Aug 31, 1971   MRN:  [id number]  PCP:  JEFFERY,[name], PA-C    Chief Complaint: Fall   History of Present Illness:  [name] is a 42 y.o. very pleasant female patient who presents with the following:  Tripped today climbing over a gate and fell.  Landed on her left outstretched wrist and right knee.  Has pain with standing and ambulation in her knee.  Denies other injury.  No improvement with over the counter medications or other home remedies.   Patient Active Problem List  Diagnosis  . Abdominal pain, epigastric  . Migraine headache  . Depression  . History of substance abuse  . Hypertension  . Constipation  . OSA (obstructive sleep apnea)  . Carpal tunnel syndrome, bilateral  . Pain  . Lumbar back pain  . Esophageal stricture  . ADD (attention deficit disorder)  . Hypertriglyceridemia    Past Medical History  Diagnosis Date  . Anxiety   . Depression   . GERD (gastroesophageal reflux disease)   . Esophageal stricture     s/p dilatation x 2  . Hyperlipemia   . Hypertension   . History of alcohol abuse     no alcohol since 2004  . Migraine headache   . History of cocaine abuse     none since 2011, entered treatment  . Bipolar disorder   . Pancreatitis, acute 04/2011  . Obesity   . Nephrolithiasis     x 4    Past Surgical History  Procedure Laterality Date  . Appendectomy    . Cholecystectomy    . Abdominal hysterectomy    . Cesarean section    . Knee surgery  2010    leftx3  . Nasal septum surgery    . Carpal tunnel release  07/29/2012    Procedure: CARPAL TUNNEL RELEASE;  Surgeon: Eldred Manges, MD;  Location: [facility];  Service: Orthopedics;  Laterality: Right;  Right carpal tunnel release    History  Substance Use Topics  . Smoking status: Current Every Day Smoker -- 1.00 packs/day for 20 years     Types: Cigarettes  . Smokeless tobacco: Never Used  . Alcohol Use: No     Comment: none since 2004    Family History  Problem Relation Age of Onset  . Colon cancer Neg Hx   . Heart disease Maternal Grandmother   . Kidney cancer Mother   . Hypertension Mother   . Cancer Mother     kidney  . Lung cancer Maternal Aunt   . Stroke Maternal Uncle 55  . Alcohol abuse Maternal Uncle     recovered  . Heart disease Maternal Uncle     Allergies  Allergen Reactions  . Imitrex (Sumatriptan Base) Shortness Of Breath  . Metoclopramide Hcl Itching  . Morphine And Related Itching  . Tegretol (Carbamazepine) Itching  . Topamax Itching  . Trazodone And Nefazodone Other (See Comments)    Nightmares    Medication list has been reviewed and updated.  Current Outpatient Prescriptions on File Prior to Visit  Medication Sig Dispense Refill  . ALPRAZolam (XANAX) 1 MG tablet Take 1 tablet (1 mg total) by mouth 3 (three) times daily as needed. For anxiety  90 tablet  0  . amLODipine (NORVASC) 10 MG tablet Take 1 tablet (10 mg  total) by mouth daily.  90 tablet  1  . amphetamine-dextroamphetamine (ADDERALL) 30 MG tablet Take 0.5 tablets (15 mg total) by mouth 3 (three) times daily.  45 tablet  0  . cloNIDine (CATAPRES) 0.1 MG tablet TAKE 1 TABLET BY MOUTH THREE TIMES DAILY AS NEEDED FOR WITHDRAWL SYMPTOMS  30 tablet  0  . EPINEPHrine (EPIPEN) 0.3 mg/0.3 mL DEVI Inject 0.3 mLs (0.3 mg total) into the muscle as needed (inject at time of signs of anaphylaxis).  2 Device  0  . lidocaine (LIDODERM) 5 % Place 1 patch onto the skin daily. Remove & Discard patch within 12 hours or as directed by MD  30 patch  0  . LORazepam (ATIVAN) 1 MG tablet Take 1-2 tablets (1-2 mg total) by mouth every 8 (eight) hours as needed for anxiety.  30 tablet  0  . meloxicam (MOBIC) 15 MG tablet Take 1 tablet (15 mg total) by mouth daily.  30 tablet  1  . metaxalone (SKELAXIN) 800 MG tablet Take 1 tablet (800 mg total) by mouth 3  (three) times daily.  90 tablet  0  . OxyCODONE (OXYCONTIN) 15 mg T12A Take 1 tablet (15 mg total) by mouth every 12 (twelve) hours. Take with 20 mg AND 30 mg to achieve 65 mg total dose.  60 tablet  0  . OxyCODONE (OXYCONTIN) 20 mg T12A Take 1 tablet (20 mg total) by mouth every 12 (twelve) hours. Take with 30 mg AND 15 mg to achieve 65 mg dose  60 tablet  0  . OxyCODONE HCl ER 30 MG T12A Take 30 mg by mouth every 12 (twelve) hours. Take with 20 mg AND 15 mg to achieve 65 mg dose  60 each  0  . oxyCODONE-acetaminophen (PERCOCET) 10-325 MG per tablet Take 1 tablet by mouth every 4 (four) hours as needed for pain (breakthrough).  40 tablet  0  . pantoprazole (PROTONIX) 40 MG tablet TAKE 1 TABLET BY MOUTH ONCE DAILY  90 tablet  0  . sertraline (ZOLOFT) 100 MG tablet TAKE 1 TABLET BY MOUTH ONCE DAILY  30 tablet  5  . cloNIDine (CATAPRES) 0.2 MG tablet Take 0.5 tablets (0.1 mg total) by mouth 3 (three) times daily as needed (withdrawl symptoms).  30 tablet  0   No current facility-administered medications on file prior to visit.    Review of Systems:  As per HPI, otherwise negative.    Physical Examination: Filed Vitals:   01/07/13 1420  BP: 133/90  Pulse: 84  Temp: 98.2 F (36.8 C)  Resp: 16   Filed Vitals:   01/07/13 1420  Height: 5' 6.5" (1.689 m)  Weight: 191 lb (86.637 kg)   Body mass index is 30.37 kg/(m^2). Ideal Body Weight: Weight in (lb) to have BMI = 25: 156.9   GEN: WDWN, NAD, Non-toxic, Alert & Oriented x 3 HEENT: Atraumatic, Normocephalic.  Ears and Nose: No external deformity. EXTR: No clubbing/cyanosis/edema NEURO: Normal gait.  PSYCH: Normally interactive. Conversant. Not depressed or anxious appearing.  Calm demeanor.  RIGHT KNEE:  Some effusion and tenderness medial knee.  Too tender to evaluate stability. LEFT WRIST:  Tender left wrist at snuff box.  Assessment and Plan: Contusion wrist and knee Anaprox Wrist splint for 7 days Follow up as  needed  [facility] reading (PRIMARY) by  Dr. Dareen Piano.  Knee:  normal.  [facility] reading (PRIMARY) by  Dr. Dareen Piano.  Wrist.  normal.    Denise Dane, MD

## 2013-01-07 NOTE — Patient Instructions (Addendum)
Wrist Pain  Wrist injuries are frequent in adults and children. A sprain is an injury to the ligaments that hold your bones together. A strain is an injury to muscle or muscle cord-like structures (tendons) from stretching or pulling. Generally, when wrists are moderately tender to touch following a fall or injury, a break in the bone (fracture) may be present. Most wrist sprains or strains are better in 3 to 5 days, but complete healing may take several weeks.  HOME CARE INSTRUCTIONS    Put ice on the injured area.   Put ice in a plastic bag.   Place a towel between your skin and the bag.   Leave the ice on for 15 to 20 minutes, 3 to 4 times a day, for the first 2 days.   Keep your arm raised above the level of your heart whenever possible to reduce swelling and pain.   Rest the injured area for at least 48 hours or as directed by your caregiver.   If a splint or elastic bandage has been applied, use it for as long as directed by your caregiver or until seen by a caregiver for a follow-up exam.   Only take over-the-counter or prescription medicines for pain, discomfort, or fever as directed by your caregiver.   Keep all follow-up appointments. You may need to follow up with a specialist or have follow-up X-rays. Improvement in pain level is not a guarantee that you did not fracture a bone in your wrist. The only way to determine whether or not you have a broken bone is by X-ray.  SEEK IMMEDIATE MEDICAL CARE IF:    Your fingers are swollen, very red, white, or cold and blue.   Your fingers are numb or tingling.   You have increasing pain.   You have difficulty moving your fingers.  MAKE SURE YOU:    Understand these instructions.   Will watch your condition.   Will get help right away if you are not doing well or get worse.  Document Released: 08/02/2005 Document Revised: 01/15/2012 Document Reviewed: 12/14/2010  Exit[employer] Patient Information 2013 [employer].

## 2013-01-09 ENCOUNTER — Encounter: Payer: Self-pay | Admitting: Physician Assistant

## 2013-01-09 ENCOUNTER — Other Ambulatory Visit: Payer: Self-pay | Admitting: Physician Assistant

## 2013-01-09 DIAGNOSIS — M545 Low back pain, unspecified: Secondary | ICD-10-CM

## 2013-01-09 MED ORDER — OXYCODONE-ACETAMINOPHEN 10-325 MG PO TABS: 1.0000 | ORAL_TABLET | ORAL | Status: DC | PRN

## 2013-01-09 NOTE — Progress Notes (Signed)
Reviewed and agree.

## 2013-01-13 ENCOUNTER — Other Ambulatory Visit: Payer: Self-pay | Admitting: Physician Assistant

## 2013-01-15 ENCOUNTER — Ambulatory Visit (INDEPENDENT_AMBULATORY_CARE_PROVIDER_SITE_OTHER): Payer: 59 | Admitting: Physician Assistant

## 2013-01-15 ENCOUNTER — Encounter: Payer: Self-pay | Admitting: Physician Assistant

## 2013-01-15 VITALS — BP 120/74 | HR 100 | Temp 98.0°F | Resp 16 | Ht 66.0 in | Wt 188.4 lb

## 2013-01-15 DIAGNOSIS — F19939 Other psychoactive substance use, unspecified with withdrawal, unspecified: Secondary | ICD-10-CM

## 2013-01-15 DIAGNOSIS — M545 Low back pain, unspecified: Secondary | ICD-10-CM

## 2013-01-15 MED ORDER — CLONIDINE HCL 0.1 MG PO TABS: 0.1000 mg | ORAL_TABLET | Freq: Three times a day (TID) | ORAL | Status: DC

## 2013-01-15 MED ORDER — OXYCODONE HCL ER 40 MG PO T12A: 40.0000 mg | EXTENDED_RELEASE_TABLET | Freq: Two times a day (BID) | ORAL | Status: DC

## 2013-01-15 MED ORDER — OXYCODONE HCL ER 30 MG PO T12A: 30.0000 mg | EXTENDED_RELEASE_TABLET | Freq: Two times a day (BID) | ORAL | Status: DC

## 2013-01-15 MED ORDER — OXYCODONE-ACETAMINOPHEN 10-325 MG PO TABS: 1.0000 | ORAL_TABLET | ORAL | Status: DC | PRN

## 2013-01-15 NOTE — Patient Instructions (Addendum)
Take the clonidine three times daily as prescribed. Please take your Adderall as prescribed. WEAR YOUR CPAP!!!  [name] must keep your pain medications and will dispense them the way we've discussed.  This supply of medication will last 30 days. Use your support when you are feeling down, stressed, anxious, etc.

## 2013-01-15 NOTE — Progress Notes (Signed)
Subjective:    Patient ID: [name], female    DOB: 1970/12/24, 42 y.o.   MRN: [id number]  HPI This 42 y.o. female presents for evaluation of chronic pain and withdrawal symptoms.  Please see my previous notes. Her daughter, [name] is with her today, and they want to discuss a change in the design of her support structure at home.  [name]'s partner has not engaged in the support they expected, and [name] will be taking over the role.  It is not clear that she and her partner will stay together, but they remain in the same house for now. Since she saw me last, she took a major fall and saw Dr. Dareen Piano. She has been over-using the pain medications I prescribed 2/27 and she filled 3/03.  Reports she has taken extra doses "as needed" for breakthrough pain.  She asks if I can give more of the immediate release product for this use last Rx for this was written and filled on 3/06, for #40).  The patient was sent home to retrieve her pill bottles for my review. Oxycontin 15 mg - 7 tabs remaining Oxycontin 20 mg - 6 tabs remaining Oxycontin 30 mg - 2 tabs remaining Percocet 10 - 2 tabs remaining  Yesterday in the car had an episode "I thought I was having a heart attack." She describes nausea, chills, diaphoresis, LEFT arm tingling and little bit of chest pain.  "It was probably a panic attack." symptoms completely resolved.  She has developed diarrhea today.  Past Medical History  Diagnosis Date  . Anxiety   . Depression   . GERD (gastroesophageal reflux disease)   . Esophageal stricture     s/p dilatation x 2  . Hyperlipemia   . Hypertension   . History of alcohol abuse     no alcohol since 2004  . Migraine headache   . History of cocaine abuse     none since 2011, entered treatment  . Bipolar disorder   . Pancreatitis, acute 04/2011  . Obesity   . Nephrolithiasis     x 4    Past Surgical History  Procedure Laterality Date  . Appendectomy    . Cholecystectomy    . Abdominal  hysterectomy    . Cesarean section    . Knee surgery  2010    leftx3  . Nasal septum surgery    . Carpal tunnel release  07/29/2012    Procedure: CARPAL TUNNEL RELEASE;  Surgeon: Eldred Manges, MD;  Location: [facility];  Service: Orthopedics;  Laterality: Right;  Right carpal tunnel release    Prior to Admission medications   Medication Sig Start Date End Date Taking? Authorizing Provider  ALPRAZolam Prudy Feeler) 1 MG tablet TAKE 1 TABLET BY MOUTH THREE TIMES DAILY AS NEEDED 01/13/13  Yes [name] S Jeffery, PA-C  amLODipine (NORVASC) 10 MG tablet Take 1 tablet (10 mg total) by mouth daily. 08/02/12  Yes [name] S Jeffery, PA-C  amphetamine-dextroamphetamine (ADDERALL) 30 MG tablet Take 0.5 tablets (15 mg total) by mouth 3 (three) times daily. 12/30/12  Yes [name] S Jeffery, PA-C  cloNIDine (CATAPRES) 0.1 MG tablet TAKE 1 TABLET BY MOUTH THREE TIMES DAILY AS NEEDED FOR WITHDRAWL SYMPTOMS 12/11/12  Yes Geroge Baseman Marte, PA-C  cloNIDine (CATAPRES) 0.2 MG tablet Take 0.5 tablets (0.1 mg total) by mouth 3 (three) times daily as needed (withdrawl symptoms). 10/23/12  Yes Eleanore Delia Chimes, PA-C  EPINEPHrine (EPIPEN) 0.3 mg/0.3 mL DEVI Inject 0.3 mLs (  0.3 mg total) into the muscle as needed (inject at time of signs of anaphylaxis). 06/04/12  Yes Tatyana A Kirichenko, PA-C  lidocaine (LIDODERM) 5 % Place 1 patch onto the skin daily. Remove & Discard patch within 12 hours or as directed by MD 11/21/12  Yes [name] S Jeffery, PA-C  LORazepam (ATIVAN) 1 MG tablet Take 1-2 tablets (1-2 mg total) by mouth every 8 (eight) hours as needed for anxiety. 12/30/12  Yes [name] S Jeffery, PA-C  meloxicam (MOBIC) 15 MG tablet Take 1 tablet (15 mg total) by mouth daily. 11/21/12  Yes [name] S Jeffery, PA-C  metaxalone (SKELAXIN) 800 MG tablet Take 1 tablet (800 mg total) by mouth 3 (three) times daily. 11/21/12  Yes [name] S Jeffery, PA-C  OxyCODONE (OXYCONTIN) 15 mg T12A Take 1 tablet (15 mg total) by mouth every 12  (twelve) hours. Take with 20 mg AND 30 mg to achieve 65 mg total dose. 01/02/13  Yes [name] S Jeffery, PA-C  OxyCODONE (OXYCONTIN) 20 mg T12A Take 1 tablet (20 mg total) by mouth every 12 (twelve) hours. Take with 30 mg AND 15 mg to achieve 65 mg dose 01/02/13  Yes [name] S Jeffery, PA-C  OxyCODONE HCl ER 30 MG T12A Take 30 mg by mouth every 12 (twelve) hours. Take with 20 mg AND 15 mg to achieve 65 mg dose 01/02/13  Yes [name] S Jeffery, PA-C  oxyCODONE-acetaminophen (PERCOCET) 10-325 MG per tablet Take 1 tablet by mouth every 4 (four) hours as needed for pain (breakthrough). 01/09/13  Yes [name] S Jeffery, PA-C  pantoprazole (PROTONIX) 40 MG tablet TAKE 1 TABLET BY MOUTH ONCE DAILY 12/25/12  Yes Heather M Marte, PA-C  sertraline (ZOLOFT) 100 MG tablet TAKE 1 TABLET BY MOUTH ONCE DAILY 09/26/12  Yes Heather M Marte, PA-C  naproxen sodium (ANAPROX DS) 550 MG tablet Take 1 tablet (550 mg total) by mouth 2 (two) times daily with a meal. 01/07/13 01/07/14  Phillips Odor, MD    Allergies  Allergen Reactions  . Imitrex (Sumatriptan Base) Shortness Of Breath  . Metoclopramide Hcl Itching  . Morphine And Related Itching  . Tegretol (Carbamazepine) Itching  . Topamax Itching  . Trazodone And Nefazodone Other (See Comments)    Nightmares    History   Social History  . Marital Status: Significant Other    Spouse Name: [name]    Number of Children: 2  . Years of Education: N/A   Occupational History  . CNA    Social History Main Topics  . Smoking status: Current Every Day Smoker -- 1.00 packs/day for 20 years    Types: Cigarettes  . Smokeless tobacco: Never Used  . Alcohol Use: No     Comment: none since 2004  . Drug Use: No     Comment: none since 2011  . Sexually Active: Yes -- Female partner(s)   Other Topics Concern  . Not on file   Social History Narrative   3 caffeine drinks daily       Was recruited to [employer], with a full scholarship, for undergraduate studies,  but had to decline due to family problems.  She then planned to attend [employer] ([employer]) but wasn't able to work and attend courses, so went to [employer] and studied Production designer, theatre/television/film.      Has dropped out of the substance abuse counseling program she was enrolled in, having figured out that it's not a career path she wants to continue.  She has applied for  a position with [employer] and ultimately hopes to work for [employer].  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                Family History  Problem Relation Age of Onset  . Colon cancer Neg Hx   . Heart disease Maternal Grandmother   . Kidney cancer Mother   . Hypertension Mother   . Cancer Mother     kidney  . Lung cancer Maternal Aunt   . Stroke Maternal Uncle 55  . Alcohol abuse Maternal Uncle     recovered  . Heart disease Maternal Uncle     Review of Systems As above.    Objective:   Physical Exam  Blood pressure 120/74, pulse 100, temperature 98 F (36.7 C), temperature source Oral, resp. rate 16, height 5\' 6"  (1.676 m), weight 188 lb 6.4 oz (85.458 kg), SpO2 100.00%. Body mass index is 30.42 kg/(m^2). Well-developed, well nourished WF who is awake, alert and oriented, in NAD, but obviously upset. HEENT: [place]/AT, sclera and conjunctiva are clear.   Neck: supple, non-tender, no lymphadenopathy, thyromegaly. Heart: RRR, no murmur Lungs: normal effort, CTA Extremities: no cyanosis, clubbing or edema. Back: Very tender over the lumbar spine and paraspinous muscles. Good strength and sensation in the lower extremities. Skin: warm and dry without rash. Psychologic: good mood and appropriate affect, normal speech and behavior.     Assessment & Plan:  Lumbar back pain - Plan: OxyCODONE HCl ER 30 MG T12A, OxyCODONE (OXYCONTIN) 40 mg T12A, oxyCODONE-acetaminophen (PERCOCET) 10-325 MG per tablet  Withdrawal syndrome - Plan: cloNIDine (CATAPRES) 0.1 MG tablet   Addendum 01/16/2013: I spoke with the  pharmacist at [facility], both 3/12 at 7:25 pm and 3/13 at 9:55 am.  After taking last night's and this morning's doses of 65 mg, she should have #4 20 mg tabs and #5 15 mg tabs remaining.  She can take #20 20 mg and #2 15 mg tabs to achieve 70 mg total for two additional doses (this evening and tomorrow morning).  Tomorrow, the Rx's I wrote last night may be filled, but the extended release products only #15 tabs each (for a 2 week supply). As planned last night, [name] will keep the pills in a lock box and dispense no more than 12 hours of medication every 12 hours.    If this plan is not effective, or if her pain or withdrawal symptoms worsen, I will no longer be able to continue treating her pain.  We are waiting to hear back about the referral made to neurosurgery, and hope that there is an intervention to address her chronic back pain.  If not, I will increase my efforts to get her into Pain Management (she was unhappy at the treatment offered by [facility] and we haven't heard back from the [facility] clinic as yet).

## 2013-01-15 NOTE — Telephone Encounter (Signed)
Pt seen in office

## 2013-01-16 ENCOUNTER — Telehealth: Payer: Self-pay | Admitting: Radiology

## 2013-01-16 NOTE — Telephone Encounter (Signed)
Please call this patient and her daughter, [name].  I spoke with the pharmacist at [facility], both 3/12 at 7:25 pm and 3/13 at 9:55 am.  After taking last night's and this morning's doses of 65 mg, she should have #4 20 mg tabs and #5 15 mg tabs remaining.  She can take #20 20 mg and #2 15 mg tabs to achieve 70 mg total for two additional doses (this evening and tomorrow morning).  Tomorrow, 3/14, the Rx's I wrote last night may be filled, but the extended release products only #15 tabs each (for a 2 week supply). As planned last night, [name] will keep the pills in a lock box and dispense no more than 12 hours of medication every 12 hours.    If this plan is not effective, or if her pain or withdrawal symptoms worsen, I will no longer be able to continue treating her pain.  We are waiting to hear back about the referral made to neurosurgery, and hope that there is an intervention to address her chronic back pain.  If not, I will increase my efforts to get her into Pain Management (she was unhappy at the treatment offered by [facility] and we haven't heard back from the [facility] Pain Management clinic as yet).

## 2013-01-16 NOTE — Telephone Encounter (Signed)
Patient needs to be seen in regards to a med check, have you seen her. Pharmacy has expressed concern

## 2013-01-16 NOTE — Telephone Encounter (Signed)
Insurance will not cover her meds at this point she can get from insurance company 7 days early insurance is rejecting the 40 mg dose. Advised pharmacy patient will need to pay out of pocket for her medications until her insurance will cover. To you FYI

## 2013-01-20 ENCOUNTER — Other Ambulatory Visit: Payer: Self-pay | Admitting: Physician Assistant

## 2013-01-20 ENCOUNTER — Other Ambulatory Visit: Payer: Self-pay | Admitting: Radiology

## 2013-01-20 ENCOUNTER — Telehealth: Payer: Self-pay

## 2013-01-20 ENCOUNTER — Encounter: Payer: Self-pay | Admitting: Physician Assistant

## 2013-01-20 DIAGNOSIS — F19939 Other psychoactive substance use, unspecified with withdrawal, unspecified: Secondary | ICD-10-CM

## 2013-01-20 MED ORDER — LORAZEPAM 1 MG PO TABS: 1.0000 mg | ORAL_TABLET | Freq: Three times a day (TID) | ORAL | Status: DC | PRN

## 2013-01-20 MED ORDER — DIAZEPAM 5 MG PO TABS: 5.0000 mg | ORAL_TABLET | Freq: Once | ORAL | Status: DC

## 2013-01-20 NOTE — Telephone Encounter (Signed)
Chart reviewed. Patient reported to me that she uses the alprazolam generally for her anxiety symptoms, but switches to lorazepam when her nausea worsens. The valium is to replace either of these in preparation for pre-procedure panic, for which she states that the others are ineffective. After discussing with Arlys [name], we agree to dispense only #2 valium for the procedure.

## 2013-01-20 NOTE — Telephone Encounter (Signed)
Pharmacist, Arlys [name], would like [name] to call him about pt's Rxs for Valium, along w/her Ativan and Xanax. He needs to speak with her directly and document before he can fill for pt.

## 2013-01-21 ENCOUNTER — Ambulatory Visit (HOSPITAL_COMMUNITY)
Admission: RE | Admit: 2013-01-21 | Discharge: 2013-01-21 | Disposition: A | Discharge status: Home / Self Care | Payer: 59 | Source: Ambulatory Visit | Attending: Emergency Medicine | Admitting: Emergency Medicine

## 2013-01-21 DIAGNOSIS — Q619 Cystic kidney disease, unspecified: Secondary | ICD-10-CM | POA: Insufficient documentation

## 2013-01-21 DIAGNOSIS — M5126 Other intervertebral disc displacement, lumbar region: Secondary | ICD-10-CM | POA: Insufficient documentation

## 2013-01-21 DIAGNOSIS — M519 Unspecified thoracic, thoracolumbar and lumbosacral intervertebral disc disorder: Secondary | ICD-10-CM

## 2013-01-21 DIAGNOSIS — M545 Low back pain, unspecified: Secondary | ICD-10-CM | POA: Insufficient documentation

## 2013-01-21 NOTE — Progress Notes (Signed)
Reviewed and agree.

## 2013-01-22 ENCOUNTER — Encounter: Payer: Self-pay | Admitting: Physician Assistant

## 2013-01-23 ENCOUNTER — Encounter: Payer: Self-pay | Admitting: Physician Assistant

## 2013-01-24 ENCOUNTER — Other Ambulatory Visit: Payer: Self-pay | Admitting: Physician Assistant

## 2013-01-24 ENCOUNTER — Telehealth: Payer: Self-pay

## 2013-01-24 DIAGNOSIS — M545 Low back pain, unspecified: Secondary | ICD-10-CM

## 2013-01-24 MED ORDER — OXYCODONE HCL ER 30 MG PO T12A: 30.0000 mg | EXTENDED_RELEASE_TABLET | Freq: Two times a day (BID) | ORAL | Status: DC

## 2013-01-24 MED ORDER — OXYCODONE HCL ER 40 MG PO T12A: 40.0000 mg | EXTENDED_RELEASE_TABLET | Freq: Two times a day (BID) | ORAL | Status: DC

## 2013-01-24 NOTE — Telephone Encounter (Signed)
Pts daughter Lynnann Knudsen is calling regarding a refill on pt's oxycodone. Best# [phone]

## 2013-01-29 ENCOUNTER — Encounter: Payer: Self-pay | Admitting: Physician Assistant

## 2013-01-30 ENCOUNTER — Other Ambulatory Visit: Payer: Self-pay | Admitting: Physician Assistant

## 2013-01-30 ENCOUNTER — Telehealth: Payer: Self-pay | Admitting: Radiology

## 2013-01-30 DIAGNOSIS — F19939 Other psychoactive substance use, unspecified with withdrawal, unspecified: Secondary | ICD-10-CM

## 2013-01-30 MED ORDER — LORAZEPAM 1 MG PO TABS: 1.0000 mg | ORAL_TABLET | Freq: Three times a day (TID) | ORAL | Status: DC | PRN

## 2013-01-30 NOTE — Telephone Encounter (Signed)
CALLED into [facility] pharmacy per [name].

## 2013-02-07 ENCOUNTER — Ambulatory Visit (INDEPENDENT_AMBULATORY_CARE_PROVIDER_SITE_OTHER): Payer: 59 | Admitting: Physician Assistant

## 2013-02-07 VITALS — BP 120/84 | HR 106 | Temp 97.3°F | Resp 16 | Ht 65.5 in | Wt 182.8 lb

## 2013-02-07 DIAGNOSIS — F988 Other specified behavioral and emotional disorders with onset usually occurring in childhood and adolescence: Secondary | ICD-10-CM

## 2013-02-07 DIAGNOSIS — R110 Nausea: Secondary | ICD-10-CM

## 2013-02-07 DIAGNOSIS — M545 Low back pain, unspecified: Secondary | ICD-10-CM

## 2013-02-07 DIAGNOSIS — R52 Pain, unspecified: Secondary | ICD-10-CM

## 2013-02-07 MED ORDER — OXYCODONE-ACETAMINOPHEN 10-325 MG PO TABS: 1.0000 | ORAL_TABLET | ORAL | Status: DC | PRN

## 2013-02-07 MED ORDER — OXYCODONE HCL ER 15 MG PO T12A: 15.0000 mg | EXTENDED_RELEASE_TABLET | Freq: Two times a day (BID) | ORAL | Status: DC

## 2013-02-07 MED ORDER — ONDANSETRON 4 MG PO TBDP
8.0000 mg | ORAL_TABLET | Freq: Once | ORAL | Status: AC
  Administered 2013-02-07: 8 mg via ORAL

## 2013-02-07 MED ORDER — OXYCODONE HCL ER 10 MG PO T12A: 10.0000 mg | EXTENDED_RELEASE_TABLET | Freq: Two times a day (BID) | ORAL | Status: DC

## 2013-02-07 MED ORDER — AMPHETAMINE-DEXTROAMPHETAMINE 30 MG PO TABS: 15.0000 mg | ORAL_TABLET | Freq: Three times a day (TID) | ORAL | Status: DC

## 2013-02-07 NOTE — Progress Notes (Signed)
Subjective:    Patient ID: [name], female    DOB: [birth year], 42 y.o.   MRN: [id number]  HPI  This 42 y.o. female presents for evaluation of chronic pain.  They've been working well together to take the medications as prescribed, with her daughter [name] in charge of the medications and dispensing them as scheduled.  She's ready to reduce the dose of OxyContin from 70 mg to 65 mg, but finds that there is a considerable amount of anxiety around not having rescue meds available when she needs it.  She has been working long shifts (12 hours) doing more physical work over the last week.  Some days she doesn't need rescue meds at all.  Most days she needs one dose, some days as many as 3 doses.  Current pill counts:   Oxycontin 40 mg #15 Oxycontin 30 mg #13 ( daughter reports that she believes that there are 2 of these in another container at home) Percocet rescue: not brought in today, estimate 4-5 remaining  Additionally, has had nausea, fatigue, HA and subjective fever x 2 days.  Describes a burning sensation in her upper abdomen and chest. No coughing, ST, ear pain, congestion, diarrhea, new/different myalgias. Feels different from her regular migraine.  No urinary symptoms.   Past Medical History  Diagnosis Date  . Anxiety   . Depression   . GERD (gastroesophageal reflux disease)   . Esophageal stricture     s/p dilatation x 2  . Hyperlipemia   . Hypertension   . History of alcohol abuse     no alcohol since 2004  . Migraine headache   . History of cocaine abuse     none since 2011, entered treatment  . Bipolar disorder   . Pancreatitis, acute 04/2011  . Obesity   . Nephrolithiasis     x 4    Past Surgical History  Procedure Laterality Date  . Appendectomy    . Cholecystectomy    . Abdominal hysterectomy    . Cesarean section    . Knee surgery  2010    leftx3  . Nasal septum surgery    . Carpal tunnel release  07/29/2012    Procedure: CARPAL TUNNEL RELEASE;  Surgeon:  Eldred Manges, MD;  Location: [facility];  Service: Orthopedics;  Laterality: Right;  Right carpal tunnel release    Prior to Admission medications   Medication Sig Start Date End Date Taking? Authorizing Provider  ALPRAZolam Prudy Feeler) 1 MG tablet TAKE 1 TABLET BY MOUTH THREE TIMES DAILY AS NEEDED 01/13/13  Yes Matalie Romberger S Blade Scheff, PA-C  amLODipine (NORVASC) 10 MG tablet Take 1 tablet (10 mg total) by mouth daily. 08/02/12  Yes Courtney Bellizzi S Kayleana Waites, PA-C  amphetamine-dextroamphetamine (ADDERALL) 30 MG tablet Take 0.5 tablets (15 mg total) by mouth 3 (three) times daily. 12/30/12  Yes Kirby Argueta S Teagen Bucio, PA-C  cloNIDine (CATAPRES) 0.1 MG tablet Take 1 tablet (0.1 mg total) by mouth 3 (three) times daily. 01/15/13  Yes Jadamarie Butson S Violia Knopf, PA-C  EPINEPHrine (EPIPEN) 0.3 mg/0.3 mL DEVI Inject 0.3 mLs (0.3 mg total) into the muscle as needed (inject at time of signs of anaphylaxis). 06/04/12  Yes Tatyana A Kirichenko, PA-C  lidocaine (LIDODERM) 5 % Place 1 patch onto the skin daily. Remove & Discard patch within 12 hours or as directed by MD 11/21/12  Yes Cayley Pester S Rakesh Dutko, PA-C  LORazepam (ATIVAN) 1 MG tablet Take 1-2 tablets (1-2 mg total) by mouth every 8 (eight) hours  as needed for anxiety. Use instead of, not in addition to, alprazolam. 01/30/13  Yes Jamare Vanatta S Eryk Beavers, PA-C  metaxalone (SKELAXIN) 800 MG tablet Take 1 tablet (800 mg total) by mouth 3 (three) times daily. 11/21/12  Yes Sharee Sturdy S Euline Kimbler, PA-C  OxyCODONE (OXYCONTIN) 40 mg T12A Take 1 tablet (40 mg total) by mouth every 12 (twelve) hours. Take with 30 mg tab to achieve 70 mg total dose. May fill 01/30/2013. 01/24/13  Yes Zebadiah Willert S Kenya Shiraishi, PA-C  OxyCODONE HCl ER 30 MG T12A Take 30 mg by mouth every 12 (twelve) hours. Take with 40 mg tab to achieve 70 mg total dose. May fill 01/30/2013. 01/24/13  Yes Chidiebere Wynn Tessa Lerner, PA-C  oxyCODONE-acetaminophen (PERCOCET) 10-325 MG per tablet Take 1 tablet by mouth every 4 (four) hours as needed for pain  (breakthrough). 01/15/13  Yes Jaben Benegas S Cutler Sunday, PA-C  pantoprazole (PROTONIX) 40 MG tablet TAKE 1 TABLET BY MOUTH ONCE DAILY 12/25/12  Yes Heather M Marte, PA-C  sertraline (ZOLOFT) 100 MG tablet TAKE 1 TABLET BY MOUTH ONCE DAILY 09/26/12  Yes Nelva Nay, PA-C    Allergies  Allergen Reactions  . Imitrex (Sumatriptan Base) Shortness Of Breath  . Metoclopramide Hcl Itching  . Morphine And Related Itching  . Tegretol (Carbamazepine) Itching  . Topamax Itching  . Trazodone And Nefazodone Other (See Comments)    Nightmares    History   Social History  . Marital Status: Significant Other    Spouse Name: Dustin Flock    Number of Children: 2  . Years of Education: N/A   Occupational History  . CNA    Social History Main Topics  . Smoking status: Current Every Day Smoker -- 1.00 packs/day for 20 years    Types: Cigarettes  . Smokeless tobacco: Never Used  . Alcohol Use: No     Comment: none since 2004  . Drug Use: No     Comment: none since 2011  . Sexually Active: Yes -- Female partner(s)   Family History  Problem Relation Age of Onset  . Colon cancer Neg Hx   . Heart disease Maternal Grandmother   . Kidney cancer Mother   . Hypertension Mother   . Cancer Mother     kidney  . Lung cancer Maternal Aunt   . Stroke Maternal Uncle 55  . Alcohol abuse Maternal Uncle     recovered  . Heart disease Maternal Uncle     Review of Systems As above.    Objective:   Physical Exam Blood pressure 120/84, pulse 106, temperature 97.3 F (36.3 C), temperature source Oral, resp. rate 16, height 5' 5.5" (1.664 m), weight 182 lb 12.8 oz (82.918 kg), SpO2 100.00%. Body mass index is 29.95 kg/(m^2). Well-developed, well nourished WF who is awake, alert and oriented, in NAD. HEENT: [place]/AT, PERRL, EOMI.  Sclera and conjunctiva are clear.  EAC are patent, TMs are normal in appearance. Nasal mucosa is pink and moist. OP is clear. Neck: supple, non-tender, no lymphadenopathy,  thyromegaly. Heart: RRR, no murmur Lungs: normal effort, CTA Abdomen: normo-active bowel sounds, supple, no mass or organomegaly. She has exquisite epigastric tenderness. Extremities: no cyanosis, clubbing or edema. Skin: warm and dry without rash. Psychologic: good mood and appropriate affect, normal speech and behavior.  I spoke with Arlys [name] at the [facility] regarding the dose reduction and method to achieve it utilizing the remaining supply she has.     Assessment & Plan:  Lumbar back pain - Plan: oxyCODONE-acetaminophen (PERCOCET) 10-325  MG per tablet (increased monthly supply from 60 tabs to 90 tabs to reduce anxiety of not having an adequate supply), OxyCODONE (OXYCONTIN) 10 mg T12A, OxyCODONE (OXYCONTIN) 15 mg T12A  Nausea alone - Plan: ondansetron (ZOFRAN-ODT) disintegrating tablet 8 mg given here in the office.  Suspect this is GERD.  Anticipatory guidance.  ADD (attention deficit disorder) - Plan: amphetamine-dextroamphetamine (ADDERALL) 30 MG tablet  Patient Instructions  Reduce the dose of OxyContin to 65 mg every 12 hours. You can achieve this dose with: A) 40 + 15 +10 OR B) 30 + 15 + 10 +10  With what you have left of the 30 mg and 40 mg tabs, plus the new prescriptions today, you have 15 doses of medication.  If you are stable on this dose in 2 weeks, I will prescribe more to keep you at this dose.  If you are ready to reduce the dose again, please come in.

## 2013-02-07 NOTE — Patient Instructions (Signed)
Reduce the dose of OxyContin to 65 mg every 12 hours. You can achieve this dose with: A) 40 + 15 +10 OR B) 30 + 15 + 10 +10  With what you have left of the 30 mg and 40 mg tabs, plus the new prescriptions today, you have 15 doses of medication.  If you are stable on this dose in 2 weeks, I will prescribe more to keep you at this dose.  If you are ready to reduce the dose again, please come in.

## 2013-02-12 ENCOUNTER — Encounter: Payer: Self-pay | Admitting: Physician Assistant

## 2013-02-12 ENCOUNTER — Other Ambulatory Visit: Payer: Self-pay | Admitting: Physician Assistant

## 2013-02-12 DIAGNOSIS — F19939 Other psychoactive substance use, unspecified with withdrawal, unspecified: Secondary | ICD-10-CM

## 2013-02-12 MED ORDER — LORAZEPAM 1 MG PO TABS: 1.0000 mg | ORAL_TABLET | Freq: Three times a day (TID) | ORAL | Status: DC | PRN

## 2013-02-14 ENCOUNTER — Encounter: Payer: Self-pay | Admitting: Physician Assistant

## 2013-02-14 ENCOUNTER — Other Ambulatory Visit: Payer: Self-pay | Admitting: Physician Assistant

## 2013-02-14 NOTE — Telephone Encounter (Signed)
Grettel,  In general, yes, but since I just wrote you for #30 Ativan on 02/12/2013, we'll need to wait until you've used that to write for more.  However, if you haven't filled it yet and can bring the prescription back, I'm happy to re-write it for you for a month's supply.  Warmly, Briley Bumgarner

## 2013-02-14 NOTE — Telephone Encounter (Signed)
I think this is a duplicate message. I apologize if my reply is also a duplicate.  Generally, yes, I can write for you to have Ativan in a larger supply and stop the Xanax.  Since I just wrote Ativan #30 on 4/09, we'll need to wait until that is gone before I write more.  However, if by chance, you haven't filled that prescription yet, you can bring it back, and I'll write a new one for a larger supply.  The current instructions are 1-2 every 8 hours as needed.  How many are you taking each day?

## 2013-02-20 ENCOUNTER — Encounter: Payer: Self-pay | Admitting: Physician Assistant

## 2013-02-20 ENCOUNTER — Other Ambulatory Visit: Payer: Self-pay | Admitting: Physician Assistant

## 2013-02-20 NOTE — Telephone Encounter (Signed)
PT WOULD LIKE TO KNOW IF [name] WOULD GIVE HER ATIVAN FOR 90 DAYS INSTEAD OF THE 30 DAY SUPPLY ALSO PLEASE CALL [phone]    [place]

## 2013-02-21 ENCOUNTER — Ambulatory Visit (INDEPENDENT_AMBULATORY_CARE_PROVIDER_SITE_OTHER): Payer: 59 | Admitting: Physician Assistant

## 2013-02-21 ENCOUNTER — Encounter: Payer: Self-pay | Admitting: Physician Assistant

## 2013-02-21 ENCOUNTER — Telehealth: Payer: Self-pay

## 2013-02-21 VITALS — BP 127/87 | HR 92 | Temp 98.1°F | Resp 16 | Ht 67.0 in | Wt 183.0 lb

## 2013-02-21 DIAGNOSIS — F19939 Other psychoactive substance use, unspecified with withdrawal, unspecified: Secondary | ICD-10-CM

## 2013-02-21 DIAGNOSIS — K5900 Constipation, unspecified: Secondary | ICD-10-CM

## 2013-02-21 DIAGNOSIS — M545 Low back pain, unspecified: Secondary | ICD-10-CM

## 2013-02-21 MED ORDER — LORAZEPAM 1 MG PO TABS: 1.0000 mg | ORAL_TABLET | Freq: Three times a day (TID) | ORAL | Status: DC | PRN

## 2013-02-21 MED ORDER — OXYCODONE HCL ER 20 MG PO T12A: 20.0000 mg | EXTENDED_RELEASE_TABLET | Freq: Two times a day (BID) | ORAL | Status: DC

## 2013-02-21 MED ORDER — OXYCODONE HCL ER 15 MG PO T12A: 45.0000 mg | EXTENDED_RELEASE_TABLET | Freq: Two times a day (BID) | ORAL | Status: DC

## 2013-02-21 MED ORDER — LINACLOTIDE 290 MCG PO CAPS: 290.0000 | ORAL_CAPSULE | Freq: Every day | ORAL | Status: DC

## 2013-02-21 NOTE — Progress Notes (Signed)
   [address], [place] [address]   Phone [phone]  Subjective:    Patient ID: [name], female    DOB: August 23, 1971, 42 y.o.   MRN: [id number]  HPI Pt presents to clinic for med refill.  She is in a planned wean from her oxycontin and is working closely with [employer] PA-C.  She sees her once a month and the Rx are written for 2 weeks at a time.  The patient's daughter is distributing her medications to her so overuse is less of an issue.  The patient was told by her daughter today that she only had enough medications for tonight's dose.  She did not want to wait until tomorrow to see [name] because her pharmacy is only open M-F.  She is on Oxycontin 65mg  bid and takes Ativan for her anxiety- it seems to help more with the nausea she is getting from withdraw as her dose is lowered.  She is using 1 mg of the Ativan tid most times.  She also needs refills of her constipation meds that Dr Arlyce Dice has been writing for her.   Review of Systems     Objective:   Physical Exam  Vitals reviewed. Constitutional: She is oriented to person, place, and time. She appears well-developed and well-nourished.  Pulmonary/Chest: Effort normal.  Neurological: She is alert and oriented to person, place, and time.  Skin: Skin is warm and dry.  Psychiatric: She has a normal mood and affect. Her behavior is normal. Judgment and thought content normal.       Assessment & Plan:  I reviewed her past notes and called Porfirio Oar to have her approval to write this medication because of our controlled medication policy I d/w pt that earlier notification would be better in the future so this does not happen.  I will write these medications for her this time because I do not want patient to withdraw.  She will continue with her plan for an appt in 2 wks.  I called the pharmacist at [facility] and together we determined her dosing amount for the next 2 weeks.     I spent gathering  information and talking with other providers and pharmacist about this patient.  Lumbar back pain -  Plan: Continue at Oxycontin 65mg  bid dosing with the following combination OXYCONTIN 15 mg - 3 bid OXYCONTIN 20 mg - 1 bid  Withdrawal syndrome - Plan: LORazepam (ATIVAN) 1 MG tablet - it seems that patient mainly takes Ativan 1mg  tid but I have given her a 2 wk supply where she can take 2 pills with about half of her doses - she agreed that the amount should be enough for 2 wks.  Unspecified constipation - Plan: Linaclotide [facility]) 290 MCG CAPS  Benny Lennert [facility] 02/21/2013 4:41 PM

## 2013-02-21 NOTE — Telephone Encounter (Signed)
Pharmacy requests (per pt request) that [name] prescribe her Linzess 290 mcg, 1 QD that has previously been Rxd for Dr Arlyce Dice.

## 2013-02-23 MED ORDER — LINACLOTIDE 290 MCG PO CAPS: 290.0000 | ORAL_CAPSULE | Freq: Every day | ORAL | Status: DC

## 2013-02-23 NOTE — Telephone Encounter (Signed)
Meds ordered this encounter  Medications  . Linaclotide (LINZESS) 290 MCG CAPS    Sig: Take 290 tablets by mouth daily.    Dispense:  90 capsule    Refill:  0    Order Specific Question:  Supervising Provider    Answer:  DOOLITTLE, ROBERT P [[id number]]

## 2013-03-05 ENCOUNTER — Ambulatory Visit (INDEPENDENT_AMBULATORY_CARE_PROVIDER_SITE_OTHER): Payer: 59 | Admitting: Physician Assistant

## 2013-03-05 VITALS — BP 129/85 | HR 98 | Temp 98.0°F | Resp 16 | Ht 66.0 in | Wt 186.0 lb

## 2013-03-05 DIAGNOSIS — M545 Low back pain, unspecified: Secondary | ICD-10-CM

## 2013-03-05 DIAGNOSIS — Z111 Encounter for screening for respiratory tuberculosis: Secondary | ICD-10-CM

## 2013-03-05 DIAGNOSIS — F19939 Other psychoactive substance use, unspecified with withdrawal, unspecified: Secondary | ICD-10-CM

## 2013-03-05 DIAGNOSIS — F411 Generalized anxiety disorder: Secondary | ICD-10-CM

## 2013-03-05 DIAGNOSIS — F419 Anxiety disorder, unspecified: Secondary | ICD-10-CM

## 2013-03-05 MED ORDER — OXYCODONE HCL ER 60 MG PO T12A: 60.0000 mg | EXTENDED_RELEASE_TABLET | Freq: Two times a day (BID) | ORAL | Status: DC

## 2013-03-05 MED ORDER — ALPRAZOLAM 1 MG PO TABS: 1.0000 mg | ORAL_TABLET | Freq: Every evening | ORAL | Status: DC | PRN

## 2013-03-05 MED ORDER — OXYCODONE-ACETAMINOPHEN 10-325 MG PO TABS: 1.0000 | ORAL_TABLET | ORAL | Status: DC | PRN

## 2013-03-05 MED ORDER — LORAZEPAM 1 MG PO TABS: ORAL_TABLET | ORAL | Status: DC

## 2013-03-05 NOTE — Progress Notes (Signed)
  Subjective:    Patient ID: [name], female    DOB: [birth year], 42 y.o.   MRN: [id number]  HPI This 42 y.o. female presents for evaluation of Chronic low back pain, withdrawal symptoms, anxiety. Currently taking 65 mg.Ready to reduce her dose to 60 mg.  Of the 2-week supply provided: OxyIR 10/325 filled 02/07/2013 12/90 remaining OxyContin 15 filled 4/18 13/84 remaining  Oxycontin 20 filled 4/18 4/28 remaining  She notes that the lorazepam, which helps more with nausea (which she has with reducing the pain medication), doesn't help her sleep at bedtime.  We switched her last month from alprazolam to lorazepam for this reason. She notes that the alprazolam does help with sleep.  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    Review of Systems Has lost weight by cutting back on sodas and increasing her water, and green tea.  She finds that her back pain is less when she weighs less. No chest pain, SOB, HA, dizziness, vision change,  constipation, dysuria, urinary urgency or frequency or rash.     Objective:   Physical Exam Blood pressure 129/85, pulse 98, temperature 98 F (36.7 C), temperature source Oral, resp. rate 16, height 5\' 6"  (1.676 m), weight 186 lb (84.369 kg), SpO2 100.00%. Body mass index is 30.04 kg/(m^2). Well-developed, well nourished WF who is awake, alert and oriented, in NAD. HEENT: [place]/AT, PERRL, EOMI.  Sclera and conjunctiva are clear.  EAC are patent, TMs are normal in appearance. Nasal mucosa is pink and moist. OP is clear. Neck: supple, non-tender, no lymphadenopathy, thyromegaly. Heart: RRR, no murmur Lungs: normal effort, CTA Back: tenderness of the LUMBAR spine.  Paraspinous muscles are minimally tender. Extremities: no cyanosis, clubbing or edema. Skin: warm and dry without rash. Psychologic: good mood and appropriate affect, normal speech and behavior.     Assessment & Plan:  Lumbar back pain - REDUCE OXY ER  to 60 mg Plan: oxyCODONE-acetaminophen (PERCOCET) 10-325 MG per tablet, OxyCODONE HCl ER (OXYCONTIN) 60 MG T12A  Anxiety - Plan: ALPRAZolam (XANAX) 1 MG tablet (at HS), LORazepam (ATIVAN) 1 MG tablet (QAM and Q mid-day)  Withdrawal syndrome - Plan: LORazepam (ATIVAN) 1 MG tablet  Screening for tuberculosis - Plan: TB Skin Test  May call for Rx in 2 weeks, RTC in 4 weeks.  [name], PA-C Physician Assistant-Certified [facility]

## 2013-03-05 NOTE — Progress Notes (Signed)
  Tuberculosis Risk Questionnaire  1. Were you born outside the [place] in one of the following parts of the world:    [place], [place], [place], [place] or [place]?  No  2. Have you traveled outside the [place] and lived for more than one month in one of the following parts of the world:  [place], [place], [place], [place] or [place]?  No  3. Do you have a compromised immune system such as from any of the following conditions:  HIV/AIDS, organ or bone marrow transplantation, diabetes, immunosuppressive medicines (e.g. Prednisone, Remicaide), leukemia, lymphoma, cancer of the head or neck, gastrectomy or jejunal bypass, end-stage renal disease (on  dialysis), or silicosis?  No    4. Have you ever done one of the following:    Used crack cocaine, injected illegal drugs, worked or resided in jail or prison, worked or resided at a homeless shelter, or worked as a Research scientist (physical sciences) in direct contact with patients?  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 Have you ever been exposed to anyone with infectious tuberculosis?  Yes (uncertain, but yes, 03/03/2013)   Tuberculosis Symptom Questionnaire  Do you currently have any of the following symptoms?  1. Unexplained cough lasting more than 3 weeks? No  Unexplained fever lasting more than 3 weeks. No   3. Night Sweats (sweating that leaves the bedclothes and sheets wet)   No  4. Shortness of Breath No  5. Chest Pain No  6. Unintentional weight loss  No  7. Unexplained fatigue (very tired for no reason) No

## 2013-03-05 NOTE — Patient Instructions (Addendum)
Reduce the dose of OxyContin to 60 mg. Use up the supply that you have by taking 20 + 20 + 20 OR 15 + 15 + 15 + 15 You will have #1 20 mg tab and #1 15 mg tab remaining. [name] can keep these in the locked box for later, as we continue to reduce the dose. The NEW prescription for 60 mg may be filled Friday 03/07/2013.  Return in 48-72 hours to have the TB skin test read.

## 2013-03-08 ENCOUNTER — Encounter (INDEPENDENT_AMBULATORY_CARE_PROVIDER_SITE_OTHER): Payer: 59

## 2013-03-08 DIAGNOSIS — Z111 Encounter for screening for respiratory tuberculosis: Secondary | ICD-10-CM

## 2013-03-08 LAB — TB SKIN TEST: TB Skin Test: NEGATIVE

## 2013-03-13 ENCOUNTER — Encounter: Payer: Self-pay | Admitting: Physician Assistant

## 2013-03-14 ENCOUNTER — Other Ambulatory Visit: Payer: Self-pay | Admitting: Physician Assistant

## 2013-03-14 DIAGNOSIS — F988 Other specified behavioral and emotional disorders with onset usually occurring in childhood and adolescence: Secondary | ICD-10-CM

## 2013-03-14 MED ORDER — AMPHETAMINE-DEXTROAMPHETAMINE 30 MG PO TABS: 15.0000 mg | ORAL_TABLET | Freq: Three times a day (TID) | ORAL | Status: DC

## 2013-03-20 ENCOUNTER — Encounter: Payer: Self-pay | Admitting: Physician Assistant

## 2013-03-21 ENCOUNTER — Ambulatory Visit (INDEPENDENT_AMBULATORY_CARE_PROVIDER_SITE_OTHER): Payer: 59 | Admitting: Physician Assistant

## 2013-03-21 ENCOUNTER — Encounter: Payer: Self-pay | Admitting: Physician Assistant

## 2013-03-21 VITALS — BP 118/78 | HR 104 | Temp 97.5°F | Resp 16 | Ht 66.0 in | Wt 184.0 lb

## 2013-03-21 DIAGNOSIS — M545 Low back pain, unspecified: Secondary | ICD-10-CM

## 2013-03-21 DIAGNOSIS — R5381 Other malaise: Secondary | ICD-10-CM

## 2013-03-21 DIAGNOSIS — R5383 Other fatigue: Secondary | ICD-10-CM

## 2013-03-21 DIAGNOSIS — G4700 Insomnia, unspecified: Secondary | ICD-10-CM

## 2013-03-21 LAB — COMPREHENSIVE METABOLIC PANEL
Albumin: 4.4 g/dL (ref 3.5–5.2)
BUN: 9 mg/dL (ref 6–23)
CO2: 29 mEq/L (ref 19–32)
Calcium: 10 mg/dL (ref 8.4–10.5)
Glucose, Bld: 97 mg/dL (ref 70–99)
Potassium: 4.4 mEq/L (ref 3.5–5.3)
Sodium: 137 mEq/L (ref 135–145)
Total Protein: 6.6 g/dL (ref 6.0–8.3)

## 2013-03-21 LAB — POCT CBC
Granulocyte percent: 46.7 % (ref 37–80)
HCT, POC: 41 % (ref 37.7–47.9)
Hemoglobin: 12.9 g/dL (ref 12.2–16.2)
Lymph, poc: 3.5 — AB (ref 0.6–3.4)
MCH, POC: 28.9 pg (ref 27–31.2)
MCHC: 31.5 g/dL — AB (ref 31.8–35.4)
MCV: 91.9 fL (ref 80–97)
MID (cbc): 0.5 (ref 0–0.9)
MPV: 7.8 fL (ref 0–99.8)
POC Granulocyte: 3.5 (ref 2–6.9)
POC LYMPH PERCENT: 46.3 % (ref 10–50)
POC MID %: 7 % (ref 0–12)
Platelet Count, POC: 323 K/uL (ref 142–424)
RBC: 4.46 M/uL (ref 4.04–5.48)
RDW, POC: 14.1 %
WBC: 7.5 K/uL (ref 4.6–10.2)

## 2013-03-21 LAB — TSH: TSH: 3.112 u[IU]/mL (ref 0.350–4.500)

## 2013-03-21 MED ORDER — OXYCODONE HCL ER 60 MG PO T12A: 60.0000 mg | EXTENDED_RELEASE_TABLET | Freq: Two times a day (BID) | ORAL | Status: DC

## 2013-03-21 NOTE — Progress Notes (Signed)
Subjective:    Patient ID: [name], female    DOB: 06/28/1971, 42 y.o.   MRN: [id number]  HPI This 42 y.o. female presents for evaluation of insomnia.  Typical treatments ineffective since 03/13/2013.  Slept most of 03/14/2013 (car trip to [place]) and had difficulty waking up, but the couldn't sleep that night. 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 Can fall asleep, but unable to stay asleep >1 hour. Gets up during the night when she can't sleep-last night "fell asleep" standing up and bumped into the kitchen wall-which woke her.  She did not fall to the floor.  Feels exhausted.  HA last night, resolved.   Some nausea yesterday (took zofran x 2 doses with good results).  Feels warm, but no documented fever.  No increased congestion, no ST, ear pain, coughing.  Low back pain is stable. Oxycontin 60 mg filled 03/07/2013 #30 tabs, #2 remaining. Pharmacy will be closed tomorrow, so she needs to fill today.  Plans to continue the same dose. Does not need a refill of the Oxycodone 10/325 rescue med.  Review of Systems As above.    Objective:   Physical Exam Blood pressure 118/78, pulse 104, temperature 97.5 F (36.4 C), temperature source Oral, resp. rate 16, height 5\' 6"  (1.676 m), weight 184 lb (83.462 kg), SpO2 97.00%. Body mass index is 29.71 kg/(m^2). Well-developed, well nourished WF who is awake, alert and oriented, in NAD. HEENT: [place]/AT, PERRL, EOMI.  Sclera and conjunctiva are clear.  EAC are patent, TMs are normal in appearance. Nasal mucosa is pink and moist. OP is clear. Neck: supple, non-tender, no lymphadenopathy, thyromegaly. Heart: RRR, no murmur Lungs: normal effort, CTA Abdomen: normo-active bowel sounds, supple, non-tender, no mass or organomegaly. Back:  Tenderness in the lumbar region, her baseline. Extremities: no cyanosis, clubbing or edema. Skin: warm and dry without  rash. Psychologic: good mood and appropriate affect, normal speech and behavior.    Results for orders placed in visit on 03/21/13  POCT CBC      Result Value Range   WBC 7.5  4.6 - 10.2 K/uL   Lymph, poc 3.5 (*) 0.6 - 3.4   POC LYMPH PERCENT 46.3  10 - 50 %L   MID (cbc) 0.5  0 - 0.9   POC MID % 7.0  0 - 12 %M   POC Granulocyte 3.5  2 - 6.9   Granulocyte percent 46.7  37 - 80 %G   RBC 4.46  4.04 - 5.48 M/uL   Hemoglobin 12.9  12.2 - 16.2 g/dL   HCT, POC 40.9  81.1 - 47.9 %   MCV 91.9  80 - 97 fL   MCH, POC 28.9  27 - 31.2 pg   MCHC 31.5 (*) 31.8 - 35.4 g/dL   RDW, POC 91.4     Platelet Count, POC 323  142 - 424 K/uL   MPV 7.8  0 - 99.8 fL  GLUCOSE, POCT (MANUAL RESULT ENTRY)      Result Value Range   POC Glucose 83  70 - 99 mg/dl       Assessment & Plan:  Fatigue - Plan: POCT CBC, POCT glucose (manual entry), Comprehensive metabolic panel, TSH, Epstein-Barr virus VCA antibody panel  Insomnia - await lab results.  No additional prescriptions for now.  Not inclined to prescribe additional controlled substances, and patient is in agreement.  Lumbar back pain -  Plan: OxyCODONE HCl ER (OXYCONTIN) 60 MG T12A.  Next fill in 2 weeks.  [name], PA-C Physician Assistant-Certified [facility]

## 2013-03-21 NOTE — Patient Instructions (Signed)
Get plenty of rest and drink at least 64 ounces of water daily.  I will contact you with your lab results as soon as they are available.   If you have not heard from me in 1 week, please contact me.  The fastest way to get your results is to register for My Chart (see the instructions on the last page of this printout).

## 2013-03-24 LAB — EPSTEIN-BARR VIRUS VCA ANTIBODY PANEL: EBV VCA IgG: 257 U/mL — ABNORMAL HIGH (ref <18.0)

## 2013-03-25 ENCOUNTER — Ambulatory Visit (INDEPENDENT_AMBULATORY_CARE_PROVIDER_SITE_OTHER): Payer: 59 | Admitting: Physician Assistant

## 2013-03-25 VITALS — BP 118/78 | HR 96 | Temp 97.6°F | Resp 16 | Ht 66.0 in | Wt 191.0 lb

## 2013-03-25 DIAGNOSIS — R51 Headache: Secondary | ICD-10-CM

## 2013-03-25 DIAGNOSIS — R5381 Other malaise: Secondary | ICD-10-CM

## 2013-03-25 DIAGNOSIS — R5383 Other fatigue: Secondary | ICD-10-CM

## 2013-03-25 DIAGNOSIS — R110 Nausea: Secondary | ICD-10-CM

## 2013-03-25 MED ORDER — DOXYCYCLINE HYCLATE 100 MG PO CAPS: 100.0000 mg | ORAL_CAPSULE | Freq: Two times a day (BID) | ORAL | Status: DC

## 2013-03-25 NOTE — Progress Notes (Signed)
  Subjective:    Patient ID: [name], female    DOB: 1971-04-06, 42 y.o.   MRN: [id number]  HPI 42 year old female presents for recheck of nausea, fatigue, sore throat, and headache. Was seen on 03/21/13 and several labs drawn including mono titer, CMET, TSH, CBC, and glucose. Patient reports symptoms have not improved - in fact they seem to have worsened.  Admits her throat feels worse and her fatigue has increased.  States she was only awake for 6-7 hours this past weekend and has not had the energy to do any of her normal activities. In addition she has had increasing nausea with the need to take her Zofran 1-2 times daily.  Denies nasal congestion, sinus pain, PND, otalgia, abdominal pain, fever or chills. She has been taking Claritin daily for the last 7 days which has not helped at all. States this does not feel like a sinus infection or tonsillitis she has had in the past.  No known tick exposure but does have 6 dogs at home.     Review of Systems  Constitutional: Positive for chills. Negative for fever.  HENT: Positive for sore throat. Negative for ear pain, congestion, rhinorrhea, neck stiffness and postnasal drip.   Respiratory: Negative for cough, shortness of breath and wheezing.   Gastrointestinal: Positive for nausea. Negative for vomiting and abdominal pain.  Skin: Negative for rash.  Neurological: Positive for headaches. Negative for dizziness.       Objective:   Physical Exam  Constitutional: She is oriented to person, place, and time. She appears well-developed and well-nourished.  HENT:  Head: Normocephalic and atraumatic.  Right Ear: Hearing, tympanic membrane, external ear and ear canal normal.  Left Ear: Hearing, tympanic membrane, external ear and ear canal normal.  Mouth/Throat: Uvula is midline, oropharynx is clear and moist and mucous membranes are normal.  Eyes: Conjunctivae are normal.  Neck: Normal range of motion.  Cardiovascular: Normal rate, regular rhythm  and normal heart sounds.   Pulmonary/Chest: Effort normal and breath sounds normal.  Neurological: She is alert and oriented to person, place, and time.  Psychiatric: She has a normal mood and affect. Her behavior is normal. Judgment and thought content normal.          Assessment & Plan:   Other malaise and fatigue  Nausea alone  Headache  Will cover for possible tick-borne illness. Hold on titers today Doxycycline 100 mg bid x 10 days Follow up if symptoms worsen or fail to improve. Consider further lab testing if symptoms persist. Also possible residual viral illness vs mono infection from 6 weeks ago. Etiology unclear at this time.

## 2013-03-28 ENCOUNTER — Other Ambulatory Visit: Payer: Self-pay | Admitting: Physician Assistant

## 2013-04-02 ENCOUNTER — Other Ambulatory Visit: Payer: Self-pay | Admitting: Physician Assistant

## 2013-04-02 DIAGNOSIS — K5900 Constipation, unspecified: Secondary | ICD-10-CM

## 2013-04-03 ENCOUNTER — Encounter: Payer: Self-pay | Admitting: Physician Assistant

## 2013-04-03 ENCOUNTER — Other Ambulatory Visit: Payer: Self-pay | Admitting: Physician Assistant

## 2013-04-03 ENCOUNTER — Telehealth: Payer: Self-pay

## 2013-04-03 ENCOUNTER — Ambulatory Visit (INDEPENDENT_AMBULATORY_CARE_PROVIDER_SITE_OTHER): Payer: 59 | Admitting: Physician Assistant

## 2013-04-03 VITALS — BP 124/86 | HR 86 | Temp 98.5°F | Resp 16 | Ht 66.0 in | Wt 189.0 lb

## 2013-04-03 DIAGNOSIS — M545 Low back pain, unspecified: Secondary | ICD-10-CM

## 2013-04-03 DIAGNOSIS — F419 Anxiety disorder, unspecified: Secondary | ICD-10-CM

## 2013-04-03 DIAGNOSIS — G4700 Insomnia, unspecified: Secondary | ICD-10-CM

## 2013-04-03 DIAGNOSIS — F411 Generalized anxiety disorder: Secondary | ICD-10-CM

## 2013-04-03 DIAGNOSIS — K5900 Constipation, unspecified: Secondary | ICD-10-CM

## 2013-04-03 MED ORDER — LINACLOTIDE 290 MCG PO CAPS: 290.0000 ug | ORAL_CAPSULE | Freq: Every day | ORAL | Status: DC

## 2013-04-03 MED ORDER — ALPRAZOLAM ER 1 MG PO TB24: 1.0000 mg | ORAL_TABLET | Freq: Every day | ORAL | Status: DC

## 2013-04-03 MED ORDER — OXYCODONE HCL ER 15 MG PO T12A: 15.0000 mg | EXTENDED_RELEASE_TABLET | Freq: Two times a day (BID) | ORAL | Status: DC

## 2013-04-03 MED ORDER — SERTRALINE HCL 100 MG PO TABS: 100.0000 mg | ORAL_TABLET | Freq: Every day | ORAL | Status: DC

## 2013-04-03 MED ORDER — HYDROXYZINE PAMOATE 25 MG PO CAPS: 25.0000 mg | ORAL_CAPSULE | Freq: Every evening | ORAL | Status: DC | PRN

## 2013-04-03 MED ORDER — OXYCODONE-ACETAMINOPHEN 10-325 MG PO TABS: 1.0000 | ORAL_TABLET | ORAL | Status: DC | PRN

## 2013-04-03 MED ORDER — OXYCODONE HCL ER 40 MG PO T12A: 40.0000 mg | EXTENDED_RELEASE_TABLET | Freq: Two times a day (BID) | ORAL | Status: DC

## 2013-04-03 NOTE — Telephone Encounter (Signed)
Pt was seen and this was taken care of.

## 2013-04-03 NOTE — Telephone Encounter (Signed)
PT IS REQUESTING A REFILL ON OXYCONTIN TO GET FILLED BY TOMORROW. PT STATES WILL BE OUT BY WEEKEND AND HER PHARMACY  ([facility]) IS CLOSED ON THE WEEKEND

## 2013-04-03 NOTE — Telephone Encounter (Signed)
I received this message, and 2 My Chart messages, upon arrival today at 1 pm.  I responded via My Chart that i'd write for the Oxycontin, but needed to know how many of the current tablets she has remaining.  I was then notified that she had arrived at the office, requesting to see Denise Lennert, [name].  Ms [name] and I discussed the situation.  I am willing to write the Oxycontin (planned:  40 mg Q12 #30 and 15 mg Q12 #30 for a 2 week supply), but need to have the remaining counts of the pills she has.

## 2013-04-03 NOTE — Progress Notes (Signed)
Patient was here today. I performed a pill count in the patients presence. She had 5 tablets of Oxycontin 60 mg and she had 15 tablets of Oxycodone 5/325 left. I discussed with [name] and her prescriptions were renewed appropriately. Patient reminded of our office policy regarding controlled substances. Patient is asking for a renewal of her Alprazolam she is out. She has been taking tid. Patient indicates she has not been using the ativan. Patient advised to bring in her ativan bottle for inventory.

## 2013-04-03 NOTE — Patient Instructions (Signed)
[facility] Policy for Prescribing Controlled Substances (Revised 09/2012) 1. Prescriptions for controlled substances will be filled by ONE provider at [facility] with whom you have established and developed a plan for your care, including follow-up. 2. You are encouraged to schedule an appointment with your prescriber at our appointment center for follow-up visits whenever possible. 3. If you request a prescription for the controlled substance while at [facility] for an acute problem (with someone other than your regular prescriber), you MAY be given a ONE-TIME prescription for a 30-day supply of the controlled substance, to allow time for you to return to see your regular prescriber for additional prescriptions. 

## 2013-04-04 ENCOUNTER — Encounter: Payer: Self-pay | Admitting: Physician Assistant

## 2013-04-04 NOTE — Progress Notes (Signed)
[address], [place] [address]   Phone [phone]  Subjective:    Patient ID: Denise [name], female    DOB: 1971-07-20, 42 y.o.   MRN: [id number]  HPI  Pt presents to clinic for med refills.  She is here for her biweekly Rx for Oxycontin.  She is also out of her Xanax and almost out of her Valium.  She feels like there might have been some confusion on her last Rx of xanax and though it was written for qhs she was given #90 for a 30 day supply so she was unsure whether she was meant to have a 90d supply or she was supposed to take it tid, she feels like there was some discussion on her taking it tid so that is what she has been doing -she has also been taking her Valium bid.  She does stack her dose of Xanax in the evening and takes her 3 pills for the day in the last 2-3 h of the day and then she can sleep about 4 hrs.  She has been using the Valium but if she has to [name] one she wants to be on the Xanax because she is not having the nausea with the Oxycontin dose reduction that she was in the past.  Review of Systems     Objective:   Physical Exam  Vitals reviewed. Constitutional: She is oriented to person, place, and time. She appears well-developed and well-nourished.  HENT:  Head: Normocephalic and atraumatic.  Right Ear: External ear normal.  Left Ear: External ear normal.  Pulmonary/Chest: Effort normal.  Neurological: She is alert and oriented to person, place, and time.  Psychiatric: She has a normal mood and affect. Her behavior is normal. Judgment and thought content normal.          Assessment & Plan:   Anxiety state, unspecified - Plan: sertraline (ZOLOFT) 100 MG tablet - continue for now but will think about in the future trying cymbalta due to the anxiety and depression treatment as well as indication for chronic pain -- depending on her anxiety does with the hopefully improved sleep we will maybe add true prn immediate release Xanax but I think we are going to  have to be very careful due to substance abuse problems.  Insomnia - Plan: ALPRAZolam (XANAX XR) 1 MG 24 hr tablet, hydrOXYzine (VISTARIL) 25 MG capsule - will change to extended release of Xanax and add another sleep med to hopefully decrease the amount of benzo she needs but at the same time get her a longer nights rest due to long acting benzo.  Fatigue - pt does not have active mono - she has had it in the past - I suspect her fatigue is coming from her lack of sleep.  She may need a sleep study in the future.  Unspecified constipation - Plan: Linaclotide (LINZESS) 290 MCG CAPS - continue current mediations  Substance abuse - pt to continue her decrease in Oxycontin - d/w pt the very real problem of getting rid of 1 addictive med but going to another one.  Overall future thoughts for this patient.  I suspect we are going to have to be careful with any controlled substance and what I do not want to happen is to get her off her pain med addiction and start a benzo addiction and because of her substance abuse history this is very possible and she is already demonstrating signs due to changing the dose of her  meds and not getting clarification.  Future thoughts - change Zoloft to Cymbalta Insomnia - increase her Clonidine at night to help with sleep - this will also continue to help with her withdraw symptoms Increase her Atarax up to 100mg  for insomnia as well as it can be used for future nausea. Repeat trial of Trazodone due to it helping with sleep and wonder if the nightmares is from other med combinations she was on at the time. Getting her into Psychiatric (?[place] [facility])  Today I have told the patient that for the next month [name] or I can see her and write for her anxiety/insomnia meds but [name] will be the only one writing her for her pain medications.  She will see [place] weekl and meds will be counted and written weekly until we find a plan that works for her.  At this time  she understands and agrees with the above.  She will call in 3 days if her sleep has not improved and she will see me in 1 wk for recheck.  She also understands that in the future only [name] will be writing for controlled substances and that it a practice policy and we do not plan to continue breaking that policy.  [name] and I have discussed this plan and we both agree with the above.  Benny Lennert PA-C 04/04/2013 1:12 PM

## 2013-04-09 ENCOUNTER — Ambulatory Visit (INDEPENDENT_AMBULATORY_CARE_PROVIDER_SITE_OTHER): Payer: 59 | Admitting: Physician Assistant

## 2013-04-09 VITALS — BP 122/82 | HR 106 | Temp 98.3°F | Resp 16 | Ht 66.0 in | Wt 186.4 lb

## 2013-04-09 DIAGNOSIS — G4700 Insomnia, unspecified: Secondary | ICD-10-CM

## 2013-04-09 MED ORDER — HYDROXYZINE PAMOATE 25 MG PO CAPS: 75.0000 mg | ORAL_CAPSULE | Freq: Every evening | ORAL | Status: DC | PRN

## 2013-04-09 MED ORDER — ALPRAZOLAM ER 0.5 MG PO TB24: 1.0000 mg | ORAL_TABLET | Freq: Every day | ORAL | Status: DC

## 2013-04-09 NOTE — Progress Notes (Signed)
   [address] [place] [address]   Phone [phone]  Subjective:    Patient ID: [name], female    DOB: [birth year], 42 y.o.   MRN: [id number]  HPI  Pt presents to clinic for her weekly recheck.  Her sleep has slightly improved.  With the Vistaril she is able to go to sleep quickly but she is still only able to sleep for about 4 hrs at a time.  One night she only took 1 vistaril and went to sleep and then woke in 3 hrs and took another one and was able to sleep.  She is worried that she was taking Xanax 3mg  and we went down to Xanax 1mg  XR.  She has had a couple of stressful days but they did not seem to affect her sleep.  She has known sleep apnea but does not like the face mask and how it makes her feel.  Review of Systems     Objective:   Physical Exam  Vitals reviewed. Constitutional: She appears well-developed and well-nourished.  HENT:  Head: Normocephalic and atraumatic.  Right Ear: External ear normal.  Left Ear: External ear normal.  Pulmonary/Chest: Effort normal.  Skin: Skin is warm and dry.  Psychiatric: She has a normal mood and affect. Her behavior is normal. Judgment and thought content normal.       Assessment & Plan:  Insomnia - Pt brought in her meds and she has 2 pills of both left -   We will increase her Atarax to up to 100mg  in an 8 hr period - she will plan on taking Vistaril 50mg  at bedtime and then repeat 25-50mg  after 4h if she needs to it -- I have changed her Xanax XR to 0.5mg  and she can take 3 at a time to get a dose of 1.5mg  to see if that will help.  I think that the Vistaril is helping the most.  We are also going to get her set up for a CPAP fitting for a nasal cannula to see if she will have improved sleep when her OSA is treated.  We will contact [facility] for that.  She will recheck in 1 wk with [name] for recheck and meds count and Rx.  Plan: ALPRAZolam (XANAX XR) 0.5 MG 24 hr tablet, hydrOXYzine (VISTARIL) 25 MG capsule  Benny Lennert PA-C  04/09/2013 3:06 PM

## 2013-04-12 ENCOUNTER — Encounter: Payer: Self-pay | Admitting: Physician Assistant

## 2013-04-16 ENCOUNTER — Ambulatory Visit (INDEPENDENT_AMBULATORY_CARE_PROVIDER_SITE_OTHER): Payer: 59 | Admitting: Physician Assistant

## 2013-04-16 VITALS — BP 140/86 | HR 113 | Temp 98.5°F | Resp 18 | Ht 66.0 in | Wt 186.8 lb

## 2013-04-16 DIAGNOSIS — F411 Generalized anxiety disorder: Secondary | ICD-10-CM

## 2013-04-16 DIAGNOSIS — M545 Low back pain, unspecified: Secondary | ICD-10-CM

## 2013-04-16 DIAGNOSIS — G4700 Insomnia, unspecified: Secondary | ICD-10-CM

## 2013-04-16 DIAGNOSIS — G4733 Obstructive sleep apnea (adult) (pediatric): Secondary | ICD-10-CM

## 2013-04-16 DIAGNOSIS — F419 Anxiety disorder, unspecified: Secondary | ICD-10-CM

## 2013-04-16 DIAGNOSIS — K222 Esophageal obstruction: Secondary | ICD-10-CM

## 2013-04-16 MED ORDER — OXYCODONE HCL ER 15 MG PO T12A: 15.0000 mg | EXTENDED_RELEASE_TABLET | Freq: Two times a day (BID) | ORAL | Status: DC

## 2013-04-16 MED ORDER — HYDROXYZINE HCL 25 MG PO TABS: 25.0000 mg | ORAL_TABLET | Freq: Four times a day (QID) | ORAL | Status: DC | PRN

## 2013-04-16 MED ORDER — OXYCODONE HCL ER 40 MG PO T12A: 40.0000 mg | EXTENDED_RELEASE_TABLET | Freq: Two times a day (BID) | ORAL | Status: DC

## 2013-04-16 MED ORDER — ALPRAZOLAM ER 0.5 MG PO TB24: 1.0000 mg | ORAL_TABLET | Freq: Every day | ORAL | Status: DC

## 2013-04-16 MED ORDER — PANTOPRAZOLE SODIUM 40 MG PO TBEC: DELAYED_RELEASE_TABLET | ORAL | Status: DC

## 2013-04-16 NOTE — Patient Instructions (Addendum)
If you haven't heard from [facility] in the next 3 days regarding the CPAP set up (changing from face mask to nasal pillows), please call them (or this office).  Wear the splint on your LEFT wrist both at night, and during the day (and at work) to help reduce the symptoms.

## 2013-04-16 NOTE — Progress Notes (Signed)
  Subjective:    Patient ID: MAYO OWCZARZAK, female    DOB: June 18, 1971, 42 y.o.   MRN: [id number]  HPI This 42 y.o. female presents for evaluation of chronic low back pain, anxiety, insomnia.  She's had significant improvement in her insomnia using the vistaril (50 mg at HS, and then 50 mg 3-4 hours later when she wakes up). Has not yet heard from [facility] to modify the CPAP set up from face mask to nasal pillows. Pain is stable on current dose of Oxycontin (this dose x 2 weeks) and wants to stay on this dose for another 2 weeks, per our usual tapering schedule.  Oxycontin 40 mg, #5 pills remaining Oxycontin 15 mg, #5 pills remaining Alprazolam - out (needed 1.5 mg for effect) Did not bring oxycodone IR for breakthru pain, as she has plenty. Vistaril 25 mg,  #2 pills remaining (takes 2 at HS and 2 when she wakes up in the middle of the night). Had two nights of poor sleep this past weekend.  For the past 2-3 weeks, she's been managing her own medication supply again instead of her daughter.  Her daughter is still very involved in her healthcare.  LEFT hand CTS symptoms with increased hours at work.  Wearing splint at night, not during the day.  Doesn't want to have surgery on the LEFT, mostly out of concerns over increased pain medication use needed post-operatively.  Review of Systems Increased burning epigastric pain since ran out of protonix two days ago.  No CP, SOB, HA, dizziness.    Objective:   Physical Exam  Blood pressure 140/86, pulse 113, temperature 98.5 F (36.9 C), temperature source Oral, resp. rate 18, height 5\' 6"  (1.676 m), weight 186 lb 12.8 oz (84.732 kg), SpO2 100.00%. Body mass index is 30.16 kg/(m^2). Well-developed, well nourished WF who is awake, alert and oriented, in NAD. HEENT: [place]/AT, sclera and conjunctiva are clear.  EAC are patent, TMs are normal in appearance. Nasal mucosa is pink and moist. OP is clear. Neck: supple, non-tender, no lymphadenopathy,  thyromegaly. Heart: RRR, no murmur Lungs: normal effort, CTA Extremities: no cyanosis, clubbing or edema. Skin: warm and dry without rash. Neurologic: normal sensation of the lower extremities bilaterally, and DTRs are symmetrically strong. Good strength. Psychologic: good mood and appropriate affect, normal speech and behavior.       Assessment & Plan:  Insomnia - Plan: ALPRAZolam (XANAX XR) 0.5 MG 24 hr tablet, hydrOXYzine (ATARAX/VISTARIL) 25 MG tablet; in 2 weeks plan to reduce the alprazolam XR to 1 mg.  Lumbar back pain - Plan: OxyCODONE (OXYCONTIN) 15 mg T12A, OxyCODONE (OXYCONTIN) 40 mg T12A; consider reducing this dose to 50 mg at her next visit in 2 weeks, though not opposed to extending it another two weeks if needed.  Consider the addition of Lyrica or Neurontin if she has increased need for breakthrough doses.  Anxiety - Plan: ALPRAZolam (XANAX XR) 0.5 MG 24 hr tablet; as above.  Continue sertraline at current dose of 100 mg for now.  Consider change to Cymbalta in the future.  Esophageal stricture - Plan: pantoprazole (PROTONIX) 40 MG tablet  OSA (obstructive sleep apnea) - I will look into the delay with [facility].  [name], PA-C Physician Assistant-Certified [facility]

## 2013-04-23 ENCOUNTER — Other Ambulatory Visit: Payer: Self-pay | Admitting: Physician Assistant

## 2013-04-30 ENCOUNTER — Ambulatory Visit (INDEPENDENT_AMBULATORY_CARE_PROVIDER_SITE_OTHER): Payer: 59 | Admitting: Physician Assistant

## 2013-04-30 VITALS — BP 118/74 | HR 109 | Temp 98.0°F | Resp 16 | Ht 66.5 in | Wt 186.0 lb

## 2013-04-30 DIAGNOSIS — F411 Generalized anxiety disorder: Secondary | ICD-10-CM

## 2013-04-30 DIAGNOSIS — G4700 Insomnia, unspecified: Secondary | ICD-10-CM

## 2013-04-30 DIAGNOSIS — F419 Anxiety disorder, unspecified: Secondary | ICD-10-CM

## 2013-04-30 DIAGNOSIS — R197 Diarrhea, unspecified: Secondary | ICD-10-CM

## 2013-04-30 DIAGNOSIS — R194 Change in bowel habit: Secondary | ICD-10-CM

## 2013-04-30 DIAGNOSIS — M545 Low back pain, unspecified: Secondary | ICD-10-CM

## 2013-04-30 DIAGNOSIS — F988 Other specified behavioral and emotional disorders with onset usually occurring in childhood and adolescence: Secondary | ICD-10-CM

## 2013-04-30 MED ORDER — OXYCODONE HCL ER 10 MG PO T12A: 10.0000 mg | EXTENDED_RELEASE_TABLET | Freq: Two times a day (BID) | ORAL | Status: DC

## 2013-04-30 MED ORDER — OXYCODONE HCL ER 40 MG PO T12A: 40.0000 mg | EXTENDED_RELEASE_TABLET | Freq: Two times a day (BID) | ORAL | Status: DC

## 2013-04-30 MED ORDER — AMPHETAMINE-DEXTROAMPHETAMINE 30 MG PO TABS: 15.0000 mg | ORAL_TABLET | Freq: Three times a day (TID) | ORAL | Status: DC

## 2013-04-30 MED ORDER — ALPRAZOLAM ER 0.5 MG PO TB24: 1.0000 mg | ORAL_TABLET | Freq: Every day | ORAL | Status: DC

## 2013-04-30 MED ORDER — ALPRAZOLAM 1 MG PO TABS: 0.5000 mg | ORAL_TABLET | Freq: Two times a day (BID) | ORAL | Status: DC | PRN

## 2013-04-30 MED ORDER — OXYCODONE-ACETAMINOPHEN 10-325 MG PO TABS: 1.0000 | ORAL_TABLET | ORAL | Status: DC | PRN

## 2013-04-30 NOTE — Progress Notes (Signed)
Subjective:    Patient ID: [name], female    DOB: [birth year], 42 y.o.   MRN: [id number]  HPI This 42 y.o. female presents for refills of pain medications, Adderall and evaluation of GAD and insomnia after recent changes in her regimen.    Her pain is controlled on her current regimen without adverse effects or withdrawal symptoms.  She has been on the current dose x 4 weeks. Ready to reduce the dose. Appropriate pill counts:  Oxycontin 40 mg #7 (filled 04/18/2013, #30) Oxycontin 15 mg #7 (filled 04/18/2013, #30) Takes rescue 2-3 times daily.   Initially, her change from alprazolam immediate release to the XR and vistaril at HS and upon waking in several hours worked really well, but lately, "Sleep hasn't been great.  When I do sleep, I sleep well, but that's probably only 3 of seven nights." On the nights she doesn't sleep well, she falls asleep with 50mg  of vistaril, but then wakes a few hours later and the second dose doesn't help her go back to sleep.  Anxiety continues to be a part of her inability to return to sleep. Also, she asks for a few immediate release alprazolam to use for rescue for panic.  She and Ms. Weber discussed the possibility of this, of having "about 5 a month," when they last talked.  Has developed intermittent uncontrolled diarrhea associated with Linzess x 4 weeks.  Also lots of bloating and gas x 2 weeks.  On two occasions, she's had stools while sleeping.  Most of the time, she's aware, but the urge comes on too suddenly. She'd like to reduce the dose from 290 to 145.  Last visit with Dr. Arlyce Dice was 2013.  This is the first time she's had any problem with this treatment for constipation predominant IBS, and is worried that without it "I won't be able to go."  She hasn't identified any specific trigger for the symptoms, and other than immediately before having a stool, doesn't have any abdominal pain or cramping.  No nausea. No blood or mucous in the stool. No recent  travel.  No significant changes in her diet, exercise (though she joined a gym yesterday).  She is working more, a busier schedule, but denies any increased stress.  Her partner has been more irritable lately, which has been frustrating, but she doesn't think that's related. Her grandmother is ill today and currently in the ED, but has been relatively well since the symptoms began.  Past medical history, surgical history, family history, social history and problem list reviewed.  Review of Systems As above.    Objective:   Physical Exam Blood pressure 118/74, pulse 109, temperature 98 F (36.7 C), temperature source Oral, resp. rate 16, height 5' 6.5" (1.689 m), weight 186 lb (84.369 kg), SpO2 100.00%. Body mass index is 29.57 kg/(m^2). Well-developed, well nourished WF who is awake, alert and oriented, in NAD. HEENT: [place]/AT, sclera and conjunctiva are clear.   Neck: supple, non-tender, no lymphadenopathy, thyromegaly. Heart: RRR, no murmur Lungs: normal effort, CTA Abdomen: normo-active bowel sounds, supple, non-tender, no mass or organomegaly. Back:  Tenderness along the lumbar spine.  No paraspinous tenderness. Extremities: no cyanosis, clubbing or edema. Skin: warm and dry without rash. Neurologic:  Normal LE strength and sensation.  DTRs symmetrically strong. Psychologic: worriedmood and appropriate affect, normal speech and behavior.     Assessment & Plan:  Lumbar back pain - Plan: REDUCE dose to 50 mg Q12 hours using OxyCODONE (OXYCONTIN) 40 mg  T12A AND OxyCODONE (OXYCONTIN) 10 mg T12A (30 DAY SUPPLY OF EACH GIVEN); oxyCODONE-acetaminophen (PERCOCET) 10-325 MG per tablet up to TID for rescue.  Discussed the importance of NOT increasing the rescue as we reduce the XR.  Insomnia - Plan:  CONTINUE ALPRAZolam (XANAX XR) 0.5 MG 24 hr tablet 3 tabs (1.5 mg) QHS.  INCREASE Vistaril to 3 tabs (75 mg) at HS, and may repeat 3 tabs (75 mg) when she wakes up.  Anxiety - Plan: CONTINUE  ALPRAZolam (XANAX XR) 0.5 MG 24 hr tablet 3 tabs (1.5 mg) QHS, and ADD ALPRAZolam (XANAX) 1 MG tablet for rescue.  ADD (attention deficit disorder) - Plan: amphetamine-dextroamphetamine (ADDERALL) 30 MG tablet  Diarrhea/bowel habit changes/stool urgency - As she's been on this dose of Linzess for some time, I suspect something else is causing her symptoms.  Does not sound infectious.  Could be related to reduced dose of OxyContin, but she hasn't had GI symptoms with the last couple of dose reductions. She'll see if Dr. Marzetta Board office can provide her with samples of the lower dose of Linzess, though I advised her that he may want to evaluate her.  In two weeks, either RTC or call.  If sleep is still poor and anxiety symptoms remain a component, will start Cymbalta, which may also improve her pain symptoms.  RTC 2-4 weeks, sooner if needed.   [name], PA-C Physician Assistant-Certified [facility]

## 2013-04-30 NOTE — Patient Instructions (Signed)
Reduce the Oxycontin to 50 mg daily. Take the rescue oxycodone as needed, but not more than 3 times daily, and fewer doses is better. Increase the Vistaril to 75 mg at bedtime, and another 75 mg if you wake up. Contact Dr. Marzetta Board office to see if they have samples of the lower dose of Linzess (145 mg), though they may advise you go in for a visit.

## 2013-05-01 ENCOUNTER — Encounter: Payer: Self-pay | Admitting: Physician Assistant

## 2013-05-07 NOTE — Progress Notes (Signed)
This encounter was created in error - please disregard.

## 2013-05-23 ENCOUNTER — Ambulatory Visit (HOSPITAL_COMMUNITY)
Admission: RE | Admit: 2013-05-23 | Discharge: 2013-05-23 | Disposition: A | Discharge status: Home / Self Care | Payer: 59 | Source: Ambulatory Visit | Attending: Physician Assistant | Admitting: Physician Assistant

## 2013-05-23 DIAGNOSIS — Z1231 Encounter for screening mammogram for malignant neoplasm of breast: Secondary | ICD-10-CM | POA: Insufficient documentation

## 2013-05-26 ENCOUNTER — Ambulatory Visit (INDEPENDENT_AMBULATORY_CARE_PROVIDER_SITE_OTHER): Payer: 59 | Admitting: Physician Assistant

## 2013-05-26 VITALS — BP 114/80 | HR 121 | Temp 98.6°F | Resp 18 | Ht 66.0 in | Wt 186.0 lb

## 2013-05-26 DIAGNOSIS — M545 Low back pain, unspecified: Secondary | ICD-10-CM

## 2013-05-26 DIAGNOSIS — I10 Essential (primary) hypertension: Secondary | ICD-10-CM

## 2013-05-26 DIAGNOSIS — G4700 Insomnia, unspecified: Secondary | ICD-10-CM

## 2013-05-26 DIAGNOSIS — F988 Other specified behavioral and emotional disorders with onset usually occurring in childhood and adolescence: Secondary | ICD-10-CM

## 2013-05-26 DIAGNOSIS — F411 Generalized anxiety disorder: Secondary | ICD-10-CM

## 2013-05-26 DIAGNOSIS — F419 Anxiety disorder, unspecified: Secondary | ICD-10-CM

## 2013-05-26 MED ORDER — ALPRAZOLAM 1 MG PO TABS: 0.5000 mg | ORAL_TABLET | Freq: Two times a day (BID) | ORAL | Status: DC | PRN

## 2013-05-26 MED ORDER — HYDROXYZINE HCL 25 MG PO TABS: 25.0000 mg | ORAL_TABLET | Freq: Four times a day (QID) | ORAL | Status: DC | PRN

## 2013-05-26 MED ORDER — OXYCODONE HCL ER 15 MG PO T12A: 45.0000 mg | EXTENDED_RELEASE_TABLET | Freq: Two times a day (BID) | ORAL | Status: DC

## 2013-05-26 MED ORDER — AMLODIPINE BESYLATE 10 MG PO TABS: 10.0000 mg | ORAL_TABLET | Freq: Every day | ORAL | Status: DC

## 2013-05-26 MED ORDER — ALPRAZOLAM ER 0.5 MG PO TB24: 1.0000 mg | ORAL_TABLET | Freq: Every day | ORAL | Status: DC

## 2013-05-26 MED ORDER — AMPHETAMINE-DEXTROAMPHETAMINE 30 MG PO TABS: 15.0000 mg | ORAL_TABLET | Freq: Three times a day (TID) | ORAL | Status: DC

## 2013-05-26 NOTE — Progress Notes (Signed)
  Subjective:    Patient ID: [name], female    DOB: 1970-12-19, 42 y.o.   MRN: [id number]  HPI This 42 y.o. female presents for evaluation of chronic low back pain, ADD, insomnia, GAD, HTN.  Feels ready to lower Oxycontin dose from 50 mg Q12 hours to 45 mg Q12 hours.  Also, will need authorization to have Rx's filled early this month-she's taking her daughter to [place], [place] for her 21st birthday, and will be out of town when she's due for the refill.  Her current counts are correct.  Oxycontin 40 #9 Oxycontin 10 #9  Back pain has improved a little since starting on regular exercise routine (she joined a gym). Regimen includes core strengthening.  Knee pain persists, intermittent worsening.  Alprazolam ER 0.5 #10 (#84 on 05/01/2013 was a 28-day supply, rather than 30-day), taking 3 daily Alprazolam NONE remaining (only #5 given)  Vistaril #3 remaining (takes 3 at HS and 3 more if she wakes up during the night).  "Not perfect, but a lot better."  [facility] still hasn't called her for CPAP set-up. This has been several months now, with repeated referrals.  Stress at home.  She isn't sure she wants to remain in the long-term relationship. Unable to afford the house payments alone.  Currently, she and her partner are not speaking to each other.  Feels safe from physical harm. Daughter is supportive of her choice to leave, or stay if they can work through this.     Past medical history, surgical history, family history, social history and problem list reviewed.    Review of Systems No chest pain, SOB, HA, dizziness, vision change, N/V, diarrhea, constipation, dysuria, urinary urgency or frequency, or rash.     Objective:   Physical Exam Blood pressure 114/80, pulse 121, temperature 98.6 F (37 C), temperature source Oral, resp. rate 18, height 5\' 6"  (1.676 m), weight 186 lb (84.369 kg), SpO2 99.00%. Body mass index is 30.04 kg/(m^2). Well-developed, well nourished WF who is awake,  alert and oriented, in NAD. HEENT: [place]/AT, sclera and conjunctiva are clear.   Neck: supple, non-tender, no lymphadenopathy, thyromegaly. Heart: RRR, no murmur Lungs: normal effort, CTA Extremities: no cyanosis, clubbing or edema. FROM of both knees without crepitus.  Non-tender on palpation. Back: tenderness of the lumbar spine.  Mild tenderness of the paraspinous muscles on the RIGHT. Skin: warm and dry without rash. Psychologic: good mood and appropriate affect, sad when discussing her relationship, normal speech and behavior.        Assessment & Plan:  HTN (hypertension) - Controlled. Plan: amLODipine (NORVASC) 10 MG tablet  Insomnia - Improved. Plan: hydrOXYzine (ATARAX/VISTARIL) 25 MG tablet, ALPRAZolam (XANAX XR) 0.5 MG 24 hr tablet  Anxiety - Stable. Plan: ALPRAZolam (XANAX XR) 0.5 MG 24 hr tablet, ALPRAZolam (XANAX) 1 MG tablet  ADD (attention deficit disorder) - Controlled. Plan: amphetamine-dextroamphetamine (ADDERALL) 30 MG tablet  Lumbar back pain - Stable/improving. Plan: REDUCE dose to 45 mg PO Q12 hours with OxyCODONE (OXYCONTIN) 15 mg T12A 3 tabs PO Q12 hours  OSA - needs CPAP.  Will discuss with my referrals staff what has happened with [facility], what alternative we have (she has a balance with [facility] that she is currently unable to settle).  RTC 4 weeks, sooner PRN.  [name], PA-C Physician Assistant-Certified [facility]

## 2013-06-04 ENCOUNTER — Emergency Department (HOSPITAL_COMMUNITY)
Admission: EM | Admit: 2013-06-04 | Discharge: 2013-06-05 | Disposition: A | Discharge status: Home / Self Care | Payer: 59 | Attending: Emergency Medicine | Admitting: Emergency Medicine

## 2013-06-04 ENCOUNTER — Encounter (HOSPITAL_COMMUNITY): Payer: Self-pay

## 2013-06-04 DIAGNOSIS — Z8639 Personal history of other endocrine, nutritional and metabolic disease: Secondary | ICD-10-CM | POA: Insufficient documentation

## 2013-06-04 DIAGNOSIS — E669 Obesity, unspecified: Secondary | ICD-10-CM | POA: Insufficient documentation

## 2013-06-04 DIAGNOSIS — Z87442 Personal history of urinary calculi: Secondary | ICD-10-CM | POA: Insufficient documentation

## 2013-06-04 DIAGNOSIS — Z8719 Personal history of other diseases of the digestive system: Secondary | ICD-10-CM | POA: Insufficient documentation

## 2013-06-04 DIAGNOSIS — F319 Bipolar disorder, unspecified: Secondary | ICD-10-CM | POA: Insufficient documentation

## 2013-06-04 DIAGNOSIS — G43909 Migraine, unspecified, not intractable, without status migrainosus: Secondary | ICD-10-CM | POA: Insufficient documentation

## 2013-06-04 DIAGNOSIS — M79604 Pain in right leg: Secondary | ICD-10-CM

## 2013-06-04 DIAGNOSIS — R52 Pain, unspecified: Secondary | ICD-10-CM | POA: Insufficient documentation

## 2013-06-04 DIAGNOSIS — M79609 Pain in unspecified limb: Secondary | ICD-10-CM | POA: Insufficient documentation

## 2013-06-04 DIAGNOSIS — F17200 Nicotine dependence, unspecified, uncomplicated: Secondary | ICD-10-CM | POA: Insufficient documentation

## 2013-06-04 DIAGNOSIS — Z862 Personal history of diseases of the blood and blood-forming organs and certain disorders involving the immune mechanism: Secondary | ICD-10-CM | POA: Insufficient documentation

## 2013-06-04 DIAGNOSIS — K219 Gastro-esophageal reflux disease without esophagitis: Secondary | ICD-10-CM | POA: Insufficient documentation

## 2013-06-04 DIAGNOSIS — I10 Essential (primary) hypertension: Secondary | ICD-10-CM | POA: Insufficient documentation

## 2013-06-04 DIAGNOSIS — F411 Generalized anxiety disorder: Secondary | ICD-10-CM | POA: Insufficient documentation

## 2013-06-04 DIAGNOSIS — Z79899 Other long term (current) drug therapy: Secondary | ICD-10-CM | POA: Insufficient documentation

## 2013-06-04 MED ORDER — SODIUM CHLORIDE 0.9 % IV SOLN
Freq: Once | INTRAVENOUS | Status: AC
  Administered 2013-06-05: via INTRAVENOUS

## 2013-06-04 MED ORDER — VANCOMYCIN HCL IN DEXTROSE 1-5 GM/200ML-% IV SOLN
1000.0000 mg | Freq: Once | INTRAVENOUS | Status: AC
  Administered 2013-06-05: 1000 mg via INTRAVENOUS
  Filled 2013-06-04: qty 200

## 2013-06-04 MED ORDER — FENTANYL CITRATE 0.05 MG/ML IJ SOLN
50.0000 ug | Freq: Once | INTRAMUSCULAR | Status: AC
  Administered 2013-06-05: 50 ug via INTRAVENOUS
  Filled 2013-06-04: qty 2

## 2013-06-04 MED ORDER — ONDANSETRON HCL 4 MG/2ML IJ SOLN
4.0000 mg | Freq: Once | INTRAMUSCULAR | Status: AC
  Administered 2013-06-05: 4 mg via INTRAVENOUS
  Filled 2013-06-04: qty 2

## 2013-06-04 NOTE — ED Notes (Signed)
Pt complains of right leg swelling for three days, tonight she states is worse, right leg is warm to touch

## 2013-06-05 ENCOUNTER — Ambulatory Visit (HOSPITAL_COMMUNITY)
Admission: RE | Admit: 2013-06-05 | Discharge: 2013-06-05 | Disposition: A | Discharge status: Home / Self Care | Payer: 59 | Source: Ambulatory Visit | Attending: Emergency Medicine | Admitting: Emergency Medicine

## 2013-06-05 DIAGNOSIS — R52 Pain, unspecified: Secondary | ICD-10-CM

## 2013-06-05 DIAGNOSIS — M7989 Other specified soft tissue disorders: Secondary | ICD-10-CM

## 2013-06-05 DIAGNOSIS — M79609 Pain in unspecified limb: Secondary | ICD-10-CM

## 2013-06-05 LAB — POCT I-STAT, CHEM 8
BUN: 11 mg/dL (ref 6–23)
Chloride: 102 mEq/L (ref 96–112)
Potassium: 3.8 mEq/L (ref 3.5–5.1)
Sodium: 141 mEq/L (ref 135–145)
TCO2: 27 mmol/L (ref 0–100)

## 2013-06-05 LAB — CBC
MCH: 28.6 pg (ref 26.0–34.0)
MCV: 86.5 fL (ref 78.0–100.0)
Platelets: 243 10*3/uL (ref 150–400)
RDW: 12.3 % (ref 11.5–15.5)

## 2013-06-05 MED ORDER — ENOXAPARIN SODIUM 100 MG/ML ~~LOC~~ SOLN
1.0000 mg/kg | Freq: Once | SUBCUTANEOUS | Status: AC
  Administered 2013-06-05: 85 mg via SUBCUTANEOUS
  Filled 2013-06-05: qty 1

## 2013-06-05 MED ORDER — CLINDAMYCIN HCL 150 MG PO CAPS: 300.0000 mg | ORAL_CAPSULE | Freq: Three times a day (TID) | ORAL | Status: DC

## 2013-06-05 NOTE — ED Provider Notes (Signed)
CSN: [id number]     Arrival date & time 06/04/13  2302 History     First MD Initiated Contact with Patient 06/04/13 2346     Chief Complaint  Patient presents with  . Leg Swelling   (Consider location/radiation/quality/duration/timing/severity/associated sxs/prior Treatment) HPI History provided by patient. Right lower extremity swelling with onset 3 days ago is getting worse and tonight noticed is getting warm. No associated rash. No trauma. No fevers. No chest pain or shortness of breath. Symptoms moderate severity. Pain is dull and not radiating. No weakness or numbness.  Patient has strong family history of blood clots and is a smoker. She denies any personal history of DVT or PE. No recent travel, surgery. Past Medical History  Diagnosis Date  . Anxiety   . Depression   . GERD (gastroesophageal reflux disease)   . Esophageal stricture     s/p dilatation x 2  . Hyperlipemia   . Hypertension   . History of alcohol abuse     no alcohol since 2004  . Migraine headache   . History of cocaine abuse     none since 2011, entered treatment  . Bipolar disorder   . Pancreatitis, acute 04/2011  . Obesity   . Nephrolithiasis     x 4   Past Surgical History  Procedure Laterality Date  . Appendectomy    . Cholecystectomy    . Abdominal hysterectomy    . Cesarean section    . Knee surgery  2010    leftx3  . Nasal septum surgery    . Carpal tunnel release  07/29/2012    Procedure: CARPAL TUNNEL RELEASE;  Surgeon: Eldred Manges, MD;  Location: [facility];  Service: Orthopedics;  Laterality: Right;  Right carpal tunnel release   Family History  Problem Relation Age of Onset  . Colon cancer Neg Hx   . Heart disease Maternal Grandmother   . Kidney cancer Mother   . Hypertension Mother   . Cancer Mother     kidney  . Lung cancer Maternal Aunt   . Stroke Maternal Uncle 55  . Alcohol abuse Maternal Uncle     recovered  . Heart disease Maternal Uncle    History   Substance Use Topics  . Smoking status: Current Every Day Smoker -- 1.00 packs/day for 20 years    Types: Cigarettes  . Smokeless tobacco: Never Used  . Alcohol Use: No     Comment: none since 2004   OB History   Grav Para Term Preterm Abortions TAB SAB Ect Mult Living                 Review of Systems  Constitutional: Negative for fever and chills.  HENT: Negative for neck pain.   Eyes: Negative for visual disturbance.  Respiratory: Negative for shortness of breath.   Cardiovascular: Positive for leg swelling. Negative for chest pain.  Gastrointestinal: Negative for vomiting and abdominal pain.  Genitourinary: Negative for dysuria.  Musculoskeletal: Negative for back pain.  Skin: Negative for rash.  Neurological: Negative for headaches.  All other systems reviewed and are negative.    Allergies  Imitrex; Metoclopramide hcl; Morphine and related; Tegretol; Topamax; and Trazodone and nefazodone  Home Medications   Current Outpatient Rx  Name  Route  Sig  Dispense  Refill  . ALPRAZolam (XANAX XR) 0.5 MG 24 hr tablet   Oral   Take 2-3 tablets (1-1.5 mg total) by mouth at bedtime.   90 tablet  0     Patient leaving town this week.  Ok for early fill ...   . ALPRAZolam (XANAX) 1 MG tablet   Oral   Take 0.5-1 tablets (0.5-1 mg total) by mouth 2 (two) times daily as needed for anxiety. For breakthrough anxiety.   5 tablet   0   . amLODipine (NORVASC) 10 MG tablet   Oral   Take 1 tablet (10 mg total) by mouth daily.   90 tablet   1   . amphetamine-dextroamphetamine (ADDERALL) 30 MG tablet   Oral   Take 0.5 tablets (15 mg total) by mouth 3 (three) times daily.   45 tablet   0     Patient leaving town this week.  OK for early fill ...   . cloNIDine (CATAPRES) 0.1 MG tablet   Oral   Take 1 tablet (0.1 mg total) by mouth 3 (three) times daily.   90 tablet   1   . hydrOXYzine (ATARAX/VISTARIL) 25 MG tablet   Oral   Take 1-4 tablets (25-100 mg total) by  mouth every 6 (six) hours as needed for anxiety.   180 tablet   1   . lidocaine (LIDODERM) 5 %   Transdermal   Place 1 patch onto the skin daily. Remove & Discard patch within 12 hours or as directed by MD   30 patch   0   . metaxalone (SKELAXIN) 800 MG tablet   Oral   Take 1 tablet (800 mg total) by mouth 3 (three) times daily.   90 tablet   0   . OxyCODONE (OXYCONTIN) 15 mg T12A   Oral   Take 3 tablets (45 mg total) by mouth every 12 (twelve) hours.   174 tablet   0     Patient's dose reduced to 45 mg Q12 hours.  She ha ...   . pantoprazole (PROTONIX) 40 MG tablet      TAKE 1 TABLET BY MOUTH ONCE DAILY   90 tablet   3   . sertraline (ZOLOFT) 100 MG tablet   Oral   Take 1 tablet (100 mg total) by mouth daily.   90 tablet   0   . clindamycin (CLEOCIN) 150 MG capsule   Oral   Take 2 capsules (300 mg total) by mouth 3 (three) times daily.   21 capsule   0   . EPINEPHrine (EPIPEN) 0.3 mg/0.3 mL DEVI   Intramuscular   Inject 0.3 mLs (0.3 mg total) into the muscle as needed (inject at time of signs of anaphylaxis).   2 Device   0    BP 109/75  Pulse 74  Temp(Src) 98.5 F (36.9 C) (Oral)  Resp 18  Ht 5\' 6"  (1.676 m)  Wt 185 lb (83.915 kg)  BMI 29.87 kg/m2  SpO2 100% Physical Exam  Constitutional: She is oriented to person, place, and time. She appears well-developed and well-nourished.  HENT:  Head: Normocephalic and atraumatic.  Eyes: EOM are normal. Pupils are equal, round, and reactive to light.  Neck: Neck supple.  Cardiovascular: Normal rate, regular rhythm and intact distal pulses.   Pulmonary/Chest: Effort normal and breath sounds normal. No respiratory distress.  Abdominal: Soft. She exhibits no distension. There is no tenderness.  Musculoskeletal: Normal range of motion.  Right lower extremity with calf edema and mild increased warmth to touch. No palpable cords. No erythema. Distal neurovascular intact.  Neurological: She is alert and oriented  to person, place, and time.  Skin: Skin is  warm and dry.    ED Course   Procedures (including critical care time)  Results for orders placed during the hospital encounter of 06/04/13  CBC      Result Value Range   WBC 7.2  4.0 - 10.5 K/uL   RBC 3.99  3.87 - 5.11 MIL/uL   Hemoglobin 11.4 (*) 12.0 - 15.0 g/dL   HCT 78.2 (*) 95.6 - 21.3 %   MCV 86.5  78.0 - 100.0 fL   MCH 28.6  26.0 - 34.0 pg   MCHC 33.0  30.0 - 36.0 g/dL   RDW 08.6  57.8 - 46.9 %   Platelets 243  150 - 400 K/uL  POCT I-STAT, CHEM 8      Result Value Range   Sodium 141  135 - 145 mEq/L   Potassium 3.8  3.5 - 5.1 mEq/L   Chloride 102  96 - 112 mEq/L   BUN 11  6 - 23 mg/dL   Creatinine, Ser 6.29  0.50 - 1.10 mg/dL   Glucose, Bld 97  70 - 99 mg/dL   Calcium, Ion 5.28  1.12 - 1.23 mmol/L   TCO2 27  0 - 100 mmol/L   Hemoglobin 11.6 (*) 12.0 - 15.0 g/dL   HCT 41.3 (*) 24.4 - 01.0 %   IV antibiotics for possible cellulitis. Labs reviewed as above. IV fentanyl pain control Lovenox provided with ultrasound ordered to be done vascular lab this morning  Patient agrees to all discharge and followup instructions, is a reliable historian and agrees to take antibiotics as prescribed if her ultrasound does not show any DVT. Return precautions verbalized as understood.  1. Acute leg pain, right     MDM  Right lower extremity pain and edema - cellulitis versus DVT  Evaluated with labs reviewed as above creatinine is 0.9  IV antibiotics. IV narcotics. Lovenox subcutaneous  Vital signs and nursing notes reviewed and considered  Sunnie Nielsen, MD 06/05/13 [phone]

## 2013-06-05 NOTE — Progress Notes (Signed)
Right lower extremity venous duplex completed.  Right:  No evidence of DVT, superficial thrombosis, or Baker's cyst.  Left:  Negative for DVT in the common femoral vein.  

## 2013-06-08 ENCOUNTER — Encounter: Payer: Self-pay | Admitting: Physician Assistant

## 2013-06-09 ENCOUNTER — Encounter: Payer: Self-pay | Admitting: Physician Assistant

## 2013-06-10 MED ORDER — OXYCODONE-ACETAMINOPHEN 10-325 MG PO TABS: 1.0000 | ORAL_TABLET | ORAL | Status: DC | PRN

## 2013-06-10 NOTE — Telephone Encounter (Signed)
Pt requested a refill for her Percocet for breakthrough pain. Please put up front for patient to pick-up I have contacted her through mychart.

## 2013-06-13 ENCOUNTER — Telehealth: Payer: Self-pay

## 2013-06-13 NOTE — Telephone Encounter (Signed)
Sent records to [facility] at [phone] they will contact patient to schedule appt regarding cpap machine

## 2013-06-18 ENCOUNTER — Ambulatory Visit (INDEPENDENT_AMBULATORY_CARE_PROVIDER_SITE_OTHER): Payer: 59 | Admitting: Emergency Medicine

## 2013-06-18 ENCOUNTER — Ambulatory Visit: Payer: 59

## 2013-06-18 VITALS — BP 122/78 | HR 109 | Temp 98.3°F | Resp 18 | Ht 66.5 in | Wt 186.0 lb

## 2013-06-18 DIAGNOSIS — J069 Acute upper respiratory infection, unspecified: Secondary | ICD-10-CM

## 2013-06-18 DIAGNOSIS — F419 Anxiety disorder, unspecified: Secondary | ICD-10-CM

## 2013-06-18 DIAGNOSIS — F411 Generalized anxiety disorder: Secondary | ICD-10-CM

## 2013-06-18 DIAGNOSIS — M25561 Pain in right knee: Secondary | ICD-10-CM

## 2013-06-18 DIAGNOSIS — G4700 Insomnia, unspecified: Secondary | ICD-10-CM

## 2013-06-18 DIAGNOSIS — M25569 Pain in unspecified knee: Secondary | ICD-10-CM

## 2013-06-18 MED ORDER — IPRATROPIUM BROMIDE 0.03 % NA SOLN: 2.0000 | Freq: Two times a day (BID) | NASAL | Status: DC

## 2013-06-18 MED ORDER — DULOXETINE HCL 30 MG PO CPEP: ORAL_CAPSULE | ORAL | Status: DC

## 2013-06-18 NOTE — Progress Notes (Signed)
Subjective:    Patient ID: [name], female    DOB: July 12, 1971, 42 y.o.   MRN: [id number]  HPI This 42 y.o. female presents for evaluation of sore throat and headache, and several other issues.   Partner has "The creeping crud," and was evaluated here and diagnosed with bronchitis 4 days ago, given an antibiotic, Mucinex and a cough syrup, but without much improvement so far.  Fever/chills, Tmax 100.9.  Mild nausea, no vomiting/diarrhea.  No cough. Nasal/sinus pressure/congestion, "It's that hot head crap." Acetaminophen and ibuprofen with some mild relief.  Concerned that while her symptoms aren't particularly bad now, they will "go into something else."  Sleep continues to be a problem.  Vistaril 75 mg at HS helps her go to sleep, but it takes longer than it did initially, and she wakes up several hours later.  A second dose of 75 mg helps her fall back to sleep, but take a couple of hours. Anxiety still seems to be driving it.  Some episodic chest pain, Left upper chest, "pressure" lasting a few seconds.  No triggers identified.  Has occurred 3-4 times, wonders if it's related to GERD.  No associated nausea, belching, SOB, dizziness.  RIGHT knee feels like something is catching.  Pain is sever "until whatever it is goes back."  Unable to move the knee from the position it's in when it happens, it's "stuck." Happens when she's stepping over the pet gate, but can also happen with regular ambulation. Not interested in surgical intervention, or anything that would increase her need for narcotic pain medication.  Is interested in injections, if that would be helpful.  Had discontinued the OxyContin x 1 week.  Used a leftover Suboxone dose to help, and had about a week of nausea and GI upset, requiring absence from work.  Still uses immediate release oxycodone as before, with plans to reduce and eliminate that as well..  Medications and allergies reviewed. Past medical history, surgical history,  family history, social history and problem list reviewed.    Review of Systems As above.    Objective:   Physical Exam Blood pressure 122/78, pulse 109, temperature 98.3 F (36.8 C), temperature source Oral, resp. rate 18, height 5' 6.5" (1.689 m), weight 186 lb (84.369 kg), SpO2 98.00%. Body mass index is 29.57 kg/(m^2). Well-developed, well nourished WF who is awake, alert and oriented, in NAD. HEENT: [place]/AT, PERRL, EOMI.  Sclera and conjunctiva are clear.  EAC are patent, TMs are normal in appearance. Nasal mucosa is pink and moist. OP is clear. Neck: supple, non-tender, no lymphadenopathy, thyromegaly. Heart: RRR, no murmur Lungs: normal effort, CTA Extremities: no cyanosis, clubbing or edema. RIGHT knee is normal in appearance.  FROM.  Stable with anterior and posterior stress, varus and valgus stress.  Tenderness of the postero-medial joint line.  No catching with Apley's, but some tenderness of the medial joint line. Skin: warm and dry without rash. Psychologic: good mood and appropriate affect, normal speech and behavior.  RIGHT KNEE: [facility] reading (PRIMARY) by  Dr. Dareen Piano. Normal knee.        Assessment & Plan:  Viral URI - Plan: ipratropium (ATROVENT) 0.03 % nasal spray, OTC Mucinex.  Supportive care, anticipatory guidance.  If her symptoms do not begin to improve on day 6-7 of this illness (Sunday 8/17/Monday 8/19), she'll call.  Depending on the symptoms, I'd be inclined to call in antibiotic therapy for bronchitis or sinusitis.  Insomnia/Anxiety - Plan: DULoxetine (CYMBALTA) 30 MG capsule, increase  to 60 mg after 1 week.  Anticipatory guidance provided.  May need a temporary increase in the immediate release alprazolam while adjusting to this medication; she will call if needed.   Knee pain, right - Plan: DG Knee Complete 4 Views Right; refer to sports medicine for additional evaluation and management.  She hopes to avoid surgical intervention.  [name],  PA-C Physician Assistant-Certified [facility]

## 2013-06-20 ENCOUNTER — Telehealth: Payer: Self-pay

## 2013-06-20 ENCOUNTER — Other Ambulatory Visit: Payer: Self-pay | Admitting: Physician Assistant

## 2013-06-20 NOTE — Telephone Encounter (Signed)
Pt would like to have an antibiotic called in for her sinus infection, she is still experiencing pressure in her "head" and drainage. Best# [phone] [facility]

## 2013-06-25 MED ORDER — AMOXICILLIN-POT CLAVULANATE 875-125 MG PO TABS: 1.0000 | ORAL_TABLET | Freq: Two times a day (BID) | ORAL | Status: DC

## 2013-06-25 NOTE — Telephone Encounter (Signed)
Rx sent. Please advise this patient.  Meds ordered this encounter  Medications  . amoxicillin-clavulanate (AUGMENTIN) 875-125 MG per tablet    Sig: Take 1 tablet by mouth 2 (two) times daily.    Dispense:  20 tablet    Refill:  0    Order Specific Question:  Supervising Provider    Answer:  DOOLITTLE, ROBERT P [[id number]

## 2013-06-26 ENCOUNTER — Encounter: Payer: Self-pay | Admitting: Physician Assistant

## 2013-06-26 ENCOUNTER — Ambulatory Visit (INDEPENDENT_AMBULATORY_CARE_PROVIDER_SITE_OTHER): Payer: 59 | Admitting: Physician Assistant

## 2013-06-26 VITALS — BP 108/74 | HR 98 | Temp 98.3°F | Resp 18 | Ht 66.0 in | Wt 184.0 lb

## 2013-06-26 DIAGNOSIS — G4700 Insomnia, unspecified: Secondary | ICD-10-CM

## 2013-06-26 DIAGNOSIS — R110 Nausea: Secondary | ICD-10-CM

## 2013-06-26 DIAGNOSIS — F419 Anxiety disorder, unspecified: Secondary | ICD-10-CM

## 2013-06-26 DIAGNOSIS — F988 Other specified behavioral and emotional disorders with onset usually occurring in childhood and adolescence: Secondary | ICD-10-CM

## 2013-06-26 DIAGNOSIS — R52 Pain, unspecified: Secondary | ICD-10-CM

## 2013-06-26 DIAGNOSIS — F411 Generalized anxiety disorder: Secondary | ICD-10-CM

## 2013-06-26 MED ORDER — OXYCODONE-ACETAMINOPHEN 10-325 MG PO TABS: 1.0000 | ORAL_TABLET | ORAL | Status: DC | PRN

## 2013-06-26 MED ORDER — ONDANSETRON 8 MG PO TBDP: ORAL_TABLET | ORAL | Status: DC

## 2013-06-26 MED ORDER — ALPRAZOLAM 1 MG PO TABS: 0.5000 mg | ORAL_TABLET | Freq: Two times a day (BID) | ORAL | Status: DC | PRN

## 2013-06-26 MED ORDER — AMPHETAMINE-DEXTROAMPHETAMINE 30 MG PO TABS: 15.0000 mg | ORAL_TABLET | Freq: Three times a day (TID) | ORAL | Status: DC

## 2013-06-26 MED ORDER — ALPRAZOLAM ER 0.5 MG PO TB24: 1.0000 mg | ORAL_TABLET | Freq: Every day | ORAL | Status: DC

## 2013-06-26 NOTE — Telephone Encounter (Signed)
Pt is here today to be seen. 

## 2013-06-26 NOTE — Progress Notes (Signed)
  Subjective:    Patient ID: [name], female    DOB: 04-Aug-1971, 42 y.o.   MRN: [id number]  HPI This 42 y.o. female presents for evaluation of back and knee pain, anxiety with insomnia, and ADD.  Needs refills of Percocet, Alprazolam and Adderall.   Has D/C'd OxyContin.  Has needed 3-5 Percocet daily since then, with a goal to reduce and eliminate that, as well. Is still adjusting to not being on the long-acting product.  Increased pain with increased activity.  Is now planning to proceed with surgical options for the knee (suspect meniscal tear), while she's still taking the percocet regularly, so that she doesn't have to come off twice.  Uses Alprazolam XR.  Hasn't started the Cymbalta yet, concerned that it would exacerbate the difficulty of stopping the OxyContin, but plans to start it soon.  Uses up to 5 alprazolam (immediate release/month). Requests a refill of Zofran, as well, in preparation for starting the Cymbalta.  Adderall is working well for ADD.  No adverse effects.  Sinus symptoms persist (see OV 06/18/2013).  Augmentin was sent in last night, but she hasn't picked it up yet.  Meds, allergies, Past medical history, surgical history, family history, social history and problem list reviewed.    Review of Systems As above.    Objective:   Physical Exam Blood pressure 108/74, pulse 98, temperature 98.3 F (36.8 C), temperature source Oral, resp. rate 18, height 5\' 6"  (1.676 m), weight 184 lb (83.462 kg), SpO2 99.00%. Body mass index is 29.71 kg/(m^2). Well-developed, well nourished WF who is awake, alert and oriented, in NAD. HEENT: [place]/AT, sclera and conjunctiva are clear.  EAC are patent, TMs are normal in appearance. Nasal mucosa is pink and moist. OP is clear. Neck: supple, non-tender, no lymphadenopathy, thyromegaly. Heart: RRR, no murmur Lungs: normal effort, CTA Extremities: no cyanosis, clubbing or edema. Skin: warm and dry without rash. Psychologic: good mood and  appropriate affect, normal speech and behavior.        Assessment & Plan:  Insomnia/Anxiety - Plan: ALPRAZolam (XANAX XR) 0.5 MG 24 hr tablet/ALPRAZolam (XANAX) 1 MG tablet; start the Cymbalta when she's able.  ADD (attention deficit disorder) - Plan: amphetamine-dextroamphetamine (ADDERALL) 30 MG tablet  Pain - Plan: oxyCODONE-acetaminophen (PERCOCET) 10-325 MG per tablet.  Reduce use as able, with the understanding that she'll likely increase the frequency again presuming she has a knee arthroscopy for presumed meniscal tear.  Then, we can reduce the dose gradually.  Nausea alone - Plan: ondansetron (ZOFRAN-ODT) 8 MG disintegrating tablet; anticipating nausea when she starts the Cymbalta.  [name], PA-C Physician Assistant-Certified [facility]

## 2013-06-26 NOTE — Patient Instructions (Signed)
The antibiotic for your sinuses is at the pharmacy for you. Keep up the good work! Start the Cymbalta as soon as you are ready,  It will help your anxiety, and probably also your pain.

## 2013-07-06 ENCOUNTER — Ambulatory Visit (INDEPENDENT_AMBULATORY_CARE_PROVIDER_SITE_OTHER): Payer: 59 | Admitting: Physician Assistant

## 2013-07-06 ENCOUNTER — Telehealth: Payer: Self-pay

## 2013-07-06 VITALS — BP 128/88 | HR 110 | Temp 98.0°F | Resp 17 | Ht 66.5 in | Wt 184.0 lb

## 2013-07-06 DIAGNOSIS — R21 Rash and other nonspecific skin eruption: Secondary | ICD-10-CM

## 2013-07-06 DIAGNOSIS — B009 Herpesviral infection, unspecified: Secondary | ICD-10-CM

## 2013-07-06 LAB — HIV ANTIBODY (ROUTINE TESTING W REFLEX): HIV: NONREACTIVE

## 2013-07-06 MED ORDER — LIDOCAINE HCL 2 % EX GEL: CUTANEOUS | Status: DC | PRN

## 2013-07-06 MED ORDER — VALACYCLOVIR HCL 1 G PO TABS: 1000.0000 mg | ORAL_TABLET | Freq: Two times a day (BID) | ORAL | Status: DC

## 2013-07-06 MED ORDER — TRAMADOL HCL 50 MG PO TABS: 50.0000 mg | ORAL_TABLET | Freq: Four times a day (QID) | ORAL | Status: DC | PRN

## 2013-07-06 NOTE — Progress Notes (Signed)
  Subjective:    Patient ID: [name], female    DOB: 1971-03-06, 42 y.o.   MRN: [id number]  HPI 42 year old female presents with 2 day history of painful "bump" on mons pubis. States it appeared acutely 2 days ago and seems to be worsening.  Describes it as extremely painful, like a searing pain. Admits she has been taking her pain medication for this but is trying to be very careful as she has just recently weaned off oxycodone.  Pain medication does help but since it was getting worse decided to come in for evaluation.  Had clindamycin at home and has taken 2 doses which she thinks may have helped some.  When it first erupted it was a blister that drained clear fluid.  Since has turned into multiple small lesions that are painful. No purulent drainage noted.  No fevers, chills, or inguinal adenopathy that she can tell.  No hx of cold sores or genital herpes. Has been monogamous for 6 years with 1 female partner.  Admits they are having a rough patch.  She is otherwise doing well with no other concerns today.     Review of Systems  Constitutional: Negative for fever and chills.  Gastrointestinal: Negative for abdominal pain.  Skin: Positive for rash.  Neurological: Negative for headaches.       Objective:   Physical Exam  Constitutional: She is oriented to person, place, and time. She appears well-developed and well-nourished.  HENT:  Head: Normocephalic and atraumatic.  Right Ear: External ear normal.  Left Ear: External ear normal.  Eyes: Conjunctivae are normal.  Neck: Normal range of motion.  Cardiovascular: Normal rate.   Pulmonary/Chest: Effort normal.  Neurological: She is alert and oriented to person, place, and time.  Skin:     Psychiatric: She has a normal mood and affect. Her behavior is normal. Judgment and thought content normal.          Assessment & Plan:  Herpes simplex - Plan: valACYclovir (VALTREX) 1000 MG tablet, lidocaine (XYLOCAINE) 2 % jelly, HIV  antibody  Rash and nonspecific skin eruption - Plan: HSV(herpes simplex vrs) 1+2 ab-IgG, Herpes simplex virus culture, Wound culture, HIV antibody  Viral and wound cultures collected. Discussed in detail with patient that the likely etiology of this is HSV. I went over in length the treatment options as well as the causes.  Will start Valtrex 1 gram bid x 10 days. A separate rx was sent to the pharmacy for her to start suppressive therapy once this has cleared. Patient understands and is agreeable with this plan. She plans to await lab results and will plan to come in and see [name] to discuss them in detail.    Face to face time with patient 25 minutes

## 2013-07-06 NOTE — Telephone Encounter (Signed)
Pt notified and rx sent to [facility].

## 2013-07-06 NOTE — Telephone Encounter (Signed)
Pt was seen today by heather, and states was to get an rx for tramadol, but did not get one. Please call pt to advise

## 2013-07-07 ENCOUNTER — Encounter: Payer: Self-pay | Admitting: Physician Assistant

## 2013-07-07 DIAGNOSIS — S161XXA Strain of muscle, fascia and tendon at neck level, initial encounter: Secondary | ICD-10-CM

## 2013-07-07 DIAGNOSIS — S83249A Other tear of medial meniscus, current injury, unspecified knee, initial encounter: Secondary | ICD-10-CM

## 2013-07-07 HISTORY — DX: Other tear of medial meniscus, current injury, unspecified knee, initial encounter: S83.249A

## 2013-07-07 HISTORY — DX: Strain of muscle, fascia and tendon at neck level, initial encounter: S16.1XXA

## 2013-07-08 ENCOUNTER — Ambulatory Visit (INDEPENDENT_AMBULATORY_CARE_PROVIDER_SITE_OTHER): Payer: 59 | Admitting: Physician Assistant

## 2013-07-08 VITALS — BP 118/66 | HR 107 | Temp 98.3°F | Resp 18 | Ht 66.0 in | Wt 183.6 lb

## 2013-07-08 DIAGNOSIS — M25561 Pain in right knee: Secondary | ICD-10-CM

## 2013-07-08 DIAGNOSIS — M2390 Unspecified internal derangement of unspecified knee: Secondary | ICD-10-CM

## 2013-07-08 DIAGNOSIS — M25569 Pain in unspecified knee: Secondary | ICD-10-CM

## 2013-07-08 LAB — HSV(HERPES SIMPLEX VRS) I + II AB-IGG
HSV 1 Glycoprotein G Ab, IgG: 0.1 IV
HSV 2 Glycoprotein G Ab, IgG: 7.44 IV — ABNORMAL HIGH

## 2013-07-08 LAB — WOUND CULTURE
Gram Stain: NONE SEEN
Gram Stain: NONE SEEN
Organism ID, Bacteria: NO GROWTH

## 2013-07-08 MED ORDER — OXYCODONE-ACETAMINOPHEN 10-325 MG PO TABS: 1.0000 | ORAL_TABLET | ORAL | Status: DC | PRN

## 2013-07-08 NOTE — Progress Notes (Signed)
  Subjective:    Patient ID: [name], female    DOB: 11/02/1971, 42 y.o.   MRN: [id number]  HPI This 42 y.o. female presents for refill of Percocet.  Dr. Eulah Pont plans arthroscopy of the RIGHT knee on 07/24/2013.  She's increased her use of oxycodone due to the pain of her knee, in addition to a pain from a lesion on the mons pubis that developed last week, and needs a refill.  She received #90 on 06/26/13 and has exhausted that supply, taking the med Q4 hours. Dr. Eulah Pont also advised her that she's going to need carpal tunnel release of the LEFT wrist (due to muscle atrophy), but won't do it at the same time as her knee due to the need for crutches after the knee procedure.   She's had considerable stress the past week, relationship, parenting, her mother's dog had to be put to sleep, and then this morning she was awakened by her neighbor's children, hysterical that the neighbor was unresponsive on the living room floor.  [name] and her partner, [name] (who is an ED nurse) performed CPR until EMS arrived, but without success.    She feels frustrated and overwhelmed and concerned about prolonging her need for narcotic pain medication.  Review of Systems As above.    Objective:   Physical Exam Blood pressure 118/66, pulse 107, temperature 98.3 F (36.8 C), temperature source Oral, resp. rate 18, height 5\' 6"  (1.676 m), weight 183 lb 9.6 oz (83.28 kg), SpO2 100.00%. Body mass index is 29.65 kg/(m^2). Well-developed, well nourished WF who is awake, alert and oriented, in NAD. HEENT: [place]/AT, sclera and conjunctiva are clear.   Neck: supple, non-tender, no lymphadenopathy, thyromegaly. Lungs: normal effort Extremities: no cyanosis, clubbing or edema. RIGHT knee without edema or erythema.  Tenderness consistent with medial meniscal tear.  Also pain posteriorly. Ambulates with a limp, favoring the RIGHT knee. Skin: warm and dry without rash. Psychologic: good mood and appropriate affect, normal speech  and behavior.        Assessment & Plan:  Unspecified internal derangement of knee  Knee pain, right - Plan: oxyCODONE-acetaminophen (PERCOCET) 10-325 MG per tablet   Refilled Percocet, 10/325 1 PO Q4H PRN, #90, no refills.  This is a 15-day supply.  She will not take more than prescribed.  She has Tramadol to see if she can reduce her need for percocet, at least until the procedure 9/18. I will plan to prescribe #90 again in 15 days, to carry her through her surgery.  [name], PA-C Physician Assistant-Certified [facility]

## 2013-07-08 NOTE — Patient Instructions (Signed)
Use the tramadol as able for your pain, reserving the percocet for severe pain not relieved by the tramadol. Contact me for a refill in 2 weeks, just before your surgery.

## 2013-07-09 LAB — HERPES SIMPLEX VIRUS CULTURE: Organism ID, Bacteria: DETECTED

## 2013-07-16 ENCOUNTER — Other Ambulatory Visit: Payer: Self-pay | Admitting: Orthopedic Surgery

## 2013-07-17 ENCOUNTER — Ambulatory Visit (INDEPENDENT_AMBULATORY_CARE_PROVIDER_SITE_OTHER): Payer: 59 | Admitting: Physician Assistant

## 2013-07-17 ENCOUNTER — Emergency Department (HOSPITAL_COMMUNITY)
Admission: EM | Admit: 2013-07-17 | Discharge: 2013-07-17 | Disposition: A | Discharge status: Home / Self Care | Payer: 59 | Attending: Emergency Medicine | Admitting: Emergency Medicine

## 2013-07-17 ENCOUNTER — Encounter (HOSPITAL_COMMUNITY): Payer: Self-pay | Admitting: Emergency Medicine

## 2013-07-17 VITALS — BP 122/78 | HR 100 | Temp 99.2°F | Resp 18 | Ht 66.0 in | Wt 180.0 lb

## 2013-07-17 DIAGNOSIS — K219 Gastro-esophageal reflux disease without esophagitis: Secondary | ICD-10-CM | POA: Insufficient documentation

## 2013-07-17 DIAGNOSIS — Z90710 Acquired absence of both cervix and uterus: Secondary | ICD-10-CM | POA: Insufficient documentation

## 2013-07-17 DIAGNOSIS — F319 Bipolar disorder, unspecified: Secondary | ICD-10-CM | POA: Insufficient documentation

## 2013-07-17 DIAGNOSIS — Z79899 Other long term (current) drug therapy: Secondary | ICD-10-CM | POA: Insufficient documentation

## 2013-07-17 DIAGNOSIS — R51 Headache: Secondary | ICD-10-CM

## 2013-07-17 DIAGNOSIS — R1013 Epigastric pain: Secondary | ICD-10-CM

## 2013-07-17 DIAGNOSIS — R110 Nausea: Secondary | ICD-10-CM | POA: Insufficient documentation

## 2013-07-17 DIAGNOSIS — G43909 Migraine, unspecified, not intractable, without status migrainosus: Secondary | ICD-10-CM | POA: Insufficient documentation

## 2013-07-17 DIAGNOSIS — I10 Essential (primary) hypertension: Secondary | ICD-10-CM | POA: Insufficient documentation

## 2013-07-17 DIAGNOSIS — E669 Obesity, unspecified: Secondary | ICD-10-CM | POA: Insufficient documentation

## 2013-07-17 DIAGNOSIS — R454 Irritability and anger: Secondary | ICD-10-CM

## 2013-07-17 DIAGNOSIS — Z862 Personal history of diseases of the blood and blood-forming organs and certain disorders involving the immune mechanism: Secondary | ICD-10-CM | POA: Insufficient documentation

## 2013-07-17 DIAGNOSIS — Z87442 Personal history of urinary calculi: Secondary | ICD-10-CM | POA: Insufficient documentation

## 2013-07-17 DIAGNOSIS — Z9089 Acquired absence of other organs: Secondary | ICD-10-CM | POA: Insufficient documentation

## 2013-07-17 DIAGNOSIS — F17200 Nicotine dependence, unspecified, uncomplicated: Secondary | ICD-10-CM | POA: Insufficient documentation

## 2013-07-17 DIAGNOSIS — F411 Generalized anxiety disorder: Secondary | ICD-10-CM | POA: Insufficient documentation

## 2013-07-17 DIAGNOSIS — Z8639 Personal history of other endocrine, nutritional and metabolic disease: Secondary | ICD-10-CM | POA: Insufficient documentation

## 2013-07-17 LAB — URINALYSIS, ROUTINE W REFLEX MICROSCOPIC
Bilirubin Urine: NEGATIVE
Ketones, ur: NEGATIVE mg/dL
Nitrite: NEGATIVE
Protein, ur: NEGATIVE mg/dL
Urobilinogen, UA: 0.2 mg/dL (ref 0.0–1.0)
pH: 6 (ref 5.0–8.0)

## 2013-07-17 LAB — POCT CBC
Granulocyte percent: 53 %G (ref 37–80)
MID (cbc): 0.3 (ref 0–0.9)
MPV: 7.7 fL (ref 0–99.8)
POC Granulocyte: 3.3 (ref 2–6.9)
POC LYMPH PERCENT: 42.1 %L (ref 10–50)
Platelet Count, POC: 321 10*3/uL (ref 142–424)
RDW, POC: 13.5 %

## 2013-07-17 LAB — COMPREHENSIVE METABOLIC PANEL
AST: 18 U/L (ref 0–37)
AST: 20 U/L (ref 0–37)
Albumin: 4.6 g/dL (ref 3.5–5.2)
Alkaline Phosphatase: 77 U/L (ref 39–117)
BUN: 11 mg/dL (ref 6–23)
CO2: 24 mEq/L (ref 19–32)
Calcium: 9.8 mg/dL (ref 8.4–10.5)
Creatinine, Ser: 0.75 mg/dL (ref 0.50–1.10)
GFR calc non Af Amer: >90 mL/min (ref >90)
Potassium: 3.7 mEq/L (ref 3.5–5.3)
Sodium: 138 mEq/L (ref 135–145)
Total Protein: 7.2 g/dL (ref 6.0–8.3)

## 2013-07-17 LAB — CBC WITH DIFFERENTIAL/PLATELET
Basophils Absolute: 0.1 10*3/uL (ref 0.0–0.1)
Basophils Relative: 1 % (ref 0–1)
Eosinophils Relative: 1 % (ref 0–5)
HCT: 38.7 % (ref 36.0–46.0)
Lymphocytes Relative: 42 % (ref 12–46)
MCHC: 35.7 g/dL (ref 30.0–36.0)
MCV: 84.3 fL (ref 78.0–100.0)
Monocytes Absolute: 0.4 10*3/uL (ref 0.1–1.0)
Monocytes Relative: 5 % (ref 3–12)
RDW: 13.4 % (ref 11.5–15.5)

## 2013-07-17 LAB — LIPASE, BLOOD: Lipase: 39 U/L (ref 11–59)

## 2013-07-17 MED ORDER — SUCRALFATE 1 G PO TABS: 1.0000 g | ORAL_TABLET | Freq: Four times a day (QID) | ORAL | Status: DC

## 2013-07-17 MED ORDER — HYDROMORPHONE HCL PF 1 MG/ML IJ SOLN
1.0000 mg | Freq: Once | INTRAMUSCULAR | Status: AC
  Administered 2013-07-17: 1 mg via INTRAMUSCULAR
  Filled 2013-07-17: qty 1

## 2013-07-17 MED ORDER — DICYCLOMINE HCL 20 MG PO TABS: 20.0000 mg | ORAL_TABLET | Freq: Four times a day (QID) | ORAL | Status: DC

## 2013-07-17 MED ORDER — GI COCKTAIL ~~LOC~~
30.0000 mL | Freq: Once | ORAL | Status: AC
  Administered 2013-07-17: 30 mL via ORAL
  Filled 2013-07-17: qty 30

## 2013-07-17 NOTE — ED Provider Notes (Signed)
Medical screening examination/treatment/procedure(s) were performed by non-physician practitioner and as supervising physician I was immediately available for consultation/collaboration.   Gwyneth Sprout, MD 07/17/13 [phone]

## 2013-07-17 NOTE — Progress Notes (Signed)
Subjective:    Patient ID: [name], female    DOB: 07/24/1971, 42 y.o.   MRN: [id number]  HPI This 42 y.o. female presents for evaluation of abdominal pain, and also hs headache and questions about serotonin syndrome with sertraline and tramadol.  Searing epigastric abdominal pain x 3 days. Similar to what she's had before.  She has a history of esophageal stricture, s/p multiple dilations, associated with this kind of pain previously.  Bentyl has helped, along with anti-emetics until she could get in with GI.  She also has a history of pancreatitis after ERCP several years ago.  The pain is different from reflux, not burning, more like a spasm.  HA-constant, x  about 1 month, different from migraine, ?Valtrex.  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  She thinks the HA may also be related to stress, since it starts in the back of her head, as she's had a lot of increased stress lately.  That said, she notes that her relationship stress has improved in the past few days.  The conversation with her partner about the new diagnosis of HSV "went in a different direction" than she expected.    She has a lot of irritability and anger, for which she takes sertraline.  Recently she's been prescribed tramadol for pain, as she's trying to limit the use of percocet prior to upcoming knee arthroscopy.  A pharmacist mentioned the increased risk of serotonin syndrome with the combination, so when she takes tramadol, she doesn't take the sertraline.  Unfortunately, she gets irritable and angry and "Tend to do terrible things."  Medications, allergies, past medical history, surgical history, family history, social history and problem list reviewed.  Review of Systems As above. Stools are loose, not new.    Objective:   Physical Exam Blood pressure 122/78, pulse 100, temperature 99.2 F (37.3 C), temperature source Oral, resp. rate 18, height 5\' 6"  (1.676  m), weight 180 lb (81.647 kg), SpO2 100.00%. Body mass index is 29.07 kg/(m^2). Well-developed, well nourished WF who is awake, alert and oriented, in NAD. HEENT: [place]/AT, sclera and conjunctiva are clear.   Neck: supple, non-tender, no lymphadenopathy, thyromegaly. Heart: RRR, no murmur Lungs: normal effort, CTA Abdomen: normo-active bowel sounds, supple, no mass or organomegaly. Epigastric tenderness.  No rebound or referred tenderness.   Extremities: no cyanosis, clubbing or edema. Skin: warm and dry without rash. Psychologic: good mood and appropriate affect, normal speech and behavior.    Results for orders placed in visit on 07/17/13  POCT CBC      Result Value Range   WBC 6.3  4.6 - 10.2 K/uL   Lymph, poc 2.7  0.6 - 3.4   POC LYMPH PERCENT 42.1  10 - 50 %L   MID (cbc) 0.3  0 - 0.9   POC MID % 4.9  0 - 12 %M   POC Granulocyte 3.3  2 - 6.9   Granulocyte percent 53.0  37 - 80 %G   RBC 4.51  4.04 - 5.48 M/uL   Hemoglobin 13.0  12.2 - 16.2 g/dL   HCT, POC 40.9  81.1 - 47.9 %   MCV 90.5  80 - 97 fL   MCH, POC 28.8  27 - 31.2 pg   MCHC 31.9  31.8 - 35.4 g/dL   RDW, POC 91.4     Platelet Count, POC 321  142 - 424 K/uL   MPV  7.7  0 - 99.8 fL       Assessment & Plan:  Abdominal pain, acute, epigastric - Plan: Ambulatory referral to Gastroenterology, POCT CBC, Comprehensive metabolic panel, Amylase, Lipase, dicyclomine (BENTYL) 20 MG tablet  Headache(784.0) - hold Valtrex.  If HA resolves, we can consider alternate suppressive therapy to see if she tolerates it better.  She has cyclobenzaprine at home, and I encourage her to try it, along with a heating pad.  If HA persists, will need additional evaluation.  Irritability and anger - I encourage her to take the sertraline daily, even on the days that she takes tramadol.  We reviewed the symptoms of serotonin syndrome and she will monitor for them.  [name], PA-C Physician Assistant-Certified [facility]

## 2013-07-17 NOTE — Patient Instructions (Signed)
Stay well hydrated. Fiber in your diet will help with loose stools. Try stopping the Valtrex to see if the headache resolves. Take the Zoloft every day.

## 2013-07-17 NOTE — ED Provider Notes (Signed)
CSN: [id number]     Arrival date & time 07/17/13  1250 History   First MD Initiated Contact with Patient 07/17/13 1345     Chief Complaint  Patient presents with  . Abdominal Pain  . Nausea   (Consider location/radiation/quality/duration/timing/severity/associated sxs/prior Treatment) HPI Patient is a 42 year old female who presents to emergency department with complaint of epigastric abdominal pain. Patient states pain began 3 days ago. Pain is epigastric. States does not radiate. Pain is sharp. Pain is constant and is not worsened with eating. Patient states she's taken Percocet on daily basis for her pain and states it has not been helping her abdominal pain. Patient also takes daily protonic since she has not missed any doses. Patient denies any nausea, vomiting, changes in bowels. Patient denies any urinary symptoms. Patient denies any fever, chills, malaise. Patient states she has had similar pain in the past and states that was due to light, and bile ducts spasms and she had to have stents placed. Patient states that was in 2012. Patient reports prior cholecystectomy and history of pancreatitis. Past Medical History  Diagnosis Date  . Anxiety   . Depression   . GERD (gastroesophageal reflux disease)   . Esophageal stricture     s/p dilatation x 2  . Hyperlipemia   . Hypertension   . History of alcohol abuse     no alcohol since 2004  . Migraine headache   . History of cocaine abuse     none since 2011, entered treatment  . Bipolar disorder   . Pancreatitis, acute 04/2011  . Obesity   . Nephrolithiasis     x 4   Past Surgical History  Procedure Laterality Date  . Appendectomy    . Cholecystectomy    . Abdominal hysterectomy    . Cesarean section    . Knee surgery  2010    leftx3  . Nasal septum surgery    . Carpal tunnel release  07/29/2012    Procedure: CARPAL TUNNEL RELEASE;  Surgeon: Eldred Manges, MD;  Location: [facility];  Service: Orthopedics;   Laterality: Right;  Right carpal tunnel release   Family History  Problem Relation Age of Onset  . Colon cancer Neg Hx   . Heart disease Maternal Grandmother   . Kidney cancer Mother   . Hypertension Mother   . Cancer Mother     kidney  . Lung cancer Maternal Aunt   . Stroke Maternal Uncle 55  . Alcohol abuse Maternal Uncle     recovered  . Heart disease Maternal Uncle    History  Substance Use Topics  . Smoking status: Current Every Day Smoker -- 1.00 packs/day for 20 years    Types: Cigarettes  . Smokeless tobacco: Never Used  . Alcohol Use: No     Comment: none since 2004   OB History   Grav Para Term Preterm Abortions TAB SAB Ect Mult Living                 Review of Systems  Constitutional: Negative for fever, chills and appetite change.  HENT: Negative for neck pain and neck stiffness.   Respiratory: Negative for cough, chest tightness and shortness of breath.   Cardiovascular: Negative for chest pain, palpitations and leg swelling.  Gastrointestinal: Positive for abdominal pain. Negative for nausea, vomiting, diarrhea and blood in stool.  Genitourinary: Negative for dysuria and flank pain.  Neurological: Negative for dizziness, weakness and headaches.  All other systems reviewed  and are negative.    Allergies  Imitrex; Effexor; Metoclopramide hcl; Morphine and related; Tegretol; Topamax; and Trazodone and nefazodone  Home Medications   Current Outpatient Rx  Name  Route  Sig  Dispense  Refill  . ALPRAZolam (XANAX XR) 0.5 MG 24 hr tablet   Oral   Take 1-1.5 mg by mouth at bedtime as needed (for anxiety/sleep).         . ALPRAZolam (XANAX) 1 MG tablet   Oral   Take 0.5-1 tablets (0.5-1 mg total) by mouth 2 (two) times daily as needed for anxiety. For breakthrough anxiety.   5 tablet   0   . amLODipine (NORVASC) 10 MG tablet   Oral   Take 1 tablet (10 mg total) by mouth daily.   90 tablet   1   . amphetamine-dextroamphetamine (ADDERALL) 30 MG  tablet   Oral   Take 0.5 tablets (15 mg total) by mouth 3 (three) times daily.   45 tablet   0   . cloNIDine (CATAPRES) 0.1 MG tablet   Oral   Take 1 tablet (0.1 mg total) by mouth 3 (three) times daily.   90 tablet   1   . dicyclomine (BENTYL) 20 MG tablet   Oral   Take 1 tablet (20 mg total) by mouth every 6 (six) hours.   60 tablet   1   . EPINEPHrine (EPIPEN) 0.3 mg/0.3 mL DEVI   Intramuscular   Inject 0.3 mLs (0.3 mg total) into the muscle as needed (inject at time of signs of anaphylaxis).   2 Device   0   . hydrOXYzine (ATARAX/VISTARIL) 25 MG tablet   Oral   Take 25-100 mg by mouth at bedtime as needed (for sleep).         Marland Kitchen ibuprofen (ADVIL,MOTRIN) 200 MG tablet   Oral   Take 800 mg by mouth every 6 (six) hours as needed for pain.         Marland Kitchen lidocaine (LIDODERM) 5 %   Transdermal   Place 1 patch onto the skin daily as needed (For pain). Remove & Discard patch within 12 hours or as directed by MD         . metaxalone (SKELAXIN) 800 MG tablet   Oral   Take 800 mg by mouth 3 (three) times daily as needed for pain.         Marland Kitchen ondansetron (ZOFRAN-ODT) 8 MG disintegrating tablet   Oral   Take 8 mg by mouth every 8 (eight) hours as needed for nausea.         Marland Kitchen oxyCODONE-acetaminophen (PERCOCET) 10-325 MG per tablet   Oral   Take 1 tablet by mouth every 4 (four) hours as needed for pain.   90 tablet   0     Increased frequency of use due to meniscal tear an ...   . pantoprazole (PROTONIX) 40 MG tablet   Oral   Take 40 mg by mouth daily.         . sertraline (ZOLOFT) 100 MG tablet   Oral   Take 1 tablet (100 mg total) by mouth daily.   90 tablet   0   . traMADol (ULTRAM) 50 MG tablet   Oral   Take 1 tablet (50 mg total) by mouth every 6 (six) hours as needed for pain.   30 tablet   0   . valACYclovir (VALTREX) 1000 MG tablet   Oral   Take 1,000 mg by mouth daily.  BP 134/89  Pulse 123  Temp(Src) 98.3 F (36.8 C) (Oral)   Resp 18  SpO2 100% Physical Exam  Nursing note and vitals reviewed. Constitutional: She appears well-developed and well-nourished. No distress.  HENT:  Head: Normocephalic.  Eyes: Conjunctivae are normal.  Cardiovascular: Normal rate, regular rhythm and normal heart sounds.   Pulmonary/Chest: Effort normal and breath sounds normal. No respiratory distress. She has no wheezes. She has no rales.  Abdominal: Soft. Bowel sounds are normal. She exhibits no distension. There is tenderness. There is no rebound and no guarding.  Epigastric tenderness.  Musculoskeletal: She exhibits no edema.  Neurological: She is alert.  Skin: Skin is warm and dry.  Psychiatric: She has a normal mood and affect. Her behavior is normal.    ED Course  Procedures (including critical care time) Labs Review Results for orders placed during the hospital encounter of 07/17/13  CBC WITH DIFFERENTIAL      Result Value Range   WBC 8.2  4.0 - 10.5 K/uL   RBC 4.59  3.87 - 5.11 MIL/uL   Hemoglobin 13.8  12.0 - 15.0 g/dL   HCT 30.8  65.7 - 84.6 %   MCV 84.3  78.0 - 100.0 fL   MCH 30.1  26.0 - 34.0 pg   MCHC 35.7  30.0 - 36.0 g/dL   RDW 96.2  95.2 - 84.1 %   Platelets 302  150 - 400 K/uL   Neutrophils Relative % 52  43 - 77 %   Neutro Abs 4.3  1.7 - 7.7 K/uL   Lymphocytes Relative 42  12 - 46 %   Lymphs Abs 3.4  0.7 - 4.0 K/uL   Monocytes Relative 5  3 - 12 %   Monocytes Absolute 0.4  0.1 - 1.0 K/uL   Eosinophils Relative 1  0 - 5 %   Eosinophils Absolute 0.1  0.0 - 0.7 K/uL   Basophils Relative 1  0 - 1 %   Basophils Absolute 0.1  0.0 - 0.1 K/uL  COMPREHENSIVE METABOLIC PANEL      Result Value Range   Sodium 140  135 - 145 mEq/L   Potassium 3.7  3.5 - 5.1 mEq/L   Chloride 104  96 - 112 mEq/L   CO2 24  19 - 32 mEq/L   Glucose, Bld 100 (*) 70 - 99 mg/dL   BUN 11  6 - 23 mg/dL   Creatinine, Ser 3.24  0.50 - 1.10 mg/dL   Calcium 9.8  8.4 - 40.1 mg/dL   Total Protein 7.7  6.0 - 8.3 g/dL   Albumin 4.2  3.5  - 5.2 g/dL   AST 20  0 - 37 U/L   ALT 16  0 - 35 U/L   Alkaline Phosphatase 77  39 - 117 U/L   Total Bilirubin 0.3  0.3 - 1.2 mg/dL   GFR calc non Af Amer >90  >90 mL/min   GFR calc Af Amer >90  >90 mL/min  LIPASE, BLOOD      Result Value Range   Lipase 39  11 - 59 U/L  URINALYSIS, ROUTINE W REFLEX MICROSCOPIC      Result Value Range   Color, Urine YELLOW  YELLOW   APPearance CLEAR  CLEAR   Specific Gravity, Urine 1.008  1.005 - 1.030   pH 6.0  5.0 - 8.0   Glucose, UA NEGATIVE  NEGATIVE mg/dL   Hgb urine dipstick NEGATIVE  NEGATIVE   Bilirubin Urine NEGATIVE  NEGATIVE  Ketones, ur NEGATIVE  NEGATIVE mg/dL   Protein, ur NEGATIVE  NEGATIVE mg/dL   Urobilinogen, UA 0.2  0.0 - 1.0 mg/dL   Nitrite NEGATIVE  NEGATIVE   Leukocytes, UA NEGATIVE  NEGATIVE     Imaging Review No results found.  MDM   1. Epigastric pain     Patient with epigastric abdominal pain for 3 days with no guarding and exam. She appears to be in no distress. She is tachycardic but also appears very anxious. She is afebrile. Her lab work is normal with normal LFTs, normal white count, and normal lipase. Differential for patient's pain includes gastritis, peptic ulcer disease, pancreatitis, common bile back spasms. She was treated emergency department with a shot of Dilaudid and GI cocktail. Pt's pain went down to 4/10 after medications. Given her blood work and exam findings I do not think patient has an acute abdomen and do not think that further testing is needed at this time. Discussed this with Dr. Anitra Lauth. Patient does have a gastroenterologist Dr. Arlyce Dice who she will followup with on the outpatient basis. She is instructed to continue to take her protonic, take her pain medications, will add Carafate. Patient instructed to return if she's worsening.  Filed Vitals:   07/17/13 1252 07/17/13 1516  BP: 134/89   Pulse: 123 101  Temp: 98.3 F (36.8 C)   TempSrc: Oral   Resp: 18   SpO2: 100% 99%        Telesha Deguzman A Niyana Chesbro, PA-C 07/17/13 1520

## 2013-07-17 NOTE — ED Notes (Signed)
Pt c/o upper abd pain same as when had pancreatitis in past; pt sts had ERCP in past and pain in same area

## 2013-07-20 ENCOUNTER — Encounter: Payer: Self-pay | Admitting: Physician Assistant

## 2013-07-21 ENCOUNTER — Encounter: Payer: Self-pay | Admitting: Physician Assistant

## 2013-07-21 ENCOUNTER — Emergency Department (HOSPITAL_COMMUNITY)
Admission: EM | Admit: 2013-07-21 | Discharge: 2013-07-22 | Disposition: A | Discharge status: Home / Self Care | Payer: 59 | Attending: Emergency Medicine | Admitting: Emergency Medicine

## 2013-07-21 ENCOUNTER — Encounter (HOSPITAL_COMMUNITY): Payer: Self-pay | Admitting: Family Medicine

## 2013-07-21 ENCOUNTER — Other Ambulatory Visit: Payer: Self-pay | Admitting: Physician Assistant

## 2013-07-21 DIAGNOSIS — Z98811 Dental restoration status: Secondary | ICD-10-CM | POA: Insufficient documentation

## 2013-07-21 DIAGNOSIS — I10 Essential (primary) hypertension: Secondary | ICD-10-CM | POA: Insufficient documentation

## 2013-07-21 DIAGNOSIS — M5412 Radiculopathy, cervical region: Secondary | ICD-10-CM | POA: Insufficient documentation

## 2013-07-21 DIAGNOSIS — F1021 Alcohol dependence, in remission: Secondary | ICD-10-CM | POA: Insufficient documentation

## 2013-07-21 DIAGNOSIS — K219 Gastro-esophageal reflux disease without esophagitis: Secondary | ICD-10-CM | POA: Insufficient documentation

## 2013-07-21 DIAGNOSIS — F329 Major depressive disorder, single episode, unspecified: Secondary | ICD-10-CM | POA: Insufficient documentation

## 2013-07-21 DIAGNOSIS — E669 Obesity, unspecified: Secondary | ICD-10-CM | POA: Insufficient documentation

## 2013-07-21 DIAGNOSIS — Z9889 Other specified postprocedural states: Secondary | ICD-10-CM | POA: Insufficient documentation

## 2013-07-21 DIAGNOSIS — Z8619 Personal history of other infectious and parasitic diseases: Secondary | ICD-10-CM | POA: Insufficient documentation

## 2013-07-21 DIAGNOSIS — F3289 Other specified depressive episodes: Secondary | ICD-10-CM | POA: Insufficient documentation

## 2013-07-21 DIAGNOSIS — F411 Generalized anxiety disorder: Secondary | ICD-10-CM | POA: Insufficient documentation

## 2013-07-21 DIAGNOSIS — Z87828 Personal history of other (healed) physical injury and trauma: Secondary | ICD-10-CM | POA: Insufficient documentation

## 2013-07-21 DIAGNOSIS — Z79899 Other long term (current) drug therapy: Secondary | ICD-10-CM | POA: Insufficient documentation

## 2013-07-21 DIAGNOSIS — G43909 Migraine, unspecified, not intractable, without status migrainosus: Secondary | ICD-10-CM | POA: Insufficient documentation

## 2013-07-21 DIAGNOSIS — R197 Diarrhea, unspecified: Secondary | ICD-10-CM | POA: Insufficient documentation

## 2013-07-21 DIAGNOSIS — F17200 Nicotine dependence, unspecified, uncomplicated: Secondary | ICD-10-CM | POA: Insufficient documentation

## 2013-07-21 DIAGNOSIS — M129 Arthropathy, unspecified: Secondary | ICD-10-CM | POA: Insufficient documentation

## 2013-07-21 LAB — COMPREHENSIVE METABOLIC PANEL
Albumin: 3.5 g/dL (ref 3.5–5.2)
Alkaline Phosphatase: 68 U/L (ref 39–117)
BUN: 6 mg/dL (ref 6–23)
Creatinine, Ser: 0.58 mg/dL (ref 0.50–1.10)
Potassium: 3.3 mEq/L — ABNORMAL LOW (ref 3.5–5.1)
Total Protein: 6.6 g/dL (ref 6.0–8.3)

## 2013-07-21 LAB — CBC WITH DIFFERENTIAL/PLATELET
Basophils Relative: 0 % (ref 0–1)
Eosinophils Absolute: 0.1 10*3/uL (ref 0.0–0.7)
Hemoglobin: 12.1 g/dL (ref 12.0–15.0)
MCH: 28.4 pg (ref 26.0–34.0)
MCHC: 33.6 g/dL (ref 30.0–36.0)
Monocytes Relative: 6 % (ref 3–12)
Neutrophils Relative %: 42 % — ABNORMAL LOW (ref 43–77)
RDW: 13.2 % (ref 11.5–15.5)

## 2013-07-21 MED ORDER — KETOROLAC TROMETHAMINE 30 MG/ML IJ SOLN
30.0000 mg | Freq: Once | INTRAMUSCULAR | Status: AC
  Administered 2013-07-21: 30 mg via INTRAVENOUS
  Filled 2013-07-21: qty 1

## 2013-07-21 MED ORDER — DEXAMETHASONE SODIUM PHOSPHATE 10 MG/ML IJ SOLN
10.0000 mg | Freq: Once | INTRAMUSCULAR | Status: AC
  Administered 2013-07-21: 10 mg via INTRAVENOUS
  Filled 2013-07-21: qty 1

## 2013-07-21 MED ORDER — POTASSIUM CHLORIDE CRYS ER 20 MEQ PO TBCR
40.0000 meq | EXTENDED_RELEASE_TABLET | Freq: Once | ORAL | Status: AC
  Administered 2013-07-21: 40 meq via ORAL
  Filled 2013-07-21: qty 2

## 2013-07-21 MED ORDER — SODIUM CHLORIDE 0.9 % IV BOLUS (SEPSIS)
1000.0000 mL | Freq: Once | INTRAVENOUS | Status: AC
  Administered 2013-07-21: 1000 mL via INTRAVENOUS

## 2013-07-21 MED ORDER — HYDROMORPHONE HCL PF 1 MG/ML IJ SOLN
1.0000 mg | Freq: Once | INTRAMUSCULAR | Status: AC
  Administered 2013-07-21: 1 mg via INTRAVENOUS
  Filled 2013-07-21: qty 1

## 2013-07-21 MED ORDER — ONDANSETRON HCL 4 MG/2ML IJ SOLN
4.0000 mg | Freq: Once | INTRAMUSCULAR | Status: AC
  Administered 2013-07-21: 4 mg via INTRAVENOUS
  Filled 2013-07-21: qty 2

## 2013-07-21 NOTE — ED Notes (Signed)
Patient states that she has had neck pain radiating down her left arm since Saturday. Pain is worse with movement. Also c/o diarrhea since last night.

## 2013-07-21 NOTE — Progress Notes (Signed)
This encounter was created in error - please disregard.

## 2013-07-22 ENCOUNTER — Encounter (HOSPITAL_COMMUNITY): Payer: Self-pay | Admitting: Emergency Medicine

## 2013-07-22 ENCOUNTER — Emergency Department (HOSPITAL_COMMUNITY): Payer: 59

## 2013-07-22 ENCOUNTER — Emergency Department (HOSPITAL_COMMUNITY)
Admission: EM | Admit: 2013-07-22 | Discharge: 2013-07-22 | Disposition: A | Discharge status: Home / Self Care | Payer: 59 | Attending: Emergency Medicine | Admitting: Emergency Medicine

## 2013-07-22 DIAGNOSIS — M542 Cervicalgia: Secondary | ICD-10-CM | POA: Insufficient documentation

## 2013-07-22 DIAGNOSIS — Z8619 Personal history of other infectious and parasitic diseases: Secondary | ICD-10-CM | POA: Insufficient documentation

## 2013-07-22 DIAGNOSIS — F411 Generalized anxiety disorder: Secondary | ICD-10-CM | POA: Insufficient documentation

## 2013-07-22 DIAGNOSIS — Z888 Allergy status to other drugs, medicaments and biological substances status: Secondary | ICD-10-CM | POA: Insufficient documentation

## 2013-07-22 DIAGNOSIS — Z98811 Dental restoration status: Secondary | ICD-10-CM | POA: Insufficient documentation

## 2013-07-22 DIAGNOSIS — I10 Essential (primary) hypertension: Secondary | ICD-10-CM | POA: Insufficient documentation

## 2013-07-22 DIAGNOSIS — Z885 Allergy status to narcotic agent status: Secondary | ICD-10-CM | POA: Insufficient documentation

## 2013-07-22 DIAGNOSIS — F17200 Nicotine dependence, unspecified, uncomplicated: Secondary | ICD-10-CM | POA: Insufficient documentation

## 2013-07-22 DIAGNOSIS — F1411 Cocaine abuse, in remission: Secondary | ICD-10-CM | POA: Insufficient documentation

## 2013-07-22 DIAGNOSIS — Z8669 Personal history of other diseases of the nervous system and sense organs: Secondary | ICD-10-CM | POA: Insufficient documentation

## 2013-07-22 DIAGNOSIS — Z87442 Personal history of urinary calculi: Secondary | ICD-10-CM | POA: Insufficient documentation

## 2013-07-22 DIAGNOSIS — Z79899 Other long term (current) drug therapy: Secondary | ICD-10-CM | POA: Insufficient documentation

## 2013-07-22 DIAGNOSIS — K219 Gastro-esophageal reflux disease without esophagitis: Secondary | ICD-10-CM | POA: Insufficient documentation

## 2013-07-22 DIAGNOSIS — M129 Arthropathy, unspecified: Secondary | ICD-10-CM | POA: Insufficient documentation

## 2013-07-22 DIAGNOSIS — F329 Major depressive disorder, single episode, unspecified: Secondary | ICD-10-CM | POA: Insufficient documentation

## 2013-07-22 DIAGNOSIS — F3289 Other specified depressive episodes: Secondary | ICD-10-CM | POA: Insufficient documentation

## 2013-07-22 DIAGNOSIS — Z9889 Other specified postprocedural states: Secondary | ICD-10-CM | POA: Insufficient documentation

## 2013-07-22 DIAGNOSIS — G43909 Migraine, unspecified, not intractable, without status migrainosus: Secondary | ICD-10-CM | POA: Insufficient documentation

## 2013-07-22 DIAGNOSIS — F1011 Alcohol abuse, in remission: Secondary | ICD-10-CM | POA: Insufficient documentation

## 2013-07-22 MED ORDER — LORAZEPAM 2 MG/ML IJ SOLN
1.0000 mg | Freq: Once | INTRAMUSCULAR | Status: AC
  Administered 2013-07-22: 2 mg via INTRAVENOUS
  Filled 2013-07-22: qty 1

## 2013-07-22 MED ORDER — SODIUM CHLORIDE 0.9 % IV BOLUS (SEPSIS)
1000.0000 mL | Freq: Once | INTRAVENOUS | Status: AC
  Administered 2013-07-22: 1000 mL via INTRAVENOUS

## 2013-07-22 MED ORDER — PROCHLORPERAZINE EDISYLATE 5 MG/ML IJ SOLN
10.0000 mg | Freq: Once | INTRAMUSCULAR | Status: AC
  Administered 2013-07-22: 10 mg via INTRAVENOUS
  Filled 2013-07-22: qty 2

## 2013-07-22 MED ORDER — DIPHENHYDRAMINE HCL 50 MG/ML IJ SOLN
25.0000 mg | Freq: Once | INTRAMUSCULAR | Status: AC
  Administered 2013-07-22: 50 mg via INTRAVENOUS
  Filled 2013-07-22: qty 1

## 2013-07-22 MED ORDER — HYDROMORPHONE HCL PF 1 MG/ML IJ SOLN
1.0000 mg | Freq: Once | INTRAMUSCULAR | Status: AC
  Administered 2013-07-22: 1 mg via INTRAVENOUS
  Filled 2013-07-22: qty 1

## 2013-07-22 MED ORDER — ONDANSETRON HCL 4 MG/2ML IJ SOLN
4.0000 mg | Freq: Once | INTRAMUSCULAR | Status: AC
  Administered 2013-07-22: 4 mg via INTRAVENOUS
  Filled 2013-07-22: qty 2

## 2013-07-22 MED ORDER — METHOCARBAMOL 500 MG PO TABS: 500.0000 mg | ORAL_TABLET | Freq: Two times a day (BID) | ORAL | Status: DC

## 2013-07-22 MED ORDER — PREDNISONE 20 MG PO TABS: ORAL_TABLET | ORAL | Status: DC

## 2013-07-22 MED ORDER — KETOROLAC TROMETHAMINE 30 MG/ML IJ SOLN
30.0000 mg | Freq: Once | INTRAMUSCULAR | Status: AC
  Administered 2013-07-22: 30 mg via INTRAVENOUS
  Filled 2013-07-22: qty 1

## 2013-07-22 MED ORDER — OXYCODONE-ACETAMINOPHEN 5-325 MG PO TABS: 2.0000 | ORAL_TABLET | ORAL | Status: DC | PRN

## 2013-07-22 NOTE — ED Notes (Signed)
Dr. Blinda Leatherwood made aware pt is requesting medication for pain control. Pt currently rating pain 8/10 at this time.

## 2013-07-22 NOTE — ED Notes (Signed)
Pt returned from MRI at this time

## 2013-07-22 NOTE — ED Notes (Signed)
Pain medicine given- will wait approx 10-15 minutes before discharging patient to ensure pt does not have a reaction and feels okay to walk. Pt and family made aware of discharge plan.

## 2013-07-22 NOTE — ED Notes (Signed)
Pt currently in MRI

## 2013-07-22 NOTE — ED Provider Notes (Signed)
CSN: [id number]     Arrival date & time 07/22/13  1634 History   First MD Initiated Contact with Patient 07/22/13 1724     Chief Complaint  Patient presents with  . Headache  . Neck Pain   (Consider location/radiation/quality/duration/timing/severity/associated sxs/prior Treatment) HPI Comments: Patient presents to the ER for evaluation of headache and neck pain. Patient was seen by myself yesterday for several days of severe left-sided neck pain consistent with a left side cervical radiculopathy. Patient reports that around June today she had onset of a left-sided posterior headache radiating into the neck region. She is taking her pain medications but has not helped the headache at all. She reports the neck pain is now worse as well. The pain in the head and neck are both severe.  Patient is a 42 y.o. female presenting with headaches and neck pain.  Headache Associated symptoms: neck pain   Neck Pain Associated symptoms: headaches     Past Medical History  Diagnosis Date  . Anxiety   . Depression   . GERD (gastroesophageal reflux disease)   . Esophageal stricture     s/p dilatation x 2  . Hyperlipemia   . Hypertension   . History of alcohol abuse     no alcohol since 2004  . Migraine headache   . History of cocaine abuse     none since 2011, entered treatment  . Bipolar disorder   . Pancreatitis, acute 04/2011  . Obesity   . Nephrolithiasis     x 4   Past Surgical History  Procedure Laterality Date  . Appendectomy    . Cholecystectomy    . Abdominal hysterectomy    . Cesarean section    . Knee surgery  2010    leftx3  . Nasal septum surgery    . Carpal tunnel release  07/29/2012    Procedure: CARPAL TUNNEL RELEASE;  Surgeon: Eldred Manges, MD;  Location: [facility];  Service: Orthopedics;  Laterality: Right;  Right carpal tunnel release   Family History  Problem Relation Age of Onset  . Colon cancer Neg Hx   . Heart disease Maternal Grandmother   .  Kidney cancer Mother   . Hypertension Mother   . Cancer Mother     kidney  . Lung cancer Maternal Aunt   . Stroke Maternal Uncle 55  . Alcohol abuse Maternal Uncle     recovered  . Heart disease Maternal Uncle    History  Substance Use Topics  . Smoking status: Current Every Day Smoker -- 1.00 packs/day for 20 years    Types: Cigarettes  . Smokeless tobacco: Never Used  . Alcohol Use: No     Comment: none since 2004   OB History   Grav Para Term Preterm Abortions TAB SAB Ect Mult Living                 Review of Systems  HENT: Positive for neck pain.   Neurological: Positive for headaches.  All other systems reviewed and are negative.    Allergies  Imitrex; Effexor; Metoclopramide hcl; Morphine and related; Tegretol; Topamax; and Trazodone and nefazodone  Home Medications   Current Outpatient Rx  Name  Route  Sig  Dispense  Refill  . ALPRAZolam (XANAX XR) 0.5 MG 24 hr tablet   Oral   Take 1-1.5 mg by mouth at bedtime as needed (for anxiety/sleep).         . ALPRAZolam (XANAX) 1 MG tablet  Oral   Take 0.5-1 tablets (0.5-1 mg total) by mouth 2 (two) times daily as needed for anxiety. For breakthrough anxiety.   5 tablet   0   . amLODipine (NORVASC) 10 MG tablet   Oral   Take 1 tablet (10 mg total) by mouth daily.   90 tablet   1   . amphetamine-dextroamphetamine (ADDERALL) 30 MG tablet   Oral   Take 0.5 tablets (15 mg total) by mouth 3 (three) times daily.   45 tablet   0   . cloNIDine (CATAPRES) 0.1 MG tablet   Oral   Take 1 tablet (0.1 mg total) by mouth 3 (three) times daily.   90 tablet   1   . dicyclomine (BENTYL) 20 MG tablet   Oral   Take 1 tablet (20 mg total) by mouth every 6 (six) hours.   60 tablet   1   . EPINEPHrine (EPIPEN) 0.3 mg/0.3 mL DEVI   Intramuscular   Inject 0.3 mLs (0.3 mg total) into the muscle as needed (inject at time of signs of anaphylaxis).   2 Device   0   . hydrOXYzine (ATARAX/VISTARIL) 25 MG tablet    Oral   Take 25-100 mg by mouth at bedtime as needed (for sleep).         Marland Kitchen ibuprofen (ADVIL,MOTRIN) 200 MG tablet   Oral   Take 800 mg by mouth every 6 (six) hours as needed for pain.         . metaxalone (SKELAXIN) 800 MG tablet   Oral   Take 800 mg by mouth 3 (three) times daily as needed for pain.         Marland Kitchen ondansetron (ZOFRAN-ODT) 8 MG disintegrating tablet   Oral   Take 8 mg by mouth every 8 (eight) hours as needed for nausea.         Marland Kitchen oxyCODONE-acetaminophen (PERCOCET/ROXICET) 5-325 MG per tablet   Oral   Take 2 tablets by mouth every 6 (six) hours as needed for pain.         . pantoprazole (PROTONIX) 40 MG tablet   Oral   Take 40 mg by mouth at bedtime.          . predniSONE (DELTASONE) 20 MG tablet   Oral   Take 10-60 mg by mouth daily. 15 day taper started 07/22/13:  Take 3 tablets (60 mg) daily for 3 days, then take 2 tablets (40 mg) daily for 3 days, then take 1 1/2 tablets (30 mg) daily for 3 days, then take 1 tablet (20 mg) daily for 3 days, then take 1/2 tablet (10 mg) daily for 3 days, then stop         . sertraline (ZOLOFT) 100 MG tablet   Oral   Take 100 mg by mouth at bedtime.         . traMADol (ULTRAM) 50 MG tablet   Oral   Take 50 mg by mouth every 6 (six) hours as needed for pain.         . valACYclovir (VALTREX) 1000 MG tablet   Oral   Take 1,000 mg by mouth daily.          BP 134/83  Pulse 92  Temp(Src) 98.6 F (37 C) (Oral)  Resp 20  Ht 5\' 6"  (1.676 m)  Wt 180 lb (81.647 kg)  BMI 29.07 kg/m2  SpO2 99% Physical Exam  Constitutional: She is oriented to person, place, and time. She appears well-developed and  well-nourished. No distress.  HENT:  Head: Normocephalic and atraumatic.  Right Ear: Hearing normal.  Left Ear: Hearing normal.  Nose: Nose normal.  Mouth/Throat: Oropharynx is clear and moist and mucous membranes are normal.  Eyes: Conjunctivae and EOM are normal. Pupils are equal, round, and reactive to light.   Neck: Normal range of motion. Neck supple. Muscular tenderness present. No Brudzinski's sign and no Kernig's sign noted.  Cardiovascular: Regular rhythm, S1 normal and S2 normal.  Exam reveals no gallop and no friction rub.   No murmur heard. Pulmonary/Chest: Effort normal and breath sounds normal. No respiratory distress. She exhibits no tenderness.  Abdominal: Soft. Normal appearance and bowel sounds are normal. There is no hepatosplenomegaly. There is no tenderness. There is no rebound, no guarding, no tenderness at McBurney's point and negative Murphy's sign. No hernia.  Musculoskeletal: Normal range of motion.  Neurological: She is alert and oriented to person, place, and time. She has normal strength. No cranial nerve deficit or sensory deficit. Coordination normal. GCS eye subscore is 4. GCS verbal subscore is 5. GCS motor subscore is 6.  Skin: Skin is warm, dry and intact. No rash noted. No cyanosis.  Psychiatric: She has a normal mood and affect. Her speech is normal and behavior is normal. Thought content normal.    ED Course  Procedures (including critical care time) Labs Review Labs Reviewed - No data to display Imaging Review Ct Head Wo Contrast  07/22/2013   CLINICAL DATA:  Neck pain, headache  EXAM: CT HEAD WITHOUT CONTRAST  TECHNIQUE: Contiguous axial images were obtained from the base of the skull through the vertex without intravenous contrast.  COMPARISON:  08/04/2009  FINDINGS: No skull fracture is noted. Paranasal sinuses and mastoid air cells are unremarkable. No intracranial hemorrhage, mass effect or midline shift.  No acute cortical infarction. No mass lesion is noted on this unenhanced scan. The gray and white-matter differentiation is preserved. No intra or extra-axial fluid collection.  IMPRESSION: No acute intracranial abnormality.   Electronically Signed   By: Natasha Mead   On: 07/22/2013 18:01   Mr Cervical Spine Wo Contrast  07/22/2013   *RADIOLOGY REPORT*   Clinical Data: Left-sided neck pain extending down left arm. Started 2 days ago bending over.  MRI CERVICAL SPINE WITHOUT CONTRAST  Technique:  Multiplanar and multiecho pulse sequences of the cervical spine, to include the craniocervical junction and cervicothoracic junction, were obtained according to standard protocol without intravenous contrast.  Comparison: None.  Findings: Cervical medullary junction and visualized intracranial structures unremarkable.  No focal cervical cord signal abnormality.  Both vertebral arteries are patent.  Visualized paravertebral structures unremarkable.  C2-3:  Negative.  C3-4:  Minimal right foraminal narrowing.  C4-5:  Mild right foraminal narrowing.  C5-6:  Minimal bulge to the left.  No significant spinal stenosis.  C6-7:  Negative.  C7-T1:  Negative.  IMPRESSION: Minimal to mild degenerative changes as noted above without significant spinal stenosis.   Original Report Authenticated By: Lacy Duverney, M.D.    MDM  Diagnosis: 1. Neck pain 2. Migraine headache  Patient presents to the ER for evaluation of continued severe neck pain, now also complaining of headache. Patient does have a history of migraine headaches. I suspect that she is experiencing migraine headache as a sequela of the severe neck pain. Head CT was performed, however, to further evaluate. Head CT was negative. Patient's headache improved with treatment with Toradol, Compazine and Benadryl.  Patient had persistent left-sided neck, shoulder and  arm pain. MRI of the cervical spine was therefore performed, as bulging or herniated disc was felt a possibility. MRI, however, shows only very minimal degenerative changes, no pathology to explain patient's pain.  The patient indicated yesterday at the initial visit that she had sudden onset of pain when she bent over twisted. This is likely severe muscle spasm and muscular pain which will require continued treatment with rest and analgesia. She can continue the  Percocet and prednisone taper that she was prescribed yesterday.    Gilda Crease, MD 07/22/13 2010

## 2013-07-22 NOTE — ED Notes (Signed)
Patient transported to CT 

## 2013-07-22 NOTE — ED Notes (Signed)
Pt c/o neck pain and seen at [facility] for same yesterday and now c/o HA today with radiation to neck

## 2013-07-22 NOTE — ED Provider Notes (Signed)
CSN: [id number]     Arrival date & time 07/21/13  2024 History   First MD Initiated Contact with Patient 07/21/13 2151     Chief Complaint  Patient presents with  . Neck Pain  . Diarrhea   (Consider location/radiation/quality/duration/timing/severity/associated sxs/prior Treatment) HPI Comments: Presents to the ER for evaluation of left-sided neck pain radiating down the left arm. Patient reports that she bent over 2 days ago and felt sudden pain in the neck. Pain has been continuous since then. She feels worsening with turning her head side to side. There is no weakness in the arm. Denies chest pain, shortness of breath. She does report that she started having diarrhea last night. There has not been any abdominal pain, nausea or vomiting.  Patient is a 42 y.o. female presenting with neck pain and diarrhea.  Neck Pain Diarrhea   Past Medical History  Diagnosis Date  . Anxiety   . Depression   . GERD (gastroesophageal reflux disease)   . Esophageal stricture     s/p dilatation x 2  . Hyperlipemia   . Hypertension   . History of alcohol abuse     no alcohol since 2004  . Migraine headache   . History of cocaine abuse     none since 2011, entered treatment  . Bipolar disorder   . Pancreatitis, acute 04/2011  . Obesity   . Nephrolithiasis     x 4   Past Surgical History  Procedure Laterality Date  . Appendectomy    . Cholecystectomy    . Abdominal hysterectomy    . Cesarean section    . Knee surgery  2010    leftx3  . Nasal septum surgery    . Carpal tunnel release  07/29/2012    Procedure: CARPAL TUNNEL RELEASE;  Surgeon: Eldred Manges, MD;  Location: [facility];  Service: Orthopedics;  Laterality: Right;  Right carpal tunnel release   Family History  Problem Relation Age of Onset  . Colon cancer Neg Hx   . Heart disease Maternal Grandmother   . Kidney cancer Mother   . Hypertension Mother   . Cancer Mother     kidney  . Lung cancer Maternal Aunt   .  Stroke Maternal Uncle 55  . Alcohol abuse Maternal Uncle     recovered  . Heart disease Maternal Uncle    History  Substance Use Topics  . Smoking status: Current Every Day Smoker -- 1.00 packs/day for 20 years    Types: Cigarettes  . Smokeless tobacco: Never Used  . Alcohol Use: No     Comment: none since 2004   OB History   Grav Para Term Preterm Abortions TAB SAB Ect Mult Living                 Review of Systems  HENT: Positive for neck pain.   Gastrointestinal: Positive for diarrhea.  All other systems reviewed and are negative.    Allergies  Imitrex; Effexor; Metoclopramide hcl; Morphine and related; Tegretol; Topamax; and Trazodone and nefazodone  Home Medications   Current Outpatient Rx  Name  Route  Sig  Dispense  Refill  . ALPRAZolam (XANAX XR) 0.5 MG 24 hr tablet   Oral   Take 1-1.5 mg by mouth at bedtime as needed (for anxiety/sleep).         . ALPRAZolam (XANAX) 1 MG tablet   Oral   Take 0.5-1 tablets (0.5-1 mg total) by mouth 2 (two) times daily  as needed for anxiety. For breakthrough anxiety.   5 tablet   0   . amLODipine (NORVASC) 10 MG tablet   Oral   Take 1 tablet (10 mg total) by mouth daily.   90 tablet   1   . amphetamine-dextroamphetamine (ADDERALL) 30 MG tablet   Oral   Take 0.5 tablets (15 mg total) by mouth 3 (three) times daily.   45 tablet   0   . cloNIDine (CATAPRES) 0.1 MG tablet   Oral   Take 1 tablet (0.1 mg total) by mouth 3 (three) times daily.   90 tablet   1   . dicyclomine (BENTYL) 20 MG tablet   Oral   Take 1 tablet (20 mg total) by mouth every 6 (six) hours.   60 tablet   1   . hydrOXYzine (ATARAX/VISTARIL) 25 MG tablet   Oral   Take 25-100 mg by mouth at bedtime as needed (for sleep).         Marland Kitchen ibuprofen (ADVIL,MOTRIN) 200 MG tablet   Oral   Take 800 mg by mouth every 6 (six) hours as needed for pain.         Marland Kitchen lidocaine (LIDODERM) 5 %   Transdermal   Place 1 patch onto the skin daily as needed  (For pain). Remove & Discard patch within 12 hours or as directed by MD         . metaxalone (SKELAXIN) 800 MG tablet   Oral   Take 800 mg by mouth 3 (three) times daily as needed for pain.         Marland Kitchen ondansetron (ZOFRAN-ODT) 8 MG disintegrating tablet   Oral   Take 8 mg by mouth every 8 (eight) hours as needed for nausea.         Marland Kitchen oxyCODONE-acetaminophen (PERCOCET) 10-325 MG per tablet   Oral   Take 1 tablet by mouth every 4 (four) hours as needed for pain.   90 tablet   0     Increased frequency of use due to meniscal tear an ...   . pantoprazole (PROTONIX) 40 MG tablet   Oral   Take 40 mg by mouth daily.         . sertraline (ZOLOFT) 100 MG tablet   Oral   Take 1 tablet (100 mg total) by mouth daily.   90 tablet   0   . traMADol (ULTRAM) 50 MG tablet   Oral   Take 50 mg by mouth every 6 (six) hours as needed for pain.         . valACYclovir (VALTREX) 1000 MG tablet   Oral   Take 1,000 mg by mouth daily.         Marland Kitchen EPINEPHrine (EPIPEN) 0.3 mg/0.3 mL DEVI   Intramuscular   Inject 0.3 mLs (0.3 mg total) into the muscle as needed (inject at time of signs of anaphylaxis).   2 Device   0   . sucralfate (CARAFATE) 1 G tablet   Oral   Take 1 tablet (1 g total) by mouth 4 (four) times daily.   30 tablet   0    BP 107/79  Pulse 73  Temp(Src) 99.5 F (37.5 C) (Oral)  Resp 14  SpO2 100% Physical Exam  Constitutional: She is oriented to person, place, and time. She appears well-developed and well-nourished. No distress.  HENT:  Head: Normocephalic and atraumatic.  Right Ear: Hearing normal.  Left Ear: Hearing normal.  Nose: Nose normal.  Mouth/Throat: Oropharynx is clear and moist and mucous membranes are normal.  Eyes: Conjunctivae and EOM are normal. Pupils are equal, round, and reactive to light.  Neck: Neck supple. Muscular tenderness present.  Cardiovascular: Regular rhythm, S1 normal and S2 normal.  Exam reveals no gallop and no friction rub.    No murmur heard. Pulmonary/Chest: Effort normal and breath sounds normal. No respiratory distress. She exhibits no tenderness.  Abdominal: Soft. Normal appearance and bowel sounds are normal. There is no hepatosplenomegaly. There is no tenderness. There is no rebound, no guarding, no tenderness at McBurney's point and negative Murphy's sign. No hernia.  Musculoskeletal: Normal range of motion.  Neurological: She is alert and oriented to person, place, and time. She has normal strength. No cranial nerve deficit or sensory deficit. Coordination normal. GCS eye subscore is 4. GCS verbal subscore is 5. GCS motor subscore is 6.  Skin: Skin is warm, dry and intact. No rash noted. No cyanosis.  Psychiatric: She has a normal mood and affect. Her speech is normal and behavior is normal. Thought content normal.    ED Course  Procedures (including critical care time) Labs Review Labs Reviewed  CBC WITH DIFFERENTIAL - Abnormal; Notable for the following:    Neutrophils Relative % 42 (*)    Lymphocytes Relative 50 (*)    All other components within normal limits  COMPREHENSIVE METABOLIC PANEL - Abnormal; Notable for the following:    Potassium 3.3 (*)    All other components within normal limits   Imaging Review No results found.  MDM  Diagnosis: Cervical radiculopathy  Patient presents with sudden onset of pain in the left side of her neck radiating down her left arm. She has normal strength, sensation, reflexes in the upper extremities. Patient analgesia and corticosteroids. He R. Outpatient MRI versus followup was discussed with the patient. She does not have any family doctor to followup or will do the test. Patient will therefore be given a prednisone taper and analgesia, if not improving in several days to weeks time, to return to the ER during the daytime to be reevaluated and possible MRI if it is necessary. She was told to come back to the ER at any time she has had worsening of her  symptoms.    Gilda Crease, MD 07/22/13 [phone]

## 2013-07-23 ENCOUNTER — Encounter: Payer: Self-pay | Admitting: Physician Assistant

## 2013-07-23 ENCOUNTER — Other Ambulatory Visit: Payer: Self-pay | Admitting: Physician Assistant

## 2013-07-23 ENCOUNTER — Ambulatory Visit (INDEPENDENT_AMBULATORY_CARE_PROVIDER_SITE_OTHER): Payer: 59 | Admitting: Physician Assistant

## 2013-07-23 VITALS — BP 122/72 | HR 74 | Temp 98.7°F | Resp 16 | Ht 66.0 in | Wt 181.0 lb

## 2013-07-23 DIAGNOSIS — Z23 Encounter for immunization: Secondary | ICD-10-CM

## 2013-07-23 DIAGNOSIS — M542 Cervicalgia: Secondary | ICD-10-CM

## 2013-07-23 DIAGNOSIS — F419 Anxiety disorder, unspecified: Secondary | ICD-10-CM

## 2013-07-23 DIAGNOSIS — M545 Low back pain, unspecified: Secondary | ICD-10-CM

## 2013-07-23 DIAGNOSIS — M25561 Pain in right knee: Secondary | ICD-10-CM

## 2013-07-23 DIAGNOSIS — M25569 Pain in unspecified knee: Secondary | ICD-10-CM

## 2013-07-23 MED ORDER — OXYCODONE-ACETAMINOPHEN 10-325 MG PO TABS: 1.0000 | ORAL_TABLET | ORAL | Status: DC | PRN

## 2013-07-23 MED ORDER — ALPRAZOLAM ER 0.5 MG PO TB24: 1.0000 mg | ORAL_TABLET | Freq: Every evening | ORAL | Status: DC | PRN

## 2013-07-23 MED ORDER — OXYCODONE-ACETAMINOPHEN 10-325 MG PO TABS: 1.0000 | ORAL_TABLET | Freq: Four times a day (QID) | ORAL | Status: DC | PRN

## 2013-07-23 MED ORDER — ALPRAZOLAM 1 MG PO TABS: 0.5000 mg | ORAL_TABLET | Freq: Two times a day (BID) | ORAL | Status: DC | PRN

## 2013-07-24 ENCOUNTER — Encounter: Payer: Self-pay | Admitting: Physician Assistant

## 2013-07-24 NOTE — Progress Notes (Signed)
  Subjective:    Patient ID: [name], female    DOB: 1971-06-02, 42 y.o.   MRN: [id number]  HPI  This 42 y.o. female presents for evaluation of chronic pain in the back and knee, due for a refill of oxycodone. She received a 15-day supply from me on 07/08/2013 at her most recent visit for this issue.  She is to have arthroscopy of the knee for suspected meniscal tear.  She was seen with abdominal pain here on 9/11, thought to be due to esophageal stricture, though due to her history of pancreatitis, amylase and lipase were checked.  She was too concerned to wait for the results, so presented to the ED after leaving here.  Lipase was repeated.  Labs were negative, and the pain has since resolved.  As such, she hasn't scheduled a follow-up with GI.  Then on 07/21/2013 she presented to the ED again with LEFT sided neck pain radiating down the LEFT arm. It was thought due to muscular strain/spasm, but with radicular symptoms, there was concern for disc injury. The neck pain triggered a migraine headache and she returned to the ED 07/22/2013 and MRI of the neck revealed mild degenerative changes, with mild bulging to the LEFT at C5-6, without stenosis.  The migraine was treated with toradol, compazine and benadryl. She was given supplemental oxycodone (2-day supply), prednisone taper, and has been using Robaxin that she has at home.  The neck and arm pain are improved, though not yet resolved.  No weakness.  ROM reduced by pain.  Bowel and bladder control are unchanged.  No weakness in the legs, no LE paresthesias. No HA, dizziness.  No GI symptoms at present.  Review of Systems As above.    Objective:   Physical Exam  Blood pressure 122/72, pulse 74, temperature 98.7 F (37.1 C), temperature source Oral, resp. rate 16, height 5\' 6"  (1.676 m), weight 181 lb (82.101 kg), SpO2 100.00%. Body mass index is 29.23 kg/(m^2). Well-developed, well nourished WF who is awake, alert and oriented, in NAD.  Accompanied by her partner, [name], who is an ED nurse. HEENT: [place]/AT, sclera and conjunctiva are clear.   Heart: Regular rate Lungs: normal effort Neck: No bony tenderness.  Tenderness along the LEFT paraspinous muscles and into the trapezius, down along the scapula. Extremities: no cyanosis, clubbing or edema. Normal sensation, FROM, good strength. Skin: warm and dry without rash. Psychologic: good mood and appropriate affect, normal speech and behavior.       Assessment & Plan:  Knee pain, right/Lumbar back pain - Plan: oxyCODONE-acetaminophen (PERCOCET) 10-325 MG per tablet. 15-day supply to fill 07/24/2013.  May call for next fill in 15 days, and follow-up with me 15 days after that.  Certainly, she may come in sooner as needed for any reason.  Neck pain on left side - Plan: continue prednisone and robaxin.  Continue her usual percocet schedule with supplemental tramadol as needed.  Massage when she's able.  Need for influenza vaccination - Plan: Flu Vaccine QUAD 36+ mos IM  Encouraged to schedule follow-up with Dr. Arlyce Dice, as we can expect esophageal spasm recurrence, and may need repeat upper endoscopy and dilation. [name], PA-C Physician Assistant-Certified [facility]

## 2013-07-25 ENCOUNTER — Encounter (HOSPITAL_BASED_OUTPATIENT_CLINIC_OR_DEPARTMENT_OTHER): Payer: Self-pay | Admitting: *Deleted

## 2013-07-25 NOTE — Pre-Procedure Instructions (Signed)
To come for EKG 

## 2013-07-25 NOTE — Pre-Procedure Instructions (Signed)
Dr. Ivin Booty notified of K+ result of 3.3 on 07/21/2013; is OK, does not need to be repeated DOS.

## 2013-07-28 ENCOUNTER — Other Ambulatory Visit: Payer: Self-pay | Admitting: Orthopedic Surgery

## 2013-07-30 ENCOUNTER — Encounter (HOSPITAL_BASED_OUTPATIENT_CLINIC_OR_DEPARTMENT_OTHER)
Admission: RE | Admit: 2013-07-30 | Discharge: 2013-07-30 | Disposition: A | Discharge status: Home / Self Care | Payer: 59 | Source: Ambulatory Visit | Attending: Orthopedic Surgery | Admitting: Orthopedic Surgery

## 2013-07-30 NOTE — H&P (Signed)
  [name]/[facility] [address]   [address] [place], [place] [address] [phone] A Division of [facility]  Loreta Ave, M.D.   Robert A. Thurston Hole, M.D.   Burnell Blanks, M.D.   Eulas Post, M.D.   Lunette Stands, M.D Jewel Baize. Eulah Pont, M.D.  Buford Dresser, M.D.  Charlsie Quest, M.D.  Estell Harpin, M.D.   Melina Fiddler, M.D. Danford Bad. Willa Rough, PA-C  Kirstin A. Shepperson, PA-C  Josh Ixonia, PA-C [place], [place]   RE: Sh[place], [place]   [id number]      DOB: 1971-09-06 INITIAL EVALUATION: 07-04-13 Airika is a new patient to the office.  She presents for evaluation of right knee pain.  Acute onset of pain occurring as she was awkwardly stepping over a dog gate.  Present for the last 3-4 weeks.  Getting worse rather than better.  She cannot twist or bend because of marked symptoms.  She feels like her knee locks up.  That sounds more medial that retropatellar.  Nonweightbearing films Urgent Care viewed showing no acute bony abnormalities but again these were nonweightbearing.   Previous history updated, reviewed and included in the chart.  Of note, she has had at least 3 arthroscopies on her left knee including meniscal debridement, chondroplasty and microfracture.  She still has symptoms there but not nearly as acute as on the right.  She is also status post right carpal tunnel release by Dr. Annell Greening for confirmation of severe right carpal tunnel syndrome.  She still needs to have the left one released where she has marked symptoms there.  She has not proceeded with that as of yet.    EXAMINATION: General exam is outlined and included in the chart.  Specifically 42 years old, 5'6", 170 pounds.  antalgic gait on the  right.  On upper extremity exam, well healed incision on the right.  Improved, normal sensation there.  On the left, she has a positive Tinel's and Phalen's.  Not much in the way of thenar atrophy fortunately.  Lower extremity exam shows negative log roll both hips, negative straight leg raise both sides.  On the left, I can get her through fairly good motion.  Her ligaments are stable.  A little bit of patellofemoral crepitus but nothing marked.  On the right, exquisitely tender medial joint line, positive medial McMurray's.  1-2+ patellofemoral crepitus.  Increased Q-angle on both sides but not extreme.  Ligaments are stable on the right.  Neurovascularly intact distally.    X-RAYS: 4-view standing x-rays both sides really shows that she has relatively well preserved joint spaces throughout despite all the intervention on the left in the past.    IMPRESSION: Medial meniscus tear right knee.    DISPOSITION: I have discussed definitive workup MRI which she does not want and adamantly refused to get.  We have discussed exam under anesthesia, arthroscopy, assessment of her knee and care for any injury that is present.  Procedure, risks, benefits, complications reviewed in detail.  Benefits of a preop MRI emphasized to her but she would like to proceed which I do not feel is completely unreasonable.  Paperwork completed.  All questions answered.  How long she is going to be out of work will be dependent upon what we find.   Loreta Ave, M.D. Electronically verified by Loreta Ave, M.D. DFM:gde D 07-04-13 T 07-08-13

## 2013-07-31 ENCOUNTER — Encounter (HOSPITAL_BASED_OUTPATIENT_CLINIC_OR_DEPARTMENT_OTHER): Payer: Self-pay | Admitting: Anesthesiology

## 2013-07-31 ENCOUNTER — Encounter: Payer: Self-pay | Admitting: Physician Assistant

## 2013-07-31 ENCOUNTER — Other Ambulatory Visit: Payer: Self-pay | Admitting: Physician Assistant

## 2013-07-31 ENCOUNTER — Ambulatory Visit (HOSPITAL_BASED_OUTPATIENT_CLINIC_OR_DEPARTMENT_OTHER)
Admission: RE | Admit: 2013-07-31 | Discharge: 2013-07-31 | Disposition: A | Discharge status: Home / Self Care | Payer: 59 | Source: Ambulatory Visit | Attending: Orthopedic Surgery | Admitting: Orthopedic Surgery

## 2013-07-31 ENCOUNTER — Encounter (HOSPITAL_BASED_OUTPATIENT_CLINIC_OR_DEPARTMENT_OTHER)
Admission: RE | Disposition: A | Discharge status: Home / Self Care | Payer: Self-pay | Source: Ambulatory Visit | Attending: Orthopedic Surgery

## 2013-07-31 ENCOUNTER — Ambulatory Visit (HOSPITAL_BASED_OUTPATIENT_CLINIC_OR_DEPARTMENT_OTHER): Payer: 59 | Admitting: Anesthesiology

## 2013-07-31 DIAGNOSIS — Z01810 Encounter for preprocedural cardiovascular examination: Secondary | ICD-10-CM | POA: Insufficient documentation

## 2013-07-31 DIAGNOSIS — M25569 Pain in unspecified knee: Secondary | ICD-10-CM | POA: Insufficient documentation

## 2013-07-31 DIAGNOSIS — Z01812 Encounter for preprocedural laboratory examination: Secondary | ICD-10-CM | POA: Insufficient documentation

## 2013-07-31 DIAGNOSIS — M25561 Pain in right knee: Secondary | ICD-10-CM

## 2013-07-31 DIAGNOSIS — Z01818 Encounter for other preprocedural examination: Secondary | ICD-10-CM | POA: Insufficient documentation

## 2013-07-31 DIAGNOSIS — M2340 Loose body in knee, unspecified knee: Secondary | ICD-10-CM | POA: Insufficient documentation

## 2013-07-31 DIAGNOSIS — M23305 Other meniscus derangements, unspecified medial meniscus, unspecified knee: Secondary | ICD-10-CM | POA: Insufficient documentation

## 2013-07-31 HISTORY — DX: Sleep apnea, unspecified: G47.30

## 2013-07-31 HISTORY — DX: Dental restoration status: Z98.811

## 2013-07-31 HISTORY — DX: Herpesviral infection of urogenital system, unspecified: A60.00

## 2013-07-31 HISTORY — DX: Adverse effect of unspecified anesthetic, initial encounter: T41.45XA

## 2013-07-31 HISTORY — DX: Other tear of medial meniscus, current injury, unspecified knee, initial encounter: S83.249A

## 2013-07-31 HISTORY — DX: Personal history of urinary calculi: Z87.442

## 2013-07-31 HISTORY — DX: Personal history of other diseases of the digestive system: Z87.19

## 2013-07-31 HISTORY — DX: Unspecified osteoarthritis, unspecified site: M19.90

## 2013-07-31 HISTORY — PX: KNEE ARTHROSCOPY WITH MEDIAL MENISECTOMY: SHX5651

## 2013-07-31 HISTORY — DX: Other specified postprocedural states: Z98.890

## 2013-07-31 HISTORY — DX: Strain of muscle, fascia and tendon at neck level, initial encounter: S16.1XXA

## 2013-07-31 LAB — POCT HEMOGLOBIN-HEMACUE: Hemoglobin: 13.7 g/dL (ref 12.0–15.0)

## 2013-07-31 SURGERY — ARTHROSCOPY, KNEE, WITH MEDIAL MENISCECTOMY
Anesthesia: General | Site: Knee | Laterality: Right | Wound class: Clean

## 2013-07-31 MED ORDER — MIDAZOLAM HCL 2 MG/2ML IJ SOLN: 1.0000 mg | INTRAMUSCULAR | Status: DC | PRN

## 2013-07-31 MED ORDER — CEFAZOLIN SODIUM-DEXTROSE 2-3 GM-% IV SOLR: 2.0000 g | INTRAVENOUS | Status: DC

## 2013-07-31 MED ORDER — CHLORHEXIDINE GLUCONATE 4 % EX LIQD: 60.0000 mL | Freq: Once | CUTANEOUS | Status: DC

## 2013-07-31 MED ORDER — LACTATED RINGERS IV SOLN
INTRAVENOUS | Status: DC
  Administered 2013-07-31 (×2): via INTRAVENOUS

## 2013-07-31 MED ORDER — BUPIVACAINE HCL (PF) 0.5 % IJ SOLN
INTRAMUSCULAR | Status: DC | PRN
  Administered 2013-07-31: 20 mL

## 2013-07-31 MED ORDER — PROPOFOL 10 MG/ML IV BOLUS
INTRAVENOUS | Status: DC | PRN
  Administered 2013-07-31: 200 mg via INTRAVENOUS

## 2013-07-31 MED ORDER — HYDROMORPHONE HCL PF 1 MG/ML IJ SOLN
0.2500 mg | INTRAMUSCULAR | Status: DC | PRN
  Administered 2013-07-31 (×4): 0.5 mg via INTRAVENOUS

## 2013-07-31 MED ORDER — FENTANYL CITRATE 0.05 MG/ML IJ SOLN
INTRAMUSCULAR | Status: DC | PRN
  Administered 2013-07-31 (×2): 50 ug via INTRAVENOUS

## 2013-07-31 MED ORDER — DEXAMETHASONE SODIUM PHOSPHATE 4 MG/ML IJ SOLN
INTRAMUSCULAR | Status: DC | PRN
  Administered 2013-07-31: 10 mg via INTRAVENOUS

## 2013-07-31 MED ORDER — FENTANYL CITRATE 0.05 MG/ML IJ SOLN: 50.0000 ug | INTRAMUSCULAR | Status: DC | PRN

## 2013-07-31 MED ORDER — DEXTROSE-NACL 5-0.45 % IV SOLN: INTRAVENOUS | Status: DC

## 2013-07-31 MED ORDER — ONDANSETRON HCL 4 MG/2ML IJ SOLN
INTRAMUSCULAR | Status: DC | PRN
  Administered 2013-07-31: 4 mg via INTRAVENOUS

## 2013-07-31 MED ORDER — PROMETHAZINE HCL 25 MG/ML IJ SOLN: 6.2500 mg | INTRAMUSCULAR | Status: DC | PRN

## 2013-07-31 MED ORDER — ACETAMINOPHEN 500 MG PO TABS: 1000.0000 mg | ORAL_TABLET | Freq: Once | ORAL | Status: DC

## 2013-07-31 MED ORDER — MIDAZOLAM HCL 5 MG/5ML IJ SOLN
INTRAMUSCULAR | Status: DC | PRN
  Administered 2013-07-31: 2 mg via INTRAVENOUS

## 2013-07-31 MED ORDER — LIDOCAINE HCL (CARDIAC) 20 MG/ML IV SOLN
INTRAVENOUS | Status: DC | PRN
  Administered 2013-07-31: 60 mg via INTRAVENOUS

## 2013-07-31 MED ORDER — CEFAZOLIN SODIUM-DEXTROSE 2-3 GM-% IV SOLR
2.0000 g | INTRAVENOUS | Status: AC
  Administered 2013-07-31: 2 g via INTRAVENOUS

## 2013-07-31 MED ORDER — MIDAZOLAM HCL 2 MG/ML PO SYRP: 12.0000 mg | ORAL_SOLUTION | Freq: Once | ORAL | Status: DC | PRN

## 2013-07-31 MED ORDER — OXYCODONE HCL 5 MG/5ML PO SOLN: 5.0000 mg | Freq: Once | ORAL | Status: AC | PRN

## 2013-07-31 MED ORDER — METHYLPREDNISOLONE ACETATE 80 MG/ML IJ SUSP
INTRAMUSCULAR | Status: DC | PRN
  Administered 2013-07-31: 80 mg

## 2013-07-31 MED ORDER — KETOROLAC TROMETHAMINE 30 MG/ML IJ SOLN
INTRAMUSCULAR | Status: DC | PRN
  Administered 2013-07-31: 30 mg via INTRAVENOUS

## 2013-07-31 MED ORDER — OXYCODONE HCL 5 MG PO TABS
5.0000 mg | ORAL_TABLET | Freq: Once | ORAL | Status: AC | PRN
  Administered 2013-07-31: 5 mg via ORAL

## 2013-07-31 MED ORDER — SODIUM CHLORIDE 0.9 % IR SOLN
Status: DC | PRN
  Administered 2013-07-31: 6000 mL

## 2013-07-31 SURGICAL SUPPLY — 42 items
BANDAGE ELASTIC 6 VELCRO ST LF (GAUZE/BANDAGES/DRESSINGS) ×2 IMPLANT
BLADE CUDA 5.5 (BLADE) IMPLANT
BLADE CUDA GRT WHITE 3.5 (BLADE) IMPLANT
BLADE CUTTER GATOR 3.5 (BLADE) ×2 IMPLANT
BLADE CUTTER MENIS 5.5 (BLADE) IMPLANT
BLADE GREAT WHITE 4.2 (BLADE) ×2 IMPLANT
BUR OVAL 4.0 (BURR) IMPLANT
CANISTER OMNI JUG 16 LITER (MISCELLANEOUS) ×2 IMPLANT
CANISTER SUCTION 2500CC (MISCELLANEOUS) IMPLANT
CLOTH BEACON ORANGE TIMEOUT ST (SAFETY) ×2 IMPLANT
CUTTER MENISCUS  4.2MM (BLADE)
CUTTER MENISCUS 4.2MM (BLADE) IMPLANT
DRAPE ARTHROSCOPY W/POUCH 90 (DRAPES) ×2 IMPLANT
DURAPREP 26ML APPLICATOR (WOUND CARE) ×3 IMPLANT
ELECT MENISCUS 165MM 90D (ELECTRODE) IMPLANT
ELECT REM PT RETURN 9FT ADLT (ELECTROSURGICAL)
ELECTRODE REM PT RTRN 9FT ADLT (ELECTROSURGICAL) IMPLANT
GAUZE XEROFORM 1X8 LF (GAUZE/BANDAGES/DRESSINGS) ×2 IMPLANT
GLOVE BIO SURGEON STRL SZ 6.5 (GLOVE) ×1 IMPLANT
GLOVE BIO SURGEON STRL SZ8 (GLOVE) ×2 IMPLANT
GLOVE BIOGEL PI IND STRL 7.0 (GLOVE) IMPLANT
GLOVE BIOGEL PI IND STRL 8.5 (GLOVE) ×1 IMPLANT
GLOVE BIOGEL PI INDICATOR 7.0 (GLOVE) ×1
GLOVE BIOGEL PI INDICATOR 8.5 (GLOVE) ×1
GLOVE EXAM NITRILE LRG STRL (GLOVE) ×1 IMPLANT
GLOVE ORTHO TXT STRL SZ7.5 (GLOVE) ×2 IMPLANT
GOWN PREVENTION PLUS XLARGE (GOWN DISPOSABLE) ×2 IMPLANT
GOWN PREVENTION PLUS XXLARGE (GOWN DISPOSABLE) ×1 IMPLANT
GOWN STRL REIN 2XL XLG LVL4 (GOWN DISPOSABLE) ×1 IMPLANT
HOLDER KNEE FOAM BLUE (MISCELLANEOUS) ×2 IMPLANT
IV NS IRRIG 3000ML ARTHROMATIC (IV SOLUTION) ×4 IMPLANT
KNEE WRAP E Z 3 GEL PACK (MISCELLANEOUS) ×2 IMPLANT
PACK ARTHROSCOPY DSU (CUSTOM PROCEDURE TRAY) ×2 IMPLANT
PACK BASIN DAY SURGERY FS (CUSTOM PROCEDURE TRAY) ×2 IMPLANT
PENCIL BUTTON HOLSTER BLD 10FT (ELECTRODE) IMPLANT
SET ARTHROSCOPY TUBING (MISCELLANEOUS) ×2
SET ARTHROSCOPY TUBING LN (MISCELLANEOUS) ×1 IMPLANT
SPONGE GAUZE 4X4 12PLY (GAUZE/BANDAGES/DRESSINGS) ×4 IMPLANT
SUT ETHILON 3 0 PS 1 (SUTURE) ×2 IMPLANT
SUT VIC AB 3-0 FS2 27 (SUTURE) IMPLANT
TOWEL OR 17X24 6PK STRL BLUE (TOWEL DISPOSABLE) ×2 IMPLANT
WATER STERILE IRR 1000ML POUR (IV SOLUTION) ×2 IMPLANT

## 2013-07-31 NOTE — Transfer of Care (Signed)
Immediate Anesthesia Transfer of Care Note  Patient: [name]  Procedure(s) Performed: Procedure(s): KNEE ARTHROSCOPY WITH PARTIAL LATERAL MENISECTOMY, CHONDROPLASTY (Right)  Patient Location: PACU  Anesthesia Type:General  Level of Consciousness: awake and sedated  Airway & Oxygen Therapy: Patient Spontanous Breathing and Patient connected to face mask oxygen  Post-op Assessment: Report given to PACU RN and Post -op Vital signs reviewed and stable  Post vital signs: Reviewed and stable  Complications: No apparent anesthesia complications

## 2013-07-31 NOTE — Anesthesia Postprocedure Evaluation (Signed)
  Anesthesia Post-op Note  Patient: [name]  Procedure(s) Performed: Procedure(s): KNEE ARTHROSCOPY WITH PARTIAL LATERAL MENISECTOMY, CHONDROPLASTY (Right)  Patient Location: PACU  Anesthesia Type:General  Level of Consciousness: awake and alert   Airway and Oxygen Therapy: Patient Spontanous Breathing  Post-op Pain: mild  Post-op Assessment: Post-op Vital signs reviewed  Post-op Vital Signs: stable  Complications: No apparent anesthesia complications

## 2013-07-31 NOTE — Interval H&P Note (Signed)
History and Physical Interval Note:  07/31/2013 7:28 AM  [name]  has presented today for surgery, with the diagnosis of RIGHT KNEE MEDIAL MENISCAL TEAR  The various methods of treatment have been discussed with the patient and family. After consideration of risks, benefits and other options for treatment, the patient has consented to  Procedure(s): KNEE ARTHROSCOPY WITH MEDIAL MENISECTOMY (Right) as a surgical intervention .  The patient's history has been reviewed, patient examined, no change in status, stable for surgery.  I have reviewed the patient's chart and labs.  Questions were answered to the patient's satisfaction.     MURPHY,DANIEL F

## 2013-07-31 NOTE — Anesthesia Procedure Notes (Signed)
Procedure Name: LMA Insertion Performed by: Caran Storck W Pre-anesthesia Checklist: Patient identified, Timeout performed, Emergency Drugs available, Suction available and Patient being monitored Patient Re-evaluated:Patient Re-evaluated prior to inductionOxygen Delivery Method: Circle system utilized Preoxygenation: Pre-oxygenation with 100% oxygen Intubation Type: IV induction Ventilation: Mask ventilation without difficulty LMA: LMA inserted LMA Size: 4.0 Number of attempts: 1 Placement Confirmation: breath sounds checked- equal and bilateral and positive ETCO2 Tube secured with: Tape Dental Injury: Teeth and Oropharynx as per pre-operative assessment      

## 2013-07-31 NOTE — Anesthesia Preprocedure Evaluation (Signed)
Anesthesia Evaluation  Patient identified by MRN, date of birth, ID band Patient awake    Reviewed: Allergy & Precautions, H&P , NPO status , Patient's Chart, lab work & pertinent test results  Airway Mallampati: I      Dental  (+) Chipped,    Pulmonary sleep apnea ,  breath sounds clear to auscultation        Cardiovascular hypertension, Rhythm:Regular Rate:Normal     Neuro/Psych    GI/Hepatic Neg liver ROS, GERD-  Controlled,  Endo/Other    Renal/GU negative Renal ROS     Musculoskeletal negative musculoskeletal ROS (+)   Abdominal   Peds  Hematology   Anesthesia Other Findings   Reproductive/Obstetrics                           Anesthesia Physical Anesthesia Plan  ASA: II  Anesthesia Plan: General   Post-op Pain Management:    Induction: Intravenous  Airway Management Planned: LMA  Additional Equipment:   Intra-op Plan:   Post-operative Plan: Extubation in OR  Informed Consent: I have reviewed the patients History and Physical, chart, labs and discussed the procedure including the risks, benefits and alternatives for the proposed anesthesia with the patient or authorized representative who has indicated his/her understanding and acceptance.   Dental advisory given  Plan Discussed with: CRNA and Surgeon  Anesthesia Plan Comments:         Anesthesia Quick Evaluation

## 2013-08-01 ENCOUNTER — Encounter (HOSPITAL_BASED_OUTPATIENT_CLINIC_OR_DEPARTMENT_OTHER): Payer: Self-pay | Admitting: Orthopedic Surgery

## 2013-08-01 NOTE — Op Note (Signed)
[name], [name]                   ACCOUNT NO.:  [id number]  MEDICAL RECORD NO.:  [id number]  LOCATION:                                FACILITY:  [facility]  PHYSICIAN:  Loreta Ave, M.D. DATE OF BIRTH:  02-19-1971  DATE OF PROCEDURE:  07/31/2013 DATE OF DISCHARGE:  07/31/2013                              OPERATIVE REPORT   PREOPERATIVE DIAGNOSIS:  Right knee medial meniscus tear.  POSTOPERATIVE DIAGNOSIS:  Right knee flap tear of posterior horn lateral meniscus with acute chondral injury of trochlea, as well as lateral tibial plateau with chondral loose bodies.  PROCEDURE:  Right knee exam under anesthesia, arthroscopy.  Debris of the lateral meniscus.  Chondroplasty of the lateral tibial plateau, as well as patellofemoral joint, especially on the trochlea.  Removal of chondral loose bodies.  SURGEON:  Loreta Ave, M.D.  ASSISTANT:  Domingo Cocking, PA-C  ANESTHESIA:  General.  BLOOD LOSS:  Minimal.  SPECIMENS:  None.  CULTURES:  None.  COMPLICATION:  None.  DRESSINGS:  Soft compressive.  TOURNIQUET:  Not employed.  PROCEDURE:  PROCEDURE IN DETAIL:  The patient was brought to the operating room and placed on the operating table in supine position.  After adequate anesthesia had been obtained, leg holder applied.  Leg prepped and draped in usual sterile fashion.  Two portals, one each medial and lateral parapatellar.  Arthroscope was introduced.  Knee distended and inspected.  Patella tracking not that.  Acute chondral injury and partial thickness of the middle of the trochlea.  Chondroplasty to a stable surface.  A  little change on patella, but nothing marked.  No evidence of maltracking or chronic chondromalacia.  Some chondral debris cleared throughout.  Synovitis anteromedial, but no real large plica. Cruciate ligament was intact.  Medial meniscus and medial compartment looked great.  Lateral meniscus flap tear off the posterior horn.  Also some linear  chondral fracturing of the plateau.  Chondroplasty to a stable surface. Nothing full-thickness.  Entire knee examined.  No other findings were appreciated.  Instruments and fluid removed.  Intra-articular injection and Depo-Medrol Marcaine.  Portals were closed with nylon.  Sterile compressive dressing applied.  Anesthesia reversed.  Brought to the recovery room.  Tolerated the surgery well with no complications.     Loreta Ave, M.D.     DFM/MEDQ  D:  07/31/2013  T:  08/01/2013  Job:  [id number]

## 2013-08-03 ENCOUNTER — Emergency Department (HOSPITAL_COMMUNITY)
Admission: EM | Admit: 2013-08-03 | Discharge: 2013-08-04 | Disposition: A | Discharge status: Home / Self Care | Payer: 59 | Attending: Emergency Medicine | Admitting: Emergency Medicine

## 2013-08-03 ENCOUNTER — Emergency Department (HOSPITAL_COMMUNITY)
Admission: EM | Admit: 2013-08-03 | Discharge: 2013-08-03 | Disposition: A | Discharge status: Home / Self Care | Payer: 59

## 2013-08-03 ENCOUNTER — Encounter (HOSPITAL_COMMUNITY): Payer: Self-pay | Admitting: Emergency Medicine

## 2013-08-03 DIAGNOSIS — Z96659 Presence of unspecified artificial knee joint: Secondary | ICD-10-CM | POA: Insufficient documentation

## 2013-08-03 DIAGNOSIS — Z87442 Personal history of urinary calculi: Secondary | ICD-10-CM | POA: Insufficient documentation

## 2013-08-03 DIAGNOSIS — F411 Generalized anxiety disorder: Secondary | ICD-10-CM | POA: Insufficient documentation

## 2013-08-03 DIAGNOSIS — G4730 Sleep apnea, unspecified: Secondary | ICD-10-CM | POA: Insufficient documentation

## 2013-08-03 DIAGNOSIS — R110 Nausea: Secondary | ICD-10-CM | POA: Insufficient documentation

## 2013-08-03 DIAGNOSIS — M25469 Effusion, unspecified knee: Secondary | ICD-10-CM | POA: Insufficient documentation

## 2013-08-03 DIAGNOSIS — Z79899 Other long term (current) drug therapy: Secondary | ICD-10-CM | POA: Insufficient documentation

## 2013-08-03 DIAGNOSIS — R259 Unspecified abnormal involuntary movements: Secondary | ICD-10-CM | POA: Insufficient documentation

## 2013-08-03 DIAGNOSIS — Z87828 Personal history of other (healed) physical injury and trauma: Secondary | ICD-10-CM | POA: Insufficient documentation

## 2013-08-03 DIAGNOSIS — M129 Arthropathy, unspecified: Secondary | ICD-10-CM | POA: Insufficient documentation

## 2013-08-03 DIAGNOSIS — I10 Essential (primary) hypertension: Secondary | ICD-10-CM | POA: Insufficient documentation

## 2013-08-03 DIAGNOSIS — R1013 Epigastric pain: Secondary | ICD-10-CM | POA: Insufficient documentation

## 2013-08-03 DIAGNOSIS — F3289 Other specified depressive episodes: Secondary | ICD-10-CM | POA: Insufficient documentation

## 2013-08-03 DIAGNOSIS — Z98811 Dental restoration status: Secondary | ICD-10-CM | POA: Insufficient documentation

## 2013-08-03 DIAGNOSIS — Z8619 Personal history of other infectious and parasitic diseases: Secondary | ICD-10-CM | POA: Insufficient documentation

## 2013-08-03 DIAGNOSIS — F329 Major depressive disorder, single episode, unspecified: Secondary | ICD-10-CM | POA: Insufficient documentation

## 2013-08-03 DIAGNOSIS — Z90710 Acquired absence of both cervix and uterus: Secondary | ICD-10-CM | POA: Insufficient documentation

## 2013-08-03 DIAGNOSIS — K219 Gastro-esophageal reflux disease without esophagitis: Secondary | ICD-10-CM | POA: Insufficient documentation

## 2013-08-03 DIAGNOSIS — Z87891 Personal history of nicotine dependence: Secondary | ICD-10-CM | POA: Insufficient documentation

## 2013-08-03 DIAGNOSIS — G43909 Migraine, unspecified, not intractable, without status migrainosus: Secondary | ICD-10-CM | POA: Insufficient documentation

## 2013-08-03 DIAGNOSIS — Z9889 Other specified postprocedural states: Secondary | ICD-10-CM | POA: Insufficient documentation

## 2013-08-03 LAB — CBC WITH DIFFERENTIAL/PLATELET
Basophils Absolute: 0.1 10*3/uL (ref 0.0–0.1)
Basophils Relative: 0 % (ref 0–1)
Eosinophils Absolute: 0 10*3/uL (ref 0.0–0.7)
Eosinophils Relative: 0 % (ref 0–5)
HCT: 39.7 % (ref 36.0–46.0)
Hemoglobin: 13.7 g/dL (ref 12.0–15.0)
MCH: 30 pg (ref 26.0–34.0)
MCHC: 34.5 g/dL (ref 30.0–36.0)
Monocytes Absolute: 0.9 10*3/uL (ref 0.1–1.0)
Monocytes Relative: 6 % (ref 3–12)
Neutrophils Relative %: 56 % (ref 43–77)
RDW: 14.4 % (ref 11.5–15.5)

## 2013-08-03 LAB — COMPREHENSIVE METABOLIC PANEL
AST: 28 U/L (ref 0–37)
Albumin: 3.9 g/dL (ref 3.5–5.2)
BUN: 8 mg/dL (ref 6–23)
Calcium: 9.9 mg/dL (ref 8.4–10.5)
Creatinine, Ser: 0.6 mg/dL (ref 0.50–1.10)
Total Protein: 7.7 g/dL (ref 6.0–8.3)

## 2013-08-03 LAB — URINALYSIS, ROUTINE W REFLEX MICROSCOPIC
Bilirubin Urine: NEGATIVE
Glucose, UA: NEGATIVE mg/dL
Ketones, ur: NEGATIVE mg/dL
Leukocytes, UA: NEGATIVE
Nitrite: NEGATIVE
Specific Gravity, Urine: 1.014 (ref 1.005–1.030)
pH: 6.5 (ref 5.0–8.0)

## 2013-08-03 LAB — POCT I-STAT TROPONIN I: Troponin i, poc: 0 ng/mL (ref 0.00–0.08)

## 2013-08-03 LAB — LIPASE, BLOOD: Lipase: 30 U/L (ref 11–59)

## 2013-08-03 MED ORDER — PROCHLORPERAZINE EDISYLATE 5 MG/ML IJ SOLN
10.0000 mg | Freq: Once | INTRAMUSCULAR | Status: AC
  Administered 2013-08-03: 10 mg via INTRAVENOUS
  Filled 2013-08-03: qty 2

## 2013-08-03 MED ORDER — HYDROMORPHONE HCL PF 1 MG/ML IJ SOLN
1.0000 mg | Freq: Once | INTRAMUSCULAR | Status: AC
  Administered 2013-08-03: 1 mg via INTRAVENOUS
  Filled 2013-08-03: qty 1

## 2013-08-03 MED ORDER — ONDANSETRON HCL 4 MG/2ML IJ SOLN
4.0000 mg | Freq: Once | INTRAMUSCULAR | Status: AC
  Administered 2013-08-03: 4 mg via INTRAVENOUS
  Filled 2013-08-03: qty 2

## 2013-08-03 MED ORDER — SODIUM CHLORIDE 0.9 % IV BOLUS (SEPSIS)
1000.0000 mL | Freq: Once | INTRAVENOUS | Status: AC
  Administered 2013-08-03: 1000 mL via INTRAVENOUS

## 2013-08-03 NOTE — ED Notes (Signed)
Pt declines ECG at this time.

## 2013-08-03 NOTE — ED Notes (Signed)
Pt. reports epigastric pain with nausea and vomitting  onset 2 days ago .

## 2013-08-03 NOTE — ED Provider Notes (Signed)
CSN: [id number]     Arrival date & time 08/03/13  2216 History   First MD Initiated Contact with Patient 08/03/13 2228     Chief Complaint  Patient presents with  . Abdominal Pain   (Consider location/radiation/quality/duration/timing/severity/associated sxs/prior Treatment) HPI  [name] is a 42 y.o. 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 Normal bowel movements. Patient has been taking multiple antacids with little relief. She had arthroscopic surgery to the right knee 4 days ago. She denies any chest pain, shortness of breath, melena, hematochezia or vomiting. She states that the pain is similar to prior episodes of pancreatitis.   Past Medical History  Diagnosis Date  . Anxiety   . GERD (gastroesophageal reflux disease)   . History of alcohol abuse     no alcohol since 2004  . Migraine headache   . History of cocaine abuse     none since 2011, entered treatment  . History of kidney stones   . Arthritis     left knee  . Depression   . Complication of anesthesia 03/2011    history of aspiration after anesthesia  . History of esophageal dilatation     multiple  . History of pancreatitis 2012  . Hypertension     under control with med., has been on med. x 3 yr.  . Sleep apnea     only uses CPAP machine 3 x/week  . Medial meniscus tear 07/2013    right  . Cervical muscle strain 07/2013    current Prednisone taper  . Dental crowns present     x 2 upper front  . Genital herpes simplex type 2    Past Surgical History  Procedure Laterality Date  . Abdominal hysterectomy      partial  . Cesarean section      x 2  . Nasal septum surgery    . Carpal tunnel release  07/29/2012    Procedure: CARPAL TUNNEL RELEASE;  Surgeon: Eldred Manges, MD;  Location: [facility];  Service:  Orthopedics;  Laterality: Right;  Right carpal tunnel release  . Laparoscopic appendectomy  02/21/2006  . Laparoscopic lysis of adhesions  02/21/2006  . Cholecystectomy  04/05/2010  . Ercp w/ metal stent placement  03/14/2011  . Cystoscopy w/ ureteral stent placement Right 06/11/2007  . Nasal septoplasty w/ turbinoplasty Bilateral 07/26/2009    bilat. turb. reduction  . Knee arthroscopy Left     x 3  . Knee arthroscopy with medial menisectomy Right 07/31/2013    Procedure: KNEE ARTHROSCOPY WITH PARTIAL LATERAL MENISECTOMY, CHONDROPLASTY;  Surgeon: Loreta Ave, MD;  Location: [facility];  Service: Orthopedics;  Laterality: Right;   Family History  Problem Relation Age of Onset  . Heart disease Maternal Uncle   . Heart disease Maternal Grandmother   . Kidney cancer Mother   . Hypertension Mother   . Cancer Mother     renal  . Lung cancer Maternal Aunt   . Stroke Maternal Uncle 55  . Alcohol abuse Maternal Uncle    History  Substance Use Topics  . Smoking status: Former Smoker    Quit date: 06/05/2013  . Smokeless tobacco: Never Used  . Alcohol Use: No     Comment: none since 2004   OB History   Grav Para Term Preterm Abortions TAB  SAB Ect Mult Living                 Review of Systems 10 systems reviewed and found to be negative, except as noted in the HPI  Allergies  Imitrex; Trazodone and nefazodone; Effexor; Metoclopramide hcl; Morphine and related; Tegretol; and Topamax  Home Medications   Current Outpatient Rx  Name  Route  Sig  Dispense  Refill  . ALPRAZolam (XANAX XR) 0.5 MG 24 hr tablet   Oral   Take 1.5 mg by mouth at bedtime.         . ALPRAZolam (XANAX) 1 MG tablet   Oral   Take 1 mg by mouth daily as needed for anxiety.         Marland Kitchen amLODipine (NORVASC) 10 MG tablet   Oral   Take 1 tablet (10 mg total) by mouth daily.   90 tablet   1   . amphetamine-dextroamphetamine (ADDERALL) 30 MG tablet   Oral   Take 0.5 tablets (15 mg total) by  mouth 3 (three) times daily.   45 tablet   0   . cloNIDine (CATAPRES) 0.1 MG tablet   Oral   Take 0.1 mg by mouth 3 (three) times daily.         Marland Kitchen EPINEPHrine (EPIPEN) 0.3 mg/0.3 mL DEVI   Intramuscular   Inject 0.3 mLs (0.3 mg total) into the muscle as needed (inject at time of signs of anaphylaxis).   2 Device   0   . ibuprofen (ADVIL,MOTRIN) 200 MG tablet   Oral   Take 600 mg by mouth 2 (two) times daily as needed for pain.         Marland Kitchen ondansetron (ZOFRAN-ODT) 8 MG disintegrating tablet   Oral   Take 8 mg by mouth every 8 (eight) hours as needed for nausea.         Marland Kitchen oxyCODONE-acetaminophen (PERCOCET) 10-325 MG per tablet   Oral   Take 1 tablet by mouth every 4 (four) hours as needed for pain.   90 tablet   0   . pantoprazole (PROTONIX) 40 MG tablet   Oral   Take 40 mg by mouth at bedtime.          . sertraline (ZOLOFT) 100 MG tablet   Oral   Take 100 mg by mouth at bedtime.          BP 126/89  Pulse 95  Temp(Src) 98.3 F (36.8 C) (Oral)  Resp 26  Ht 5\' 6"  (1.676 m)  Wt 179 lb (81.194 kg)  BMI 28.91 kg/m2  SpO2 99% Physical Exam  Nursing note and vitals reviewed. Constitutional: She is oriented to person, place, and time. She appears well-developed and well-nourished. No distress.  Shaking, appears uncomfortable  HENT:  Head: Normocephalic.  Mouth/Throat: Oropharynx is clear and moist.  Eyes: Conjunctivae and EOM are normal. Pupils are equal, round, and reactive to light.  Neck: Normal range of motion.  Cardiovascular: Normal rate, regular rhythm, normal heart sounds and intact distal pulses.   Pulmonary/Chest: Effort normal and breath sounds normal. No stridor. No respiratory distress. She has no wheezes. She has no rales. She exhibits no tenderness.  Abdominal: Soft. Bowel sounds are normal. She exhibits no distension and no mass. There is tenderness. There is no rebound and no guarding.  Epigastric abdominal pain, no guarding or rebound.   Musculoskeletal: Normal range of motion.  Mild swelling to right knee, splint in place, surgical incision is clean dry and  intact. Neurovascularly intact.  Neurological: She is alert and oriented to person, place, and time.  Psychiatric: She has a normal mood and affect.    ED Course  Procedures (including critical care time) Labs Review Labs Reviewed  CBC WITH DIFFERENTIAL - Abnormal; Notable for the following:    WBC 13.5 (*)    Lymphs Abs 5.0 (*)    All other components within normal limits  COMPREHENSIVE METABOLIC PANEL - Abnormal; Notable for the following:    ALT 46 (*)    All other components within normal limits  LIPASE, BLOOD  URINALYSIS, ROUTINE W REFLEX MICROSCOPIC  POCT I-STAT TROPONIN I   Imaging Review No results found.  MDM   1. Epigastric abdominal pain     Filed Vitals:   08/03/13 2217 08/03/13 2307 08/03/13 2338 08/04/13 0000  BP: 126/89 131/74 123/88 125/83  Pulse: 95 80 69 75  Temp: 98.3 F (36.8 C)     TempSrc: Oral     Resp: 26 16 14    Height: 5\' 6"  (1.676 m)     Weight: 179 lb (81.194 kg)     SpO2: 99% 97% 99% 98%     [name] is a 42 y.o. female with severe epigastric abdominal pain worsening over the course of 2 days associated with nausea. Serial abdominal exams remained nonsurgical. Patient is much improved after IV Dilaudid. Blood work unremarkable, then she is appropriate for discharge at this time with no need for emergent intervention. Return cautions discussed.  Medications  sodium chloride 0.9 % bolus 1,000 mL (0 mLs Intravenous Stopped 08/03/13 2337)  HYDROmorphone (DILAUDID) injection 1 mg (1 mg Intravenous Given 08/03/13 2304)  ondansetron (ZOFRAN) injection 4 mg (4 mg Intravenous Given 08/03/13 2302)  prochlorperazine (COMPAZINE) injection 10 mg (10 mg Intravenous Given 08/03/13 2352)  HYDROmorphone (DILAUDID) injection 1 mg (1 mg Intravenous Given 08/03/13 2358)    Pt is hemodynamically stable, appropriate for, and amenable to  discharge at this time. Pt verbalized understanding and agrees with care plan. All questions answered. Outpatient follow-up and specific return precautions discussed.    Discharge Medication List as of 08/04/2013 12:11 AM    START taking these medications   Details  oxyCODONE-acetaminophen (PERCOCET/ROXICET) 5-325 MG per tablet Take 2 tablets by mouth every 4 (four) hours as needed for pain., Starting 08/04/2013, Until Discontinued, Print        Note: Portions of this report may have been transcribed using voice recognition software. Every effort was made to ensure accuracy; however, inadvertent computerized transcription errors may be present      Wynetta Emery, PA-C 08/04/13 0032

## 2013-08-03 NOTE — ED Notes (Signed)
Pt seen leaving ER.

## 2013-08-03 NOTE — ED Notes (Signed)
No answer when name called; pt not in waiting room

## 2013-08-04 MED ORDER — PROMETHAZINE HCL 25 MG PO TABS: 25.0000 mg | ORAL_TABLET | Freq: Four times a day (QID) | ORAL | Status: DC | PRN

## 2013-08-04 MED ORDER — OXYCODONE-ACETAMINOPHEN 5-325 MG PO TABS: 2.0000 | ORAL_TABLET | ORAL | Status: DC | PRN

## 2013-08-04 NOTE — ED Provider Notes (Signed)
Medical screening examination/treatment/procedure(s) were performed by non-physician practitioner and as supervising physician I was immediately available for consultation/collaboration.  Sunnie Nielsen, MD 08/04/13 [phone]

## 2013-08-04 NOTE — ED Notes (Signed)
Pt refused EKG. Pt informed of benefits and risks of not allowing the EKG to be completed.

## 2013-08-04 NOTE — ED Notes (Signed)
PA at bedside.

## 2013-08-05 ENCOUNTER — Encounter: Payer: Self-pay | Admitting: Physician Assistant

## 2013-08-05 DIAGNOSIS — M542 Cervicalgia: Secondary | ICD-10-CM

## 2013-08-05 DIAGNOSIS — M545 Low back pain, unspecified: Secondary | ICD-10-CM

## 2013-08-05 DIAGNOSIS — M25561 Pain in right knee: Secondary | ICD-10-CM

## 2013-08-05 MED ORDER — OXYCODONE-ACETAMINOPHEN 10-325 MG PO TABS: 1.0000 | ORAL_TABLET | ORAL | Status: DC | PRN

## 2013-08-05 NOTE — Telephone Encounter (Signed)
Patient notified by My Chart.  Rx printed.  Meds ordered this encounter  Medications  . oxyCODONE-acetaminophen (PERCOCET) 10-325 MG per tablet    Sig: Take 1 tablet by mouth every 4 (four) hours as needed for pain.    Dispense:  90 tablet    Refill:  0    Order Specific Question:  Supervising Provider    Answer:  DOOLITTLE, ROBERT P [[id number]]

## 2013-08-09 ENCOUNTER — Ambulatory Visit (INDEPENDENT_AMBULATORY_CARE_PROVIDER_SITE_OTHER): Payer: 59 | Admitting: Family Medicine

## 2013-08-09 ENCOUNTER — Emergency Department (HOSPITAL_COMMUNITY)
Admission: EM | Admit: 2013-08-09 | Discharge: 2013-08-10 | Disposition: A | Discharge status: Home / Self Care | Payer: 59 | Attending: Emergency Medicine | Admitting: Emergency Medicine

## 2013-08-09 ENCOUNTER — Encounter (HOSPITAL_COMMUNITY): Payer: Self-pay | Admitting: Emergency Medicine

## 2013-08-09 VITALS — BP 132/90 | HR 140 | Temp 98.5°F | Resp 18 | Ht 66.25 in | Wt 188.0 lb

## 2013-08-09 DIAGNOSIS — Z79899 Other long term (current) drug therapy: Secondary | ICD-10-CM | POA: Insufficient documentation

## 2013-08-09 DIAGNOSIS — Z98811 Dental restoration status: Secondary | ICD-10-CM | POA: Insufficient documentation

## 2013-08-09 DIAGNOSIS — M542 Cervicalgia: Secondary | ICD-10-CM | POA: Insufficient documentation

## 2013-08-09 DIAGNOSIS — F3289 Other specified depressive episodes: Secondary | ICD-10-CM | POA: Insufficient documentation

## 2013-08-09 DIAGNOSIS — Z87442 Personal history of urinary calculi: Secondary | ICD-10-CM | POA: Insufficient documentation

## 2013-08-09 DIAGNOSIS — M129 Arthropathy, unspecified: Secondary | ICD-10-CM | POA: Insufficient documentation

## 2013-08-09 DIAGNOSIS — Z9889 Other specified postprocedural states: Secondary | ICD-10-CM | POA: Insufficient documentation

## 2013-08-09 DIAGNOSIS — I10 Essential (primary) hypertension: Secondary | ICD-10-CM | POA: Insufficient documentation

## 2013-08-09 DIAGNOSIS — F329 Major depressive disorder, single episode, unspecified: Secondary | ICD-10-CM | POA: Insufficient documentation

## 2013-08-09 DIAGNOSIS — F411 Generalized anxiety disorder: Secondary | ICD-10-CM | POA: Insufficient documentation

## 2013-08-09 DIAGNOSIS — M5410 Radiculopathy, site unspecified: Secondary | ICD-10-CM

## 2013-08-09 DIAGNOSIS — Z87891 Personal history of nicotine dependence: Secondary | ICD-10-CM | POA: Insufficient documentation

## 2013-08-09 DIAGNOSIS — Z8619 Personal history of other infectious and parasitic diseases: Secondary | ICD-10-CM | POA: Insufficient documentation

## 2013-08-09 DIAGNOSIS — M62838 Other muscle spasm: Secondary | ICD-10-CM

## 2013-08-09 DIAGNOSIS — G4730 Sleep apnea, unspecified: Secondary | ICD-10-CM | POA: Insufficient documentation

## 2013-08-09 DIAGNOSIS — K219 Gastro-esophageal reflux disease without esophagitis: Secondary | ICD-10-CM | POA: Insufficient documentation

## 2013-08-09 DIAGNOSIS — G43909 Migraine, unspecified, not intractable, without status migrainosus: Secondary | ICD-10-CM | POA: Insufficient documentation

## 2013-08-09 DIAGNOSIS — IMO0002 Reserved for concepts with insufficient information to code with codable children: Secondary | ICD-10-CM | POA: Insufficient documentation

## 2013-08-09 MED ORDER — DIAZEPAM 5 MG PO TABS: 5.0000 mg | ORAL_TABLET | Freq: Two times a day (BID) | ORAL | Status: DC

## 2013-08-09 MED ORDER — PREDNISONE 10 MG PO TABS: 20.0000 mg | ORAL_TABLET | Freq: Every day | ORAL | Status: DC

## 2013-08-09 MED ORDER — SODIUM CHLORIDE 0.9 % IV BOLUS (SEPSIS)
1000.0000 mL | Freq: Once | INTRAVENOUS | Status: AC
  Administered 2013-08-09: 1000 mL via INTRAVENOUS

## 2013-08-09 MED ORDER — HYDROCODONE-ACETAMINOPHEN 5-325 MG PO TABS: 2.0000 | ORAL_TABLET | ORAL | Status: DC | PRN

## 2013-08-09 MED ORDER — DIAZEPAM 5 MG/ML IJ SOLN
5.0000 mg | Freq: Once | INTRAMUSCULAR | Status: AC
  Administered 2013-08-09: 5 mg via INTRAVENOUS
  Filled 2013-08-09: qty 2

## 2013-08-09 MED ORDER — DIAZEPAM 5 MG/ML IJ SOLN: 5.0000 mg | Freq: Once | INTRAMUSCULAR | Status: DC

## 2013-08-09 MED ORDER — HYDROMORPHONE HCL PF 1 MG/ML IJ SOLN
1.0000 mg | Freq: Once | INTRAMUSCULAR | Status: AC
  Administered 2013-08-09: 1 mg via INTRAVENOUS
  Filled 2013-08-09: qty 1

## 2013-08-09 NOTE — ED Provider Notes (Signed)
CSN: [id number]     Arrival date & time 08/09/13  2031 History   First MD Initiated Contact with Patient 08/09/13 2130     Chief Complaint  Patient presents with  . Torticollis   (Consider location/radiation/quality/duration/timing/severity/associated sxs/prior Treatment) HPI 42 year old female who comes today with left-sided neck pain which she has had intermittently for several weeks now. She has been seen at least twice in the emergency department has had an MRI of her neck that showed a small central disc at C5-C6. She has been taking multiple medications at home and feels that it has worsened since twisting 2 days ago. She has been unable to control the pain since that time. She has taken Percocet, Robaxin, Flexeril, and today prednisone 60 mg. She has been seen in followup by her primary care doctor since initial evaluation. She states she has some numbness in her left small finger. The pain is in the left side of her posterior neck that shoots down her arm. She has not had any weakness. She denies fever or chills. Past Medical History  Diagnosis Date  . Anxiety   . GERD (gastroesophageal reflux disease)   . History of alcohol abuse     no alcohol since 2004  . Migraine headache   . History of cocaine abuse     none since 2011, entered treatment  . History of kidney stones   . Arthritis     left knee  . Depression   . Complication of anesthesia 03/2011    history of aspiration after anesthesia  . History of esophageal dilatation     multiple  . History of pancreatitis 2012  . Hypertension     under control with med., has been on med. x 3 yr.  . Sleep apnea     only uses CPAP machine 3 x/week  . Medial meniscus tear 07/2013    right  . Cervical muscle strain 07/2013    current Prednisone taper  . Dental crowns present     x 2 upper front  . Genital herpes simplex type 2    Past Surgical History  Procedure Laterality Date  . Abdominal hysterectomy      partial  . Cesarean  section      x 2  . Nasal septum surgery    . Carpal tunnel release  07/29/2012    Procedure: CARPAL TUNNEL RELEASE;  Surgeon: Eldred Manges, MD;  Location: [facility];  Service: Orthopedics;  Laterality: Right;  Right carpal tunnel release  . Laparoscopic appendectomy  02/21/2006  . Laparoscopic lysis of adhesions  02/21/2006  . Cholecystectomy  04/05/2010  . Ercp w/ metal stent placement  03/14/2011  . Cystoscopy w/ ureteral stent placement Right 06/11/2007  . Nasal septoplasty w/ turbinoplasty Bilateral 07/26/2009    bilat. turb. reduction  . Knee arthroscopy Left     x 3  . Knee arthroscopy with medial menisectomy Right 07/31/2013    Procedure: KNEE ARTHROSCOPY WITH PARTIAL LATERAL MENISECTOMY, CHONDROPLASTY;  Surgeon: Loreta Ave, MD;  Location: [facility];  Service: Orthopedics;  Laterality: Right;   Family History  Problem Relation Age of Onset  . Heart disease Maternal Uncle   . Heart disease Maternal Grandmother   . Kidney cancer Mother   . Hypertension Mother   . Cancer Mother     renal  . Lung cancer Maternal Aunt   . Stroke Maternal Uncle 55  . Alcohol abuse Maternal Uncle    History  Substance Use Topics  . Smoking status: Former Smoker    Quit date: 06/05/2013  . Smokeless tobacco: Never Used  . Alcohol Use: No     Comment: none since 2004   OB History   Grav Para Term Preterm Abortions TAB SAB Ect Mult Living                 Review of Systems  All other systems reviewed and are negative.    Allergies  Imitrex; Trazodone and nefazodone; Effexor; Metoclopramide hcl; Morphine and related; Tegretol; and Topamax  Home Medications   Current Outpatient Rx  Name  Route  Sig  Dispense  Refill  . ALPRAZolam (XANAX XR) 0.5 MG 24 hr tablet   Oral   Take 1.5 mg by mouth at bedtime.         . ALPRAZolam (XANAX) 1 MG tablet   Oral   Take 1 mg by mouth daily as needed for anxiety.         Marland Kitchen amLODipine (NORVASC) 10 MG tablet    Oral   Take 10 mg by mouth every morning.         Marland Kitchen amphetamine-dextroamphetamine (ADDERALL) 30 MG tablet   Oral   Take 0.5 tablets (15 mg total) by mouth 3 (three) times daily.   45 tablet   0   . cloNIDine (CATAPRES) 0.1 MG tablet   Oral   Take 0.1 mg by mouth 3 (three) times daily.         Marland Kitchen ibuprofen (ADVIL,MOTRIN) 200 MG tablet   Oral   Take 600 mg by mouth 2 (two) times daily as needed for pain.         Marland Kitchen ondansetron (ZOFRAN-ODT) 8 MG disintegrating tablet   Oral   Take 8 mg by mouth every 8 (eight) hours as needed for nausea.         Marland Kitchen oxyCODONE-acetaminophen (PERCOCET) 10-325 MG per tablet   Oral   Take 1 tablet by mouth every 4 (four) hours as needed for pain.   90 tablet   0   . pantoprazole (PROTONIX) 40 MG tablet   Oral   Take 40 mg by mouth at bedtime.          . sertraline (ZOLOFT) 100 MG tablet   Oral   Take 100 mg by mouth at bedtime.         Marland Kitchen EPINEPHrine (EPIPEN) 0.3 mg/0.3 mL DEVI   Intramuscular   Inject 0.3 mLs (0.3 mg total) into the muscle as needed (inject at time of signs of anaphylaxis).   2 Device   0    BP 133/87  Pulse 115  Temp(Src) 98.1 F (36.7 C) (Oral)  Resp 18  Ht 5\' 6"  (1.676 m)  Wt 180 lb (81.647 kg)  BMI 29.07 kg/m2  SpO2 97% Physical Exam  Nursing note and vitals reviewed. Constitutional: She is oriented to person, place, and time. She appears well-developed and well-nourished.  HENT:  Head: Normocephalic and atraumatic.  Right Ear: Tympanic membrane and external ear normal.  Left Ear: Tympanic membrane and external ear normal.  Nose: Nose normal. Right sinus exhibits no maxillary sinus tenderness and no frontal sinus tenderness. Left sinus exhibits no maxillary sinus tenderness and no frontal sinus tenderness.  Mouth/Throat: Oropharynx is clear and moist.  Eyes: Conjunctivae and EOM are normal. Pupils are equal, round, and reactive to light. Right eye exhibits no nystagmus. Left eye exhibits no nystagmus.   Neck: Normal range of motion. Neck  supple. No tracheal deviation present. No thyromegaly present.  Patient with tenderness to palpation over trapezius on left. She does have full active range of motion of her neck. There is no tenderness of the cervical spine.  Cardiovascular: Normal rate, regular rhythm, normal heart sounds and intact distal pulses.   Pulmonary/Chest: Effort normal and breath sounds normal. No respiratory distress. She exhibits no tenderness.  Abdominal: Soft. Bowel sounds are normal. She exhibits no distension and no mass. There is no tenderness.  Musculoskeletal: Normal range of motion. She exhibits no edema and no tenderness.  Neurological: She is alert and oriented to person, place, and time. She has normal strength and normal reflexes. No sensory deficit. She displays a negative Romberg sign. GCS eye subscore is 4. GCS verbal subscore is 5. GCS motor subscore is 6.  Reflex Scores:      Tricep reflexes are 2+ on the right side and 2+ on the left side.      Bicep reflexes are 2+ on the right side and 2+ on the left side.      Brachioradialis reflexes are 2+ on the right side and 2+ on the left side.      Patellar reflexes are 2+ on the right side and 2+ on the left side.      Achilles reflexes are 2+ on the right side and 2+ on the left side. Patient with normal gait without ataxia, shuffling, spasm, or antalgia. Speech is normal without dysarthria, dysphasia, or aphasia. Muscle strength is 5/5 in bilateral shoulders, elbow flexor and extensors, wrist flexor and extensors, and intrinsic hand muscles. 5/5 bilateral lower extremity hip flexors, extensors, knee flexors and extensors, and ankle dorsi and plantar flexors.    Skin: Skin is warm and dry. No rash noted.  Psychiatric: She has a normal mood and affect. Her behavior is normal. Judgment and thought content normal.    ED Course  Procedures (including critical care time) Labs Review Labs Reviewed - No data to  display Imaging Review No results found.  MDM  No diagnosis found. I have reviewed her previous MRI. She is given IV Dilaudid and Valium here. She'll be given a prescription for Vicodin Valium, and prednisone.  She is cautioned regarding using the Vicodin instead of the Percocet and not with the Percocet. She will continue the muscle relaxants. She is also cautioned regarding overdose of Tylenol. She is given referral to neurosurgery for followup. She's not appear to have any acute neurological deficits at this time but is counseled regarding acute return precautions.   Hilario Quarry, MD 08/09/13 2258

## 2013-08-09 NOTE — ED Notes (Addendum)
Pt c/o pain from base of skull down L side of neck radiating down L arm x 3 days. Pt has had recent Cspine CT and MR, pt seen by PCP just PTA, pt has taken percocet without relief

## 2013-08-09 NOTE — Progress Notes (Signed)
  Subjective:    Patient ID: [name], female    DOB: 01-06-1971, 42 y.o.   MRN: [id number]  HPI  42 year old female presents for evaluation of neck pain. Symptoms started 3 days ago and have progressively worsened. Had an MRI on 07/21/13 that showed a small bulging disk at C4-C5.  Is concerned that this is the cause of her pain. She has been unable to sleep for the last 2 nights due to pain.  Has pain with ROM of her neck and has pain that is shooting down her left arm.  Hx of muscle spasm in her neck. She has been taking Percocet, Flexeril 5 mg tid, and ibuprofen. States none of these have helped her symptoms at all and she has been unable to get any relief. Pain is 8/10 today.  Has very limited ROM of her neck.  Denies weakness in her arms but does have paresthesias.      Review of Systems  Constitutional: Negative for fever and chills.  Gastrointestinal: Negative for nausea and vomiting.  Musculoskeletal: Positive for back pain (neck). Negative for joint swelling.  Skin: Negative for rash.  Neurological: Positive for numbness (left arm). Negative for dizziness and weakness.       Objective:   Physical Exam  Constitutional: She is oriented to person, place, and time. She appears well-developed and well-nourished. She appears distressed (tearful, pacing room).  Eyes: Conjunctivae are normal.  Neck: Normal range of motion.  Cardiovascular: Tachycardia present.   Pulmonary/Chest: Effort normal.  Musculoskeletal:       Cervical back: She exhibits decreased range of motion, pain and spasm. She exhibits no bony tenderness.  Neurological: She is alert and oriented to person, place, and time. She has normal strength.  Reflex Scores:      Bicep reflexes are 2+ on the right side and 2+ on the left side. Psychiatric: She has a normal mood and affect. Her behavior is normal. Judgment and thought content normal.          Assessment & Plan:  Spasm of muscle  Neck pain on left side  Due  to medications she has already tried, I do not think we will be able to get adequate pain relief here tonight.  She does not want to spend another night in this amount of pain, so I have sent her to [facility] ED for further evaluation and treatment.  She has an appointment on 08/12/13 with Dr. Eulah Pont for a recheck on her knee. I suggest she discuss her neck pain with him at that time as well or let us know if we need to get her in to see another Orthopedist.

## 2013-08-10 NOTE — Progress Notes (Signed)
Patient discussed with Rhoderick Moody, PA-C. Agree with assessment and plan of care per her note.

## 2013-08-10 NOTE — ED Notes (Addendum)
Unsuccessful attempt at starting an IV to the RFA. Pt states "that felt like an arterial stick, I am just going to leave." The patient was informed that our goal was to decrease her pain and make her more comfortable. Offered to obtain another RN to attempt IV placement and pt significant other requested to try a vein in the left hand, of which the patient stated that the pain increased with movement of the arm. Informed patient that if movement of the hand made the pain worse, this would not be an ideal site to place IV. Another RN obtained for another IV start attempt. Pt willing to stay for treatment.

## 2013-08-15 ENCOUNTER — Emergency Department (HOSPITAL_COMMUNITY)
Admission: EM | Admit: 2013-08-15 | Discharge: 2013-08-15 | Disposition: A | Discharge status: Home / Self Care | Payer: 59 | Attending: Emergency Medicine | Admitting: Emergency Medicine

## 2013-08-15 ENCOUNTER — Encounter (HOSPITAL_COMMUNITY): Payer: Self-pay | Admitting: Emergency Medicine

## 2013-08-15 DIAGNOSIS — F411 Generalized anxiety disorder: Secondary | ICD-10-CM | POA: Insufficient documentation

## 2013-08-15 DIAGNOSIS — M5412 Radiculopathy, cervical region: Secondary | ICD-10-CM | POA: Insufficient documentation

## 2013-08-15 DIAGNOSIS — R110 Nausea: Secondary | ICD-10-CM | POA: Insufficient documentation

## 2013-08-15 DIAGNOSIS — F329 Major depressive disorder, single episode, unspecified: Secondary | ICD-10-CM | POA: Insufficient documentation

## 2013-08-15 DIAGNOSIS — Z98811 Dental restoration status: Secondary | ICD-10-CM | POA: Insufficient documentation

## 2013-08-15 DIAGNOSIS — Z79899 Other long term (current) drug therapy: Secondary | ICD-10-CM | POA: Insufficient documentation

## 2013-08-15 DIAGNOSIS — F1021 Alcohol dependence, in remission: Secondary | ICD-10-CM | POA: Insufficient documentation

## 2013-08-15 DIAGNOSIS — IMO0002 Reserved for concepts with insufficient information to code with codable children: Secondary | ICD-10-CM | POA: Insufficient documentation

## 2013-08-15 DIAGNOSIS — G4730 Sleep apnea, unspecified: Secondary | ICD-10-CM | POA: Insufficient documentation

## 2013-08-15 DIAGNOSIS — G8929 Other chronic pain: Secondary | ICD-10-CM | POA: Insufficient documentation

## 2013-08-15 DIAGNOSIS — I10 Essential (primary) hypertension: Secondary | ICD-10-CM | POA: Insufficient documentation

## 2013-08-15 DIAGNOSIS — G43909 Migraine, unspecified, not intractable, without status migrainosus: Secondary | ICD-10-CM | POA: Insufficient documentation

## 2013-08-15 DIAGNOSIS — M129 Arthropathy, unspecified: Secondary | ICD-10-CM | POA: Insufficient documentation

## 2013-08-15 DIAGNOSIS — Z8619 Personal history of other infectious and parasitic diseases: Secondary | ICD-10-CM | POA: Insufficient documentation

## 2013-08-15 DIAGNOSIS — Z87828 Personal history of other (healed) physical injury and trauma: Secondary | ICD-10-CM | POA: Insufficient documentation

## 2013-08-15 DIAGNOSIS — Z87442 Personal history of urinary calculi: Secondary | ICD-10-CM | POA: Insufficient documentation

## 2013-08-15 DIAGNOSIS — Z87891 Personal history of nicotine dependence: Secondary | ICD-10-CM | POA: Insufficient documentation

## 2013-08-15 DIAGNOSIS — K219 Gastro-esophageal reflux disease without esophagitis: Secondary | ICD-10-CM | POA: Insufficient documentation

## 2013-08-15 DIAGNOSIS — F3289 Other specified depressive episodes: Secondary | ICD-10-CM | POA: Insufficient documentation

## 2013-08-15 MED ORDER — HYDROMORPHONE HCL PF 1 MG/ML IJ SOLN
1.0000 mg | Freq: Once | INTRAMUSCULAR | Status: AC
  Administered 2013-08-15: 1 mg via INTRAVENOUS
  Filled 2013-08-15: qty 1

## 2013-08-15 MED ORDER — HYDROMORPHONE HCL PF 2 MG/ML IJ SOLN
2.0000 mg | Freq: Once | INTRAMUSCULAR | Status: AC
  Administered 2013-08-15: 2 mg via INTRAMUSCULAR
  Filled 2013-08-15: qty 1

## 2013-08-15 MED ORDER — DIAZEPAM 5 MG/ML IJ SOLN
5.0000 mg | Freq: Once | INTRAMUSCULAR | Status: AC
  Administered 2013-08-15: 5 mg via INTRAMUSCULAR
  Filled 2013-08-15: qty 2

## 2013-08-15 MED ORDER — DIAZEPAM 5 MG PO TABS: 5.0000 mg | ORAL_TABLET | Freq: Four times a day (QID) | ORAL | Status: DC | PRN

## 2013-08-15 MED ORDER — PROMETHAZINE HCL 25 MG/ML IJ SOLN
12.5000 mg | Freq: Once | INTRAMUSCULAR | Status: AC
  Administered 2013-08-15: 12.5 mg via INTRAVENOUS
  Filled 2013-08-15: qty 1

## 2013-08-15 MED ORDER — DIAZEPAM 5 MG/ML IJ SOLN
5.0000 mg | Freq: Once | INTRAMUSCULAR | Status: AC
  Administered 2013-08-15: 5 mg via INTRAVENOUS
  Filled 2013-08-15: qty 2

## 2013-08-15 NOTE — ED Provider Notes (Signed)
CSN: [id number]     Arrival date & time 08/15/13  1840 History   First MD Initiated Contact with Patient 08/15/13 1849     Chief Complaint  Patient presents with  . Arm Pain   (Consider location/radiation/quality/duration/timing/severity/associated sxs/prior Treatment) HPI Comments: Patient here with left sided neck pain with radiation down her left arm.  She reports she had a MRI in September which shows a bulging disc.  She has been seen by her orthopedist at [facility] who has her scheduled for steroid injection in late October.  She reports that her pain had been fairly well controlled with her percocet and flexeril until today when the pain began to worsen.  She denies any loss of function of the arm, weakness, no change in numbness (baseline numbness to left 5th finger), loss of control of bowels or bladder, or urinary retention.  She reports nausea with "dry heaves" over the past several hours as well.  Has not tried to get into see her PCP, family practice.  Denies headache, vomiting, chest pain, shortness of breath, abdominal pain, diarrhea or constipation.  Patient is a 42 y.o. female presenting with arm pain. The history is provided by the patient and a friend. No language interpreter was used.  Arm Pain This is a chronic problem. The current episode started today. The problem occurs constantly. The problem has been gradually worsening. Associated symptoms include myalgias, nausea, neck pain and numbness. Pertinent negatives include no abdominal pain, arthralgias, chest pain, congestion, coughing, diaphoresis, fatigue, fever, headaches, swollen glands, urinary symptoms, vomiting or weakness. The symptoms are aggravated by twisting. She has tried nothing for the symptoms. The treatment provided no relief.    Past Medical History  Diagnosis Date  . Anxiety   . GERD (gastroesophageal reflux disease)   . History of alcohol abuse     no alcohol since 2004  . Migraine headache   .  History of cocaine abuse     none since 2011, entered treatment  . History of kidney stones   . Arthritis     left knee  . Depression   . Complication of anesthesia 03/2011    history of aspiration after anesthesia  . History of esophageal dilatation     multiple  . History of pancreatitis 2012  . Hypertension     under control with med., has been on med. x 3 yr.  . Sleep apnea     only uses CPAP machine 3 x/week  . Medial meniscus tear 07/2013    right  . Cervical muscle strain 07/2013    current Prednisone taper  . Dental crowns present     x 2 upper front  . Genital herpes simplex type 2    Past Surgical History  Procedure Laterality Date  . Abdominal hysterectomy      partial  . Cesarean section      x 2  . Nasal septum surgery    . Carpal tunnel release  07/29/2012    Procedure: CARPAL TUNNEL RELEASE;  Surgeon: Eldred Manges, MD;  Location: [facility];  Service: Orthopedics;  Laterality: Right;  Right carpal tunnel release  . Laparoscopic appendectomy  02/21/2006  . Laparoscopic lysis of adhesions  02/21/2006  . Cholecystectomy  04/05/2010  . Ercp w/ metal stent placement  03/14/2011  . Cystoscopy w/ ureteral stent placement Right 06/11/2007  . Nasal septoplasty w/ turbinoplasty Bilateral 07/26/2009    bilat. turb. reduction  . Knee arthroscopy Left  x 3  . Knee arthroscopy with medial menisectomy Right 07/31/2013    Procedure: KNEE ARTHROSCOPY WITH PARTIAL LATERAL MENISECTOMY, CHONDROPLASTY;  Surgeon: Loreta Ave, MD;  Location: [facility];  Service: Orthopedics;  Laterality: Right;   Family History  Problem Relation Age of Onset  . Heart disease Maternal Uncle   . Heart disease Maternal Grandmother   . Kidney cancer Mother   . Hypertension Mother   . Cancer Mother     renal  . Lung cancer Maternal Aunt   . Stroke Maternal Uncle 55  . Alcohol abuse Maternal Uncle    History  Substance Use Topics  . Smoking status: Former Smoker     Quit date: 06/05/2013  . Smokeless tobacco: Never Used  . Alcohol Use: No     Comment: none since 2004   OB History   Grav Para Term Preterm Abortions TAB SAB Ect Mult Living                 Review of Systems  Constitutional: Negative for fever, diaphoresis and fatigue.  HENT: Negative for congestion.   Respiratory: Negative for cough.   Cardiovascular: Negative for chest pain.  Gastrointestinal: Positive for nausea. Negative for vomiting and abdominal pain.  Musculoskeletal: Positive for myalgias and neck pain. Negative for arthralgias.  Neurological: Positive for numbness. Negative for weakness and headaches.  All other systems reviewed and are negative.    Allergies  Imitrex; Trazodone and nefazodone; Effexor; Metoclopramide hcl; Morphine and related; Tegretol; and Topamax  Home Medications   Current Outpatient Rx  Name  Route  Sig  Dispense  Refill  . ALPRAZolam (XANAX XR) 0.5 MG 24 hr tablet   Oral   Take 1.5 mg by mouth at bedtime.         . ALPRAZolam (XANAX) 1 MG tablet   Oral   Take 1 mg by mouth daily as needed for anxiety.         Marland Kitchen amLODipine (NORVASC) 10 MG tablet   Oral   Take 10 mg by mouth every morning.         Marland Kitchen amphetamine-dextroamphetamine (ADDERALL) 30 MG tablet   Oral   Take 0.5 tablets (15 mg total) by mouth 3 (three) times daily.   45 tablet   0   . cloNIDine (CATAPRES) 0.1 MG tablet   Oral   Take 0.1 mg by mouth 3 (three) times daily.         . cyclobenzaprine (FLEXERIL) 10 MG tablet   Oral   Take 10 mg by mouth 3 (three) times daily as needed for muscle spasms.         . diazepam (VALIUM) 5 MG tablet   Oral   Take 1 tablet (5 mg total) by mouth 2 (two) times daily.   10 tablet   0   . ibuprofen (ADVIL,MOTRIN) 200 MG tablet   Oral   Take 600 mg by mouth 2 (two) times daily as needed for pain.         Marland Kitchen ondansetron (ZOFRAN-ODT) 8 MG disintegrating tablet   Oral   Take 8 mg by mouth every 8 (eight) hours as needed  for nausea.         Marland Kitchen oxyCODONE-acetaminophen (PERCOCET) 10-325 MG per tablet   Oral   Take 1 tablet by mouth every 4 (four) hours as needed for pain.   90 tablet   0   . pantoprazole (PROTONIX) 40 MG tablet   Oral  Take 40 mg by mouth at bedtime.          . sertraline (ZOLOFT) 100 MG tablet   Oral   Take 100 mg by mouth at bedtime.         Marland Kitchen EPINEPHrine (EPIPEN) 0.3 mg/0.3 mL DEVI   Intramuscular   Inject 0.3 mLs (0.3 mg total) into the muscle as needed (inject at time of signs of anaphylaxis).   2 Device   0   . predniSONE (DELTASONE) 10 MG tablet   Oral   Take 2 tablets (20 mg total) by mouth daily.   15 tablet   0    BP 122/80  Pulse 97  Temp(Src) 98.3 F (36.8 C) (Oral)  Resp 18  SpO2 100% Physical Exam  Nursing note and vitals reviewed. Constitutional: She is oriented to person, place, and time. She appears well-developed and well-nourished.  Uncomfortable appearing  HENT:  Head: Normocephalic and atraumatic.  Eyes: Conjunctivae are normal. No scleral icterus.  Neck: Neck supple. Muscular tenderness present. No spinous process tenderness present.    Cardiovascular: Normal rate.   Pulmonary/Chest: Effort normal. No respiratory distress.  Musculoskeletal: Normal range of motion. She exhibits no edema and no tenderness.  Pain to palpation to left trapezius muscle  Neurological: She is alert and oriented to person, place, and time. She exhibits normal muscle tone. Coordination normal.  Skin: Skin is warm and dry. No rash noted. No erythema. No pallor.  Psychiatric: She has a normal mood and affect. Her behavior is normal. Judgment and thought content normal.    ED Course  Procedures (including critical care time) Labs Review Labs Reviewed - No data to display Imaging Review No results found.  EKG Interpretation   None      8:10 PM Reports marked improvement in pain, denies nausea currently.  8:55 PM Reports pain has returned but would  like next injection IM so that it will last longer, feels like she can go home after this.  MDM  Cervical radiculopathy   Review of MRI reveals C5-6 with central disc protrusion.  Is scheduled for steroid injections by ortho at the end of the month, pain better under control here, no alarming signs to suggest cauda equina, epidural hematoma.  No change in usual symptoms.  Will discharge on current pain medication regimen, will add valium po for short course.   Izola Price Marisue Humble, [place] 08/15/13 2058

## 2013-08-15 NOTE — ED Notes (Addendum)
Pt reports that in mid September her left arm began to hurt. Pt has had an MRI completed which per the patient showed a bulge at C5-C6. Pt also reporting nausea. Pt has been seen several times in the ED for same, pt denies any new symptoms. Pt states the Dr. She was referred to is unable to complete the consult until 08/25/13. Pt is A/O x4 and in NAD.

## 2013-08-15 NOTE — ED Provider Notes (Signed)
Medical screening examination/treatment/procedure(s) were performed by non-physician practitioner and as supervising physician I was immediately available for consultation/collaboration.  Flint Melter, MD 08/15/13 2209

## 2013-08-16 ENCOUNTER — Encounter (HOSPITAL_COMMUNITY): Payer: Self-pay | Admitting: Emergency Medicine

## 2013-08-16 ENCOUNTER — Emergency Department (HOSPITAL_COMMUNITY): Payer: 59

## 2013-08-16 ENCOUNTER — Inpatient Hospital Stay (HOSPITAL_COMMUNITY)
Admission: EM | Admit: 2013-08-16 | Discharge: 2013-08-16 | DRG: 556 | Discharge status: Against Medical Advice | Payer: 59 | Attending: Internal Medicine | Admitting: Internal Medicine

## 2013-08-16 ENCOUNTER — Inpatient Hospital Stay (HOSPITAL_COMMUNITY): Payer: 59

## 2013-08-16 DIAGNOSIS — Z765 Malingerer [conscious simulation]: Secondary | ICD-10-CM

## 2013-08-16 DIAGNOSIS — M542 Cervicalgia: Secondary | ICD-10-CM | POA: Diagnosis present

## 2013-08-16 DIAGNOSIS — Z79899 Other long term (current) drug therapy: Secondary | ICD-10-CM

## 2013-08-16 DIAGNOSIS — M62838 Other muscle spasm: Principal | ICD-10-CM

## 2013-08-16 DIAGNOSIS — G894 Chronic pain syndrome: Secondary | ICD-10-CM | POA: Diagnosis present

## 2013-08-16 DIAGNOSIS — F1910 Other psychoactive substance abuse, uncomplicated: Secondary | ICD-10-CM

## 2013-08-16 DIAGNOSIS — R112 Nausea with vomiting, unspecified: Secondary | ICD-10-CM

## 2013-08-16 DIAGNOSIS — K219 Gastro-esophageal reflux disease without esophagitis: Secondary | ICD-10-CM | POA: Diagnosis present

## 2013-08-16 DIAGNOSIS — G4733 Obstructive sleep apnea (adult) (pediatric): Secondary | ICD-10-CM | POA: Diagnosis present

## 2013-08-16 DIAGNOSIS — I10 Essential (primary) hypertension: Secondary | ICD-10-CM | POA: Diagnosis present

## 2013-08-16 DIAGNOSIS — Z791 Long term (current) use of non-steroidal anti-inflammatories (NSAID): Secondary | ICD-10-CM

## 2013-08-16 DIAGNOSIS — Z87891 Personal history of nicotine dependence: Secondary | ICD-10-CM

## 2013-08-16 DIAGNOSIS — G8929 Other chronic pain: Secondary | ICD-10-CM | POA: Diagnosis present

## 2013-08-16 DIAGNOSIS — R109 Unspecified abdominal pain: Secondary | ICD-10-CM | POA: Diagnosis present

## 2013-08-16 DIAGNOSIS — M5412 Radiculopathy, cervical region: Secondary | ICD-10-CM

## 2013-08-16 HISTORY — DX: Malingerer (conscious simulation): Z76.5

## 2013-08-16 LAB — CBC WITH DIFFERENTIAL/PLATELET
Basophils Relative: 0 % (ref 0–1)
Eosinophils Relative: 2 % (ref 0–5)
HCT: 38.7 % (ref 36.0–46.0)
Hemoglobin: 12.8 g/dL (ref 12.0–15.0)
Lymphocytes Relative: 40 % (ref 12–46)
MCHC: 33.1 g/dL (ref 30.0–36.0)
MCV: 89.2 fL (ref 78.0–100.0)
Monocytes Absolute: 0.5 10*3/uL (ref 0.1–1.0)
Monocytes Relative: 5 % (ref 3–12)
Neutro Abs: 4.7 10*3/uL (ref 1.7–7.7)

## 2013-08-16 LAB — POCT I-STAT, CHEM 8
BUN: 11 mg/dL (ref 6–23)
Calcium, Ion: 1.24 mmol/L — ABNORMAL HIGH (ref 1.12–1.23)
Chloride: 104 mEq/L (ref 96–112)
Creatinine, Ser: 0.9 mg/dL (ref 0.50–1.10)
Glucose, Bld: 91 mg/dL (ref 70–99)
Potassium: 3.6 mEq/L (ref 3.5–5.1)

## 2013-08-16 MED ORDER — DIAZEPAM 5 MG/ML IJ SOLN
5.0000 mg | Freq: Once | INTRAMUSCULAR | Status: AC
  Administered 2013-08-16: 5 mg via INTRAVENOUS
  Filled 2013-08-16: qty 2

## 2013-08-16 MED ORDER — BISACODYL 5 MG PO TBEC: 5.0000 mg | DELAYED_RELEASE_TABLET | Freq: Every day | ORAL | Status: DC | PRN

## 2013-08-16 MED ORDER — OXYCODONE-ACETAMINOPHEN 10-325 MG PO TABS: 1.0000 | ORAL_TABLET | ORAL | Status: DC | PRN

## 2013-08-16 MED ORDER — ALPRAZOLAM ER 1 MG PO TB24: 1.5000 mg | ORAL_TABLET | Freq: Every day | ORAL | Status: DC

## 2013-08-16 MED ORDER — ONDANSETRON HCL 4 MG/2ML IJ SOLN
4.0000 mg | Freq: Once | INTRAMUSCULAR | Status: AC
  Administered 2013-08-16: 4 mg via INTRAVENOUS
  Filled 2013-08-16: qty 2

## 2013-08-16 MED ORDER — ENOXAPARIN SODIUM 40 MG/0.4ML ~~LOC~~ SOLN: 40.0000 mg | SUBCUTANEOUS | Status: DC

## 2013-08-16 MED ORDER — BUTALBITAL-APAP-CAFFEINE 50-325-40 MG PO TABS
1.0000 | ORAL_TABLET | Freq: Once | ORAL | Status: DC
  Filled 2013-08-16: qty 1

## 2013-08-16 MED ORDER — HYDROMORPHONE HCL PF 1 MG/ML IJ SOLN
1.0000 mg | Freq: Once | INTRAMUSCULAR | Status: AC
  Administered 2013-08-16: 1 mg via INTRAVENOUS
  Filled 2013-08-16: qty 1

## 2013-08-16 MED ORDER — METHOCARBAMOL 100 MG/ML IJ SOLN
1000.0000 mg | Freq: Once | INTRAVENOUS | Status: DC
  Filled 2013-08-16: qty 10

## 2013-08-16 MED ORDER — ONDANSETRON HCL 4 MG PO TABS: 4.0000 mg | ORAL_TABLET | Freq: Four times a day (QID) | ORAL | Status: DC | PRN

## 2013-08-16 MED ORDER — AMLODIPINE BESYLATE 10 MG PO TABS: 10.0000 mg | ORAL_TABLET | Freq: Every morning | ORAL | Status: DC

## 2013-08-16 MED ORDER — SERTRALINE HCL 50 MG PO TABS: 100.0000 mg | ORAL_TABLET | Freq: Every day | ORAL | Status: DC

## 2013-08-16 MED ORDER — ONDANSETRON HCL 4 MG/2ML IJ SOLN: 4.0000 mg | Freq: Four times a day (QID) | INTRAMUSCULAR | Status: DC | PRN

## 2013-08-16 MED ORDER — SENNA 8.6 MG PO TABS: 1.0000 | ORAL_TABLET | Freq: Two times a day (BID) | ORAL | Status: DC

## 2013-08-16 MED ORDER — CLONIDINE HCL 0.1 MG PO TABS: 0.1000 mg | ORAL_TABLET | Freq: Three times a day (TID) | ORAL | Status: DC

## 2013-08-16 MED ORDER — PANTOPRAZOLE SODIUM 40 MG PO TBEC: 40.0000 mg | DELAYED_RELEASE_TABLET | Freq: Every day | ORAL | Status: DC

## 2013-08-16 MED ORDER — SODIUM CHLORIDE 0.9 % IV SOLN: INTRAVENOUS | Status: DC

## 2013-08-16 NOTE — H&P (Signed)
[facility] History and Physical  SHERROL VICARS WUJ:[id number] DOB: Jan 24, 1971 DOA: 08/16/2013  Referring physician:  PCP: JEFFERY,[name], PA-C  Specialists:   Chief Complaint: Left neck pain acute/chronic  HPI: Genelda E Ducre  PMHx migraine headache, OSA, migraine headache, depression, ADD, substance abuse, chronic pain, drug-seeking behavior. Patient here with left sided neck pain with radiation down her left arm. She reports she had a MRI in September which shows a bulging disc. She had appointment on 20 October for C-spine ESI by her orthopedist at [facility] (Dr. Gerri Spore).  She reports that her pain had been fairly well controlled with her percocet and flexeril until today when the pain began to worsen. She denies any loss of function of the arm, weakness, no change in numbness (baseline numbness to left 5th finger), loss of control of bowels or bladder, or urinary retention. She reports nausea with "dry heaves" over the past several hours as well. Has not tried to get into see her PCP, family practice. Positive headache,  Negative vomiting, chest pain, shortness of breath, abdominal pain, diarrhea or constipation.   Arm Pain  This is a chronic problem. The current episode started today. The problem occurs constantly. The problem has been gradually worsening. Associated symptoms include myalgias, nausea, neck pain and numbness. Pertinent negatives include no abdominal pain, arthralgias, chest pain, congestion, coughing, diaphoresis, fatigue, fever, headaches, swollen glands, urinary symptoms, vomiting or weakness. The symptoms are aggravated by twisting. She has tried nothing for the symptoms. The treatment provided no relief. Patient was seen on 08/13/2013 in the [place] long ED where she was treated for same symptoms. Patient received Dilaudid and was discharged with a prescription for Valium which she has not filled.   Procedure MRI C-spine without contrast 07/22/2013 Cervical medullary  junction and visualized intracranial  structures unremarkable. No focal cervical cord signal  abnormality.  Both vertebral arteries are patent. Visualized paravertebral  structures unremarkable.  C2-3: Negative.  C3-4: Minimal right foraminal narrowing.  C4-5: Mild right foraminal narrowing.  C5-6: Minimal bulge to the left. No significant spinal stenosis.  C6-7: Negative.  C7-T1: Negative.      Review of Systems: The patient denies anorexia, fever, weight loss,, vision loss, decreased hearing, hoarseness, chest pain, syncope, dyspnea on exertion, peripheral edema, balance deficits, hemoptysis, abdominal pain, melena, hematochezia, severe indigestion/heartburn, hematuria, incontinence, genital sores, muscle weakness, suspicious skin lesions, transient blindness, difficulty walking, depression, unusual weight change, abnormal bleeding, enlarged lymph nodes, angioedema, and breast masses.    TRAVEL HISTORY:    Past Medical History  Diagnosis Date  . Anxiety   . GERD (gastroesophageal reflux disease)   . History of alcohol abuse     no alcohol since 2004  . Migraine headache   . History of cocaine abuse     none since 2011, entered treatment  . History of kidney stones   . Arthritis     left knee  . Depression   . Complication of anesthesia 03/2011    history of aspiration after anesthesia  . History of esophageal dilatation     multiple  . History of pancreatitis 2012  . Hypertension     under control with med., has been on med. x 3 yr.  . Sleep apnea     only uses CPAP machine 3 x/week  . Medial meniscus tear 07/2013    right  . Cervical muscle strain 07/2013    current Prednisone taper  . Dental crowns present     x 2  upper front  . Genital herpes simplex type 2    Past Surgical History  Procedure Laterality Date  . Abdominal hysterectomy      partial  . Cesarean section      x 2  . Nasal septum surgery    . Carpal tunnel release  07/29/2012    Procedure: CARPAL  TUNNEL RELEASE;  Surgeon: Eldred Manges, MD;  Location: [facility];  Service: Orthopedics;  Laterality: Right;  Right carpal tunnel release  . Laparoscopic appendectomy  02/21/2006  . Laparoscopic lysis of adhesions  02/21/2006  . Cholecystectomy  04/05/2010  . Ercp w/ metal stent placement  03/14/2011  . Cystoscopy w/ ureteral stent placement Right 06/11/2007  . Nasal septoplasty w/ turbinoplasty Bilateral 07/26/2009    bilat. turb. reduction  . Knee arthroscopy Left     x 3  . Knee arthroscopy with medial menisectomy Right 07/31/2013    Procedure: KNEE ARTHROSCOPY WITH PARTIAL LATERAL MENISECTOMY, CHONDROPLASTY;  Surgeon: Loreta Ave, MD;  Location:  SURGERY CENTER;  Service: Orthopedics;  Laterality: Right;   Social History:  reports that she quit smoking about 2 months ago. She has never used smokeless tobacco. She reports that she does not drink alcohol or use illicit drugs.   Allergies  Allergen Reactions  . Imitrex [Sumatriptan Base] Shortness Of Breath and Other (See Comments)    TACHYCARDIA  . Trazodone And Nefazodone Other (See Comments)    NIGHTMARES  . Effexor [Venlafaxine] Other (See Comments)    MAKES HER "JITTERY"  . Metoclopramide Hcl Itching  . Morphine And Related Itching  . Tegretol [Carbamazepine] Itching  . Topamax Itching    Family History  Problem Relation Age of Onset  . Heart disease Maternal Uncle   . Heart disease Maternal Grandmother   . Kidney cancer Mother   . Hypertension Mother   . Cancer Mother     renal  . Lung cancer Maternal Aunt   . Stroke Maternal Uncle 55  . Alcohol abuse Maternal Uncle      Prior to Admission medications   Medication Sig Start Date End Date Taking? Authorizing Provider  ALPRAZolam (XANAX XR) 0.5 MG 24 hr tablet Take 1.5 mg by mouth at bedtime.   Yes Historical Provider, MD  ALPRAZolam Prudy Feeler) 1 MG tablet Take 1 mg by mouth daily as needed for anxiety.   Yes Historical Provider, MD  amLODipine  (NORVASC) 10 MG tablet Take 10 mg by mouth every morning.   Yes Historical Provider, MD  amphetamine-dextroamphetamine (ADDERALL) 30 MG tablet Take 0.5 tablets (15 mg total) by mouth 3 (three) times daily. 06/26/13  Yes [name] S Jeffery, PA-C  cloNIDine (CATAPRES) 0.1 MG tablet Take 0.1 mg by mouth 3 (three) times daily.   Yes Historical Provider, MD  cyclobenzaprine (FLEXERIL) 10 MG tablet Take 10 mg by mouth 3 (three) times daily as needed for muscle spasms.   Yes Historical Provider, MD  EPINEPHrine (EPIPEN) 0.3 mg/0.3 mL DEVI Inject 0.3 mLs (0.3 mg total) into the muscle as needed (inject at time of signs of anaphylaxis). 06/04/12  Yes Tatyana A Kirichenko, PA-C  ibuprofen (ADVIL,MOTRIN) 200 MG tablet Take 600 mg by mouth 2 (two) times daily as needed for pain.   Yes Historical Provider, MD  ondansetron (ZOFRAN-ODT) 8 MG disintegrating tablet Take 8 mg by mouth every 8 (eight) hours as needed for nausea.   Yes Historical Provider, MD  oxyCODONE-acetaminophen (PERCOCET) 10-325 MG per tablet Take 1 tablet by mouth every  4 (four) hours as needed for pain. 08/05/13  Yes [name] S Jeffery, PA-C  pantoprazole (PROTONIX) 40 MG tablet Take 40 mg by mouth at bedtime.    Yes Historical Provider, MD  predniSONE (DELTASONE) 10 MG tablet Take 2 tablets (20 mg total) by mouth daily. 08/09/13  Yes Hilario Quarry, MD  sertraline (ZOLOFT) 100 MG tablet Take 100 mg by mouth at bedtime.   Yes Historical Provider, MD  diazepam (VALIUM) 5 MG tablet Take 1 tablet (5 mg total) by mouth every 6 (six) hours as needed (muscle spasm). 08/15/13   Izola Price. Marisue Humble, [place]   Physical Exam: Filed Vitals:   08/16/13 0411 08/16/13 0545 08/16/13 0559 08/16/13 0614  BP: 133/91   123/87  Pulse: 88 76 79 70  Temp: 98.2 F (36.8 C)     TempSrc: Oral     Resp: 20   16  SpO2: 100% 97% 98% 100%     General:  A/O. X4, moderate pain secondary to neck and shoulder pain left  Neck: Left cervical paraspinal muscles are in spasm which  limits mobility of head and neck  Cardiovascular: Regular rhythm and rate negative murmurs rubs gallops DP/PT pulse 2+ bilateral  Respiratory: Clear to auscultation bilateral  Musculoskeletal: C-spine paraspinal muscle spasms on the left, left testis torsion a muscle spasms from thoracic region up to neck including supraspinatus/infraspinatus muscles  Neurologic: Neurologically intact  Labs on Admission:  Basic Metabolic Panel:  Recent Labs Lab 08/16/13 0515  NA 139  K 3.6  CL 104  GLUCOSE 91  BUN 11  CREATININE 0.90   Liver Function Tests: No results found for this basename: AST, ALT, ALKPHOS, BILITOT, PROT, ALBUMIN,  in the last 168 hours No results found for this basename: LIPASE, AMYLASE,  in the last 168 hours No results found for this basename: AMMONIA,  in the last 168 hours CBC:  Recent Labs Lab 08/16/13 0509 08/16/13 0515  WBC 9.0  --   NEUTROABS 4.7  --   HGB 12.8 13.9  HCT 38.7 41.0  MCV 89.2  --   PLT 307  --    Cardiac Enzymes: No results found for this basename: CKTOTAL, CKMB, CKMBINDEX, TROPONINI,  in the last 168 hours  BNP (last 3 results) No results found for this basename: PROBNP,  in the last 8760 hours CBG: No results found for this basename: GLUCAP,  in the last 168 hours  Radiological Exams on Admission: No results found.  EKG:    Assessment/Plan Active Problems:   * No active hospital problems. *   1. Severe muscle spasm; patient was to be admitted for pain control secondary to severe muscle spasms of the left C-spine region left upper back region. Patient left AMA secondary to requesting only Dilaudid for treatment of her symptoms. Patient refused IV Robaxin as well as her home chronic pain medication regimen. 2. Drug-seeking behavior; see # 1  Time spent: 60 min  Debroah Shuttleworth, J [facility] Pager [phone]  If 7PM-7AM, please contact night-coverage [email or web address] Password [email or web address] 08/16/2013, 10:22 AM

## 2013-08-16 NOTE — ED Notes (Signed)
MD at bedside. 

## 2013-08-16 NOTE — ED Notes (Signed)
Per pt report: pt dx 2 months ago with a bulging disc. Pt seen earlier this evening and was discharged with a prescription for valium.  Pt's pain came back this evening. Pt also has percocet at home to take but that has not worked.  Pt has not filled the valium prescription.  Pt reports 1 episode of vomiting d/t to pain prior to arrival. Pt a/o x 4.  Skin warm and dry.

## 2013-08-16 NOTE — ED Notes (Signed)
Spoke with Dr Joseph Art regarding pts request to go home since she has home meds to take. He is in agreement for pt to leave AMA.

## 2013-08-16 NOTE — ED Provider Notes (Signed)
Medical screening examination/treatment/procedure(s) were conducted as a shared visit with non-physician practitioner(s) and myself.  I personally evaluated the patient during the encounter  Ongoing left-sided neck pain, severe with recent MRI followed by Ortho.  Evaluated here last night and improved with IV narcotics and discharged home. Returned for severe pain and vomiting unable to hold anything down. Pain symptoms unchanged. On exam has left-sided cervical tenderness without neuro deficits. min improvement with IV Dilaudid and IV Valium.  Discussed with family practice resident on call - they do not take contact with patient in transfer from [place]. 6:40 AM discussed with [facility] hospitalist Dr. Onalee Hua - hospitalist will evaluate patient bedside for admission.  Results for orders placed during the hospital encounter of 08/16/13  CBC WITH DIFFERENTIAL      Result Value Range   WBC 9.0  4.0 - 10.5 K/uL   RBC 4.34  3.87 - 5.11 MIL/uL   Hemoglobin 12.8  12.0 - 15.0 g/dL   HCT 72.5  36.6 - 44.0 %   MCV 89.2  78.0 - 100.0 fL   MCH 29.5  26.0 - 34.0 pg   MCHC 33.1  30.0 - 36.0 g/dL   RDW 34.7  42.5 - 95.6 %   Platelets 307  150 - 400 K/uL   Neutrophils Relative % 53  43 - 77 %   Neutro Abs 4.7  1.7 - 7.7 K/uL   Lymphocytes Relative 40  12 - 46 %   Lymphs Abs 3.6  0.7 - 4.0 K/uL   Monocytes Relative 5  3 - 12 %   Monocytes Absolute 0.5  0.1 - 1.0 K/uL   Eosinophils Relative 2  0 - 5 %   Eosinophils Absolute 0.2  0.0 - 0.7 K/uL   Basophils Relative 0  0 - 1 %   Basophils Absolute 0.0  0.0 - 0.1 K/uL  POCT I-STAT, CHEM 8      Result Value Range   Sodium 139  135 - 145 mEq/L   Potassium 3.6  3.5 - 5.1 mEq/L   Chloride 104  96 - 112 mEq/L   BUN 11  6 - 23 mg/dL   Creatinine, Ser 3.87  0.50 - 1.10 mg/dL   Glucose, Bld 91  70 - 99 mg/dL   Calcium, Ion 5.64 (*) 1.12 - 1.23 mmol/L   TCO2 23  0 - 100 mmol/L   Hemoglobin 13.9  12.0 - 15.0 g/dL   HCT 33.2  95.1 - 88.4 %   Ct Head Wo  Contrast  07/22/2013   CLINICAL DATA:  Neck pain, headache  EXAM: CT HEAD WITHOUT CONTRAST  TECHNIQUE: Contiguous axial images were obtained from the base of the skull through the vertex without intravenous contrast.  COMPARISON:  08/04/2009  FINDINGS: No skull fracture is noted. Paranasal sinuses and mastoid air cells are unremarkable. No intracranial hemorrhage, mass effect or midline shift.  No acute cortical infarction. No mass lesion is noted on this unenhanced scan. The gray and white-matter differentiation is preserved. No intra or extra-axial fluid collection.  IMPRESSION: No acute intracranial abnormality.   Electronically Signed   By: Natasha Mead   On: 07/22/2013 18:01   Mr Cervical Spine Wo Contrast  07/22/2013   *RADIOLOGY REPORT*  Clinical Data: Left-sided neck pain extending down left arm. Started 2 days ago bending over.  MRI CERVICAL SPINE WITHOUT CONTRAST  Technique:  Multiplanar and multiecho pulse sequences of the cervical spine, to include the craniocervical junction and cervicothoracic junction, were obtained  according to standard protocol without intravenous contrast.  Comparison: None.  Findings: Cervical medullary junction and visualized intracranial structures unremarkable.  No focal cervical cord signal abnormality.  Both vertebral arteries are patent.  Visualized paravertebral structures unremarkable.  C2-3:  Negative.  C3-4:  Minimal right foraminal narrowing.  C4-5:  Mild right foraminal narrowing.  C5-6:  Minimal bulge to the left.  No significant spinal stenosis.  C6-7:  Negative.  C7-T1:  Negative.  IMPRESSION: Minimal to mild degenerative changes as noted above without significant spinal stenosis.   Original Report Authenticated By: Lacy Duverney, M.D.      Sunnie Nielsen, MD 08/16/13 [id number]

## 2013-08-16 NOTE — ED Notes (Signed)
Charge RN spoke with pt, pt wants to leave ama, states she has pain medication at home she can use. Hospitalist paged by charge RN.

## 2013-08-16 NOTE — ED Provider Notes (Signed)
CSN: [id number]     Arrival date & time 08/16/13  0404 History   First MD Initiated Contact with Patient 08/16/13 0415     Chief Complaint  Patient presents with  . Neck Pain  . Arm Pain   HPI  History provided by the patient. The patient is a 42 year old female with history of hypertension, anxiety, depression, substance abuse, cervical radiculopathy and chronic pain who presents with continued chronic neck and left upper extremity pain as well as episode of vomiting. Patient was seen in the emergency department multiple times in the past 2 months for similar symptoms. She was seen yesterday almost 12 hours ago for similar symptoms. She was treated in emergency department with strong narcotic pain medications and antiemetics. She reports having some improvement but after returning home her symptoms returned after 2-3 hours. She did use her 10 mg oxycodone in her home Zofran without any relief of symptoms. She had one episode of vomiting at 1 AM. Her last dose of pain medicine and Zofran were at 1:30. She denies any new numbness or weakness in the extremity. Patient has an upcoming appointment with the spine and back specialists at [facility] orthopedics on the 20th. No other aggravating or alleviating factors. No other associated symptoms.    Past Medical History  Diagnosis Date  . Anxiety   . GERD (gastroesophageal reflux disease)   . History of alcohol abuse     no alcohol since 2004  . Migraine headache   . History of cocaine abuse     none since 2011, entered treatment  . History of kidney stones   . Arthritis     left knee  . Depression   . Complication of anesthesia 03/2011    history of aspiration after anesthesia  . History of esophageal dilatation     multiple  . History of pancreatitis 2012  . Hypertension     under control with med., has been on med. x 3 yr.  . Sleep apnea     only uses CPAP machine 3 x/week  . Medial meniscus tear 07/2013    right  . Cervical  muscle strain 07/2013    current Prednisone taper  . Dental crowns present     x 2 upper front  . Genital herpes simplex type 2    Past Surgical History  Procedure Laterality Date  . Abdominal hysterectomy      partial  . Cesarean section      x 2  . Nasal septum surgery    . Carpal tunnel release  07/29/2012    Procedure: CARPAL TUNNEL RELEASE;  Surgeon: Eldred Manges, MD;  Location: [facility];  Service: Orthopedics;  Laterality: Right;  Right carpal tunnel release  . Laparoscopic appendectomy  02/21/2006  . Laparoscopic lysis of adhesions  02/21/2006  . Cholecystectomy  04/05/2010  . Ercp w/ metal stent placement  03/14/2011  . Cystoscopy w/ ureteral stent placement Right 06/11/2007  . Nasal septoplasty w/ turbinoplasty Bilateral 07/26/2009    bilat. turb. reduction  . Knee arthroscopy Left     x 3  . Knee arthroscopy with medial menisectomy Right 07/31/2013    Procedure: KNEE ARTHROSCOPY WITH PARTIAL LATERAL MENISECTOMY, CHONDROPLASTY;  Surgeon: Loreta Ave, MD;  Location: [facility];  Service: Orthopedics;  Laterality: Right;   Family History  Problem Relation Age of Onset  . Heart disease Maternal Uncle   . Heart disease Maternal Grandmother   . Kidney cancer  Mother   . Hypertension Mother   . Cancer Mother     renal  . Lung cancer Maternal Aunt   . Stroke Maternal Uncle 55  . Alcohol abuse Maternal Uncle    History  Substance Use Topics  . Smoking status: Former Smoker    Quit date: 06/05/2013  . Smokeless tobacco: Never Used  . Alcohol Use: No     Comment: none since 2004   OB History   Grav Para Term Preterm Abortions TAB SAB Ect Mult Living                 Review of Systems  Constitutional: Negative for fever, chills and diaphoresis.  Gastrointestinal: Positive for nausea and vomiting.  Musculoskeletal: Positive for neck pain and neck stiffness. Negative for back pain.  Neurological: Positive for numbness. Negative for weakness.    All other systems reviewed and are negative.    Allergies  Imitrex; Trazodone and nefazodone; Effexor; Metoclopramide hcl; Morphine and related; Tegretol; and Topamax  Home Medications   Current Outpatient Rx  Name  Route  Sig  Dispense  Refill  . ALPRAZolam (XANAX XR) 0.5 MG 24 hr tablet   Oral   Take 1.5 mg by mouth at bedtime.         . ALPRAZolam (XANAX) 1 MG tablet   Oral   Take 1 mg by mouth daily as needed for anxiety.         Marland Kitchen amLODipine (NORVASC) 10 MG tablet   Oral   Take 10 mg by mouth every morning.         Marland Kitchen amphetamine-dextroamphetamine (ADDERALL) 30 MG tablet   Oral   Take 0.5 tablets (15 mg total) by mouth 3 (three) times daily.   45 tablet   0   . cloNIDine (CATAPRES) 0.1 MG tablet   Oral   Take 0.1 mg by mouth 3 (three) times daily.         . cyclobenzaprine (FLEXERIL) 10 MG tablet   Oral   Take 10 mg by mouth 3 (three) times daily as needed for muscle spasms.         Marland Kitchen EPINEPHrine (EPIPEN) 0.3 mg/0.3 mL DEVI   Intramuscular   Inject 0.3 mLs (0.3 mg total) into the muscle as needed (inject at time of signs of anaphylaxis).   2 Device   0   . ibuprofen (ADVIL,MOTRIN) 200 MG tablet   Oral   Take 600 mg by mouth 2 (two) times daily as needed for pain.         Marland Kitchen ondansetron (ZOFRAN-ODT) 8 MG disintegrating tablet   Oral   Take 8 mg by mouth every 8 (eight) hours as needed for nausea.         Marland Kitchen oxyCODONE-acetaminophen (PERCOCET) 10-325 MG per tablet   Oral   Take 1 tablet by mouth every 4 (four) hours as needed for pain.   90 tablet   0   . pantoprazole (PROTONIX) 40 MG tablet   Oral   Take 40 mg by mouth at bedtime.          . predniSONE (DELTASONE) 10 MG tablet   Oral   Take 2 tablets (20 mg total) by mouth daily.   15 tablet   0   . sertraline (ZOLOFT) 100 MG tablet   Oral   Take 100 mg by mouth at bedtime.         . diazepam (VALIUM) 5 MG tablet   Oral  Take 1 tablet (5 mg total) by mouth every 6 (six)  hours as needed (muscle spasm).   10 tablet   0    BP 133/91  Pulse 88  Temp(Src) 98.2 F (36.8 C) (Oral)  Resp 20  SpO2 100% Physical Exam  Nursing note and vitals reviewed. Constitutional: She is oriented to person, place, and time. She appears well-developed and well-nourished. No distress.  HENT:  Head: Normocephalic.  Neck:    No deformity of the cervical spine. There is tenderness around the C4-C6 area extending into left trapezius area. Pain is worsened with range of motion of the shoulder.  Cardiovascular: Normal rate, regular rhythm and intact distal pulses.   Pulmonary/Chest: Effort normal and breath sounds normal. No respiratory distress. She has no wheezes.  Musculoskeletal:  Patient reports some tenderness in the shoulder and upper extremity with palpation. No deformities. No swelling. Normal passive range of motion.  Neurological: She is alert and oriented to person, place, and time.  Reflex Scores:      Bicep reflexes are 2+ on the left side.      Brachioradialis reflexes are 2+ on the left side. Normal grip strength in left hand. There is normal and equal 2. discrimination to each fingertip and in the hand.  Skin: Skin is warm and dry. No rash noted.  Psychiatric: She has a normal mood and affect. Her behavior is normal.    ED Course  Procedures   COORDINATION OF CARE:  Nursing notes reviewed. Vital signs reviewed. Initial pt interview and examination performed.   4:52 AM-patient seen and evaluated. She appears uncomfortable but in no acute distress. She has no new or changed symptoms. Her outpatient medications for pain and nausea are not helping. At this time we'll give IV doses of medications and reassess.   Treatment plan initiated: Medications  HYDROmorphone (DILAUDID) injection 1 mg (1 mg Intravenous Given 08/16/13 0511)  ondansetron (ZOFRAN) injection 4 mg (4 mg Intravenous Given 08/16/13 0511)  diazepam (VALIUM) injection 5 mg (5 mg Intravenous  Given 08/16/13 0511)  HYDROmorphone (DILAUDID) injection 1 mg (1 mg Intravenous Given 08/16/13 0553)  ondansetron (ZOFRAN) injection 4 mg (4 mg Intravenous Given 08/16/13 0553)   Patient continues to complain of some pain and nausea despite initial medications. We'll give second dose. I discussed with patient options for admission percent met control given that she has had multiple recent visits to the emergency room including a visit less than 12 hours ago for her symptoms not controlled at home. she does feel she may feel better with treatment inpatient.  Pt discussed with Attending Physician.  Will plan to admit for symptomatic control given multiple ED visits and no relief with home meds.     MDM   1. Cervical radicular pain   2. Nausea & vomiting        Angus Seller, [place] 08/16/13 [phone]

## 2013-08-17 ENCOUNTER — Encounter (HOSPITAL_COMMUNITY): Payer: Self-pay | Admitting: Emergency Medicine

## 2013-08-17 ENCOUNTER — Emergency Department (HOSPITAL_COMMUNITY)
Admission: EM | Admit: 2013-08-17 | Discharge: 2013-08-17 | Disposition: A | Discharge status: Home / Self Care | Payer: 59 | Attending: Emergency Medicine | Admitting: Emergency Medicine

## 2013-08-17 DIAGNOSIS — IMO0002 Reserved for concepts with insufficient information to code with codable children: Secondary | ICD-10-CM | POA: Insufficient documentation

## 2013-08-17 DIAGNOSIS — M549 Dorsalgia, unspecified: Secondary | ICD-10-CM | POA: Insufficient documentation

## 2013-08-17 DIAGNOSIS — Z79899 Other long term (current) drug therapy: Secondary | ICD-10-CM | POA: Insufficient documentation

## 2013-08-17 DIAGNOSIS — F411 Generalized anxiety disorder: Secondary | ICD-10-CM | POA: Insufficient documentation

## 2013-08-17 DIAGNOSIS — F329 Major depressive disorder, single episode, unspecified: Secondary | ICD-10-CM | POA: Insufficient documentation

## 2013-08-17 DIAGNOSIS — Z9889 Other specified postprocedural states: Secondary | ICD-10-CM | POA: Insufficient documentation

## 2013-08-17 DIAGNOSIS — R112 Nausea with vomiting, unspecified: Secondary | ICD-10-CM | POA: Insufficient documentation

## 2013-08-17 DIAGNOSIS — M5412 Radiculopathy, cervical region: Secondary | ICD-10-CM | POA: Insufficient documentation

## 2013-08-17 DIAGNOSIS — K219 Gastro-esophageal reflux disease without esophagitis: Secondary | ICD-10-CM | POA: Insufficient documentation

## 2013-08-17 DIAGNOSIS — G43909 Migraine, unspecified, not intractable, without status migrainosus: Secondary | ICD-10-CM | POA: Insufficient documentation

## 2013-08-17 DIAGNOSIS — Z8619 Personal history of other infectious and parasitic diseases: Secondary | ICD-10-CM | POA: Insufficient documentation

## 2013-08-17 DIAGNOSIS — Z87442 Personal history of urinary calculi: Secondary | ICD-10-CM | POA: Insufficient documentation

## 2013-08-17 DIAGNOSIS — Z87891 Personal history of nicotine dependence: Secondary | ICD-10-CM | POA: Insufficient documentation

## 2013-08-17 DIAGNOSIS — I10 Essential (primary) hypertension: Secondary | ICD-10-CM | POA: Insufficient documentation

## 2013-08-17 DIAGNOSIS — F3289 Other specified depressive episodes: Secondary | ICD-10-CM | POA: Insufficient documentation

## 2013-08-17 DIAGNOSIS — R209 Unspecified disturbances of skin sensation: Secondary | ICD-10-CM | POA: Insufficient documentation

## 2013-08-17 DIAGNOSIS — M129 Arthropathy, unspecified: Secondary | ICD-10-CM | POA: Insufficient documentation

## 2013-08-17 MED ORDER — HYDROMORPHONE HCL PF 1 MG/ML IJ SOLN
1.0000 mg | Freq: Once | INTRAMUSCULAR | Status: AC
  Administered 2013-08-17: 1 mg via INTRAVENOUS
  Filled 2013-08-17: qty 1

## 2013-08-17 MED ORDER — HYDROMORPHONE HCL 4 MG PO TABS: 4.0000 mg | ORAL_TABLET | ORAL | Status: DC | PRN

## 2013-08-17 MED ORDER — PROMETHAZINE HCL 25 MG/ML IJ SOLN
12.5000 mg | Freq: Once | INTRAMUSCULAR | Status: AC
  Administered 2013-08-17: 12.5 mg via INTRAVENOUS
  Filled 2013-08-17: qty 1

## 2013-08-17 MED ORDER — DIAZEPAM 5 MG PO TABS
5.0000 mg | ORAL_TABLET | Freq: Once | ORAL | Status: AC
  Administered 2013-08-17: 5 mg via ORAL
  Filled 2013-08-17: qty 1

## 2013-08-17 MED ORDER — DIAZEPAM 5 MG PO TABS
10.0000 mg | ORAL_TABLET | Freq: Once | ORAL | Status: AC
  Administered 2013-08-17: 5 mg via ORAL
  Filled 2013-08-17: qty 2

## 2013-08-17 NOTE — ED Provider Notes (Signed)
CSN: [id number]     Arrival date & time 08/17/13  1639 History  This chart was scribed for Rhea Bleacher, PA, working with Geoffery Lyons, MD by Blanchard Kelch, ED Scribe. This patient was seen in room TR06C/TR06C and the patient's care was started at 5:15 PM.    Chief Complaint  Patient presents with  . Neck Pain    Patient is a 42 y.o. female presenting with neck pain. The history is provided by the patient. No language interpreter was used.  Neck Pain Associated symptoms: numbness (paresthesias of L upper extremity)   Associated symptoms: no fever and no weakness     HPI Comments: [name] is a 42 y.o. female who presents to the Emergency Department complaining of constant, severe left-sided neck pain that began about a month ago. The pain radiates down to her left shoulder. She describes the pain as shooting and sore. She was given an MRI of the area 07/22/13. She was seen in the ED, 10/10 and 10/11 and was recommended to be admitted but left AMA. She will likely be getting an injection in the disc in the future. She is back to the ED today because the pain is causing nausea and vomiting. She states that the combination of Phenergan, Valium and Dilaudid results in relief from the pain. She has not been getting relief from PO flexeril and percocet. Prednisone has not helped in the past. No lower extremity symptoms.   Past Medical History  Diagnosis Date  . Anxiety   . GERD (gastroesophageal reflux disease)   . History of alcohol abuse     no alcohol since 2004  . Migraine headache   . History of cocaine abuse     none since 2011, entered treatment  . History of kidney stones   . Arthritis     left knee  . Depression   . Complication of anesthesia 03/2011    history of aspiration after anesthesia  . History of esophageal dilatation     multiple  . History of pancreatitis 2012  . Hypertension     under control with med., has been on med. x 3 yr.  . Sleep apnea     only uses CPAP  machine 3 x/week  . Medial meniscus tear 07/2013    right  . Cervical muscle strain 07/2013    current Prednisone taper  . Dental crowns present     x 2 upper front  . Genital herpes simplex type 2    Past Surgical History  Procedure Laterality Date  . Abdominal hysterectomy      partial  . Cesarean section      x 2  . Nasal septum surgery    . Carpal tunnel release  07/29/2012    Procedure: CARPAL TUNNEL RELEASE;  Surgeon: Eldred Manges, MD;  Location: [facility];  Service: Orthopedics;  Laterality: Right;  Right carpal tunnel release  . Laparoscopic appendectomy  02/21/2006  . Laparoscopic lysis of adhesions  02/21/2006  . Cholecystectomy  04/05/2010  . Ercp w/ metal stent placement  03/14/2011  . Cystoscopy w/ ureteral stent placement Right 06/11/2007  . Nasal septoplasty w/ turbinoplasty Bilateral 07/26/2009    bilat. turb. reduction  . Knee arthroscopy Left     x 3  . Knee arthroscopy with medial menisectomy Right 07/31/2013    Procedure: KNEE ARTHROSCOPY WITH PARTIAL LATERAL MENISECTOMY, CHONDROPLASTY;  Surgeon: Loreta Ave, MD;  Location: [facility];  Service: Orthopedics;  Laterality: Right;   Family History  Problem Relation Age of Onset  . Heart disease Maternal Uncle   . Heart disease Maternal Grandmother   . Kidney cancer Mother   . Hypertension Mother   . Cancer Mother     renal  . Lung cancer Maternal Aunt   . Stroke Maternal Uncle 55  . Alcohol abuse Maternal Uncle    History  Substance Use Topics  . Smoking status: Former Smoker    Quit date: 06/05/2013  . Smokeless tobacco: Never Used  . Alcohol Use: No     Comment: none since 2004   OB History   Grav Para Term Preterm Abortions TAB SAB Ect Mult Living                 Review of Systems  Constitutional: Negative for fever and unexpected weight change.  Gastrointestinal: Positive for nausea and vomiting. Negative for constipation.       Negative for fecal incontinence.    Genitourinary: Negative for dysuria, hematuria, flank pain, vaginal bleeding, vaginal discharge and pelvic pain.       Negative for urinary incontinence or retention.  Musculoskeletal: Positive for back pain and neck pain.  Neurological: Positive for numbness (paresthesias of L upper extremity). Negative for weakness.       Denies saddle paresthesias.    Allergies  Imitrex; Trazodone and nefazodone; Effexor; Metoclopramide hcl; Morphine and related; Tegretol; and Topamax  Home Medications   Current Outpatient Rx  Name  Route  Sig  Dispense  Refill  . ALPRAZolam (XANAX XR) 0.5 MG 24 hr tablet   Oral   Take 1.5 mg by mouth at bedtime.         . ALPRAZolam (XANAX) 1 MG tablet   Oral   Take 1 mg by mouth daily as needed for anxiety.         Marland Kitchen amLODipine (NORVASC) 10 MG tablet   Oral   Take 10 mg by mouth every morning.         Marland Kitchen amphetamine-dextroamphetamine (ADDERALL) 30 MG tablet   Oral   Take 0.5 tablets (15 mg total) by mouth 3 (three) times daily.   45 tablet   0   . cloNIDine (CATAPRES) 0.1 MG tablet   Oral   Take 0.1 mg by mouth 3 (three) times daily.         . cyclobenzaprine (FLEXERIL) 10 MG tablet   Oral   Take 10 mg by mouth 3 (three) times daily as needed for muscle spasms.         . diazepam (VALIUM) 5 MG tablet   Oral   Take 1 tablet (5 mg total) by mouth every 6 (six) hours as needed (muscle spasm).   10 tablet   0   . EPINEPHrine (EPIPEN) 0.3 mg/0.3 mL DEVI   Intramuscular   Inject 0.3 mLs (0.3 mg total) into the muscle as needed (inject at time of signs of anaphylaxis).   2 Device   0   . ibuprofen (ADVIL,MOTRIN) 200 MG tablet   Oral   Take 600 mg by mouth 2 (two) times daily as needed for pain.         Marland Kitchen ondansetron (ZOFRAN-ODT) 8 MG disintegrating tablet   Oral   Take 8 mg by mouth every 8 (eight) hours as needed for nausea.         Marland Kitchen oxyCODONE-acetaminophen (PERCOCET) 10-325 MG per tablet   Oral   Take 1 tablet by mouth  every 4 (four) hours as needed for pain.   90 tablet   0   . pantoprazole (PROTONIX) 40 MG tablet   Oral   Take 40 mg by mouth at bedtime.          . predniSONE (DELTASONE) 10 MG tablet   Oral   Take 2 tablets (20 mg total) by mouth daily.   15 tablet   0   . sertraline (ZOLOFT) 100 MG tablet   Oral   Take 100 mg by mouth at bedtime.          Triage Vitals: BP 120/78  Pulse 93  Temp(Src) 97.8 F (36.6 C) (Oral)  Resp 18  Ht 5\' 7"  (1.702 m)  Wt 180 lb (81.647 kg)  BMI 28.19 kg/m2  SpO2 100%  Physical Exam  Nursing note and vitals reviewed. Constitutional: She appears well-developed and well-nourished.  HENT:  Head: Normocephalic and atraumatic.  Eyes: Conjunctivae are normal.  Neck: Normal range of motion. Neck supple.  Pulmonary/Chest: Effort normal.  Abdominal: Soft. There is no tenderness. There is no CVA tenderness.  Musculoskeletal:       Right shoulder: Normal.       Left shoulder: She exhibits decreased range of motion, tenderness and spasm. She exhibits no bony tenderness.       Right elbow: Normal.      Right wrist: Normal.       Cervical back: She exhibits tenderness and spasm. She exhibits normal range of motion and no bony tenderness.       Right upper arm: She exhibits tenderness. She exhibits no bony tenderness and no swelling.       Right forearm: Normal.       Right hand: She exhibits normal range of motion and no tenderness. Normal sensation noted. Normal strength noted.  No step-off noted with palpation of spine.   Neurological: She is alert. She has normal strength and normal reflexes. No sensory deficit.  5/5 strength in entire lower extremities bilaterally. No sensation deficit.   Skin: Skin is warm and dry. No rash noted.  Psychiatric: She has a normal mood and affect.    ED Course  Procedures (including critical care time)  DIAGNOSTIC STUDIES: Oxygen Saturation is 100% on room air, normal by my interpretation.    COORDINATION OF  CARE: 5:22 PM -Will review charts from recent ED visits to determine treatment plan. Patient verbalizes understanding and agrees with treatment plan.  Labs Review Labs Reviewed - No data to display Imaging Review Dg Thoracic Spine 2 View  08/16/2013   CLINICAL DATA:  Back pain, no trauma, evaluate for scoliosis  EXAM: THORACIC SPINE - 2 VIEW  COMPARISON:  None.  FINDINGS: There is 9 degrees convex left scoliotic curvature centered at the lower thoracic spine. There is normal anterior-posterior alignment. In the upper to mid thoracic spine there is mild to moderate degenerative disc disease at most levels.  IMPRESSION: Scoliosis with degenerative change.   Electronically Signed   By: Esperanza Heir M.D.   On: 08/16/2013 12:09    EKG Interpretation   None      Vital signs reviewed and are as follows: Filed Vitals:   08/17/13 1652  BP: 120/78  Pulse: 93  Temp: 97.8 F (36.6 C)  Resp: 18   Patient received IV dilaudid x2, PO valium with some relief. Will give pain medication for home. D/w patient to d/c oral percocet if taking PO dilaudid. Patient also to d/c flexeril if taking PO  valium.   Urged ortho f/u as soon as possible.   No red flag s/s of back pain. Patient was counseled on back pain precautions and told to do activity as tolerated but do not lift, push, or pull heavy objects more than 10 pounds for the next week.  Patient counseled to use ice or heat on back for no longer than 15 minutes every hour.   Patient prescribed muscle relaxer and counseled on proper use of muscle relaxant medication.    Patient prescribed narcotic pain medicine and counseled on proper use of narcotic pain medications. Counseled not to combine this medication with others containing tylenol.   Urged patient not to drink alcohol, drive, or perform any other activities that requires focus while taking either of these medications.  Patient urged to follow-up with PCP if pain does not improve with  treatment and rest or if pain becomes recurrent. Urged to return with worsening severe pain, loss of bowel or bladder control, trouble walking.   The patient verbalizes understanding and agrees with the plan.   MDM   1. Cervical radiculopathy    Patient with flare of cervical radicular symptoms. Pain improved in ED. No lower extremity symptoms suggestive of central cord syndrome or spinal stenosis. Switched patient's pain regimen and muscle relaxer in hopes of helping her manage pain at home. She has been feeling current regimen despite scheduled medication regimen.  I personally performed the services described in this documentation, which was scribed in my presence. The recorded information has been reviewed and is accurate.    Renne Crigler, PA-C 08/17/13 2122

## 2013-08-17 NOTE — ED Provider Notes (Signed)
Medical screening examination/treatment/procedure(s) were performed by non-physician practitioner and as supervising physician I was immediately available for consultation/collaboration.  Geoffery Lyons, MD 08/17/13 [phone]

## 2013-08-17 NOTE — ED Notes (Signed)
Pt reports going to [facility] the past two days for same. Reports having a bulging cervical disc and having severe pain and its radiating down left arm. No acute distress noted at triage.

## 2013-08-18 ENCOUNTER — Ambulatory Visit (INDEPENDENT_AMBULATORY_CARE_PROVIDER_SITE_OTHER): Payer: 59 | Admitting: Physician Assistant

## 2013-08-18 VITALS — BP 128/80 | HR 90 | Temp 98.7°F | Resp 16 | Ht 66.5 in | Wt 187.2 lb

## 2013-08-18 DIAGNOSIS — M25559 Pain in unspecified hip: Secondary | ICD-10-CM

## 2013-08-18 DIAGNOSIS — M25561 Pain in right knee: Secondary | ICD-10-CM

## 2013-08-18 DIAGNOSIS — M545 Low back pain, unspecified: Secondary | ICD-10-CM

## 2013-08-18 DIAGNOSIS — M5412 Radiculopathy, cervical region: Secondary | ICD-10-CM

## 2013-08-18 DIAGNOSIS — R110 Nausea: Secondary | ICD-10-CM

## 2013-08-18 DIAGNOSIS — M542 Cervicalgia: Secondary | ICD-10-CM

## 2013-08-18 MED ORDER — PROMETHAZINE HCL 25 MG PO TABS: 25.0000 mg | ORAL_TABLET | Freq: Four times a day (QID) | ORAL | Status: DC | PRN

## 2013-08-18 MED ORDER — DIAZEPAM 10 MG PO TABS: 10.0000 mg | ORAL_TABLET | Freq: Four times a day (QID) | ORAL | Status: DC | PRN

## 2013-08-18 MED ORDER — OXYCODONE-ACETAMINOPHEN 10-325 MG PO TABS: 1.0000 | ORAL_TABLET | ORAL | Status: DC | PRN

## 2013-08-18 NOTE — Patient Instructions (Signed)
Warm compresses. Use medication as directed.  When you exhaust your supply of Dilaudid, you may resume the percocet.

## 2013-08-18 NOTE — Progress Notes (Signed)
  Subjective:    Patient ID: [name], female    DOB: [birth year], 42 y.o.   MRN: [id number]  HPI This 42 y.o. female presents for evaluation of continued LEFT neck pain, radiating intothe back and LEFT arm.  Ongoing since 07/21/2013.  MRI 07/22/2013, revealing mild foraminal narrowing at C3-4 and C4-5, and mild disc bulge at C5-6, without spinal stenosis. 2 rounds of oral prednisone without benefit.  Has been to the ED x 4 since the MRI with persistent symptoms. Last night, she was to be admitted for pain control, but she left AMA after learning that the plan was only to provide her home pain regimen.  The PA in the ED gave her dilaudid to take home, and she had some Valium 5 mg to take (though she notes that the valium isn't really helping much). Would like to continue this regimen until her appointment with Dr. Maurice Small.  Consultation 10/20 with Dr. Romero Belling at [facility] to consider neck injection.  Trying to get the appointment moved up due to her pain.  4 days without sleeping. Took her mother's clonazepam (took 3 mg) with results.Wants to change from alprazolam to clonazepam.  Medications, allergies, past medical history, surgical history, family history, social history and problem list reviewed.  Review of Systems As above. Will need refill of Percocet in a few days when her supply of Dilaudid is exhausted. Zofran is not controlling her nausea, and she requests going back to phenergan.    Objective:   Physical Exam Blood pressure 128/80, pulse 90, temperature 98.7 F (37.1 C), temperature source Oral, resp. rate 16, height 5' 6.5" (1.689 m), weight 187 lb 3.2 oz (84.913 kg), SpO2 100.00%. Body mass index is 29.77 kg/(m^2). Well-developed, well nourished WF who is awake, alert and oriented, in NAD. HEENT: [place]/AT, sclera and conjunctiva are clear.   Neck: supple, non-tender, no lymphadenopathy, thyromegaly. Posteriorly, there is tenderness along the LEFT paraspinous muscles and  along the scapula on the LEFT.  Mild spasm palpable. Heart: RRR, no murmur Lungs: normal effort, CTA. Skin: warm and dry without rash. Psychologic: good mood and appropriate affect, normal speech and behavior.        Assessment & Plan:  Cervical radiculopathy /Neck pain on left side - Plan: diazepam (VALIUM) 10 MG tablet.  I do not prescribe dilaudid. She'll complete the supply of dilaudid she has, and then change back to percocet. Proceed with consultation with Dr. Maurice Small.  Knee pain, right/Lumbar back pain - Plan: oxyCODONE-acetaminophen (PERCOCET) 10-325 MG per tablet  Nausea alone - Plan: promethazine (PHENERGAN) 25 MG tablet  Anxiety/insomnia - once she no longer needs the valium for neck spasm, I'm happy to switch her from alprazolam to clonazepam.  [name], PA-C Physician Assistant-Certified [facility]

## 2013-08-23 ENCOUNTER — Emergency Department (HOSPITAL_COMMUNITY)
Admission: EM | Admit: 2013-08-23 | Discharge: 2013-08-23 | Disposition: A | Discharge status: Home / Self Care | Payer: 59 | Attending: Emergency Medicine | Admitting: Emergency Medicine

## 2013-08-23 ENCOUNTER — Encounter (HOSPITAL_COMMUNITY): Payer: Self-pay | Admitting: Emergency Medicine

## 2013-08-23 DIAGNOSIS — Z87442 Personal history of urinary calculi: Secondary | ICD-10-CM | POA: Insufficient documentation

## 2013-08-23 DIAGNOSIS — Z8619 Personal history of other infectious and parasitic diseases: Secondary | ICD-10-CM | POA: Insufficient documentation

## 2013-08-23 DIAGNOSIS — F329 Major depressive disorder, single episode, unspecified: Secondary | ICD-10-CM | POA: Insufficient documentation

## 2013-08-23 DIAGNOSIS — Z98811 Dental restoration status: Secondary | ICD-10-CM | POA: Insufficient documentation

## 2013-08-23 DIAGNOSIS — Z9889 Other specified postprocedural states: Secondary | ICD-10-CM | POA: Insufficient documentation

## 2013-08-23 DIAGNOSIS — M542 Cervicalgia: Secondary | ICD-10-CM | POA: Insufficient documentation

## 2013-08-23 DIAGNOSIS — R110 Nausea: Secondary | ICD-10-CM | POA: Insufficient documentation

## 2013-08-23 DIAGNOSIS — F411 Generalized anxiety disorder: Secondary | ICD-10-CM | POA: Insufficient documentation

## 2013-08-23 DIAGNOSIS — F1021 Alcohol dependence, in remission: Secondary | ICD-10-CM | POA: Insufficient documentation

## 2013-08-23 DIAGNOSIS — IMO0001 Reserved for inherently not codable concepts without codable children: Secondary | ICD-10-CM | POA: Insufficient documentation

## 2013-08-23 DIAGNOSIS — K219 Gastro-esophageal reflux disease without esophagitis: Secondary | ICD-10-CM | POA: Insufficient documentation

## 2013-08-23 DIAGNOSIS — Z87891 Personal history of nicotine dependence: Secondary | ICD-10-CM | POA: Insufficient documentation

## 2013-08-23 DIAGNOSIS — F3289 Other specified depressive episodes: Secondary | ICD-10-CM | POA: Insufficient documentation

## 2013-08-23 DIAGNOSIS — I10 Essential (primary) hypertension: Secondary | ICD-10-CM | POA: Insufficient documentation

## 2013-08-23 DIAGNOSIS — M1710 Unilateral primary osteoarthritis, unspecified knee: Secondary | ICD-10-CM | POA: Insufficient documentation

## 2013-08-23 DIAGNOSIS — G8929 Other chronic pain: Secondary | ICD-10-CM | POA: Insufficient documentation

## 2013-08-23 DIAGNOSIS — Z79899 Other long term (current) drug therapy: Secondary | ICD-10-CM | POA: Insufficient documentation

## 2013-08-23 DIAGNOSIS — Z87828 Personal history of other (healed) physical injury and trauma: Secondary | ICD-10-CM | POA: Insufficient documentation

## 2013-08-23 DIAGNOSIS — M25519 Pain in unspecified shoulder: Secondary | ICD-10-CM | POA: Insufficient documentation

## 2013-08-23 DIAGNOSIS — G4730 Sleep apnea, unspecified: Secondary | ICD-10-CM | POA: Insufficient documentation

## 2013-08-23 MED ORDER — ONDANSETRON 4 MG PO TBDP
4.0000 mg | ORAL_TABLET | Freq: Once | ORAL | Status: AC
  Administered 2013-08-23: 4 mg via ORAL
  Filled 2013-08-23: qty 1

## 2013-08-23 MED ORDER — HYDROMORPHONE HCL PF 2 MG/ML IJ SOLN
2.0000 mg | Freq: Once | INTRAMUSCULAR | Status: AC
  Administered 2013-08-23: 2 mg via INTRAVENOUS
  Filled 2013-08-23: qty 1

## 2013-08-23 MED ORDER — HYDROMORPHONE HCL 2 MG PO TABS
2.0000 mg | ORAL_TABLET | Freq: Once | ORAL | Status: AC
  Administered 2013-08-23: 2 mg via ORAL
  Filled 2013-08-23: qty 1

## 2013-08-23 MED ORDER — PROMETHAZINE HCL 25 MG/ML IJ SOLN
25.0000 mg | Freq: Once | INTRAMUSCULAR | Status: AC
  Administered 2013-08-23: 25 mg via INTRAVENOUS
  Filled 2013-08-23: qty 1

## 2013-08-23 NOTE — ED Provider Notes (Signed)
  This was a shared visit with a mid-level provided (NP or PA).  Throughout the patient's course I was available for consultation/collaboration.  On my exam the patient was in no distress.  I had a lengthy conversation with her regarding her chronic neck pain, the need for further evaluation, with her specialists, which she is performing.  Patient has narcotics at home.  I discussed with her the need for future narcotic prescriptions to come from one provider, as well as our desire to alleviate her pain while here in the emergency department.  Absent distress, evidence of neurologic compromise, she was appropriate for discharge with her outpatient evaluation, which was previously scheduled (within 48 hours).      Gerhard Munch, MD 08/23/13 [phone]

## 2013-08-23 NOTE — ED Notes (Signed)
Pt states that she has a hx of bulging disc in her neck. Pt states the pain radiates down her arm and makes her feel nauseated.

## 2013-08-23 NOTE — ED Provider Notes (Signed)
CSN: [id number]     Arrival date & time 08/23/13  1823 History   This chart was scribed for non-physician practitioner Dierdre Forth, PA-C working with Gerhard Munch, MD by Clydene Laming, ED Scribe. This patient was seen in room TR09C/TR09C and the patient's care was started at 6:59 PM.   Chief Complaint  Patient presents with  . Neck Pain    The history is provided by the patient. No language interpreter was used.   HPI Comments: [name] is a 42 y.o. female who presents to the Emergency Department complaining of gradual, persistent, gradually worsening neck pain onset two weeks ago with associated nausea. Patient's partner reports that this has been ongoing for several years but this particular exacerbation worsened 2 weeks ago. Pt reports the pain is radiating down her left arm and worsening. Pt was seen in the ED recently for similar symptoms and had some relief with dilaudid. She requests more of this medication today. Pt denies any fall or injury. Pt is waiting on her appointment to receive an injection in her neck.  Nothing makes it better and turning her head and palpation of the left side of her neck makes the pain worse.  She has been taking Valium and Percocet at home without relief. She's also been taking Dilaudid at home with some relief but states she is out of this medication now.  She takes Phenergan at home with moderate relief of her nausea.  Past Medical History  Diagnosis Date  . Anxiety   . GERD (gastroesophageal reflux disease)   . History of alcohol abuse     no alcohol since 2004  . Migraine headache   . History of cocaine abuse     none since 2011, entered treatment  . History of kidney stones   . Arthritis     left knee  . Depression   . Complication of anesthesia 03/2011    history of aspiration after anesthesia  . History of esophageal dilatation     multiple  . History of pancreatitis 2012  . Hypertension     under control with med., has been on  med. x 3 yr.  . Sleep apnea     only uses CPAP machine 3 x/week  . Medial meniscus tear 07/2013    right  . Cervical muscle strain 07/2013    current Prednisone taper  . Dental crowns present     x 2 upper front  . Genital herpes simplex type 2    Past Surgical History  Procedure Laterality Date  . Abdominal hysterectomy      partial  . Cesarean section      x 2  . Nasal septum surgery    . Carpal tunnel release  07/29/2012    Procedure: CARPAL TUNNEL RELEASE;  Surgeon: Eldred Manges, MD;  Location: [facility];  Service: Orthopedics;  Laterality: Right;  Right carpal tunnel release  . Laparoscopic appendectomy  02/21/2006  . Laparoscopic lysis of adhesions  02/21/2006  . Cholecystectomy  04/05/2010  . Ercp w/ metal stent placement  03/14/2011  . Cystoscopy w/ ureteral stent placement Right 06/11/2007  . Nasal septoplasty w/ turbinoplasty Bilateral 07/26/2009    bilat. turb. reduction  . Knee arthroscopy Left     x 3  . Knee arthroscopy with medial menisectomy Right 07/31/2013    Procedure: KNEE ARTHROSCOPY WITH PARTIAL LATERAL MENISECTOMY, CHONDROPLASTY;  Surgeon: Loreta Ave, MD;  Location: [facility];  Service: Orthopedics;  Laterality: Right;   Family History  Problem Relation Age of Onset  . Heart disease Maternal Uncle   . Heart disease Maternal Grandmother   . Kidney cancer Mother   . Hypertension Mother   . Cancer Mother     renal  . Lung cancer Maternal Aunt   . Stroke Maternal Uncle 55  . Alcohol abuse Maternal Uncle    History  Substance Use Topics  . Smoking status: Former Smoker    Quit date: 06/05/2013  . Smokeless tobacco: Never Used  . Alcohol Use: No     Comment: none since 2004   OB History   Grav Para Term Preterm Abortions TAB SAB Ect Mult Living                 Review of Systems  Constitutional: Negative for fever and fatigue.  Respiratory: Negative for chest tightness and shortness of breath.   Cardiovascular:  Negative for chest pain.  Gastrointestinal: Negative for nausea, vomiting, abdominal pain and diarrhea.  Genitourinary: Negative for dysuria, urgency, frequency and hematuria.  Musculoskeletal: Positive for arthralgias, myalgias and neck pain. Negative for back pain, gait problem, joint swelling and neck stiffness.  Skin: Negative for rash.  Neurological: Negative for weakness, light-headedness, numbness and headaches.  All other systems reviewed and are negative.    Allergies  Imitrex; Trazodone and nefazodone; Effexor; Metoclopramide hcl; Morphine and related; Tegretol; and Topamax  Home Medications   Current Outpatient Rx  Name  Route  Sig  Dispense  Refill  . amLODipine (NORVASC) 10 MG tablet   Oral   Take 10 mg by mouth every morning.         Marland Kitchen amphetamine-dextroamphetamine (ADDERALL) 30 MG tablet   Oral   Take 15 mg by mouth 3 (three) times daily.         . cloNIDine (CATAPRES) 0.1 MG tablet   Oral   Take 0.1 mg by mouth 3 (three) times daily.         . diazepam (VALIUM) 10 MG tablet   Oral   Take 10 mg by mouth every 6 (six) hours as needed for anxiety.         Marland Kitchen EPINEPHrine (EPIPEN) 0.3 mg/0.3 mL SOAJ injection   Intramuscular   Inject 0.3 mg into the muscle daily as needed (Anaphylaxis).         Marland Kitchen HYDROmorphone (DILAUDID) 4 MG tablet   Oral   Take 4 mg by mouth every 6 (six) hours as needed for pain.         Marland Kitchen ibuprofen (ADVIL,MOTRIN) 200 MG tablet   Oral   Take 600 mg by mouth 2 (two) times daily as needed for pain.         . pantoprazole (PROTONIX) 40 MG tablet   Oral   Take 40 mg by mouth at bedtime.          . promethazine (PHENERGAN) 25 MG tablet   Oral   Take 25 mg by mouth every 6 (six) hours as needed for nausea.         . sertraline (ZOLOFT) 100 MG tablet   Oral   Take 100 mg by mouth at bedtime.          Triage Vitals: BP 135/94  Pulse 93  Temp(Src) 98 F (36.7 C) (Oral)  Resp 20  SpO2 100% Physical Exam  Nursing  note and vitals reviewed. Constitutional: She is oriented to person, place, and time. She appears well-developed and well-nourished.  No distress.  HENT:  Head: Normocephalic and atraumatic.  Mouth/Throat: Oropharynx is clear and moist. No oropharyngeal exudate.  Eyes: Conjunctivae are normal.  Neck: Normal range of motion. Neck supple.  Decreased ROM with pain Left sided paraspinal tenderness No midline tenderness Palpation of the left paraspinal muscles reproduces pain in the left arm   Cardiovascular: Normal rate, regular rhythm, normal heart sounds and intact distal pulses.   No murmur heard. Pulmonary/Chest: Effort normal and breath sounds normal. No respiratory distress. She has no wheezes.  Abdominal: Soft. She exhibits no distension. There is no tenderness.  Musculoskeletal:  Full range of motion of the T-spine and L-spine No tenderness to palpation of the spinous processes of the T-spine or L-spine No tenderness to palpation of the paraspinous muscles of the L-spine  Lymphadenopathy:    She has no cervical adenopathy.  Neurological: She is alert and oriented to person, place, and time. She has normal reflexes. She exhibits normal muscle tone. Coordination normal.  Speech is clear and goal oriented, follows commands Normal strength in upper and lower extremities bilaterally including dorsiflexion and plantar flexion, strong and equal grip strength Sensation normal to light and sharp touch Moves extremities without ataxia, coordination intact Normal gait Normal balance   Skin: Skin is warm and dry. No rash noted. She is not diaphoretic. No erythema.  Psychiatric: Her speech is normal.  Flat affect    ED Course  Procedures (including critical care time) DIAGNOSTIC STUDIES: Oxygen Saturation is 100% on RA, normal by my interpretation.    COORDINATION OF CARE: 7:05 PM- Discussed treatment plan with pt at bedside. Pt verbalized understanding and agreement with plan.    Labs Review Labs Reviewed - No data to display Imaging Review No results found.  EKG Interpretation   None       MDM   1. Chronic neck pain      [name] presents for acute on chronic neck and left arm pain that is radiating down her left arm. There has been no recent trauma and imaging is not indicated at this time. Will control pain and nausea here.   Record review shows that this has been ongoing since 07/21/2013. Her MRI on 07/22/2013, reveaed mild foraminal narrowing at C3-4 and C4-5, and mild disc bulge at C5-6, without spinal stenosis. She has attempted 2 rounds of oral prednisone without benefit. Has been to the ED x 4 since the MRI with persistent symptoms. On 10/10 and 10/11 she was offered admission for pain control but left AMA. She was seen again on 08/17/2014 after which she was discharged with Dilaudid and Valium by mouth.  She reports she is out of this medication at this time.  She is scheduled for a consultation on 10/20 with Dr. Romero Belling at [facility] to consider neck injection.   Pt has been previously evaluated by their PCP and is currently taking Percocet and Valium.  No evidence of cervical nerve root compression on physical exam. Discussed with patient at length at the emergency department Will no longer continue to control her chronic pain. Specifically I will not write for Dilaudid today.  DC w conservative home therapies (ie heat), advice to cont meds per PCP.  Pt also advised to followup with orthopedics as planned.    Patient was discussed with and seen by Dr. Gerhard Munch who agrees with the plan to discharge home without further outpatient pain control.  It has been determined that no acute conditions requiring further emergency  intervention are present at this time. The patient/guardian have been advised of the diagnosis and plan. We have discussed signs and symptoms that warrant return to the ED, such as changes or worsening in symptoms.    Vital signs are stable at discharge.   BP 113/81  Pulse 97  Temp(Src) 98 F (36.7 C) (Oral)  Resp 18  SpO2 95%  Patient/guardian has voiced understanding and agreed to follow-up with the PCP or specialist.          Dierdre Forth, PA-C 08/23/13 2257

## 2013-08-23 NOTE — ED Notes (Signed)
Pt c/o neck pain x 2 weeks with radiation down right arm; pt sts some nausea due to pain; pt seen here recently for same

## 2013-08-24 ENCOUNTER — Encounter (HOSPITAL_COMMUNITY): Payer: Self-pay | Admitting: Emergency Medicine

## 2013-08-24 ENCOUNTER — Emergency Department (HOSPITAL_COMMUNITY)
Admission: EM | Admit: 2013-08-24 | Discharge: 2013-08-24 | Disposition: A | Discharge status: Home / Self Care | Payer: 59 | Attending: Emergency Medicine | Admitting: Emergency Medicine

## 2013-08-24 DIAGNOSIS — I10 Essential (primary) hypertension: Secondary | ICD-10-CM | POA: Insufficient documentation

## 2013-08-24 DIAGNOSIS — K219 Gastro-esophageal reflux disease without esophagitis: Secondary | ICD-10-CM | POA: Insufficient documentation

## 2013-08-24 DIAGNOSIS — G4730 Sleep apnea, unspecified: Secondary | ICD-10-CM | POA: Insufficient documentation

## 2013-08-24 DIAGNOSIS — Z98811 Dental restoration status: Secondary | ICD-10-CM | POA: Insufficient documentation

## 2013-08-24 DIAGNOSIS — Z87891 Personal history of nicotine dependence: Secondary | ICD-10-CM | POA: Insufficient documentation

## 2013-08-24 DIAGNOSIS — Z79899 Other long term (current) drug therapy: Secondary | ICD-10-CM | POA: Insufficient documentation

## 2013-08-24 DIAGNOSIS — F329 Major depressive disorder, single episode, unspecified: Secondary | ICD-10-CM | POA: Insufficient documentation

## 2013-08-24 DIAGNOSIS — M5412 Radiculopathy, cervical region: Secondary | ICD-10-CM | POA: Insufficient documentation

## 2013-08-24 DIAGNOSIS — G43909 Migraine, unspecified, not intractable, without status migrainosus: Secondary | ICD-10-CM | POA: Insufficient documentation

## 2013-08-24 DIAGNOSIS — M1710 Unilateral primary osteoarthritis, unspecified knee: Secondary | ICD-10-CM | POA: Insufficient documentation

## 2013-08-24 DIAGNOSIS — Z8619 Personal history of other infectious and parasitic diseases: Secondary | ICD-10-CM | POA: Insufficient documentation

## 2013-08-24 DIAGNOSIS — Z87442 Personal history of urinary calculi: Secondary | ICD-10-CM | POA: Insufficient documentation

## 2013-08-24 DIAGNOSIS — F3289 Other specified depressive episodes: Secondary | ICD-10-CM | POA: Insufficient documentation

## 2013-08-24 DIAGNOSIS — R209 Unspecified disturbances of skin sensation: Secondary | ICD-10-CM | POA: Insufficient documentation

## 2013-08-24 DIAGNOSIS — Z9981 Dependence on supplemental oxygen: Secondary | ICD-10-CM | POA: Insufficient documentation

## 2013-08-24 DIAGNOSIS — Z87828 Personal history of other (healed) physical injury and trauma: Secondary | ICD-10-CM | POA: Insufficient documentation

## 2013-08-24 DIAGNOSIS — F411 Generalized anxiety disorder: Secondary | ICD-10-CM | POA: Insufficient documentation

## 2013-08-24 MED ORDER — HYDROMORPHONE HCL PF 1 MG/ML IJ SOLN: 1.0000 mg | Freq: Once | INTRAMUSCULAR | Status: DC

## 2013-08-24 MED ORDER — DIAZEPAM 5 MG/ML IJ SOLN: 5.0000 mg | Freq: Once | INTRAMUSCULAR | Status: DC

## 2013-08-24 MED ORDER — ONDANSETRON HCL 4 MG/2ML IJ SOLN
INTRAMUSCULAR | Status: AC
  Administered 2013-08-24: 4 mg
  Filled 2013-08-24: qty 2

## 2013-08-24 MED ORDER — DIAZEPAM 5 MG PO TABS
5.0000 mg | ORAL_TABLET | Freq: Once | ORAL | Status: AC
  Administered 2013-08-24: 5 mg via ORAL
  Filled 2013-08-24: qty 1

## 2013-08-24 MED ORDER — ONDANSETRON HCL 4 MG/2ML IJ SOLN: 4.0000 mg | Freq: Once | INTRAMUSCULAR | Status: DC

## 2013-08-24 MED ORDER — HYDROMORPHONE HCL PF 1 MG/ML IJ SOLN
1.0000 mg | Freq: Once | INTRAMUSCULAR | Status: AC
  Administered 2013-08-24: 1 mg via INTRAVENOUS
  Filled 2013-08-24: qty 1

## 2013-08-24 MED ORDER — KETOROLAC TROMETHAMINE 30 MG/ML IJ SOLN: 30.0000 mg | Freq: Once | INTRAMUSCULAR | Status: DC

## 2013-08-24 MED ORDER — HYDROMORPHONE HCL PF 1 MG/ML IJ SOLN
1.0000 mg | Freq: Once | INTRAMUSCULAR | Status: AC
  Administered 2013-08-24: 1 mg via INTRAMUSCULAR
  Filled 2013-08-24: qty 1

## 2013-08-24 MED ORDER — SODIUM CHLORIDE 0.9 % IV BOLUS (SEPSIS): 500.0000 mL | Freq: Once | INTRAVENOUS | Status: DC

## 2013-08-24 MED ORDER — HYDROMORPHONE HCL 2 MG PO TABS: 2.0000 mg | ORAL_TABLET | ORAL | Status: DC | PRN

## 2013-08-24 MED ORDER — IBUPROFEN 200 MG PO TABS
600.0000 mg | ORAL_TABLET | Freq: Once | ORAL | Status: AC
  Administered 2013-08-24: 600 mg via ORAL
  Filled 2013-08-24: qty 3

## 2013-08-24 NOTE — ED Notes (Signed)
Attempted to start PIV, but unsuccessful. 2 sticks attempted. Pt tolerated IV sticks.

## 2013-08-24 NOTE — ED Notes (Signed)
Pt complaining of stabbing shooting pain in left side of neck radiating down left arm. Also reports some nausea with pain. Reports has a bulging disk in lower back at c5 and c6. Reports having recurrent pain in same location since September 15 or 16. Is being followed by primary MD for pain. Vwilliams,rn.

## 2013-08-24 NOTE — ED Provider Notes (Addendum)
CSN: [id number]     Arrival date & time 08/24/13  1118 History   First MD Initiated Contact with Patient 08/24/13 1131     Chief Complaint  Patient presents with  . Shoulder Pain    pain in left side of neck radiating down left arm   (Consider location/radiation/quality/duration/timing/severity/associated sxs/prior Treatment) HPI  42 year old female with left neck pain. Radiates down into her left hand. Patient has a history of cervical radiculopathy and states that current symptoms feel similar. She also has numbness in her little finger but this is chronic as well. Denies any acute trauma. No fevers or chills. Has appointment with orthopedics tomorrow for further evaluation and possible injection. She has been taking pain medication at home with minimal relief.  Past Medical History  Diagnosis Date  . Anxiety   . GERD (gastroesophageal reflux disease)   . History of alcohol abuse     no alcohol since 2004  . Migraine headache   . History of cocaine abuse     none since 2011, entered treatment  . History of kidney stones   . Arthritis     left knee  . Depression   . Complication of anesthesia 03/2011    history of aspiration after anesthesia  . History of esophageal dilatation     multiple  . History of pancreatitis 2012  . Hypertension     under control with med., has been on med. x 3 yr.  . Sleep apnea     only uses CPAP machine 3 x/week  . Medial meniscus tear 07/2013    right  . Cervical muscle strain 07/2013    current Prednisone taper  . Dental crowns present     x 2 upper front  . Genital herpes simplex type 2    Past Surgical History  Procedure Laterality Date  . Abdominal hysterectomy      partial  . Cesarean section      x 2  . Nasal septum surgery    . Carpal tunnel release  07/29/2012    Procedure: CARPAL TUNNEL RELEASE;  Surgeon: Eldred Manges, MD;  Location: [facility];  Service: Orthopedics;  Laterality: Right;  Right carpal tunnel release   . Laparoscopic appendectomy  02/21/2006  . Laparoscopic lysis of adhesions  02/21/2006  . Cholecystectomy  04/05/2010  . Ercp w/ metal stent placement  03/14/2011  . Cystoscopy w/ ureteral stent placement Right 06/11/2007  . Nasal septoplasty w/ turbinoplasty Bilateral 07/26/2009    bilat. turb. reduction  . Knee arthroscopy Left     x 3  . Knee arthroscopy with medial menisectomy Right 07/31/2013    Procedure: KNEE ARTHROSCOPY WITH PARTIAL LATERAL MENISECTOMY, CHONDROPLASTY;  Surgeon: Loreta Ave, MD;  Location: [facility];  Service: Orthopedics;  Laterality: Right;   Family History  Problem Relation Age of Onset  . Heart disease Maternal Uncle   . Heart disease Maternal Grandmother   . Kidney cancer Mother   . Hypertension Mother   . Cancer Mother     renal  . Lung cancer Maternal Aunt   . Stroke Maternal Uncle 55  . Alcohol abuse Maternal Uncle    History  Substance Use Topics  . Smoking status: Former Smoker    Quit date: 06/05/2013  . Smokeless tobacco: Never Used  . Alcohol Use: No     Comment: none since 2004   OB History   Grav Para Term Preterm Abortions TAB SAB Ect Mult Living  Review of Systems  All systems reviewed and negative, other than as noted in HPI.   Allergies  Imitrex; Trazodone and nefazodone; Effexor; Metoclopramide hcl; Morphine and related; Tegretol; and Topamax  Home Medications   Current Outpatient Rx  Name  Route  Sig  Dispense  Refill  . amLODipine (NORVASC) 10 MG tablet   Oral   Take 10 mg by mouth every morning.         Marland Kitchen amphetamine-dextroamphetamine (ADDERALL) 30 MG tablet   Oral   Take 15 mg by mouth 3 (three) times daily.         . cloNIDine (CATAPRES) 0.1 MG tablet   Oral   Take 0.1 mg by mouth 3 (three) times daily.         . diazepam (VALIUM) 10 MG tablet   Oral   Take 10 mg by mouth every 6 (six) hours as needed for anxiety.         Marland Kitchen EPINEPHrine (EPIPEN) 0.3 mg/0.3 mL SOAJ  injection   Intramuscular   Inject 0.3 mg into the muscle daily as needed (Anaphylaxis).         Marland Kitchen HYDROmorphone (DILAUDID) 4 MG tablet   Oral   Take 4 mg by mouth every 6 (six) hours as needed for pain.         Marland Kitchen ibuprofen (ADVIL,MOTRIN) 200 MG tablet   Oral   Take 600 mg by mouth 2 (two) times daily as needed for pain.         . pantoprazole (PROTONIX) 40 MG tablet   Oral   Take 40 mg by mouth at bedtime.          . promethazine (PHENERGAN) 25 MG tablet   Oral   Take 25 mg by mouth every 6 (six) hours as needed for nausea.         . sertraline (ZOLOFT) 100 MG tablet   Oral   Take 100 mg by mouth at bedtime.         Marland Kitchen HYDROmorphone (DILAUDID) 2 MG tablet   Oral   Take 1 tablet (2 mg total) by mouth every 4 (four) hours as needed for pain.   10 tablet   0    BP 117/78  Pulse 82  Temp(Src) 98.7 F (37.1 C) (Oral)  Resp 16  SpO2 99% Physical Exam  Nursing note and vitals reviewed. Constitutional: She appears well-developed and well-nourished. No distress.  HENT:  Head: Normocephalic and atraumatic.  Eyes: Conjunctivae are normal. Right eye exhibits no discharge. Left eye exhibits no discharge.  Neck: Neck supple.  Cardiovascular: Normal rate, regular rhythm and normal heart sounds.  Exam reveals no gallop and no friction rub.   No murmur heard. Pulmonary/Chest: Effort normal and breath sounds normal. No respiratory distress.  Abdominal: Soft. She exhibits no distension. There is no tenderness.  Musculoskeletal: She exhibits no edema and no tenderness.  Neurological: She is alert.  Strength is 5 out of 5 bilateral upper extremities. Sensation intact to light touch. Strong radial pulse good cap refill in fingers.  Skin: Skin is warm and dry.  Psychiatric: She has a normal mood and affect. Her behavior is normal. Thought content normal.    ED Course  Procedures (including critical care time) Labs Review Labs Reviewed - No data to display Imaging  Review No results found.  EKG Interpretation     Ventricular Rate:  105 PR Interval:  163 QRS Duration: 82 QT Interval:  340 QTC Calculation: 450 R  Axis:   39 Text Interpretation:  Sinus tachycardia Baseline wander in lead(s) V5 No significant change was found from previous            MDM   1. Cervical radiculopathy    42 year old female with symptoms consistent with a cervical radiculopathy. She was treated symptomatically with some improvement. She reports she has an appointment tomorrow with orthopedics to address the same issue. Patient with multiple ED evaluations for pain related complaints. Requesting prescription for pain medication. Unfortunately, chronic pain issues not ideally managed in ED.    Raeford Razor, MD 08/24/13 1534  Raeford Razor, MD 08/24/13 1535

## 2013-08-24 NOTE — ED Notes (Signed)
Patient discharged to home. DC instructions given with female significant other at bedside. Prescription x 1 given for pain med. Left unit in good condition ambulating to checkout accompanied by significant other. No concerns voiced. Left in good condition.

## 2013-08-25 ENCOUNTER — Other Ambulatory Visit: Payer: Self-pay | Admitting: Physician Assistant

## 2013-08-29 ENCOUNTER — Encounter (HOSPITAL_COMMUNITY): Payer: Self-pay | Admitting: Emergency Medicine

## 2013-08-29 ENCOUNTER — Emergency Department (HOSPITAL_COMMUNITY)
Admission: EM | Admit: 2013-08-29 | Discharge: 2013-08-29 | Disposition: A | Discharge status: Home / Self Care | Payer: 59 | Attending: Emergency Medicine | Admitting: Emergency Medicine

## 2013-08-29 DIAGNOSIS — Z87442 Personal history of urinary calculi: Secondary | ICD-10-CM | POA: Insufficient documentation

## 2013-08-29 DIAGNOSIS — G4730 Sleep apnea, unspecified: Secondary | ICD-10-CM | POA: Insufficient documentation

## 2013-08-29 DIAGNOSIS — F329 Major depressive disorder, single episode, unspecified: Secondary | ICD-10-CM | POA: Insufficient documentation

## 2013-08-29 DIAGNOSIS — I10 Essential (primary) hypertension: Secondary | ICD-10-CM | POA: Insufficient documentation

## 2013-08-29 DIAGNOSIS — Z87828 Personal history of other (healed) physical injury and trauma: Secondary | ICD-10-CM | POA: Insufficient documentation

## 2013-08-29 DIAGNOSIS — K219 Gastro-esophageal reflux disease without esophagitis: Secondary | ICD-10-CM | POA: Insufficient documentation

## 2013-08-29 DIAGNOSIS — F411 Generalized anxiety disorder: Secondary | ICD-10-CM | POA: Insufficient documentation

## 2013-08-29 DIAGNOSIS — F3289 Other specified depressive episodes: Secondary | ICD-10-CM | POA: Insufficient documentation

## 2013-08-29 DIAGNOSIS — Z87891 Personal history of nicotine dependence: Secondary | ICD-10-CM | POA: Insufficient documentation

## 2013-08-29 DIAGNOSIS — Z8739 Personal history of other diseases of the musculoskeletal system and connective tissue: Secondary | ICD-10-CM | POA: Insufficient documentation

## 2013-08-29 DIAGNOSIS — Z79899 Other long term (current) drug therapy: Secondary | ICD-10-CM | POA: Insufficient documentation

## 2013-08-29 DIAGNOSIS — M542 Cervicalgia: Secondary | ICD-10-CM | POA: Insufficient documentation

## 2013-08-29 DIAGNOSIS — G8929 Other chronic pain: Secondary | ICD-10-CM | POA: Insufficient documentation

## 2013-08-29 DIAGNOSIS — Z8619 Personal history of other infectious and parasitic diseases: Secondary | ICD-10-CM | POA: Insufficient documentation

## 2013-08-29 DIAGNOSIS — Z98811 Dental restoration status: Secondary | ICD-10-CM | POA: Insufficient documentation

## 2013-08-29 MED ORDER — HYDROMORPHONE HCL PF 2 MG/ML IJ SOLN: 4.0000 mg | Freq: Once | INTRAMUSCULAR | Status: DC

## 2013-08-29 MED ORDER — KETOROLAC TROMETHAMINE 60 MG/2ML IM SOLN
60.0000 mg | Freq: Once | INTRAMUSCULAR | Status: DC
  Filled 2013-08-29: qty 2

## 2013-08-29 MED ORDER — HYDROMORPHONE HCL PF 2 MG/ML IJ SOLN
2.0000 mg | Freq: Once | INTRAMUSCULAR | Status: DC
  Filled 2013-08-29: qty 1

## 2013-08-29 MED ORDER — HYDROMORPHONE HCL PF 2 MG/ML IJ SOLN
2.0000 mg | Freq: Once | INTRAMUSCULAR | Status: AC
  Administered 2013-08-29: 2 mg via INTRAMUSCULAR

## 2013-08-29 MED ORDER — ONDANSETRON 8 MG PO TBDP
8.0000 mg | ORAL_TABLET | Freq: Once | ORAL | Status: AC
  Administered 2013-08-29: 8 mg via ORAL
  Filled 2013-08-29: qty 1

## 2013-08-29 MED ORDER — DIAZEPAM 5 MG PO TABS
5.0000 mg | ORAL_TABLET | Freq: Once | ORAL | Status: AC
  Administered 2013-08-29: 5 mg via ORAL
  Filled 2013-08-29: qty 1

## 2013-08-29 NOTE — ED Notes (Signed)
Per pt, neck pain since September-has seen Neuro-gets cortizone shot next week

## 2013-08-29 NOTE — ED Provider Notes (Signed)
CSN: [id number]     Arrival date & time 08/29/13  1448 History  This chart was scribed for Denise Beck, PA working with Suzi Roots, MD by Quintella Reichert, ED Scribe. This patient was seen in room WTR9/WTR9 and the patient's care was started at [facility] PM.   Chief Complaint  Patient presents with  . Neck Pain    The history is provided by the patient. No language interpreter was used.    HPI Comments: [name] is a 42 y.o. female who presents to the Emergency Department complaining of exacerbation of her chronic left-sided neck pain.  Pt takes 4mg  dilaudid tablets 3x/day at home but she states this is not enough to control her pain.  She was recently taken off of NSAIDs in preparation for a procedure and she states this may be why her pain is not controlled at present.  She denies injuries.  Pt is scheduled to receive a cortisone shot in 6 days but states she needs medication to tide her over until then.  She normally receives IV dilaudid when she comes to the ED for pain and she states this is effective.   Past Medical History  Diagnosis Date  . Anxiety   . GERD (gastroesophageal reflux disease)   . History of alcohol abuse     no alcohol since 2004  . Migraine headache   . History of cocaine abuse     none since 2011, entered treatment  . History of kidney stones   . Arthritis     left knee  . Depression   . Complication of anesthesia 03/2011    history of aspiration after anesthesia  . History of esophageal dilatation     multiple  . History of pancreatitis 2012  . Hypertension     under control with med., has been on med. x 3 yr.  . Sleep apnea     only uses CPAP machine 3 x/week  . Medial meniscus tear 07/2013    right  . Cervical muscle strain 07/2013    current Prednisone taper  . Dental crowns present     x 2 upper front  . Genital herpes simplex type 2     Past Surgical History  Procedure Laterality Date  . Abdominal hysterectomy      partial  .  Cesarean section      x 2  . Nasal septum surgery    . Carpal tunnel release  07/29/2012    Procedure: CARPAL TUNNEL RELEASE;  Surgeon: Eldred Manges, MD;  Location: [facility];  Service: Orthopedics;  Laterality: Right;  Right carpal tunnel release  . Laparoscopic appendectomy  02/21/2006  . Laparoscopic lysis of adhesions  02/21/2006  . Cholecystectomy  04/05/2010  . Ercp w/ metal stent placement  03/14/2011  . Cystoscopy w/ ureteral stent placement Right 06/11/2007  . Nasal septoplasty w/ turbinoplasty Bilateral 07/26/2009    bilat. turb. reduction  . Knee arthroscopy Left     x 3  . Knee arthroscopy with medial menisectomy Right 07/31/2013    Procedure: KNEE ARTHROSCOPY WITH PARTIAL LATERAL MENISECTOMY, CHONDROPLASTY;  Surgeon: Loreta Ave, MD;  Location: [facility];  Service: Orthopedics;  Laterality: Right;    Family History  Problem Relation Age of Onset  . Heart disease Maternal Uncle   . Heart disease Maternal Grandmother   . Kidney cancer Mother   . Hypertension Mother   . Cancer Mother     renal  .  Lung cancer Maternal Aunt   . Stroke Maternal Uncle 55  . Alcohol abuse Maternal Uncle     History  Substance Use Topics  . Smoking status: Former Smoker    Quit date: 06/05/2013  . Smokeless tobacco: Never Used  . Alcohol Use: No     Comment: none since 2004    OB History   Grav Para Term Preterm Abortions TAB SAB Ect Mult Living                  Review of Systems  All other systems reviewed and are negative.     Allergies  Imitrex; Trazodone and nefazodone; Effexor; Metoclopramide hcl; Morphine and related; Tegretol; and Topamax  Home Medications   Current Outpatient Rx  Name  Route  Sig  Dispense  Refill  . amLODipine (NORVASC) 10 MG tablet   Oral   Take 10 mg by mouth every morning.         Marland Kitchen amphetamine-dextroamphetamine (ADDERALL) 30 MG tablet   Oral   Take 15 mg by mouth 3 (three) times daily.         .  cloNIDine (CATAPRES) 0.1 MG tablet   Oral   Take 0.1 mg by mouth 3 (three) times daily.         . diazepam (VALIUM) 10 MG tablet      TAKE 1 TABLET BY MOUTH EVERY 6 HOURS AS NEEDED FOR MUSCLE SPASMS   30 tablet   0   . EPINEPHrine (EPIPEN) 0.3 mg/0.3 mL SOAJ injection   Intramuscular   Inject 0.3 mg into the muscle daily as needed (Anaphylaxis).         Marland Kitchen HYDROmorphone (DILAUDID) 4 MG tablet   Oral   Take 4 mg by mouth every 6 (six) hours as needed for pain.         Marland Kitchen ibuprofen (ADVIL,MOTRIN) 200 MG tablet   Oral   Take 600 mg by mouth 2 (two) times daily as needed for pain.         . pantoprazole (PROTONIX) 40 MG tablet   Oral   Take 40 mg by mouth at bedtime.          . promethazine (PHENERGAN) 25 MG tablet   Oral   Take 25 mg by mouth every 6 (six) hours as needed for nausea.         . sertraline (ZOLOFT) 100 MG tablet   Oral   Take 100 mg by mouth at bedtime.          BP 138/98  Pulse 104  Temp(Src) 98.6 F (37 C) (Oral)  Resp 18  SpO2 100%  Physical Exam  Nursing note and vitals reviewed. Constitutional: She is oriented to person, place, and time. She appears well-developed and well-nourished. No distress.  HENT:  Head: Normocephalic and atraumatic.  Eyes: EOM are normal.  Neck: Neck supple. No tracheal deviation present.  Cardiovascular: Normal rate.   Pulmonary/Chest: Effort normal. No respiratory distress.  Musculoskeletal: Normal range of motion.  Left cervical paraspinal tenderness to palpation No midline spine tenderness  Neurological: She is alert and oriented to person, place, and time.  Upper extremity strength equal and intact bilaterally  Skin: Skin is warm and dry.  Psychiatric: She has a normal mood and affect. Her behavior is normal.    ED Course  Procedures (including critical care time)  DIAGNOSTIC STUDIES: Oxygen Saturation is 100% on room air, normal by my interpretation.    COORDINATION OF CARE:  3:28 PM:  Discussed treatment plan which includes IV pain medication.  Informed pt that no pain medications will be prescribed in the ED.  Pt expressed understanding and agreed to plan.   Labs Review Labs Reviewed - No data to display  Imaging Review No results found.  EKG Interpretation   None       MDM   1. Chronic neck pain    Patient will have 2mg  IM dilaudid. Patient instructed to follow up with her doctor for further management of her pain. No acute injury. Vitals stable and patient afebrile.     I personally performed the services described in this documentation, which was scribed in my presence. The recorded information has been reviewed and is accurate.    Denise Beck, PA-C 08/29/13 1949

## 2013-08-30 NOTE — ED Provider Notes (Signed)
Medical screening examination/treatment/procedure(s) were performed by non-physician practitioner and as supervising physician I was immediately available for consultation/collaboration.  Gayatri Teasdale T Anarosa Kubisiak, MD 08/30/13 2245 

## 2013-08-31 ENCOUNTER — Emergency Department (HOSPITAL_BASED_OUTPATIENT_CLINIC_OR_DEPARTMENT_OTHER)
Admission: EM | Admit: 2013-08-31 | Discharge: 2013-08-31 | Disposition: A | Discharge status: Home / Self Care | Payer: 59 | Attending: Emergency Medicine | Admitting: Emergency Medicine

## 2013-08-31 ENCOUNTER — Encounter (HOSPITAL_BASED_OUTPATIENT_CLINIC_OR_DEPARTMENT_OTHER): Payer: Self-pay | Admitting: Emergency Medicine

## 2013-08-31 DIAGNOSIS — Z87442 Personal history of urinary calculi: Secondary | ICD-10-CM | POA: Insufficient documentation

## 2013-08-31 DIAGNOSIS — Z87828 Personal history of other (healed) physical injury and trauma: Secondary | ICD-10-CM | POA: Insufficient documentation

## 2013-08-31 DIAGNOSIS — Z9889 Other specified postprocedural states: Secondary | ICD-10-CM | POA: Insufficient documentation

## 2013-08-31 DIAGNOSIS — Z87891 Personal history of nicotine dependence: Secondary | ICD-10-CM | POA: Insufficient documentation

## 2013-08-31 DIAGNOSIS — F329 Major depressive disorder, single episode, unspecified: Secondary | ICD-10-CM | POA: Insufficient documentation

## 2013-08-31 DIAGNOSIS — G4730 Sleep apnea, unspecified: Secondary | ICD-10-CM | POA: Insufficient documentation

## 2013-08-31 DIAGNOSIS — Z8619 Personal history of other infectious and parasitic diseases: Secondary | ICD-10-CM | POA: Insufficient documentation

## 2013-08-31 DIAGNOSIS — Z79899 Other long term (current) drug therapy: Secondary | ICD-10-CM | POA: Insufficient documentation

## 2013-08-31 DIAGNOSIS — I10 Essential (primary) hypertension: Secondary | ICD-10-CM | POA: Insufficient documentation

## 2013-08-31 DIAGNOSIS — K219 Gastro-esophageal reflux disease without esophagitis: Secondary | ICD-10-CM | POA: Insufficient documentation

## 2013-08-31 DIAGNOSIS — F3289 Other specified depressive episodes: Secondary | ICD-10-CM | POA: Insufficient documentation

## 2013-08-31 DIAGNOSIS — F411 Generalized anxiety disorder: Secondary | ICD-10-CM | POA: Insufficient documentation

## 2013-08-31 DIAGNOSIS — G43909 Migraine, unspecified, not intractable, without status migrainosus: Secondary | ICD-10-CM | POA: Insufficient documentation

## 2013-08-31 DIAGNOSIS — G8929 Other chronic pain: Secondary | ICD-10-CM | POA: Insufficient documentation

## 2013-08-31 DIAGNOSIS — M542 Cervicalgia: Secondary | ICD-10-CM | POA: Insufficient documentation

## 2013-08-31 DIAGNOSIS — M1710 Unilateral primary osteoarthritis, unspecified knee: Secondary | ICD-10-CM | POA: Insufficient documentation

## 2013-08-31 HISTORY — DX: Other cervical disc displacement, unspecified cervical region: M50.20

## 2013-08-31 MED ORDER — ONDANSETRON HCL 8 MG PO TABS
4.0000 mg | ORAL_TABLET | Freq: Once | ORAL | Status: AC
  Administered 2013-08-31: 4 mg via ORAL
  Filled 2013-08-31: qty 1

## 2013-08-31 MED ORDER — ONDANSETRON 4 MG PO TBDP
ORAL_TABLET | ORAL | Status: AC
  Filled 2013-08-31: qty 1

## 2013-08-31 MED ORDER — DIAZEPAM 5 MG PO TABS
5.0000 mg | ORAL_TABLET | Freq: Once | ORAL | Status: AC
  Administered 2013-08-31: 5 mg via ORAL
  Filled 2013-08-31: qty 1

## 2013-08-31 MED ORDER — ONDANSETRON HCL 4 MG PO TABS: 4.0000 mg | ORAL_TABLET | Freq: Four times a day (QID) | ORAL | Status: DC

## 2013-08-31 NOTE — ED Provider Notes (Signed)
Medical screening examination/treatment/procedure(s) were performed by non-physician practitioner and as supervising physician I was immediately available for consultation/collaboration.      Charles B. Sheldon, MD 08/31/13 2248 

## 2013-08-31 NOTE — ED Notes (Signed)
Pt reports neck pain- hx of herniated disc- scheduled for injection on thursday

## 2013-08-31 NOTE — ED Provider Notes (Signed)
CSN: [id number]     Arrival date & time 08/31/13  1800 History   First MD Initiated Contact with Patient 08/31/13 1807     Chief Complaint  Patient presents with  . Neck Pain   (Consider location/radiation/quality/duration/timing/severity/associated sxs/prior Treatment) HPI Comments: Patient is a 42 year old female past medical history of chronic neck pain who presents to the emergency department for the eighth time over the past month complaining of neck pain. Patient is scheduled for an injection on Thursday for a herniated disc, has been taking Dilaudid at home with minimal relief. Each time she presents to the emergency department she receives IV or IM Dilaudid and reports her pain improves, gets discharged home with prescriptions for Dilaudid. She was taken off anti-inflammatories in preparation for the injection on Thursday and states this is the reason why her pain is worsening. Pain worse with neck movement, currently rated 8/10. Pain worsened this morning when she was in the car, no known injury or trauma. At that time she began to feel nauseated and is still currently nauseous. Denies fever, chills or night sweats. Denies pain, numbness or tingling radiating down her extremities.  Patient is a 42 y.o. female presenting with neck pain. The history is provided by the patient and a significant other.  Neck Pain   Past Medical History  Diagnosis Date  . Anxiety   . GERD (gastroesophageal reflux disease)   . History of alcohol abuse     no alcohol since 2004  . Migraine headache   . History of cocaine abuse     none since 2011, entered treatment  . History of kidney stones   . Arthritis     left knee  . Depression   . Complication of anesthesia 03/2011    history of aspiration after anesthesia  . History of esophageal dilatation     multiple  . History of pancreatitis 2012  . Hypertension     under control with med., has been on med. x 3 yr.  . Sleep apnea     only uses CPAP  machine 3 x/week  . Medial meniscus tear 07/2013    right  . Cervical muscle strain 07/2013    current Prednisone taper  . Dental crowns present     x 2 upper front  . Genital herpes simplex type 2   . Herniated disc, cervical    Past Surgical History  Procedure Laterality Date  . Abdominal hysterectomy      partial  . Cesarean section      x 2  . Nasal septum surgery    . Carpal tunnel release  07/29/2012    Procedure: CARPAL TUNNEL RELEASE;  Surgeon: Eldred Manges, MD;  Location: [facility];  Service: Orthopedics;  Laterality: Right;  Right carpal tunnel release  . Laparoscopic appendectomy  02/21/2006  . Laparoscopic lysis of adhesions  02/21/2006  . Cholecystectomy  04/05/2010  . Ercp w/ metal stent placement  03/14/2011  . Cystoscopy w/ ureteral stent placement Right 06/11/2007  . Nasal septoplasty w/ turbinoplasty Bilateral 07/26/2009    bilat. turb. reduction  . Knee arthroscopy Left     x 3  . Knee arthroscopy with medial menisectomy Right 07/31/2013    Procedure: KNEE ARTHROSCOPY WITH PARTIAL LATERAL MENISECTOMY, CHONDROPLASTY;  Surgeon: Loreta Ave, MD;  Location: [facility];  Service: Orthopedics;  Laterality: Right;   Family History  Problem Relation Age of Onset  . Heart disease Maternal Uncle   .  Heart disease Maternal Grandmother   . Kidney cancer Mother   . Hypertension Mother   . Cancer Mother     renal  . Lung cancer Maternal Aunt   . Stroke Maternal Uncle 55  . Alcohol abuse Maternal Uncle    History  Substance Use Topics  . Smoking status: Former Smoker    Quit date: 06/05/2013  . Smokeless tobacco: Never Used  . Alcohol Use: No     Comment: none since 2004   OB History   Grav Para Term Preterm Abortions TAB SAB Ect Mult Living                 Review of Systems  Musculoskeletal: Positive for neck pain.  All other systems reviewed and are negative.    Allergies  Imitrex; Trazodone and nefazodone; Effexor;  Metoclopramide hcl; Morphine and related; Tegretol; and Topamax  Home Medications   Current Outpatient Rx  Name  Route  Sig  Dispense  Refill  . amLODipine (NORVASC) 10 MG tablet   Oral   Take 10 mg by mouth every morning.         Marland Kitchen amphetamine-dextroamphetamine (ADDERALL) 30 MG tablet   Oral   Take 15 mg by mouth 3 (three) times daily.         . cloNIDine (CATAPRES) 0.1 MG tablet   Oral   Take 0.1 mg by mouth 3 (three) times daily.         . diazepam (VALIUM) 10 MG tablet      TAKE 1 TABLET BY MOUTH EVERY 6 HOURS AS NEEDED FOR MUSCLE SPASMS   30 tablet   0   . EPINEPHrine (EPIPEN) 0.3 mg/0.3 mL SOAJ injection   Intramuscular   Inject 0.3 mg into the muscle daily as needed (Anaphylaxis).         Marland Kitchen HYDROmorphone (DILAUDID) 4 MG tablet   Oral   Take 4 mg by mouth every 6 (six) hours as needed for pain.         Marland Kitchen ibuprofen (ADVIL,MOTRIN) 200 MG tablet   Oral   Take 600 mg by mouth 2 (two) times daily as needed for pain.         . pantoprazole (PROTONIX) 40 MG tablet   Oral   Take 40 mg by mouth at bedtime.          . promethazine (PHENERGAN) 25 MG tablet   Oral   Take 25 mg by mouth every 6 (six) hours as needed for nausea.         . sertraline (ZOLOFT) 100 MG tablet   Oral   Take 100 mg by mouth at bedtime.          BP 138/104  Pulse 76  Temp(Src) 98.6 F (37 C) (Oral)  Resp 18  Ht 5\' 6"  (1.676 m)  Wt 180 lb (81.647 kg)  BMI 29.07 kg/m2  SpO2 100% Physical Exam  Nursing note and vitals reviewed. Constitutional: She is oriented to person, place, and time. She appears well-developed and well-nourished. No distress.  HENT:  Head: Normocephalic and atraumatic.  Mouth/Throat: Oropharynx is clear and moist.  Eyes: Conjunctivae and EOM are normal. Pupils are equal, round, and reactive to light.  Neck: Normal range of motion. Neck supple.  Full range of motion of cervical spine. Tenderness to palpation of left paracervical muscles. No  erythema, edema. No spinous process tenderness.  Cardiovascular: Normal rate, regular rhythm and normal heart sounds.   Pulmonary/Chest: Effort normal and  breath sounds normal.  Musculoskeletal: Normal range of motion. She exhibits no edema.  Neurological: She is alert and oriented to person, place, and time.  Strength upper extremities 5 out of 5 and equal bilateral. Sensation intact.  Skin: Skin is warm and dry. She is not diaphoretic.  Psychiatric: She has a normal mood and affect. Her behavior is normal.    ED Course  Procedures (including critical care time) Labs Review Labs Reviewed - No data to display Imaging Review No results found.  EKG Interpretation   None       MDM   1. Chronic neck pain    Patient with chronic neck pain, seen in the emergency department multiple times for the same, receives IV or IM Dilaudid. No new injury or trauma. No red flags concerning patient's neck pain. No focal neurologic deficits, afebrile, no fever, chills or night sweats. I do not feel it is appropriate for her to continue to return for IV or IM medications, discuss this with attending Dr. Bernette Mayers who is agreeable. Will treat her nausea with Zofran, 1 valium tablet. Followup with her neck specialists.     Trevor Mace, PA-C 08/31/13 1826  Trevor Mace, PA-C 08/31/13 1840

## 2013-09-01 ENCOUNTER — Encounter (HOSPITAL_COMMUNITY): Payer: Self-pay | Admitting: Emergency Medicine

## 2013-09-01 ENCOUNTER — Emergency Department (HOSPITAL_COMMUNITY)
Admission: EM | Admit: 2013-09-01 | Discharge: 2013-09-01 | Disposition: A | Discharge status: Home / Self Care | Payer: 59 | Attending: Emergency Medicine | Admitting: Emergency Medicine

## 2013-09-01 DIAGNOSIS — G4733 Obstructive sleep apnea (adult) (pediatric): Secondary | ICD-10-CM | POA: Insufficient documentation

## 2013-09-01 DIAGNOSIS — G8929 Other chronic pain: Secondary | ICD-10-CM | POA: Insufficient documentation

## 2013-09-01 DIAGNOSIS — M5412 Radiculopathy, cervical region: Secondary | ICD-10-CM | POA: Insufficient documentation

## 2013-09-01 DIAGNOSIS — M5020 Other cervical disc displacement, unspecified cervical region: Secondary | ICD-10-CM | POA: Insufficient documentation

## 2013-09-01 DIAGNOSIS — Z87891 Personal history of nicotine dependence: Secondary | ICD-10-CM | POA: Insufficient documentation

## 2013-09-01 DIAGNOSIS — Z79899 Other long term (current) drug therapy: Secondary | ICD-10-CM | POA: Insufficient documentation

## 2013-09-01 DIAGNOSIS — I10 Essential (primary) hypertension: Secondary | ICD-10-CM | POA: Insufficient documentation

## 2013-09-01 DIAGNOSIS — F411 Generalized anxiety disorder: Secondary | ICD-10-CM | POA: Insufficient documentation

## 2013-09-01 DIAGNOSIS — F329 Major depressive disorder, single episode, unspecified: Secondary | ICD-10-CM | POA: Insufficient documentation

## 2013-09-01 DIAGNOSIS — M542 Cervicalgia: Secondary | ICD-10-CM | POA: Insufficient documentation

## 2013-09-01 DIAGNOSIS — F3289 Other specified depressive episodes: Secondary | ICD-10-CM | POA: Insufficient documentation

## 2013-09-01 DIAGNOSIS — K219 Gastro-esophageal reflux disease without esophagitis: Secondary | ICD-10-CM | POA: Insufficient documentation

## 2013-09-01 MED ORDER — HYDROMORPHONE HCL PF 2 MG/ML IJ SOLN: 2.0000 mg | Freq: Once | INTRAMUSCULAR | Status: DC

## 2013-09-01 NOTE — ED Notes (Signed)
Family at bedside. 

## 2013-09-01 NOTE — ED Notes (Signed)
Pt c/o neck pain chronic. Unable to control with current medication. Seen at [facility]. Pain continued now radiating into left arm.

## 2013-09-01 NOTE — ED Provider Notes (Signed)
CSN: [id number]     Arrival date & time 09/01/13  4098 History   First MD Initiated Contact with Patient 09/01/13 0109     Chief Complaint  Patient presents with  . Neck Pain   HPI  History provided by the patient and recent medical charts. Patient is a 42 year old female who returns to the emergency room with complaints of continued ongoing pain in her neck radiating down the left upper extremity. Patient has history of multiple visits emergency room for the same complaint with diagnosed cervical radiculopathy. Patient reports having an appointment this coming Thursday to be seen by a spine specialist with plans for injections under fluoroscopy. Patient is currently on home narcotic pain medications and Valium but states these have not helped. She took 4 mg of by mouth Dilaudid and 10 mg of Valium shortly before arrival. She states she continues to have pain with pain moving into her left upper arm and elbow. Denies any change in weakness or numbness to the hand or arms. Denies any weakness or numbness down the back or legs. No recent fever, chills or sweats. No other changes in symptoms.    Past Medical History  Diagnosis Date  . Anxiety   . GERD (gastroesophageal reflux disease)   . History of alcohol abuse     no alcohol since 2004  . Migraine headache   . History of cocaine abuse     none since 2011, entered treatment  . History of kidney stones   . Arthritis     left knee  . Depression   . Complication of anesthesia 03/2011    history of aspiration after anesthesia  . History of esophageal dilatation     multiple  . History of pancreatitis 2012  . Hypertension     under control with med., has been on med. x 3 yr.  . Sleep apnea     only uses CPAP machine 3 x/week  . Medial meniscus tear 07/2013    right  . Cervical muscle strain 07/2013    current Prednisone taper  . Dental crowns present     x 2 upper front  . Genital herpes simplex type 2   . Herniated disc, cervical      Past Surgical History  Procedure Laterality Date  . Abdominal hysterectomy      partial  . Cesarean section      x 2  . Nasal septum surgery    . Carpal tunnel release  07/29/2012    Procedure: CARPAL TUNNEL RELEASE;  Surgeon: Eldred Manges, MD;  Location: [facility];  Service: Orthopedics;  Laterality: Right;  Right carpal tunnel release  . Laparoscopic appendectomy  02/21/2006  . Laparoscopic lysis of adhesions  02/21/2006  . Cholecystectomy  04/05/2010  . Ercp w/ metal stent placement  03/14/2011  . Cystoscopy w/ ureteral stent placement Right 06/11/2007  . Nasal septoplasty w/ turbinoplasty Bilateral 07/26/2009    bilat. turb. reduction  . Knee arthroscopy Left     x 3  . Knee arthroscopy with medial menisectomy Right 07/31/2013    Procedure: KNEE ARTHROSCOPY WITH PARTIAL LATERAL MENISECTOMY, CHONDROPLASTY;  Surgeon: Loreta Ave, MD;  Location: [facility];  Service: Orthopedics;  Laterality: Right;   Family History  Problem Relation Age of Onset  . Heart disease Maternal Uncle   . Heart disease Maternal Grandmother   . Kidney cancer Mother   . Hypertension Mother   . Cancer Mother  renal  . Lung cancer Maternal Aunt   . Stroke Maternal Uncle 55  . Alcohol abuse Maternal Uncle    History  Substance Use Topics  . Smoking status: Former Smoker    Quit date: 06/05/2013  . Smokeless tobacco: Never Used  . Alcohol Use: No     Comment: none since 2004   OB History   Grav Para Term Preterm Abortions TAB SAB Ect Mult Living                 Review of Systems  Constitutional: Negative for fever, chills and diaphoresis.  All other systems reviewed and are negative.    Allergies  Imitrex; Trazodone and nefazodone; Effexor; Metoclopramide hcl; Morphine and related; Tegretol; and Topamax  Home Medications   Current Outpatient Rx  Name  Route  Sig  Dispense  Refill  . amLODipine (NORVASC) 10 MG tablet   Oral   Take 10 mg by mouth every  morning.         Marland Kitchen amphetamine-dextroamphetamine (ADDERALL) 30 MG tablet   Oral   Take 15 mg by mouth 3 (three) times daily.         . cloNIDine (CATAPRES) 0.1 MG tablet   Oral   Take 0.1 mg by mouth 3 (three) times daily.         . diazepam (VALIUM) 10 MG tablet   Oral   Take 10 mg by mouth every 6 (six) hours as needed (muscle spasms).         Marland Kitchen HYDROmorphone (DILAUDID) 4 MG tablet   Oral   Take 4 mg by mouth every 6 (six) hours as needed for pain.         Marland Kitchen ibuprofen (ADVIL,MOTRIN) 200 MG tablet   Oral   Take 600 mg by mouth 2 (two) times daily as needed for pain.         Marland Kitchen ondansetron (ZOFRAN) 4 MG tablet   Oral   Take 1 tablet (4 mg total) by mouth every 6 (six) hours.   4 tablet   0   . pantoprazole (PROTONIX) 40 MG tablet   Oral   Take 40 mg by mouth at bedtime.          . promethazine (PHENERGAN) 25 MG tablet   Oral   Take 25 mg by mouth every 6 (six) hours as needed for nausea.         . sertraline (ZOLOFT) 100 MG tablet   Oral   Take 100 mg by mouth at bedtime.         Marland Kitchen EPINEPHrine (EPIPEN) 0.3 mg/0.3 mL SOAJ injection   Intramuscular   Inject 0.3 mg into the muscle once as needed (Anaphylaxis).           BP 159/95  Pulse 89  Temp(Src) 98.2 F (36.8 C) (Oral)  Ht 5\' 6"  (1.676 m)  Wt 180 lb (81.647 kg)  BMI 29.07 kg/m2  SpO2 98% Physical Exam  Nursing note and vitals reviewed. Constitutional: She is oriented to person, place, and time. She appears well-developed and well-nourished. No distress.  HENT:  Head: Normocephalic and atraumatic.  Eyes: Conjunctivae are normal.  Neck: Muscular tenderness present.    No deformities.  Cardiovascular: Normal rate and regular rhythm.   Pulmonary/Chest: Effort normal and breath sounds normal. No respiratory distress. She has no wheezes. She has no rales.  Musculoskeletal: Normal range of motion. She exhibits tenderness. She exhibits no edema.  Tenderness along the left elbow and  upper  extremity. No swelling or deformities.  Neurological: She is alert and oriented to person, place, and time.  Skin: Skin is warm and dry. No rash noted.  Psychiatric: She has a normal mood and affect. Her behavior is normal.    ED Course  Procedures  Patient seen and evaluated. She is well appearing she does not appear in significant discomfort or severe pain. She has multiple recent visits for same.  Cervical Injection Performed by: Angus Seller Authorized by: Angus Seller Consent: Verbal consent obtained. Risks and benefits: risks, benefits and alternatives were discussed Consent given by: patient Patient identity confirmed: provided demographic data  Location: Bilateral lower cervical spine  Local anesthetic: Lidocaine 2% without epinephrine and Bupivacaine 0.5% with epinephrine  Anesthetic total: 1 ml lidocaine and 1.8 ml bupivacaine to each side.  (2 ml lidocaine, 3.6 ml bupivacaine total)  Procedure: Skin was cleaned with ChloraPrep. 1 ml lidocaine was injected superficially bilateral lower cervical paraspinous area. This was followed by 1.8 ml bupivacaine injections deep to the paraspinous area. There was no significant bleeding or complications.   Patient tolerance: Patient tolerated the procedure well with no immediate complications. Pain improved.     MDM   1. Chronic pain   2. Cervical radiculopathy     Patient with chronic neck pain, seen in the emergency department multiple times for the same, receives IV or IM Dilaudid. No new injury or trauma. No red flags concerning patient's neck pain. No focal neurologic deficits, afebrile, no fever, chills or night sweats. I do not feel it is appropriate for her to continue to return for IV or IM medications.  This was the same plan at her most recent previous visit and I will continue to support this plan.  This was discussed with attending Dr. Read Drivers who is agreeable.  I will treat her pain with cervical injections of  bupivacaine. Patient will be instructed to followup with her neck specialists on Thursday as planned.     Angus Seller, PA-C 09/01/13 0222

## 2013-09-01 NOTE — ED Provider Notes (Signed)
Medical screening examination/treatment/procedure(s) were performed by non-physician practitioner and as supervising physician I was immediately available for consultation/collaboration.  EKG Interpretation   None         Hanley Seamen, MD 09/01/13 [phone]

## 2013-09-01 NOTE — ED Notes (Signed)
PA at bedside.

## 2013-09-02 ENCOUNTER — Encounter: Payer: Self-pay | Admitting: Physician Assistant

## 2013-09-02 ENCOUNTER — Ambulatory Visit (INDEPENDENT_AMBULATORY_CARE_PROVIDER_SITE_OTHER): Payer: 59 | Admitting: Physician Assistant

## 2013-09-02 DIAGNOSIS — M542 Cervicalgia: Secondary | ICD-10-CM

## 2013-09-02 DIAGNOSIS — R69 Illness, unspecified: Secondary | ICD-10-CM

## 2013-09-02 MED ORDER — OXYCODONE-ACETAMINOPHEN 10-325 MG PO TABS: 1.0000 | ORAL_TABLET | ORAL | Status: DC | PRN

## 2013-09-02 MED ORDER — DIAZEPAM 10 MG PO TABS: 10.0000 mg | ORAL_TABLET | Freq: Four times a day (QID) | ORAL | Status: DC | PRN

## 2013-09-02 NOTE — Progress Notes (Signed)
Patient ID: LAURYN LIZARDI MRN: [id number], DOB: 05-Dec-1970, 42 y.o. Date of Encounter: 09/02/2013, 8:27 PM  Primary Physician: JEFFERY,[name], PA-C  Chief Complaint: Medication refill  HPI: 42 y.o. female with history below presents for medication refill. Patient with known minimal disk bulge C5-6 without significant spinal stenosis appreciated demonstrated on cervical MRI. Currently taking Percocet 10/325 mg q 4 hours #90. She gets this every 15 days. She also takes Valium 10 mg q 6 hours #30 for her neck pain. She needs a refill on both of these medications. She requests that we set her up with a different pain management center. She also requests that we send in an "extended release pain medication" to help her out until she is able to start her epidural steroid injections. She is otherwise doing ok.       Past Medical History  Diagnosis Date  . Anxiety   . GERD (gastroesophageal reflux disease)   . History of alcohol abuse     no alcohol since 2004  . Migraine headache   . History of cocaine abuse     none since 2011, entered treatment  . History of kidney stones   . Arthritis     left knee  . Depression   . Complication of anesthesia 03/2011    history of aspiration after anesthesia  . History of esophageal dilatation     multiple  . History of pancreatitis 2012  . Hypertension     under control with med., has been on med. x 3 yr.  . Sleep apnea     only uses CPAP machine 3 x/week  . Medial meniscus tear 07/2013    right  . Cervical muscle strain 07/2013    current Prednisone taper  . Dental crowns present     x 2 upper front  . Genital herpes simplex type 2   . Herniated disc, cervical      Home Meds: Prior to Admission medications   Medication Sig Start Date End Date Taking? Authorizing Provider  amLODipine (NORVASC) 10 MG tablet Take 10 mg by mouth every morning.   Yes Historical Provider, MD  amphetamine-dextroamphetamine (ADDERALL) 30 MG tablet Take 15 mg by  mouth 3 (three) times daily.   Yes Historical Provider, MD  cloNIDine (CATAPRES) 0.1 MG tablet Take 0.1 mg by mouth 3 (three) times daily.   Yes Historical Provider, MD  diazepam (VALIUM) 10 MG tablet Take 1 tablet (10 mg total) by mouth every 6 (six) hours as needed (muscle spasms). 09/02/13  Yes Ryan M Dunn, PA-C  ondansetron (ZOFRAN) 4 MG tablet Take 1 tablet (4 mg total) by mouth every 6 (six) hours. 08/31/13  Yes Trevor Mace, PA-C  oxyCODONE-acetaminophen (PERCOCET) 10-325 MG per tablet Take 1 tablet by mouth every 4 (four) hours as needed for pain. 09/02/13  Yes Ryan M Dunn, PA-C  pantoprazole (PROTONIX) 40 MG tablet Take 40 mg by mouth at bedtime.    Yes Historical Provider, MD  promethazine (PHENERGAN) 25 MG tablet Take 25 mg by mouth every 6 (six) hours as needed for nausea.   Yes Historical Provider, MD  sertraline (ZOLOFT) 100 MG tablet Take 100 mg by mouth at bedtime.   Yes Historical Provider, MD  EPINEPHrine (EPIPEN) 0.3 mg/0.3 mL SOAJ injection Inject 0.3 mg into the muscle once as needed (Anaphylaxis).     Historical Provider, MD  HYDROmorphone (DILAUDID) 4 MG tablet Take 4 mg by mouth every 6 (six) hours as needed for pain.  Historical Provider, MD  ibuprofen (ADVIL,MOTRIN) 200 MG tablet Take 600 mg by mouth 2 (two) times daily as needed for pain.    Historical Provider, MD    Allergies:  Allergies  Allergen Reactions  . Imitrex [Sumatriptan Base] Shortness Of Breath and Other (See Comments)    TACHYCARDIA  . Trazodone And Nefazodone Other (See Comments)    NIGHTMARES  . Effexor [Venlafaxine] Other (See Comments)    MAKES HER "JITTERY"  . Metoclopramide Hcl Itching  . Morphine And Related Itching  . Tegretol [Carbamazepine] Itching  . Topamax Itching    History   Social History  . Marital Status: Significant Other    Spouse Name: Dustin Flock    Number of Children: 2  . Years of Education: N/A   Occupational History  . CNA    Social History Main Topics    . Smoking status: Former Smoker    Quit date: 06/05/2013  . Smokeless tobacco: Never Used  . Alcohol Use: No     Comment: none since 2004  . Drug Use: No     Comment: none since 2011  . Sexual Activity: Yes    Partners: Female   Other Topics Concern  . Not on file   Social History Narrative   3 caffeine drinks daily       Was recruited to [employer], with a full scholarship, for undergraduate studies, but had to decline due to family problems.  She then planned to attend [employer] ([employer]) but wasn't able to work and attend courses, so went to [employer] and studied Production designer, theatre/television/film.      Has dropped out of the substance abuse counseling program she was enrolled in, having figured out that it's not a career path she wants to continue.  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                    Review of Systems: As above.    Physical Exam: There were no vitals taken for this visit., There is no weight on file to calculate BMI. General: Well developed, well nourished, in no acute distress. Head: Normocephalic, atraumatic, eyes without discharge, sclera non-icteric, nares are without discharge. Bilateral auditory canals clear, TM's are without perforation, pearly grey and translucent with reflective cone of light bilaterally. Oral cavity moist, posterior pharynx without exudate, erythema, peritonsillar abscess, or post nasal drip.  Neck: Supple. No thyromegaly. No lymphadenopathy. Lungs: Clear bilaterally to auscultation without wheezes, rales, or rhonchi. Breathing is unlabored. Heart: RRR with S1 S2. No murmurs, rubs, or gallops appreciated. Msk:  Strength and tone normal for age. Extremities/Skin: Warm and dry. No clubbing or cyanosis. No edema. No rashes or suspicious lesions. Neuro: Alert and oriented X 3. Moves all extremities spontaneously. Gait is normal. CNII-XII grossly in tact. Psych:  Responds to questions appropriately with a normal affect.     ASSESSMENT AND  PLAN:  42 y.o. female here for long term medication management and chronic neck pain.  -Refilled Percocet 10/325 mg 1 po q 4 hours #90 no RF -Refilled Valium 10 mg 1 po q 6 hours #30 no RF -Patient requests for Ms. Leotis Shames, PA-C to re-refer her back into a different pain management clinic -Patient also requests for her PCP to write for some "extended release pain medication" until she is able to start her epidural steroid injections. I have advised her this needs to be discussed with her PCP for continuity of care sake. Patient understood.  -Follow  up as directed with PCP  Signed, Eula Listen, PA-C [facility] [place], [place] [address] [phone] 09/02/2013 8:27 PM

## 2013-09-06 ENCOUNTER — Encounter: Payer: Self-pay | Admitting: Physician Assistant

## 2013-09-08 ENCOUNTER — Ambulatory Visit (INDEPENDENT_AMBULATORY_CARE_PROVIDER_SITE_OTHER): Payer: 59 | Admitting: Physician Assistant

## 2013-09-08 VITALS — BP 140/90 | HR 100 | Temp 97.9°F | Resp 18 | Ht 65.5 in | Wt 188.0 lb

## 2013-09-08 DIAGNOSIS — M5412 Radiculopathy, cervical region: Secondary | ICD-10-CM

## 2013-09-08 DIAGNOSIS — M545 Low back pain, unspecified: Secondary | ICD-10-CM

## 2013-09-08 DIAGNOSIS — F988 Other specified behavioral and emotional disorders with onset usually occurring in childhood and adolescence: Secondary | ICD-10-CM

## 2013-09-08 DIAGNOSIS — G894 Chronic pain syndrome: Secondary | ICD-10-CM

## 2013-09-08 DIAGNOSIS — M542 Cervicalgia: Secondary | ICD-10-CM

## 2013-09-08 MED ORDER — DIAZEPAM 10 MG PO TABS: 10.0000 mg | ORAL_TABLET | Freq: Four times a day (QID) | ORAL | Status: DC | PRN

## 2013-09-08 MED ORDER — OXYCODONE HCL 30 MG PO TABS: 30.0000 mg | ORAL_TABLET | ORAL | Status: DC | PRN

## 2013-09-08 MED ORDER — AMPHETAMINE-DEXTROAMPHETAMINE 30 MG PO TABS: 15.0000 mg | ORAL_TABLET | Freq: Three times a day (TID) | ORAL | Status: DC

## 2013-09-08 NOTE — Progress Notes (Signed)
  Subjective:    Patient ID: [name], female    DOB: January 24, 1971, 42 y.o.   MRN: [id number]  HPI  This 42 y.o. female presents for evaluation of chronic pain, with primary pain currently in the neck, associated with LEFT sided arm pain.  See previous notes here and in the ED, extending back >4 weeks. MRI revealed mild degenerative changes, without cause for her considerable pain. Unfortunately, her prescribed Percocet 10/325 TID has been inadequate.  She has sought relief with dilaudid in the ED, but since that has been discontinued (and no longer offered), she's been taking Percocet 10/325 mg, 2-3 every 4 hours to tolerate the pain. Seen at [facility] ED 09/02/2013.  Has had the first epidural steroid injection in the neck on 09/04/2013.  Initially, she had very good improvement, possibly resolved, of her arm pain.  This morning, however, she's had increased pain again, primarily in the LEFT elbow, and has developed an epidural HA, as Dr. Maurice Small advised her it might.  She and her partner were very pleased with the experience with him. She is to follow-up in about a month.  She is interested in referral to Pain Management, and would like to discuss the use of an extended-release pain product.  She was hopeful for a future that did not include chronic pain medication, but is realizing that that goal may not be achievable. She was previously scheduled at [facility], but after initial evaluation and drug-screen, she felt uncomfortable with the proposed treatment plan.  She did not sign the contract, received no treatment, and weaned herself off OxyContin.  Unfortunately, since then, she's had knee surgery for a meniscal injury and then this issue with her neck.  Medications, allergies, past medical history, surgical history, family history, social history and problem list reviewed.  She will also need a refill of Adderall and Valium.  She is accompanied today by her partner,  [name].  Review of Systems No chest pain, SOB, dizziness, vision change, N/V, diarrhea, constipation, dysuria, urinary urgency or frequency, or rash.     Objective:   Physical Exam Blood pressure 140/90, pulse 100, temperature 97.9 F (36.6 C), temperature source Oral, resp. rate 18, height 5' 5.5" (1.664 m), weight 188 lb (85.276 kg), SpO2 97.00%. Body mass index is 30.8 kg/(m^2). Well-developed, well nourished WF who is awake, alert and oriented, in NAD. HEENT: [place]/AT, sclera and conjunctiva are clear.   Neck: supple, non-tender, no lymphadenopathy, thyromegaly. Posterior neck with tenderness on palpation, and spasm that extends into the trapezius on the LEFT. Heart: RRR, no murmur Lungs: normal effort, CTA Extremities: no cyanosis, clubbing or edema. LEFT shoulder and arm not examined in detail so as not to exacerbate her pain.  Good sensation and grasp strength. Skin: warm and dry without rash. Psychologic: good mood and appropriate affect, normal speech and behavior.        Assessment & Plan:  Cervical pain/Cervical radiculopathy, Lumbar back pain/Chronic pain disorder- Plan: diazepam (VALIUM) 10 MG tablet, oxycodone (ROXICODONE) 30 MG immediate release tablet Q 4 hours PRN pain, #180 (30-day supply), Ambulatory referral to Pain Clinic  ADD (attention deficit disorder) - Plan: amphetamine-dextroamphetamine (ADDERALL) 30 MG tablet  Elect to continue immediate release pain medication for now, and allow Pain Management to select their preferred treatment for her pain.  [name], PA-C Physician Assistant-Certified [facility]

## 2013-09-08 NOTE — Patient Instructions (Signed)
STOP the Percocet 10/325 mg. Replace it with Roxicet 30 mg, take 1 tablet every 4 hours, as needed for pain. Use Valium as before.  If you have not heard from the [facility] office in the next 7 days, please contact me.

## 2013-09-14 ENCOUNTER — Ambulatory Visit (INDEPENDENT_AMBULATORY_CARE_PROVIDER_SITE_OTHER): Payer: 59 | Admitting: Internal Medicine

## 2013-09-14 ENCOUNTER — Encounter: Payer: Self-pay | Admitting: Family Medicine

## 2013-09-14 ENCOUNTER — Ambulatory Visit (HOSPITAL_COMMUNITY)
Admission: RE | Admit: 2013-09-14 | Discharge: 2013-09-14 | Disposition: A | Discharge status: Home / Self Care | Payer: 59 | Source: Ambulatory Visit | Attending: Internal Medicine | Admitting: Internal Medicine

## 2013-09-14 VITALS — BP 120/76 | HR 90 | Temp 98.0°F | Resp 16 | Ht 66.0 in | Wt 195.0 lb

## 2013-09-14 DIAGNOSIS — L0390 Cellulitis, unspecified: Secondary | ICD-10-CM

## 2013-09-14 DIAGNOSIS — L0291 Cutaneous abscess, unspecified: Secondary | ICD-10-CM

## 2013-09-14 DIAGNOSIS — M79609 Pain in unspecified limb: Secondary | ICD-10-CM

## 2013-09-14 DIAGNOSIS — M79604 Pain in right leg: Secondary | ICD-10-CM

## 2013-09-14 DIAGNOSIS — R52 Pain, unspecified: Secondary | ICD-10-CM

## 2013-09-14 DIAGNOSIS — M7989 Other specified soft tissue disorders: Secondary | ICD-10-CM

## 2013-09-14 LAB — POCT CBC
Granulocyte percent: 36.6 %G — AB (ref 37–80)
HCT, POC: 41.4 % (ref 37.7–47.9)
Lymph, poc: 3.2 (ref 0.6–3.4)
MID (cbc): 0.5 (ref 0–0.9)
MPV: 7.9 fL (ref 0–99.8)
POC MID %: 8.9 %M (ref 0–12)
Platelet Count, POC: 280 10*3/uL (ref 142–424)
RBC: 4.41 M/uL (ref 4.04–5.48)

## 2013-09-14 MED ORDER — AMOXICILLIN-POT CLAVULANATE 875-125 MG PO TABS: 1.0000 | ORAL_TABLET | Freq: Two times a day (BID) | ORAL | Status: DC

## 2013-09-14 NOTE — Progress Notes (Signed)
VASCULAR LAB PRELIMINARY  PRELIMINARY  PRELIMINARY  PRELIMINARY  Right lower extremity venous Doppler completed.    Preliminary report:  There is no DVT or SVT noted in the right lower extremity.  Lynton Crescenzo, RVT 09/14/2013, 11:42 AM

## 2013-09-14 NOTE — Progress Notes (Addendum)
Subjective:    Patient ID: [name], female    DOB: [birth year], 42 y.o.   MRN: [id number] This chart was scribed for Denise Sia, MD by [name], ED Scribe. This patient was seen in room 09 and the patient's care was started at 09:15 AM. HPI HPI Comments: [name] is a 42 y.o. female who [facility] the [facility] complaining of right foot pain radiating up the leg with associated swelling onset two days ago. Pt has also noticed some redness to the area. She reports taking antibiotics for treatment-leftover augmentin 2-3 doses, and the redness has started improving this am.  She denies fever, night sweats, dysphagia, and coughing. She states that she feels "tightness" in calf when she walks. No known injury  Knee arthos 1 mo ago dr [name]   Past Medical History  Diagnosis Date   Anxiety    GERD (gastroesophageal reflux disease)    History of alcohol abuse     no alcohol since 2004   Migraine headache    History of cocaine abuse     none since 2011, entered treatment   History of kidney stones    Arthritis     left knee   Depression    Complication of anesthesia 03/2011    history of aspiration after anesthesia   History of esophageal dilatation     multiple   History of pancreatitis 2012   Hypertension     under control with med., has been on med. x 3 yr.   Sleep apnea     only uses CPAP machine 3 x/week   Medial meniscus tear 07/2013    right   Cervical muscle strain 07/2013    current Prednisone taper   Dental crowns present     x 2 upper front   Genital herpes simplex type 2    Herniated disc, cervical    Past Surgical History  Procedure Laterality Date   Abdominal hysterectomy      partial   Cesarean section      x 2   Nasal septum surgery     Carpal tunnel release  07/29/2012    Procedure: CARPAL TUNNEL RELEASE;  Surgeon: Denise Manges, MD;  Location: [facility];  Service: Orthopedics;   Laterality: Right;  Right carpal tunnel release   Laparoscopic appendectomy  02/21/2006   Laparoscopic lysis of adhesions  02/21/2006   Cholecystectomy  04/05/2010   Ercp w/ metal stent placement  03/14/2011   Cystoscopy w/ ureteral stent placement Right 06/11/2007   Nasal septoplasty w/ turbinoplasty Bilateral 07/26/2009    bilat. turb. reduction   Knee arthroscopy Left     x 3   Knee arthroscopy with medial menisectomy Right 07/31/2013    Procedure: KNEE ARTHROSCOPY WITH PARTIAL LATERAL MENISECTOMY, CHONDROPLASTY;  Surgeon: Denise Ave, MD;  Location: [facility];  Service: Orthopedics;  Laterality: Right;   Allergies  Allergen Reactions   Imitrex [Sumatriptan Base] Shortness Of Breath and Other (See Comments)    TACHYCARDIA   Trazodone And Nefazodone Other (See Comments)    NIGHTMARES   Effexor [Venlafaxine] Other (See Comments)    MAKES HER "JITTERY"   Metoclopramide Hcl Itching   Morphine And Related Itching   Tegretol [Carbamazepine] Itching   Topamax Itching   History   Social History   Marital Status: Significant Other    Spouse Name: [name]    Number of Children: 2   Years  of Education: N/A   Occupational History   CNA    Social History Main Topics   Smoking status: Former Smoker    Quit date: 06/05/2013   Smokeless tobacco: Never Used   Alcohol Use: No     Comment: none since 2004   Drug Use: No     Comment: none since 2011   Sexual Activity: Yes    Partners: Female   Other Topics Concern   Not on file   Social History Narrative   3 caffeine drinks daily       Was recruited to [employer], with a full scholarship, for undergraduate studies, but had to decline due to family problems.  She then planned to attend [employer] ([employer]) but wasn't able to work and attend courses, so went to [employer] and studied Production designer, theatre/television/film.      Has dropped out of the substance abuse counseling program she was enrolled in, having  figured out that it's not a career path she wants to continue.  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                     Review of Systems  Constitutional: Negative for fever and chills.  HENT: Positive for trouble swallowing.   Respiratory: Negative for cough, chest tightness, shortness of breath and wheezing.   Cardiovascular: Negative for chest pain and palpitations.  Musculoskeletal:       Foot/leg pain  Skin: Positive for color change.  Neurological: Negative for light-headedness and headaches.       Objective:   Physical Exam Filed Vitals:   09/14/13 0832  BP: 120/76  Pulse: 90  Temp: 98 F (36.7 C)  TempSrc: Oral  Resp: 16  Height: 5\' 6"  (1.676 m)  Weight: 195 lb (88.451 kg)  SpO2: 99%  HEENT clear HT reg Lungs clear R leg with mid cal 1cm> than L Tender to palp thru out calf from achilles to popliteal/no defect/Homan's negative/full pulses distally No edema Mild redness ant lower shin    Results for orders placed in visit on 09/14/13  POCT CBC      Result Value Range   WBC 5.8  4.6 - 10.2 K/uL   Lymph, poc 3.2  0.6 - 3.4   POC LYMPH PERCENT 54.5 (*) 10 - 50 %L   MID (cbc) 0.5  0 - 0.9   POC MID % 8.9  0 - 12 %M   POC Granulocyte 2.1  2 - 6.9   Granulocyte percent 36.6 (*) 37 - 80 %G   RBC 4.41  4.04 - 5.48 M/uL   Hemoglobin 12.9  12.2 - 16.2 g/dL   HCT, POC 47.8  29.5 - 47.9 %   MCV 93.8  80 - 97 fL   MCH, POC 29.3  27 - 31.2 pg   MCHC 31.2 (*) 31.8 - 35.4 g/dL   RDW, POC 62.1     Platelet Count, POC 280  142 - 424 K/uL   MPV 7.9  0 - 99.8 fL       Assessment & Plan:    Right leg pain -with swelling and recent surgery  Cellulitis - vs DVT  To cone for doppler now   I have completed the patient encounter in its entirety as documented by the scribe, with editing by me where necessary. [name], M.D.  Addendum 2pm=doppler normal Home to treat with continued augmentin/elevation Reck 3 d if not responding

## 2013-09-16 ENCOUNTER — Ambulatory Visit (INDEPENDENT_AMBULATORY_CARE_PROVIDER_SITE_OTHER): Payer: 59 | Admitting: Physician Assistant

## 2013-09-16 VITALS — BP 108/76 | HR 100 | Temp 98.1°F | Resp 18 | Ht 65.0 in | Wt 194.2 lb

## 2013-09-16 DIAGNOSIS — L0291 Cutaneous abscess, unspecified: Secondary | ICD-10-CM

## 2013-09-16 DIAGNOSIS — L0390 Cellulitis, unspecified: Secondary | ICD-10-CM

## 2013-09-16 DIAGNOSIS — M542 Cervicalgia: Secondary | ICD-10-CM

## 2013-09-16 DIAGNOSIS — I10 Essential (primary) hypertension: Secondary | ICD-10-CM

## 2013-09-16 DIAGNOSIS — M79604 Pain in right leg: Secondary | ICD-10-CM

## 2013-09-16 DIAGNOSIS — M79609 Pain in unspecified limb: Secondary | ICD-10-CM

## 2013-09-16 MED ORDER — DOXYCYCLINE HYCLATE 100 MG PO CAPS: 100.0000 mg | ORAL_CAPSULE | Freq: Two times a day (BID) | ORAL | Status: DC

## 2013-09-16 MED ORDER — AMLODIPINE BESYLATE 10 MG PO TABS: 10.0000 mg | ORAL_TABLET | Freq: Every morning | ORAL | Status: DC

## 2013-09-16 MED ORDER — DIAZEPAM 10 MG PO TABS: 10.0000 mg | ORAL_TABLET | Freq: Four times a day (QID) | ORAL | Status: DC | PRN

## 2013-09-16 NOTE — Patient Instructions (Signed)
Keep your legs elevated as much as you can.  If you do not have improvement over the next 48 hours, or if your symptoms worsen, return for re-evaluation.

## 2013-09-16 NOTE — Progress Notes (Signed)
  Subjective:    Patient ID: [name], female    DOB: 01-24-1971, 42 y.o.   MRN: [id number]  Leg Pain    This 42 y.o. female presents for evaluation of persistent erythema and edema of the RIGHT lower leg, now with development of milder but similar symptoms on the LEFT.  She was seen 09/13/2013 here by Dr. Merla Riches and noted that she'd taken several doses of leftover Augmentin, with some improvement.  She had an emergent LE doppler that was negative for DVT and for superficial phlebitis.  Unfortunately, she's had worsening of her symptoms.  She reports increased erythema and edema on the RIGHT and development of similar erythema on the LEFT. There is more itching today, and she has tenderness with palpation, mild pain with walking/standing.  Additionally, she needs a refill of valium, for treatment of persistent neck pain and norvasc, for treatment of hypertension.  Medications, allergies, past medical history, surgical history, family history, social history and problem list reviewed.  She is accompanied by her partner, [name].   Review of Systems As above.    Objective:   Physical Exam  Vitals reviewed. Constitutional: She is oriented to person, place, and time. She appears well-developed and well-nourished. No distress.  Eyes: Conjunctivae are normal.  Cardiovascular: Normal rate.   Pulmonary/Chest: Effort normal.  Musculoskeletal:       Right lower leg: She exhibits tenderness and edema (1-2+ pitting of the RIGHT foot and lower leg, mild non-pitting of the erythematous area on the LEFT). She exhibits no bony tenderness, no deformity and no laceration.       Legs: Neurological: She is alert and oriented to person, place, and time.  Skin: Skin is warm and dry. Rash: areas outlined above are notable to very fine vasculitic-type rash. There is erythema.  Psychiatric: She has a normal mood and affect. Her behavior is normal. Thought content normal.          Assessment &  Plan:  Right leg pain, Cellulitis - Plan: doxycycline (VIBRAMYCIN) 100 MG capsule.  RTC if no significant improvement in the next 48 hours, sooner if worsening. Elevate legs as much as possible.  Cervical pain - Plan: diazepam (VALIUM) 10 MG tablet  Hypertension - Plan: amLODipine (NORVASC) 10 MG tablet  [name], PA-C Physician Assistant-Certified [facility]

## 2013-09-25 ENCOUNTER — Telehealth: Payer: Self-pay

## 2013-09-25 DIAGNOSIS — M542 Cervicalgia: Secondary | ICD-10-CM

## 2013-09-25 MED ORDER — DIAZEPAM 10 MG PO TABS: 10.0000 mg | ORAL_TABLET | Freq: Four times a day (QID) | ORAL | Status: DC | PRN

## 2013-09-25 NOTE — Telephone Encounter (Signed)
Pharm requests Rf of diazepam 10 mg

## 2013-09-25 NOTE — Telephone Encounter (Signed)
I have pended 1 RF for [name]'s review.

## 2013-09-25 NOTE — Telephone Encounter (Signed)
Called in Rx

## 2013-09-25 NOTE — Telephone Encounter (Signed)
Authorized.  Please phone in. Meds ordered this encounter  Medications  . diazepam (VALIUM) 10 MG tablet    Sig: Take 1 tablet (10 mg total) by mouth every 6 (six) hours as needed (muscle spasms).    Dispense:  30 tablet    Refill:  0    Order Specific Question:  Supervising Provider    Answer:  [name] [[id number]]

## 2013-09-26 ENCOUNTER — Encounter: Payer: Self-pay | Admitting: Physician Assistant

## 2013-09-26 ENCOUNTER — Telehealth: Payer: Self-pay | Admitting: Radiology

## 2013-09-26 ENCOUNTER — Ambulatory Visit (INDEPENDENT_AMBULATORY_CARE_PROVIDER_SITE_OTHER): Payer: 59 | Admitting: Physician Assistant

## 2013-09-26 VITALS — BP 128/82 | HR 118 | Temp 99.3°F | Resp 17 | Ht 67.0 in | Wt 190.0 lb

## 2013-09-26 DIAGNOSIS — Z79899 Other long term (current) drug therapy: Secondary | ICD-10-CM

## 2013-09-26 DIAGNOSIS — IMO0002 Reserved for concepts with insufficient information to code with codable children: Secondary | ICD-10-CM

## 2013-09-26 DIAGNOSIS — Z9114 Patient's other noncompliance with medication regimen: Secondary | ICD-10-CM

## 2013-09-26 DIAGNOSIS — M542 Cervicalgia: Secondary | ICD-10-CM

## 2013-09-26 MED ORDER — OXYCODONE HCL 30 MG PO TABS: 30.0000 mg | ORAL_TABLET | ORAL | Status: DC | PRN

## 2013-09-26 NOTE — Progress Notes (Signed)
Patient ID: ADEJA SARRATT MRN: [id number], DOB: 01-29-1971, 42 y.o. Date of Encounter: 09/26/2013, 11:03 AM  Primary Physician: JEFFERY,[name], PA-C  Chief Complaint: Medication refill  HPI: 42 y.o. female with history below presents for medication refill. Patient states since changing from Percocet 10/325 q 4 hours to Roxicodone 30 mg q 4 hours she has had to take her medication more frequently secondary to her cervical pain. Her last refill of Roxicodone 30 mg q 4 hours #180 tabs was 09/08/13 making her not due for this until 10/08/13. At the current date this would put her at taking 12 tabs per day instead of her prescribed 6 tabs per day. She is accompanied by her partner.    Cellulitis improving along the right lower leg.    Past Medical History  Diagnosis Date  . Anxiety   . GERD (gastroesophageal reflux disease)   . History of alcohol abuse     no alcohol since 2004  . Migraine headache   . History of cocaine abuse     none since 2011, entered treatment  . History of kidney stones   . Arthritis     left knee  . Depression   . Complication of anesthesia 03/2011    history of aspiration after anesthesia  . History of esophageal dilatation     multiple  . History of pancreatitis 2012  . Hypertension     under control with med., has been on med. x 3 yr.  . Sleep apnea     only uses CPAP machine 3 x/week  . Medial meniscus tear 07/2013    right  . Cervical muscle strain 07/2013    current Prednisone taper  . Dental crowns present     x 2 upper front  . Genital herpes simplex type 2   . Herniated disc, cervical      Home Meds: Prior to Admission medications   Medication Sig Start Date End Date Taking? Authorizing Provider  amLODipine (NORVASC) 10 MG tablet Take 1 tablet (10 mg total) by mouth every morning. 09/16/13  Yes [name] S Jeffery, PA-C  amoxicillin-clavulanate (AUGMENTIN) 875-125 MG per tablet Take 1 tablet by mouth 2 (two) times daily. 09/14/13  Yes Tonye Pearson, MD  amphetamine-dextroamphetamine (ADDERALL) 30 MG tablet Take 0.5 tablets (15 mg total) by mouth 3 (three) times daily. 09/08/13  Yes [name] S Jeffery, PA-C  cloNIDine (CATAPRES) 0.1 MG tablet Take 0.1 mg by mouth 3 (three) times daily.   Yes Historical Provider, MD  diazepam (VALIUM) 10 MG tablet Take 1 tablet (10 mg total) by mouth every 6 (six) hours as needed (muscle spasms). 09/25/13  Yes [name] S Jeffery, PA-C  doxycycline (VIBRAMYCIN) 100 MG capsule Take 1 capsule (100 mg total) by mouth 2 (two) times daily. 09/16/13  Yes [name] S Jeffery, PA-C  EPINEPHrine (EPIPEN) 0.3 mg/0.3 mL SOAJ injection Inject 0.3 mg into the muscle once as needed (Anaphylaxis).    Yes Historical Provider, MD  ibuprofen (ADVIL,MOTRIN) 200 MG tablet Take 600 mg by mouth 2 (two) times daily as needed for pain.   Yes Historical Provider, MD  ondansetron (ZOFRAN) 4 MG tablet Take 1 tablet (4 mg total) by mouth every 6 (six) hours. 08/31/13  Yes Trevor Mace, PA-C  oxycodone (ROXICODONE) 30 MG immediate release tablet Take 1 tablet (30 mg total) by mouth every 4 (four) hours as needed for pain. 09/26/13  Yes Lottie Siska M Korra Christine, PA-C  pantoprazole (PROTONIX) 40 MG tablet Take 40 mg  by mouth at bedtime.    Yes Historical Provider, MD  promethazine (PHENERGAN) 25 MG tablet Take 25 mg by mouth every 6 (six) hours as needed for nausea.   Yes Historical Provider, MD  sertraline (ZOLOFT) 100 MG tablet Take 100 mg by mouth at bedtime.   Yes Historical Provider, MD    Allergies:  Allergies  Allergen Reactions  . Imitrex [Sumatriptan Base] Shortness Of Breath and Other (See Comments)    TACHYCARDIA  . Trazodone And Nefazodone Other (See Comments)    NIGHTMARES  . Effexor [Venlafaxine] Other (See Comments)    MAKES HER "JITTERY"  . Metoclopramide Hcl Itching  . Morphine And Related Itching  . Tegretol [Carbamazepine] Itching  . Topamax Itching    History   Social History  . Marital Status: Significant Other     Spouse Name: Dustin Flock    Number of Children: 2  . Years of Education: N/A   Occupational History  . CNA    Social History Main Topics  . Smoking status: Former Smoker    Quit date: 06/05/2013  . Smokeless tobacco: Never Used  . Alcohol Use: No     Comment: none since 2004  . Drug Use: No     Comment: none since 2011  . Sexual Activity: Yes    Partners: Female   Other Topics Concern  . Not on file   Social History Narrative   3 caffeine drinks daily       Was recruited to [employer], with a full scholarship, for undergraduate studies, but had to decline due to family problems.  She then planned to attend [employer] ([employer]) but wasn't able to work and attend courses, so went to [employer] and studied Production designer, theatre/television/film.      Has dropped out of the substance abuse counseling program she was enrolled in, having figured out that it's not a career path she wants to continue.  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                    Review of Systems: Constitutional: negative for chills, fever, or fatigue  Per above  Physical Exam: Blood pressure 128/82, pulse 118, temperature 99.3 F (37.4 C), temperature source Oral, resp. rate 17, height 5\' 7"  (1.702 m), weight 190 lb (86.183 kg), SpO2 96.00%., Body mass index is 29.75 kg/(m^2). General: Well developed, well nourished, in no acute distress. Head: Normocephalic, atraumatic, eyes without discharge, sclera non-icteric, nares are without discharge.   Neck: Supple. Full ROM.  Lungs: Breathing is unlabored. Heart: Regular rate. Abdomen: Soft, non-tender, non-distended with normoactive bowel sounds. No hepatomegaly. No rebound/guarding. No obvious abdominal masses. Msk:  Strength and tone normal for age. Extremities/Skin: Warm and dry. No clubbing or cyanosis. No edema. Cellulitis of the right lower leg improving.  Neuro: Alert and oriented X 3. Moves all extremities spontaneously. Gait is normal. CNII-XII grossly in  tact. Psych:  Responds to questions appropriately with a normal affect.     ASSESSMENT AND PLAN:  42 y.o. female with chronic cervical pain, continuous medication over use, medication management improving cellulitis   1) Chronic cervical pain/continuous medication over use/medication management  -The patient needs to follow up with her PCP for all further controlled medications. I will no longer write any narcotics for her. She must follow [facility] policy.  -The patient is taking entirely too many oxycodone -I originally wrote her for oxycodone 30 mg 1 po q 4 hours #24 no RF to  get her through the weekend without withdrawing; however the pharmacist does not want to fill this early and I agree with this. So this prescription was voided entirely by [facility] pharmacy.  -She was informed that she was denied by pain management clinic, follow up to be determined by PCP -The patient's partner asked inappropriately if my wife "Dayna" had left for work this morning before I did. I believe she is a Engineer, civil (consulting) and may have seen my wife at [address] previously; however I have never met the patient's partner before nor, do not believe this is constructive to the patient's overall care   -Patient called while typing up note asking for her oxycodone. Our decision again was told to her that it was final. Her partner then got on the phone and this was explained to her by Jozy Littrell. She asked if she could come in to discuss this. I will not go back into an OV with this patient today for the same CC as above. She has been given ER precautions.  -She also asks for her Valium refill, review of her chart indicates this was authorized on 09/25/13   2) Cellulitis right lower leg -Improving   Signed, Eula Listen, PA-C [facility] [place], [place] [address] [phone] 09/26/2013 11:03 AM

## 2013-09-26 NOTE — Telephone Encounter (Signed)
I am out of the office until 09/29/2013. If my colleague, Eula Listen, believes that an early fill is not appropriate, I support his decision, and also support the advice given to the patient that she go to the ED with symptoms she is experiencing. While it is true that the patient and I are working to control her pain until she can get in to see Pain Management, it is not appropriate for the patient to increase the dose of her medication without discussing it with me, for this very reason.   Additionally, if she's still having that much pain from the cellulitis, THAT needs re-evaluation.  It should have completely resolved by now and should not be causing any pain.

## 2013-09-26 NOTE — Telephone Encounter (Signed)
Patient indicates she is going to go through withdrawals if she does not get her medications today.Alycia Rossetti has discussed with the pharmacy and it is too early for her to renew.  Patient further states she has been told I am not supposed to handle any medication requests from her. I advised her I can certainly send a message to [name], but [name] is not in the office until Monday. Patient asking to speak directly to [facility]. I have advised her that Alycia Rossetti has made the decision, and will not change his mind. I have advised patient if she has any withdrawal symptoms she should go to the Emergency Room. Patient is very upset with situation. Patient has also asked if she can come in to see Ryan. I have indicated again, Alycia Rossetti will not change his mind regarding this situation. All of the patients medications MUST come from [name] only. Patient requesting I contact [name] today, advised further I will not call [name] today, she will be in the office on Monday and will get the message then.

## 2013-09-26 NOTE — Telephone Encounter (Signed)
Patient was not having pain from cellulitis. Cellulitis was almost fully resolved.

## 2013-09-26 NOTE — Telephone Encounter (Signed)
Thanks. She has been advised.

## 2013-09-29 ENCOUNTER — Ambulatory Visit (INDEPENDENT_AMBULATORY_CARE_PROVIDER_SITE_OTHER): Payer: 59 | Admitting: Physician Assistant

## 2013-09-29 VITALS — BP 102/70 | HR 117 | Temp 98.1°F | Resp 18 | Ht 66.0 in | Wt 188.0 lb

## 2013-09-29 DIAGNOSIS — F1920 Other psychoactive substance dependence, uncomplicated: Secondary | ICD-10-CM

## 2013-09-29 DIAGNOSIS — G894 Chronic pain syndrome: Secondary | ICD-10-CM

## 2013-09-29 DIAGNOSIS — M5412 Radiculopathy, cervical region: Secondary | ICD-10-CM

## 2013-09-29 MED ORDER — OXYCODONE HCL 20 MG PO TABS: 40.0000 mg | ORAL_TABLET | ORAL | Status: DC

## 2013-09-29 NOTE — Addendum Note (Signed)
Addended by: [name] on: 09/29/2013 04:34 PM   Modules accepted: Orders

## 2013-09-29 NOTE — Progress Notes (Signed)
[name]  [place], [place] [address]  [phone]  [email or web address]  Subjective:    Patient ID: [name], female    DOB: [birth year], 42 y.o.   MRN: [id number]  HPI  [name] presents to discuss management of her chronic pain and recent over use of the pain medication I prescribed.  We are waiting to hear back from Pain Management regarding the referral I made several weeks ago.  She has a history of EtOH and illicit abuse and was previously on suboxone.  She had successfully weaned herself down to Percocet 10 mg TID when she had knee surgery for a meniscal tear and then developed severe LEFT neck pain and cervical radiculopathy and her need for medication increased.  At her visit 09/08/2013 we switched her to oxycodone 30 mg Q4 hours.  She had a cellulitis of the lower extremities, that was non-responsive to the initial antibiotic and then slow to respond to the second antibiotic.  During that time, she was taking an extra 4 tablets each day, reportedly by supplementing the 30 mg Q4 hours with 15 mg half-way between each scheduled dose.  Unfortunately, she did not communicate with me and ran out of meds last week.  She presented 09/26/2013 and saw one of my colleagues in my absence.  He agreed to provide a supply for the weekend, but the pharmacy refused to dispense the early fill. She's been experiencing nausea, vomiting, diarrhea, fatigue, somnolence, irritability, insomnia and pain since Friday.  Medications, allergies, past medical history, surgical history, family history, social history and problem list reviewed.  She is willing to see a substance abuse counselor, but not readily willing to attend meetings.  Her experience previously was that attendees are "a bad crowd" and that some folks sell at the meetings.  When she's gone in the past, it's been her access to substances.  If she could be assured that the attendees of a particular meeting were "good," she'd be happy to attend.  She is  accompanied by her partner, [name].  Review of Systems As above.    Objective:   Physical Exam  Blood pressure 102/70, pulse 117, temperature 98.1 F (36.7 C), temperature source Oral, resp. rate 18, height 5\' 6"  (1.676 m), weight 188 lb (85.276 kg), SpO2 100.00%. Body mass index is 30.36 kg/(m^2). Well-developed, well nourished WF who is awake, alert and oriented, in NAD. HEENT: [place]/AT, sclera and conjunctiva are clear.   Heart: RRR, no murmur Lungs: normal effort, CTA Extremities: no cyanosis, clubbing or edema. Skin: warm and dry without rash. Psychologic: good mood and appropriate affect, normal speech and behavior.  Back and knee not re-examined today.     Assessment & Plan:  Cervical radiculopathy  Chronic pain disorder - Plan: Ambulatory referral to Pain Clinic, Oxycodone HCl 20 MG TABS  I will contact the pain management center to find out what is happening with the referral.  Also, will try to locate the substance abuse counselor she's seen previously.  If I can't find her, will plan to refer to the [facility] for counseling. RTC 2 weeks. In the meantime, increase the oxycodone to 40 mg Q4 hours. 14-day supply provided. If her pain is not adequately controlled, she will contact me before increasing the dose.  Discussed that managing pain and substance abuse is extremely difficult, and that she really needs to see specialists for this.  Discussed with Dr. Cleta Alberts.  [name], PA-C Physician Assistant-Certified [facility]   pain management/substance abuse counseling

## 2013-10-06 ENCOUNTER — Other Ambulatory Visit: Payer: Self-pay | Admitting: Physician Assistant

## 2013-10-07 ENCOUNTER — Telehealth: Payer: Self-pay

## 2013-10-07 NOTE — Telephone Encounter (Signed)
PT STATES SHE CALLED HER PHARMACY FOR A REFILL ON HER VALIUM AND SHE WANTED [name] TO KNOW PLEASE CALL [phone]    [facility]

## 2013-10-08 ENCOUNTER — Other Ambulatory Visit: Payer: Self-pay

## 2013-10-08 NOTE — Telephone Encounter (Signed)
Done

## 2013-10-10 ENCOUNTER — Ambulatory Visit (INDEPENDENT_AMBULATORY_CARE_PROVIDER_SITE_OTHER): Payer: 59 | Admitting: Physician Assistant

## 2013-10-10 VITALS — BP 114/68 | HR 113 | Temp 98.8°F | Resp 17 | Ht 67.5 in | Wt 191.0 lb

## 2013-10-10 DIAGNOSIS — F411 Generalized anxiety disorder: Secondary | ICD-10-CM

## 2013-10-10 DIAGNOSIS — G894 Chronic pain syndrome: Secondary | ICD-10-CM

## 2013-10-10 DIAGNOSIS — M545 Low back pain, unspecified: Secondary | ICD-10-CM

## 2013-10-10 DIAGNOSIS — M25522 Pain in left elbow: Secondary | ICD-10-CM

## 2013-10-10 DIAGNOSIS — M25529 Pain in unspecified elbow: Secondary | ICD-10-CM

## 2013-10-10 DIAGNOSIS — F419 Anxiety disorder, unspecified: Secondary | ICD-10-CM

## 2013-10-10 DIAGNOSIS — F513 Sleepwalking [somnambulism]: Secondary | ICD-10-CM

## 2013-10-10 DIAGNOSIS — G4700 Insomnia, unspecified: Secondary | ICD-10-CM

## 2013-10-10 DIAGNOSIS — M2550 Pain in unspecified joint: Secondary | ICD-10-CM

## 2013-10-10 LAB — POCT CBC
Granulocyte percent: 66.3 %G (ref 37–80)
HCT, POC: 44.9 % (ref 37.7–47.9)
Lymph, poc: 2.2 (ref 0.6–3.4)
MCH, POC: 28.7 pg (ref 27–31.2)
MCHC: 31.2 g/dL — AB (ref 31.8–35.4)
MCV: 92.1 fL (ref 80–97)
MID (cbc): 0.4 (ref 0–0.9)
POC LYMPH PERCENT: 28.7 %L (ref 10–50)
POC MID %: 5 %M (ref 0–12)
RDW, POC: 13.2 %
WBC: 7.6 10*3/uL (ref 4.6–10.2)

## 2013-10-10 LAB — POCT SEDIMENTATION RATE: POCT SED RATE: 28 mm/hr — AB (ref 0–22)

## 2013-10-10 LAB — TSH: TSH: 3.314 u[IU]/mL (ref 0.350–4.500)

## 2013-10-10 MED ORDER — DIAZEPAM 10 MG PO TABS: 10.0000 mg | ORAL_TABLET | Freq: Every evening | ORAL | Status: DC | PRN

## 2013-10-10 MED ORDER — MELOXICAM 15 MG PO TABS: 15.0000 mg | ORAL_TABLET | Freq: Every day | ORAL | Status: DC

## 2013-10-10 MED ORDER — OXYCODONE HCL 20 MG PO TABS: 40.0000 mg | ORAL_TABLET | ORAL | Status: DC

## 2013-10-10 NOTE — Patient Instructions (Signed)
Take the meloxicam every day.

## 2013-10-10 NOTE — Progress Notes (Signed)
Subjective:    Patient ID: [name], female    DOB: 1971/10/10, 42 y.o.   MRN: [id number]  Chief Complaint  Patient presents with  . Medication Refill    HPI Her next prescription fills for Valium and Oxycodone are due Monday.  She is leaving town [employer] account [employer] and won't be back until Wend[place]day, and since her last visit has taken a few extra pain pills than prescribed.  Her partner, "[name] has caught me going to get more pills, when I'd just taken them.  I woke up with a handful of [my daughter] Stevie's Zofran, standing in the kitchen, getting ready to take them.  Another time with a dog biscuit." Appears to be sleep walking. Has a history of sleep walking, witnessed by her family members.  She keeps her medications in the bedroom. Other family members keep theirs in the kitchen. It doesn't appear that she's taken or attempted to take anyone else's medications, other than the Zofran. [name] will lock her pills again, and dispense them as prescribed, but she's concerned.  Her neck pain has improved, but she still has elbow pain. She was supposed to have a follow-up with orthopedics regarding that, but has been experiencing a new/different generalized pain and fatigue, "like the flu." Joint pain, all over, not just the LEFT elbow. No fever, chills, coughing, sinus congestion, sore throat. No nausea/vomiting or diarrhea. No redness or swelling. She had a mild itchy rash on her arms for a few days, but it's not clear that's related at all, and it's resolving. Worked on 10/06/2013, but hasn't been able to return this week due to the pain.  Has been staying in bed.  Is concerned that she'll lose her job.  Hasn't been taking the meloxicam due to fear about it's use (thought it was Vioxx). She would like to switch back to alprazolam or clonazepam for sleep, but then notes that the doses of those she took previously weren't effective.  The Valium was helpful for sleep, but she no longer needs it during  the day.  Review of Systems As above, plus her chronic back pain.    Objective:   Physical Exam Blood pressure 114/68, pulse 113, temperature 98.8 F (37.1 C), temperature source Oral, resp. rate 17, height 5' 7.5" (1.715 m), weight 191 lb (86.637 kg), SpO2 98.00%. Body mass index is 29.46 kg/(m^2). Well-developed, well nourished WF who is awake, alert and oriented, in NAD. HEENT: [place]/AT, PERRL, EOMI.  Sclera and conjunctiva are clear.  EAC are patent, TMs are normal in appearance. Nasal mucosa is pink and moist. OP is clear. Neck: supple, non-tender, no lymphadenopathy, thyromegaly. Heart: RRR, no murmur Lungs: normal effort, CTA Extremities: no cyanosis, clubbing or edema. No joint swelling, erythema.  She is generally tender all over. Skin: warm and dry with what appear to be resolving erythematous papules on the elbows, R>L. Psychologic: good mood and appropriate affect, normal speech and behavior.        Assessment & Plan:  1. Joint pain Viral syndrome?  New connective tissue disorder? - meloxicam (MOBIC) 15 MG tablet; Take 1 tablet (15 mg total) by mouth daily.  Dispense: 30 tablet; Refill: 5 - POCT CBC - POCT SEDIMENTATION RATE - TSH - C-reactive protein  2. Elbow pain, left Cervical pain resolved. Radiculopathy to elbow persists. Follow up with ortho when able to reschedule. - meloxicam (MOBIC) 15 MG tablet; Take 1 tablet (15 mg total) by mouth daily.  Dispense: 30 tablet; Refill: 5  3. Anxiety Primarily prevents sleep. See Below.  4. Lumbar back pain See below - meloxicam (MOBIC) 15 MG tablet; Take 1 tablet (15 mg total) by mouth daily.  Dispense: 30 tablet; Refill: 5  5. Chronic pain disorder We're waiting on referrals to Pain Management and Addiction Medicine. - Oxycodone HCl 20 MG TABS; Take 2 tablets (40 mg total) by mouth every 4 (four) hours. This is early fill.  Patient will be out of town when due to fill.  Dispense: 180 tablet; Refill: 0 - meloxicam  (MOBIC) 15 MG tablet; Take 1 tablet (15 mg total) by mouth daily.  Dispense: 30 tablet; Refill: 5  6. Insomnia Previously used Alprazolam, Alprazolam XR, Clonazepam and vistaril.  All worked initially, then became ineffective. - diazepam (VALIUM) 10 MG tablet; Take 1 tablet (10 mg total) by mouth at bedtime as needed for anxiety or sleep. This is early fill.  Patient will be out of town when due to fill.  Dispense: 30 tablet; Refill: 0  7. Sleep walking Await labs.  May need neurology evaluation.  Has OSA and uses CPAP-may need re-evaluation.

## 2013-10-23 ENCOUNTER — Encounter: Payer: Self-pay | Admitting: Physician Assistant

## 2013-10-23 ENCOUNTER — Other Ambulatory Visit: Payer: Self-pay | Admitting: Physician Assistant

## 2013-10-23 DIAGNOSIS — G894 Chronic pain syndrome: Secondary | ICD-10-CM

## 2013-10-23 DIAGNOSIS — G4700 Insomnia, unspecified: Secondary | ICD-10-CM

## 2013-10-23 MED ORDER — OXYCODONE HCL 20 MG PO TABS: 40.0000 mg | ORAL_TABLET | ORAL | Status: DC

## 2013-10-23 MED ORDER — ALPRAZOLAM ER 1 MG PO TB24: 1.0000 mg | ORAL_TABLET | Freq: Every evening | ORAL | Status: DC | PRN

## 2013-10-23 NOTE — Telephone Encounter (Signed)
Meds ordered this encounter  Medications  . Oxycodone HCl 20 MG TABS    Sig: Take 2 tablets (40 mg total) by mouth every 4 (four) hours.    Dispense:  180 tablet    Refill:  0    This is a 14 day supply    Order Specific Question:  Supervising Provider    Answer:  DOOLITTLE, ROBERT P [[id number]]  . ALPRAZolam (XANAX XR) 1 MG 24 hr tablet    Sig: Take 1-2 tablets (1-2 mg total) by mouth at bedtime as needed for anxiety or sleep.    Dispense:  60 tablet    Refill:  0    Order Specific Question:  Supervising Provider    Answer:  DOOLITTLE, ROBERT P [[id number]]

## 2013-10-23 NOTE — Telephone Encounter (Signed)
[name], I see in last OV notes that pt has used this med in the past for insomnia and it became ineffective, then email to you today and you wrote for Xanax. Do you want her on this also? Don't see it on current med list.

## 2013-10-27 ENCOUNTER — Ambulatory Visit (INDEPENDENT_AMBULATORY_CARE_PROVIDER_SITE_OTHER): Payer: 59 | Admitting: Physician Assistant

## 2013-10-27 VITALS — BP 142/88 | HR 103 | Temp 97.8°F | Resp 18 | Ht 65.75 in | Wt 196.2 lb

## 2013-10-27 DIAGNOSIS — F988 Other specified behavioral and emotional disorders with onset usually occurring in childhood and adolescence: Secondary | ICD-10-CM

## 2013-10-27 DIAGNOSIS — E669 Obesity, unspecified: Secondary | ICD-10-CM | POA: Insufficient documentation

## 2013-10-27 DIAGNOSIS — R52 Pain, unspecified: Secondary | ICD-10-CM

## 2013-10-27 DIAGNOSIS — Z23 Encounter for immunization: Secondary | ICD-10-CM

## 2013-10-27 MED ORDER — AMPHETAMINE-DEXTROAMPHETAMINE 30 MG PO TABS: 15.0000 mg | ORAL_TABLET | Freq: Three times a day (TID) | ORAL | Status: DC

## 2013-10-27 NOTE — Patient Instructions (Signed)
If you have not heard anything regarding the referral in 7-10 days, please contact our office.

## 2013-10-27 NOTE — Progress Notes (Signed)
   Subjective:    Patient ID: [name], female    DOB: 03-13-1971, 42 y.o.   MRN: [id number]  PCP: Denise Schrodt, PA-C  Chief Complaint  Patient presents with  . Knee Pain    knees, elbow and ankles couple of months    HPI  Is still having generalized joint pain.  Is ready to see rheumatology.  When I saw her 10/10/2013 we updated a TSH (normal) and she had a ESR of 28 and a normal CRP.  RIGHT knee films 01/2013 and 06/2013 were normal. LEFT wrist 01/2013  She's had arthroscopy of the knee and CTS.  Her chronic pain is managed with meloxicam 15 mg daily, Oxycodone 40 mg Q4hours.  We are trying to get her in with pain management and substance abuse counseling. Still having some sleep walking events. Her partner continues to keep possession of the controlled substances and dispenses them to [name] when they are due.  Needs refill of adderall for ADD.  Current dose is working well.  Doesn't take it when she's not working and doesn't need to be as focused (for example, during the week she was out of work, she didn't take it).  Review of Systems No CP, SOB, dizziness. No urinary symptoms.    Objective:   Physical Exam  Vitals reviewed. Constitutional: She is oriented to person, place, and time. She appears well-developed and well-nourished. No distress.  Eyes: Conjunctivae are normal. No scleral icterus.  Neck: No thyromegaly present.  Cardiovascular: Regular rhythm and normal heart sounds.   Pulmonary/Chest: Effort normal.  Musculoskeletal:       Right elbow: She exhibits normal range of motion, no swelling, no effusion, no deformity and no laceration. Tenderness found. Medial epicondyle and olecranon process tenderness noted. No lateral epicondyle tenderness noted.       Left elbow: She exhibits normal range of motion, no swelling, no effusion, no deformity and no laceration. Tenderness found. Lateral epicondyle and olecranon process tenderness noted. No medial epicondyle tenderness  noted.       Right wrist: She exhibits normal range of motion, no tenderness, no bony tenderness and no swelling.       Left wrist: She exhibits normal range of motion, no tenderness, no bony tenderness and no swelling.       Right knee: She exhibits normal range of motion, no swelling and no effusion. Tenderness (generalized) found.       Left knee: She exhibits normal range of motion, no swelling and no effusion. Tenderness (generalized) found.  Lymphadenopathy:    She has no cervical adenopathy.  Neurological: She is alert and oriented to person, place, and time.  Skin: Skin is warm and dry.  Psychiatric: She has a normal mood and affect. Her behavior is normal.          Assessment & Plan:  1. Pain Generalized arthralgias.  Continue meloxicam. - Ambulatory referral to Rheumatology  2. ADD (attention deficit disorder) Stable, controlled. - amphetamine-dextroamphetamine (ADDERALL) 30 MG tablet; Take 0.5 tablets (15 mg total) by mouth 3 (three) times daily.  Dispense: 45 tablet; Refill: 0  3. Need for Tdap vaccination - Tdap vaccine greater than or equal to 7yo IM  She will schedule a breast exam and pap test here or with GYN at her earliest convenience.  Denise Bras, PA-C Physician Assistant-Certified [facility]

## 2013-11-06 ENCOUNTER — Ambulatory Visit (INDEPENDENT_AMBULATORY_CARE_PROVIDER_SITE_OTHER): Payer: 59 | Admitting: Physician Assistant

## 2013-11-06 VITALS — BP 110/74 | HR 95 | Temp 98.3°F | Resp 18 | Ht 66.0 in | Wt 196.6 lb

## 2013-11-06 DIAGNOSIS — M545 Low back pain, unspecified: Secondary | ICD-10-CM

## 2013-11-06 DIAGNOSIS — G894 Chronic pain syndrome: Secondary | ICD-10-CM

## 2013-11-06 DIAGNOSIS — R52 Pain, unspecified: Secondary | ICD-10-CM

## 2013-11-06 MED ORDER — OXYCODONE HCL 20 MG PO TABS: 40.0000 mg | ORAL_TABLET | ORAL | Status: DC

## 2013-11-06 MED ORDER — PREDNISONE 20 MG PO TABS: ORAL_TABLET | ORAL | Status: DC

## 2013-11-06 NOTE — Progress Notes (Signed)
Subjective:    Patient ID: EMMAJANE FRYER, female    DOB: 08-Jul-1971, 43 y.o.   MRN: [id number]  PCP: Cataldo Cosgriff, PA-C  Chief Complaint  Patient presents with  . Medication Refill    Oxycodone   Medications, allergies, past medical history, surgical history, family history, social history and problem list reviewed and updated.  HPI Needs a refill of oxycodone for chronic pain.  Interested in a round of prednisone, "I'm crazy hurting" in the lower back again. Unable to stay up and active longer than 1-2 hours. Last steroid was 07/2013. Has been taking flexeril.  Has an OTC TENS unit.  Using NSAIDS.  Resting. No other worsening-knee pain is stable, neck pain/arm pain isn't recurring.  Referral to rheumatology made for generalized joint aches, waiting on call back. Unsuccessful in getting in with her former substance abuse counselor. Has not been accepted at Pain Management. Is interested in no longer taking chronic pain meds, but also understands that it may not be possible.  Hopes that if underlying rheumatological problem is contributing to her pain that addressing it could significantly improve her symptoms and reduce her need for narcotics.  Her partner, [name], is keeping the medications now, and that's going well for both of them.  [name] puts out the day's pills each morning.  "It's starting to get depressing."  Eating more, and knows that her increased weight isn't helping.  Review of Systems No GI/GU symptoms.  No rash.  No URI-type symptoms.    Objective:   Physical Exam  Constitutional: She is oriented to person, place, and time. Vital signs are normal. She appears well-developed and well-nourished. She is active and cooperative. No distress.  HENT:  Head: Normocephalic and atraumatic.  Right Ear: Hearing normal.  Left Ear: Hearing normal.  Eyes: EOM are normal. Pupils are equal, round, and reactive to light.  Neck: Normal range of motion. Neck supple. No thyromegaly  present.  Cardiovascular: Normal rate, regular rhythm and normal heart sounds.   Pulses:      Radial pulses are 2+ on the right side, and 2+ on the left side.       Posterior tibial pulses are 2+ on the right side, and 2+ on the left side.  Pulmonary/Chest: Effort normal and breath sounds normal.  Musculoskeletal:       Right knee: Normal.       Left knee: Normal.       Cervical back: Normal.       Thoracic back: Normal.       Lumbar back: She exhibits decreased range of motion, tenderness, bony tenderness and pain. She exhibits no spasm.       Back:  Lymphadenopathy:       Head (right side): No tonsillar, no preauricular, no posterior auricular and no occipital adenopathy present.       Head (left side): No tonsillar, no preauricular, no posterior auricular and no occipital adenopathy present.    She has no cervical adenopathy.       Right: No supraclavicular adenopathy present.       Left: No supraclavicular adenopathy present.  Neurological: She is alert and oriented to person, place, and time. She has normal strength. No sensory deficit.  Reflex Scores:      Bicep reflexes are 2+ on the right side and 2+ on the left side.      Patellar reflexes are 2+ on the right side and 2+ on the left side.  Achilles reflexes are 2+ on the right side and 2+ on the left side. Skin: Skin is warm, dry and intact. No rash noted. No cyanosis or erythema. Nails show no clubbing.  Psychiatric: She has a normal mood and affect.          Assessment & Plan:  1. Lumbar back pain 2. Pain 3. Chronic pain disorder Continue efforts to get referrals scheduled with rheumatology, pain management and substance abuse counseling.  - predniSONE (DELTASONE) 20 MG tablet; Take 3 PO QAM x3days, 2 PO QAM x3days, 1 PO QAM x3days  Dispense: 18 tablet; Refill: 0  - Oxycodone HCl 20 MG TABS; Take 2 tablets (40 mg total) by mouth every 4 (four) hours.  Dispense: 180 tablet; Refill: 0   [name],  PA-C Physician Assistant-Certified [facility]

## 2013-11-18 ENCOUNTER — Ambulatory Visit (INDEPENDENT_AMBULATORY_CARE_PROVIDER_SITE_OTHER): Payer: 59 | Admitting: Physician Assistant

## 2013-11-18 ENCOUNTER — Encounter: Payer: Self-pay | Admitting: Physician Assistant

## 2013-11-18 ENCOUNTER — Telehealth: Payer: Self-pay | Admitting: Radiology

## 2013-11-18 VITALS — BP 120/83 | HR 114 | Temp 98.2°F | Resp 20 | Ht 66.0 in | Wt 194.0 lb

## 2013-11-18 DIAGNOSIS — G4700 Insomnia, unspecified: Secondary | ICD-10-CM

## 2013-11-18 DIAGNOSIS — G894 Chronic pain syndrome: Secondary | ICD-10-CM

## 2013-11-18 MED ORDER — OXYCODONE HCL 20 MG PO TABS: ORAL_TABLET | ORAL | Status: DC

## 2013-11-18 MED ORDER — DIAZEPAM 10 MG PO TABS: 10.0000 mg | ORAL_TABLET | Freq: Every evening | ORAL | Status: DC | PRN

## 2013-11-18 MED ORDER — OXYCODONE HCL 15 MG PO TABS: ORAL_TABLET | ORAL | Status: DC

## 2013-11-18 NOTE — Progress Notes (Signed)
Subjective:    Patient ID: [name], female    DOB: [birth year], 43 y.o.   MRN: [id number]  PCP: Xanthe Couillard, PA-C  Chief Complaint  Patient presents with  . Follow-up    2 week  . Medication Refill   Medications, allergies, past medical history, surgical history, family history, social history and problem list reviewed and updated.   HPI  Presents for refills of medications for sleep and chronic pain.  Alprazolam no longer helping for insomnia.  She'd like to re-try valium, which also worked temporarily in the past.  She hasn't heard yet about her appointment with rheumatology (I can see that the referral information has been sent on 10/31/13), and nothing yet regarding the substance abuse referral.  This is really very frustrating.  She'd like to reduce the oxycodone dose to 35 mg Q4 hours (from 40 mg).  I'm running late in clinic today, and she needs to head to work.  Review of Systems     Objective:   Physical Exam  BP 120/83  Pulse 114  Temp(Src) 98.2 F (36.8 C)  Resp 20  Ht 5\' 6"  (1.676 m)  Wt 194 lb (87.998 kg)  BMI 31.33 kg/m2 WDWNWF, A&O x 3.      Assessment & Plan:  1. Chronic pain disorder Spoke with referrals staff again about the referral to rheumatology.  If she has an underlying connective tissue disorder that can be addressed, we may be able to reduce her dependence on narcotics.  She's been declined from multiple pain management clinics. - Oxycodone HCl 20 MG TABS; Take 1 tablet every 4 hours (use with 15 mg tab to achieve 35 mg dose). This is a 14-day supply.  Dispense: 90 tablet; Refill: 0 - oxyCODONE (ROXICODONE) 15 MG immediate release tablet; Take 1 tablet every 4 hours (use with 20 mg tab to achieve 35 mg dose). This is a 14-day supply.  Dispense: 90 tablet; Refill: 0  2. Insomnia Chronic.  Exacerbated by her pain and anxiety. - diazepam (VALIUM) 10 MG tablet; Take 1 tablet (10 mg total) by mouth at bedtime as needed for anxiety or  sleep.  Dispense: 30 tablet; Refill: 0   [name], PA-C Physician Assistant-Certified [facility]

## 2013-11-18 NOTE — Telephone Encounter (Signed)
Patient notified via My Chart

## 2013-11-18 NOTE — Patient Instructions (Signed)
If you have not heard about the referrals by the end of this week, please notify me so that I can keep working on getting it scheduled.

## 2013-11-18 NOTE — Telephone Encounter (Signed)
Called and Dr Corliss Skainseveshwar has indicated she will see patient one visit only, her scheduler Onalee HuaDavid will call patient with the appointment. She will not offer any pain management for patient.

## 2013-11-25 ENCOUNTER — Other Ambulatory Visit: Payer: Self-pay | Admitting: Physician Assistant

## 2013-11-27 ENCOUNTER — Ambulatory Visit (INDEPENDENT_AMBULATORY_CARE_PROVIDER_SITE_OTHER): Payer: 59 | Admitting: Physician Assistant

## 2013-11-27 VITALS — BP 120/66 | HR 63 | Temp 98.3°F | Resp 16 | Ht 66.0 in | Wt 190.6 lb

## 2013-11-27 DIAGNOSIS — M545 Low back pain, unspecified: Secondary | ICD-10-CM

## 2013-11-27 DIAGNOSIS — G894 Chronic pain syndrome: Secondary | ICD-10-CM

## 2013-11-27 MED ORDER — OXYCODONE HCL 30 MG PO TABS: 30.0000 mg | ORAL_TABLET | ORAL | Status: DC

## 2013-11-27 MED ORDER — OXYCODONE HCL 15 MG PO TABS: 15.0000 mg | ORAL_TABLET | ORAL | Status: DC

## 2013-11-27 NOTE — Progress Notes (Signed)
Subjective:    Patient ID: [name], female    DOB: 1971/07/07, 43 y.o.   MRN: [id number]  PCP: Zachari Alberta, PA-C  Chief Complaint  Patient presents with  . Back Pain    worsening   Medications, allergies, past medical history, surgical history, family history, social history and problem list reviewed and updated. Patient Active Problem List   Diagnosis Date Noted  . Obesity (BMI 30-39.9) 10/27/2013  . Neck pain on left side 09/02/2013  . Cervical radiculopathy 08/18/2013  . Chronic pain disorder 08/16/2013  . Drug-seeking behavior 08/16/2013  . Muscle spasm of left shoulder area 08/16/2013  . Lumbar back pain 11/21/2012  . Esophageal stricture 11/21/2012  . ADD (attention deficit disorder) 11/21/2012  . Hypertriglyceridemia 11/21/2012  . Pain 08/06/2012  . Carpal tunnel syndrome, bilateral 07/05/2012  . OSA (obstructive sleep apnea) 06/28/2012  . Constipation 01/01/2012  . Migraine headache 03/02/2011  . Depression 03/02/2011  . History of substance abuse 03/02/2011  . Hypertension 03/02/2011  . Abdominal pain, epigastric 02/20/2011    HPI  Larey Seat in the bathtub on 11/22/2013.  Had increased back pain, so took more than prescribed in hopes that she could control it. Ran out of medication 2-3 days ago and now is experiencing withdrawal.  Still in significant pain-can't get up to let the dogs out to the yard. Has been out of work this week as a result.  Diarrhea, body aches, chills, nausea (has not vomited).  Is changing to have her daughter, [name], hold her medications.  Her partner, [name], was maintaining them, but Chevelle feels like there is some manipulation in her control of the medications.  She is accompanied today by her younger daughter, [name].  Review of Systems As above    Objective:   Physical Exam  Vitals reviewed. Constitutional: She is oriented to person, place, and time. She appears well-developed and well-nourished. She is active and cooperative.  No distress.  Eyes: Conjunctivae are normal. Pupils are equal, round, and reactive to light. Left eye exhibits no discharge. No scleral icterus.  Neck: Neck supple. No thyromegaly present.  Cardiovascular: Normal rate, regular rhythm and normal heart sounds.   Pulmonary/Chest: Effort normal and breath sounds normal.  Musculoskeletal:       Thoracic back: She exhibits tenderness and bony tenderness. She exhibits normal range of motion, no swelling and no edema.       Lumbar back: She exhibits decreased range of motion, tenderness and bony tenderness. She exhibits no swelling and no edema.  Lymphadenopathy:    She has no cervical adenopathy.  Neurological: She is alert and oriented to person, place, and time. She has normal strength. No cranial nerve deficit or sensory deficit.  Skin: Skin is warm and dry.  Psychiatric: She has a normal mood and affect. Her speech is normal and behavior is normal. Thought content normal.  Reasonably good insight into her addiction. Wants to get off narcotics, but needs considerable support and alternative therapy for her pain.          Assessment & Plan:  1. Lumbar back pain 2. Chronic pain disorder Stevie to control her meds from now on.  She'll explore residential substance abuse centers, especially those that aggressively employ alternative therapies (acupuncture, meditation, etc). - oxyCODONE (ROXICODONE) 15 MG immediate release tablet; Take 1 tablet (15 mg total) by mouth every 4 (four) hours. Take with 30 mg to achieve 45 mg dose  Dispense: 90 tablet; Refill: 0 - oxycodone (ROXICODONE) 30 MG  immediate release tablet; Take 1 tablet (30 mg total) by mouth every 4 (four) hours. Take with 15 mg to achieve 45 mg dose  Dispense: 90 tablet; Refill: 0  Return in about 2 weeks (around 12/11/2013).  [name], PA-C Physician Assistant-Certified [facility]

## 2013-11-27 NOTE — Patient Instructions (Signed)
Do not take more medication than prescribed. If you feel that you need to take more than prescribed, please let me know IMMEDIATELY.

## 2013-12-02 ENCOUNTER — Ambulatory Visit: Payer: 59 | Admitting: Physician Assistant

## 2013-12-09 ENCOUNTER — Ambulatory Visit (INDEPENDENT_AMBULATORY_CARE_PROVIDER_SITE_OTHER): Payer: 59 | Admitting: Physician Assistant

## 2013-12-09 VITALS — BP 124/84 | HR 103 | Temp 98.1°F | Resp 16 | Ht 65.75 in | Wt 191.0 lb

## 2013-12-09 DIAGNOSIS — M25529 Pain in unspecified elbow: Secondary | ICD-10-CM

## 2013-12-09 DIAGNOSIS — M545 Low back pain, unspecified: Secondary | ICD-10-CM

## 2013-12-09 DIAGNOSIS — G894 Chronic pain syndrome: Secondary | ICD-10-CM

## 2013-12-09 MED ORDER — OXYCODONE HCL 15 MG PO TABS: 15.0000 mg | ORAL_TABLET | ORAL | Status: DC

## 2013-12-09 MED ORDER — METHOCARBAMOL 750 MG PO TABS: 750.0000 mg | ORAL_TABLET | Freq: Four times a day (QID) | ORAL | Status: DC | PRN

## 2013-12-09 MED ORDER — INDOMETHACIN 50 MG PO CAPS: 50.0000 mg | ORAL_CAPSULE | Freq: Three times a day (TID) | ORAL | Status: DC

## 2013-12-09 MED ORDER — OXYCODONE HCL 30 MG PO TABS: 30.0000 mg | ORAL_TABLET | ORAL | Status: DC

## 2013-12-09 NOTE — Progress Notes (Signed)
Subjective:    Patient ID: [name], female    DOB: Mar 31, 1971, 43 y.o.   MRN: [id number]  PCP: Almas Rake, PA-C  Chief Complaint  Patient presents with  . Medication Refill    oxycodone   Medications, allergies, past medical history, surgical history, family history, social history and problem list reviewed and updated.  HPI  Presents for refill of oxycodone.  She would also like to try Robaxin instead of Flexeril.  Not yet scheduled with Dr. Corliss Skains, but has been contacted. She knows that the visit there will be a one-time evaluation for underlying problem that could be contributing to her pain, and not for pain management.  Wants to stay on her current regimen for 2 more weeks, then reduce dose.  Her daughter, [name], agrees. Things are going well with [name] keeping the medications and putting out each day's dose for Denzil.  LEFT elbow pain has been worsening, RIGHT elbow pain is stable, but still present.  Now even sensitive to light touch. It feels like a rawness of the skin, in addition to the joint pain. Increased pain with movement, especially flexion/extension, and much worse with grasping/lifting. Her partner, [name], thinks that the problem is gout and suggests Indocin or colchicine.  Ibuprofen nor meloxicam have afforded any relief. She has never had gout previously. [name] notes the LEFT elbow is swollen. They deny any redness or increased warmth.  Review of Systems No CP, SOB, HA, dizziness, rash.    Objective:   Physical Exam Blood pressure 124/84, pulse 103, temperature 98.1 F (36.7 C), temperature source Oral, resp. rate 16, height 5' 5.75" (1.67 m), weight 191 lb (86.637 kg), SpO2 100.00%. Body mass index is 31.06 kg/(m^2). Well-developed, well nourished WF who is awake, alert and oriented, in NAD. HEENT: [place]/AT, sclera and conjunctiva are clear.   Neck: supple, non-tender, no lymphadenopathy, thyromegaly. Heart: RRR, no murmur Lungs: normal effort,  CTA Extremities: no cyanosis, clubbing or edema. Minimal swelling of the LEFT elbow. No erythema, ecchymosis or other skin changes.  No increased warmth. No olecranon tenderness or swelling.  Medial and lateral epicondyles are exquisitely tender.  She has full ROM of the LEFT elbow, but with pain with full flexion and full extension.  No pain with pronation/supination of the forearm.  The RIGHT elbow is not swollen, erythematous or with increased warmth. There is tenderness of the medial and lateral epicondyles, but exam is otherwise unremarkable.  Wrists are non-tender and with FROM. Skin: warm and dry without rash. Psychologic: good mood and appropriate affect, normal speech and behavior.        Assessment & Plan:  1. Chronic pain disorder Schedule with Dr. Corliss Skains.  Hope to identify an underlying cause of her pain that when treated, can reduce the need for narcotics.  Continue current treatment for the next 2 weeks, with plans to reduce the dose to 40 mg at that time. - oxyCODONE (ROXICODONE) 15 MG immediate release tablet; Take 1 tablet (15 mg total) by mouth every 4 (four) hours. Take with 30 mg to achieve 45 mg dose  Dispense: 90 tablet; Refill: 0 - oxycodone (ROXICODONE) 30 MG immediate release tablet; Take 1 tablet (30 mg total) by mouth every 4 (four) hours. Take with 15 mg to achieve 45 mg dose  Dispense: 90 tablet; Refill: 0  2. Elbow pain Doubt gout as cause.  See above. - indomethacin (INDOCIN) 50 MG capsule; Take 1 capsule (50 mg total) by mouth 3 (three) times daily with  meals.  Dispense: 30 capsule; Refill: 0  Return in about 2 weeks (around 12/23/2013).   [name], PA-C Physician Assistant-Certified [facility]

## 2013-12-22 ENCOUNTER — Encounter: Payer: Self-pay | Admitting: Physician Assistant

## 2013-12-22 ENCOUNTER — Telehealth: Payer: Self-pay | Admitting: Physician Assistant

## 2013-12-22 DIAGNOSIS — G894 Chronic pain syndrome: Secondary | ICD-10-CM

## 2013-12-22 MED ORDER — OXYCODONE HCL 30 MG PO TABS: 30.0000 mg | ORAL_TABLET | ORAL | Status: DC

## 2013-12-22 MED ORDER — OXYCODONE HCL 15 MG PO TABS: 15.0000 mg | ORAL_TABLET | ORAL | Status: DC

## 2013-12-22 NOTE — Telephone Encounter (Signed)
Rx's printed and given to patient.  Meds ordered this encounter  Medications  . oxyCODONE (ROXICODONE) 15 MG immediate release tablet    Sig: Take 1 tablet (15 mg total) by mouth every 4 (four) hours. Take with 30 mg to achieve 45 mg dose    Dispense:  90 tablet    Refill:  0    Order Specific Question:  Supervising Provider    Answer:  DOOLITTLE, ROBERT P [[id number]]  . oxycodone (ROXICODONE) 30 MG immediate release tablet    Sig: Take 1 tablet (30 mg total) by mouth every 4 (four) hours. Take with 15 mg to achieve 45 mg dose    Dispense:  90 tablet    Refill:  0    Order Specific Question:  Supervising Provider    Answer:  DOOLITTLE, ROBERT P [[id number]]

## 2013-12-30 ENCOUNTER — Other Ambulatory Visit: Payer: Self-pay | Admitting: Physician Assistant

## 2014-01-01 NOTE — Telephone Encounter (Signed)
faxed

## 2014-01-05 ENCOUNTER — Ambulatory Visit (INDEPENDENT_AMBULATORY_CARE_PROVIDER_SITE_OTHER): Payer: 59 | Admitting: Physician Assistant

## 2014-01-05 VITALS — BP 130/80 | HR 117 | Temp 98.4°F | Resp 16 | Ht 65.5 in | Wt 200.0 lb

## 2014-01-05 DIAGNOSIS — M79609 Pain in unspecified limb: Secondary | ICD-10-CM

## 2014-01-05 DIAGNOSIS — F411 Generalized anxiety disorder: Secondary | ICD-10-CM

## 2014-01-05 DIAGNOSIS — F988 Other specified behavioral and emotional disorders with onset usually occurring in childhood and adolescence: Secondary | ICD-10-CM

## 2014-01-05 DIAGNOSIS — M79674 Pain in right toe(s): Secondary | ICD-10-CM

## 2014-01-05 DIAGNOSIS — F419 Anxiety disorder, unspecified: Secondary | ICD-10-CM

## 2014-01-05 DIAGNOSIS — G4700 Insomnia, unspecified: Secondary | ICD-10-CM

## 2014-01-05 DIAGNOSIS — G894 Chronic pain syndrome: Secondary | ICD-10-CM

## 2014-01-05 MED ORDER — OXYCODONE HCL 30 MG PO TABS: 30.0000 mg | ORAL_TABLET | ORAL | Status: DC

## 2014-01-05 MED ORDER — OXYCODONE HCL 15 MG PO TABS: 15.0000 mg | ORAL_TABLET | ORAL | Status: DC

## 2014-01-05 MED ORDER — SERTRALINE HCL 100 MG PO TABS: 150.0000 mg | ORAL_TABLET | Freq: Every day | ORAL | Status: DC

## 2014-01-05 MED ORDER — AMPHETAMINE-DEXTROAMPHETAMINE 30 MG PO TABS: 15.0000 mg | ORAL_TABLET | Freq: Three times a day (TID) | ORAL | Status: DC

## 2014-01-05 NOTE — Patient Instructions (Addendum)
Check the dose of neurontin that you have at home and let me know.  Increasing the dose may help with your sleep, at least initially, and also help reduce your pain.  Please also call to schedule with Dr. Corliss Skainseveshwar.

## 2014-01-05 NOTE — Progress Notes (Signed)
Subjective:    Patient ID: [name], female    DOB: 13-Oct-1971, 43 y.o.   MRN: [id number]    PCP: Chrishonda Hesch, PA-C  Chief Complaint  Patient presents with  . Medication Refill    Medications, allergies, past medical history, surgical history, family history, social history and problem list reviewed and updated.  HPI  3 days of pain in the RIGHT 5th toe after accidentally kicked the dining room table with the RIGHT foot.  Thinks she may have broken the 5th toe.  The bruising has resolved, but it's still quite painful. She's been buddy-taping it since the injury.  Pain is stable on current doses. Not ready to reduce doses yet. Still has not scheduled her appointment with Dr. Corliss Skains. She indicates the recent winter weather and car problems has prevented her from doing so.  Anxiety is getting much worse.  Doesn't want to leave the house.  Crowds are especially anxiety provoking. Increased difficulty sleeping, and then exhausted all day.  She's resorted to using "everything" she has-leftover trazodone ("and I'm just putting up with the nightmares"), Vistaril and Neurontin (prescribed elsewhere for neck pain; she doesn't know the dose, but she's taking 2 of them at bedtime).   She notes that her anxiety was well controlled when she was taking the alprazolam TID, but that another provider was uncomfortable with that regimen.  She feels like the Valium isn't strong enough at bedtime, or that she'd like to take it more often than at HS.  She's been on sertraline 150 mg previously, and thinks that it was reduced back to 100 mg because the higher dose wasn't effective, but it was a long time ago and she's not sure.  She doesn't think that the use of Adderall for ADD is contributing to her insomnia, since she's not been taking it much recently.  She does, however, need a refill.   Review of Systems As above.    Objective:   Physical Exam  BP 130/80  Pulse 117  Temp(Src) 98.4 F  (36.9 C) (Oral)  Resp 16  Ht 5' 5.5" (1.664 m)  Wt 200 lb (90.719 kg)  BMI 32.76 kg/m2  SpO2 95% WDWNWF, A&O x 3. Looks tired. [place]/AT, PERRL. Sclera and conjunctiva are clear. Neck is supple, NT, no thyromegaly or adenopathy. Heart with RRR. Lungs are CTA. Skin is warm and dry. No LE edema.  RIGHT 5th toe is tender.  No eccycmosis.  No pain with palpation of the metatarsals.      Assessment & Plan:  1. Toe pain, right Discussed xray, which she declined.  Continue buddy-taping.  RTC if symptoms worsen/persist.  2. Insomnia Due to anxiety.  See below.  3. Anxiety Increase sertraline dose.  Contact me with the dose of Neurontin she has at home.  Increasing it may help her sleep temporarily, and help reduce her pain, which is no-doubt contributing.  Consider the addition of a TCA if that's not helpful.  Continue valium 10 mg at HS. - sertraline (ZOLOFT) 100 MG tablet; Take 1.5 tablets (150 mg total) by mouth at bedtime.  Dispense: 45 tablet; Refill: 5  4. Chronic pain disorder Stable.  Encouraged to contact Dr. Fatima Sanger office to schedule.  She will have a 1-time visit there to evaluate for underlying connective tissue disorder as cause for her symptoms. - oxyCODONE (ROXICODONE) 15 MG immediate release tablet; Take 1 tablet (15 mg total) by mouth every 4 (four) hours. Take with 30 mg to achieve 45  mg dose  Dispense: 90 tablet; Refill: 0 - oxycodone (ROXICODONE) 30 MG immediate release tablet; Take 1 tablet (30 mg total) by mouth every 4 (four) hours. Take with 15 mg to achieve 45 mg dose  Dispense: 90 tablet; Refill: 0  5. ADD (attention deficit disorder) Stable.  Uses only when she works. - amphetamine-dextroamphetamine (ADDERALL) 30 MG tablet; Take 0.5 tablets (15 mg total) by mouth 3 (three) times daily.  Dispense: 45 tablet; Refill: 0   [name], PA-C Physician Assistant-Certified [facility]

## 2014-01-10 ENCOUNTER — Ambulatory Visit (INDEPENDENT_AMBULATORY_CARE_PROVIDER_SITE_OTHER): Payer: 59 | Admitting: Family Medicine

## 2014-01-10 VITALS — BP 108/70 | HR 96 | Temp 98.0°F | Resp 16 | Ht 65.5 in | Wt 200.0 lb

## 2014-01-10 DIAGNOSIS — J029 Acute pharyngitis, unspecified: Secondary | ICD-10-CM

## 2014-01-10 DIAGNOSIS — B9789 Other viral agents as the cause of diseases classified elsewhere: Secondary | ICD-10-CM

## 2014-01-10 DIAGNOSIS — B349 Viral infection, unspecified: Secondary | ICD-10-CM

## 2014-01-10 LAB — POCT RAPID STREP A (OFFICE): Rapid Strep A Screen: NEGATIVE

## 2014-01-10 MED ORDER — MAGIC MOUTHWASH W/LIDOCAINE: 5.0000 mL | Freq: Four times a day (QID) | ORAL | Status: DC | PRN

## 2014-01-10 MED ORDER — METHYLPREDNISOLONE (PAK) 4 MG PO TABS: ORAL_TABLET | ORAL | Status: DC

## 2014-01-10 NOTE — Progress Notes (Signed)
Chief Complaint:  Chief Complaint  Patient presents with  . Sore Throat    x 3 day    HPI: [name] is a 43 y.o. female who is here for : Sore right tonsil  x 1 week, Noticed she had white spot on it today, difficulty swallowing, nausea, no vomiting, has not taken temperature, + headache, denies ear pain, denies abdominal pain No acute muscle aches. BUt she does have generalized chronic malaise and muscle aches  that are being worked up. She also has a rash on her right palm and fingers  which started today is different from her normal rashes, they are flat. No mouth ulcers. +/- subjective fevers, chills Deneis cough or URI sxs, no sick contacts with children. 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 Denies immno comprmise, neg for HIV/RPR per patient. See in EMR that she has had neg HIV but I do not see RPR ever being done  Please note her prior h/o painmed dep below: Referral to rheumatology made for generalized joint aches, waiting on call back.  Unsuccessful in getting in with her former substance abuse counselor. Has not been accepted at [facility]. Is interested in no longer taking chronic pain meds, but also understands that it may not be possible. Hopes that if underlying rheumatological problem is contributing to her pain that addressing it could significantly improve her symptoms and reduce her need for narcotics.   Past Medical History  Diagnosis Date  . Anxiety   . GERD (gastroesophageal reflux disease)   . History of alcohol abuse     no alcohol since 2004  . Migraine headache   . History of cocaine abuse     none since 2011, entered treatment  . History of kidney stones   . Arthritis     left knee  . Depression   . Complication of anesthesia 03/2011    history of aspiration after anesthesia  . History of esophageal dilatation     multiple  . History of pancreatitis 2012  . Hypertension     under control with med., has been on med. x 3  yr.  . Sleep apnea     only uses CPAP machine 3 x/week  . Medial meniscus tear 07/2013    right  . Cervical muscle strain 07/2013    current Prednisone taper  . Dental crowns present     x 2 upper front  . Genital herpes simplex type 2   . Herniated disc, cervical    Past Surgical History  Procedure Laterality Date  . Abdominal hysterectomy      partial  . Cesarean section      x 2  . Nasal septum surgery    . Carpal tunnel release  07/29/2012    Procedure: CARPAL TUNNEL RELEASE;  Surgeon: Eldred Manges, MD;  Location: [facility];  Service: Orthopedics;  Laterality: Right;  Right carpal tunnel release  . Laparoscopic appendectomy  02/21/2006  . Laparoscopic lysis of adhesions  02/21/2006  . Cholecystectomy  04/05/2010  . Ercp w/ metal stent placement  03/14/2011  . Cystoscopy w/ ureteral stent placement Right 06/11/2007  . Nasal septoplasty w/ turbinoplasty Bilateral 07/26/2009    bilat. turb. reduction  . Knee arthroscopy Left     x 3  . Knee arthroscopy with medial menisectomy Right 07/31/2013    Procedure: KNEE ARTHROSCOPY WITH PARTIAL LATERAL MENISECTOMY, CHONDROPLASTY;  Surgeon: Loreta Ave,  MD;  Location: [facility];  Service: Orthopedics;  Laterality: Right;   History   Social History  . Marital Status: Significant Other    Spouse Name: Dustin Flocknnette Andrews    Number of Children: 2  . Years of Education: N/A   Occupational History  . CNA    Social History Main Topics  . Smoking status: Former Smoker    Quit date: 06/05/2013  . Smokeless tobacco: Never Used  . Alcohol Use: No     Comment: none since 2004  . Drug Use: No     Comment: none since 2011  . Sexual Activity: Yes    Partners: Female   Other Topics Concern  . None   Social History Narrative   3 caffeine drinks daily       Was recruited to [employer], with a full scholarship, for undergraduate studies, but had to decline due to family problems.  She then planned to attend  [employer] ([employer]) but wasn't able to work and attend courses, so went to [employer] and studied Production designer, theatre/television/filmCommercial Arts.      Has dropped out of the substance abuse counseling program she was enrolled in, having figured out that it's not a career path she wants to continue.  [place] in [place]                  Family History  Problem Relation Age of Onset  . Heart disease Maternal Uncle   . Heart disease Maternal Grandmother   . Kidney cancer Mother   . Hypertension Mother   . Cancer Mother     renal  . Lung cancer Maternal Aunt   . Stroke Maternal Uncle 55  . Alcohol abuse Maternal Uncle    Allergies  Allergen Reactions  . Imitrex [Sumatriptan Base] Shortness Of Breath and Other (See Comments)    TACHYCARDIA  . Trazodone And Nefazodone Other (See Comments)    NIGHTMARES  . Effexor [Venlafaxine] Other (See Comments)    MAKES HER "JITTERY"  . Metoclopramide Hcl Itching  . Morphine And Related Itching  . Tegretol [Carbamazepine] Itching  . Topamax Itching   Prior to Admission medications   Medication Sig Start Date End Date Taking? Authorizing Provider  amLODipine (NORVASC) 10 MG tablet Take 1 tablet (10 mg total) by mouth every morning. 09/16/13  Yes [name] S Jeffery, PA-C  amphetamine-dextroamphetamine (ADDERALL) 30 MG tablet Take 0.5 tablets (15 mg total) by mouth 3 (three) times daily. 01/05/14  Yes [name] S Jeffery, PA-C  cloNIDine (CATAPRES) 0.1 MG tablet Take 0.1 mg by mouth 3 (three) times daily.   Yes Historical Provider, MD  diazepam (VALIUM) 10 MG tablet TAKE 1 TABLET BY MOUTH AT BEDTIME AS NEEDED FOR ANXIETY   Yes [name] S Jeffery, PA-C  EPINEPHrine (EPIPEN) 0.3 mg/0.3 mL SOAJ injection Inject 0.3 mg into the muscle once as needed (Anaphylaxis).    Yes Historical Provider, MD  Gabapentin (NEURONTIN PO) Take by mouth.   Yes Historical Provider, MD  hydrOXYzine (ATARAX/VISTARIL) 25 MG tablet TAKE 1-4 TABLETS BY MOUTH EVERY 6 HOURS AS NEEDED FOR ANXIETY.  11/25/13  Yes [name] S Jeffery, PA-C  ibuprofen (ADVIL,MOTRIN) 200 MG tablet Take 600 mg by mouth 2 (two) times daily as needed for pain.   Yes Historical Provider, MD  indomethacin (INDOCIN) 50 MG capsule Take 1 capsule (50 mg total) by mouth 3 (three) times daily with meals. 12/09/13  Yes [name] S Jeffery, PA-C  meloxicam (MOBIC) 15 MG  tablet Take 1 tablet (15 mg total) by mouth daily. 10/10/13  Yes [name] S Jeffery, PA-C  methocarbamol (ROBAXIN) 750 MG tablet Take 1-2 tablets (750-1,500 mg total) by mouth every 6 (six) hours as needed for muscle spasms. 12/09/13  Yes [name] S Jeffery, PA-C  ondansetron (ZOFRAN) 4 MG tablet Take 1 tablet (4 mg total) by mouth every 6 (six) hours. 08/31/13  Yes Trevor Mace, PA-C  oxyCODONE (ROXICODONE) 15 MG immediate release tablet Take 1 tablet (15 mg total) by mouth every 4 (four) hours. Take with 30 mg to achieve 45 mg dose 01/05/14  Yes [name] S Jeffery, PA-C  oxycodone (ROXICODONE) 30 MG immediate release tablet Take 1 tablet (30 mg total) by mouth every 4 (four) hours. Take with 15 mg to achieve 45 mg dose 01/05/14  Yes [name] S Jeffery, PA-C  pantoprazole (PROTONIX) 40 MG tablet Take 40 mg by mouth at bedtime.    Yes Historical Provider, MD  promethazine (PHENERGAN) 25 MG tablet Take 25 mg by mouth every 6 (six) hours as needed for nausea.   Yes Historical Provider, MD  sertraline (ZOLOFT) 100 MG tablet Take 1.5 tablets (150 mg total) by mouth at bedtime. 01/05/14  Yes [name] S Jeffery, PA-C     ROS: The patient denies night sweats, unintentional weight loss, chest pain, palpitations, wheezing, dyspnea on exertion, nausea, vomiting, abdominal pain, dysuria, hematuria, melena, numbness, , or tingling.   All other systems have been reviewed and were otherwise negative with the exception of those mentioned in the HPI and as above.    PHYSICAL EXAM: Filed Vitals:   01/10/14 0906  BP: 108/70  Pulse: 96  Temp: 98 F (36.7 C)  Resp: 16   Filed Vitals:   01/10/14  0906  Height: 5' 5.5" (1.664 m)  Weight: 200 lb (90.719 kg)   Body mass index is 32.76 kg/(m^2).  General: Alert, no acute distress HEENT:  Normocephalic, atraumatic, oropharynx patent. EOMI, PERRLA. + Vesicles  In posterior throat, no exudates/no ulcers Cardiovascular:  Regular rate and rhythm, no rubs murmurs or gallops.  No Carotid bruits, radial pulse intact. No pedal edema.  Respiratory: Clear to auscultation bilaterally.  No wheezes, rales, or rhonchi.  No cyanosis, no use of accessory musculature GI: No organomegaly, abdomen is soft and non-tender, positive bowel sounds.  No masses. Skin: + flat slightly eczematous nodules on hand, not necessarily on knuckles/joints, there is anew erythematous papular lesion on palm  . None on feet.  Or soles of feet Neurologic: Facial musculature symmetric. Psychiatric: Patient is appropriate throughout our interaction. Lymphatic: No cervical lymphadenopathy Musculoskeletal: Gait intact.   LABS: Results for orders placed in visit on 01/10/14  POCT RAPID STREP A (OFFICE)      Result Value Ref Range   Rapid Strep A Screen Negative  Negative     EKG/XRAY:   Primary read interpreted by Dr. Conley Rolls at [facility].   ASSESSMENT/PLAN: Encounter Diagnoses  Name Primary?  . Acute pharyngitis Yes  . Viral syndrome    Ms Huot is a very interesting 43 y/o female who presents with a 1 week history of sorethroat with a new "white spot" in the back of her throat and a new rash on her hand which started today and so that is why she is here for evaluation. She is also coincidently being worked up for generalized muscle aches and joint aches.and has been referred to rheumatology.She has vesicles in the back of her throat c/w viral etiology--possible Entero/Coxsackie  Even  though she is an adult. -she has a new rash on her hands and has sore throat and vesicles on the back of her throat that is reminescent of hand-foot-mouth  Rx Medrol dose pack for severe sore throat   I don;t think her rash is c/w gottrons nodules but if she does have any type of dermatomyosits/polymysositis then the prednisone will help, she has been on prednisone before History of substance dependence so will not give any pain meds aside from what she already is prescribed by her PCP--[name] Tinnie Gens PA-C Throat culture pending F/u prn  Gross sideeffects, risk and benefits, and alternatives of medications d/w patient. Patient is aware that all medications have potential sideeffects and we are unable to predict every sideeffect or drug-drug interaction that may occur.  Yamileth Hayse PHUONG, DO 01/10/2014 11:59 AM

## 2014-01-10 NOTE — Progress Notes (Deleted)
   Subjective:    Patient ID: [name], female    DOB: Mar 26, 1971, 43 y.o.   MRN: [id number]  HPI Sore tonsil right, x 1 week,  Had white spot on it today, difficulty swallowing, nausea, no vomiting, has not taken temperature, headache, denies ear pain, denies abdominal pain   Review of Systems     Objective:   Physical Exam        Assessment & Plan:

## 2014-01-10 NOTE — Patient Instructions (Signed)
Hand, Foot, and Mouth Disease  Hand, foot, and mouth disease is a common viral illness. It occurs mainly in children younger than 45 years of age, but adolescents and adults may also get it. This disease is different than foot and mouth disease that cattle, sheep, and pigs get. Most people are better in 1 week.  CAUSES   Hand, foot, and mouth disease is usually caused by a group of viruses called enteroviruses. Hand, foot, and mouth disease can spread from person to person (contagious). A person is most contagious during the first week of the illness. It is not transmitted to or from pets or other animals. It is most common in the summer and early fall. Infection is spread from person to person by direct contact with an infected person's:  · Nose discharge.  · Throat discharge.  · Stool.  SYMPTOMS   Open sores (ulcers) occur in the mouth. Symptoms may also include:  · A rash on the hands and feet, and occasionally the buttocks.  · Fever.  · Aches.  · Pain from the mouth ulcers.  · Fussiness.  DIAGNOSIS   Hand, foot, and mouth disease is one of many infections that cause mouth sores. To be certain your child has hand, foot, and mouth disease your caregiver will diagnose your child by physical exam. Additional tests are not usually needed.  TREATMENT   Nearly all patients recover without medical treatment in 7 to 10 days. There are no common complications. Your child should only take over-the-counter or prescription medicines for pain, discomfort, or fever as directed by your caregiver. Your caregiver may recommend the use of an over-the-counter antacid or a combination of an antacid and diphenhydramine to help coat the lesions in the mouth and improve symptoms.   HOME CARE INSTRUCTIONS  · Try combinations of foods to see what your child will tolerate and aim for a balanced diet. Soft foods may be easier to swallow. The mouth sores from hand, foot, and mouth disease typically hurt and are painful when exposed to  salty, spicy, or acidic food or drinks.  · Milk and cold drinks are soothing for some patients. Milk shakes, frozen ice pops, slushies, and sherberts are usually well tolerated.  · Sport drinks are good choices for hydration, and they also provide a few calories. Often, a child with hand, foot, and mouth disease will be able to drink without discomfort.    · For younger children and infants, feeding with a cup, spoon, or syringe may be less painful than drinking through the nipple of a bottle.  · Keep children out of childcare programs, schools, or other group settings during the first few days of the illness or until they are without fever. The sores on the body are not contagious.  SEEK IMMEDIATE MEDICAL CARE IF:  · Your child develops signs of dehydration such as:  · Decreased urination.  · Dry mouth, tongue, or lips.  · Decreased tears or sunken eyes.  · Dry skin.  · Rapid breathing.  · Fussy behavior.  · Poor color or pale skin.  · Fingertips taking longer than 2 seconds to turn pink after a gentle squeeze.  · Rapid weight loss.  · Your child does not have adequate pain relief.  · Your child develops a severe headache, stiff neck, or change in behavior.  · Your child develops ulcers or blisters that occur on the lips or outside of the mouth.  Document Released: 07/22/2003 Document Revised: 01/15/2012 Document Reviewed: 04/06/2011    ExitCare® Patient Information ©2014 [employer].

## 2014-01-12 LAB — CULTURE, GROUP A STREP: Organism ID, Bacteria: NORMAL

## 2014-01-13 ENCOUNTER — Encounter: Payer: Self-pay | Admitting: Physician Assistant

## 2014-01-13 MED ORDER — MAGIC MOUTHWASH W/LIDOCAINE: 5.0000 mL | Freq: Four times a day (QID) | ORAL | Status: DC | PRN

## 2014-01-13 NOTE — Telephone Encounter (Signed)
Patient notified via My Chart.  [name],  I've just reviewed your recent visit with Dr. Conley RollsLe, and see that the throat culture that was sent out was negative.  So, you don't have strep.  I'm surprised that the prednisone didn't help.  I'll send in a mouthwash wiith lidocaine in it, to give you some relief.  If your symptoms persist, though, please come back in, rather than increasing the pain medication.  Warmly, Eeshan Verbrugge  Meds ordered this encounter  Medications  . Alum & Mag Hydroxide-Simeth (MAGIC MOUTHWASH W/LIDOCAINE) SOLN    Sig: Take 5 mLs by mouth 4 (four) times daily as needed for mouth pain.    Dispense:  120 mL    Refill:  0    OK to substitute your facility Magic Mouthwash recipe, mix 1:1 with 2% lidocaine.    Order Specific Question:  Supervising Provider    Answer:  Ellamae SiaOLITTLE, ROBERT P [[id number]]

## 2014-01-15 ENCOUNTER — Encounter: Payer: Self-pay | Admitting: Physician Assistant

## 2014-01-16 ENCOUNTER — Ambulatory Visit (INDEPENDENT_AMBULATORY_CARE_PROVIDER_SITE_OTHER): Payer: 59 | Admitting: Physician Assistant

## 2014-01-16 VITALS — BP 130/90 | HR 109 | Temp 98.0°F | Resp 16 | Ht 65.5 in | Wt 194.0 lb

## 2014-01-16 DIAGNOSIS — G4700 Insomnia, unspecified: Secondary | ICD-10-CM

## 2014-01-16 DIAGNOSIS — J029 Acute pharyngitis, unspecified: Secondary | ICD-10-CM

## 2014-01-16 DIAGNOSIS — G894 Chronic pain syndrome: Secondary | ICD-10-CM

## 2014-01-16 MED ORDER — ALPRAZOLAM 1 MG PO TABS
1.0000 mg | ORAL_TABLET | Freq: Every day | ORAL | Status: DC
Start: 2014-01-16 — End: 2014-02-16

## 2014-01-16 MED ORDER — OXYCODONE HCL 20 MG PO TABS: 40.0000 mg | ORAL_TABLET | ORAL | Status: DC

## 2014-01-16 NOTE — Progress Notes (Signed)
Subjective:    Patient ID: [name], female    DOB: 08-08-1971, 43 y.o.   MRN: [id number]   PCP: Tyrann Donaho, PA-C  Chief Complaint  Patient presents with  . Medication Refill    oxycodone    Medications, allergies, past medical history, surgical history, family history, social history and problem list reviewed and updated.  HPI Presents to reduce her dose of oxycodone from 45 mg Q4 hours to 40 mg. She has #21 30 mg and #21 15 mg tabs remaining.  Valium is no longer helping her sleep. Wants to return to Xanax, 3 mg at bedtime. Currently using benadryl, muscle relaxant and "other concoctions," and would rather use one product that she knows will work.  Her sore throat is about 75% better.  No new symptoms.  She lesions on her hands are resolved.  No fever, chills, nausea, vomiting, diarrhea.  No new muscle or joint pain.  She has put off the evaluation with Dr. Corliss Skains until she gets a couple of teeth repaired, which has been postponed due to the sore throat.  She is accompanied today by her partner, [name].  Review of Systems As above.    Objective:   Physical Exam  Blood pressure 130/90, pulse 109, temperature 98 F (36.7 C), temperature source Oral, resp. rate 16, height 5' 5.5" (1.664 m), weight 194 lb (87.998 kg), SpO2 98.00%. Body mass index is 31.78 kg/(m^2). Well-developed, well nourished WF who is awake, alert and oriented, in NAD. HEENT: [place]/AT, PERRL, EOMI.  Sclera and conjunctiva are clear.  EAC are patent, TMs are normal in appearance. Nasal mucosa is pink and moist. OP is clear. Neck: supple, non-tender, no lymphadenopathy, thyromegaly. Heart: RRR, no murmur Lungs: normal effort, CTA Back: tenderness of the lumbar spine>paraspinous muscles. Extremities: no cyanosis, clubbing or edema. Knees are stable, mildly tender on exam. Skin: warm and dry without rash. Psychologic: good mood and appropriate affect, normal speech and behavior.       Assessment  & Plan:  1. Chronic pain disorder Reduce the dose to 40 mg Q4 hours.  She'll hold on to the leftover 15 and 30 mg tabs to use when we reduce the dose to 35 mg. - Oxycodone HCl 20 MG TABS; Take 2 tablets (40 mg total) by mouth every 4 (four) hours.  Dispense: 168 tablet; Refill: 0  2. Insomnia Stop the Valium, Benadryl, etc. - ALPRAZolam (XANAX) 1 MG tablet; Take 1-3 tablets (1-3 mg total) by mouth at bedtime.  Dispense: 90 tablet; Refill: 0  3. Acute pharyngitis Continue with Magic Mouthwash and fluids, rest.   [name], PA-C Physician Assistant-Certified [facility]

## 2014-01-28 ENCOUNTER — Encounter: Payer: Self-pay | Admitting: Physician Assistant

## 2014-01-28 DIAGNOSIS — G894 Chronic pain syndrome: Secondary | ICD-10-CM

## 2014-01-28 MED ORDER — OXYCODONE HCL 20 MG PO TABS: 40.0000 mg | ORAL_TABLET | ORAL | Status: DC

## 2014-01-28 NOTE — Telephone Encounter (Signed)
Meds ordered this encounter  Medications  . Oxycodone HCl 20 MG TABS    Sig: Take 2 tablets (40 mg total) by mouth every 4 (four) hours.    Dispense:  168 tablet    Refill:  0    The patient has #21 30 mg tabs and #21 15 mg tabs remaining, that she will reserve and use when she drops the dose down to 35 mg.    Order Specific Question:  Supervising Provider    Answer:  Tonye PearsonOLITTLE, ROBERT P [[id number]]   Patient notified via My Chart.

## 2014-02-11 ENCOUNTER — Ambulatory Visit (INDEPENDENT_AMBULATORY_CARE_PROVIDER_SITE_OTHER): Payer: 59 | Admitting: Physician Assistant

## 2014-02-11 VITALS — BP 110/78 | HR 84 | Temp 98.4°F | Resp 16 | Ht 66.5 in | Wt 198.2 lb

## 2014-02-11 DIAGNOSIS — I10 Essential (primary) hypertension: Secondary | ICD-10-CM

## 2014-02-11 DIAGNOSIS — G894 Chronic pain syndrome: Secondary | ICD-10-CM

## 2014-02-11 DIAGNOSIS — F988 Other specified behavioral and emotional disorders with onset usually occurring in childhood and adolescence: Secondary | ICD-10-CM

## 2014-02-11 MED ORDER — OXYCODONE HCL 20 MG PO TABS: 40.0000 mg | ORAL_TABLET | ORAL | Status: DC

## 2014-02-11 MED ORDER — AMPHETAMINE-DEXTROAMPHETAMINE 30 MG PO TABS: 15.0000 mg | ORAL_TABLET | Freq: Three times a day (TID) | ORAL | Status: DC

## 2014-02-11 NOTE — Progress Notes (Signed)
Subjective:    Patient ID: [name], female    DOB: 05/22/1971, 43 y.o.   MRN: [id number]   PCP: Jemya Depierro, PA-C  Chief Complaint  Patient presents with  . med refills    Medications, allergies, past medical history, surgical history, family history, social history and problem list reviewed and updated.  HPI Presents for refills of Adderall and Oxycodone. Her Adderall dose has been stable for some time.  She only takes it when she works, rather than every day, but it makes a big difference in her ability to focus, complete tasks and stay on track. Her pain is adequately controlled, and she feels good about where she is right now.  Still motivated to reduce the dose, but previous attempts to reduce too quickly have resulted in need to increase her use of controlled substances for pain, anxiety and insomnia. Her daughter, [name], keeps the medications in a locked box and dispenses each day's dose. This is working much better than when her partner managed them. "It's really good right now.  I'd like to just hang here."   Review of Systems No chest pain, SOB, HA, dizziness, vision change, N/V, diarrhea, constipation, dysuria, urinary urgency or frequency, new myalgias, new arthralgias or rash.     Objective:   Physical Exam  Vitals reviewed. Constitutional: She is oriented to person, place, and time. Vital signs are normal. She appears well-developed and well-nourished. She is active and cooperative. No distress.  BP 110/78  Pulse 84  Temp(Src) 98.4 F (36.9 C) (Oral)  Resp 16  Ht 5' 6.5" (1.689 m)  Wt 198 lb 3.2 oz (89.903 kg)  BMI 31.51 kg/m2  SpO2 98%  HENT:  Head: Normocephalic and atraumatic.  Right Ear: Hearing normal.  Left Ear: Hearing normal.  Eyes: Conjunctivae are normal. No scleral icterus.  Neck: Normal range of motion. Neck supple. No thyromegaly present.  Cardiovascular: Normal rate, regular rhythm and normal heart sounds.   Pulses:      Radial  pulses are 2+ on the right side, and 2+ on the left side.  Pulmonary/Chest: Effort normal and breath sounds normal.  Musculoskeletal:       Thoracic back: She exhibits bony tenderness.       Lumbar back: She exhibits bony tenderness.       Back:  Back exam is her baseline.  Lymphadenopathy:       Head (right side): No tonsillar, no preauricular, no posterior auricular and no occipital adenopathy present.       Head (left side): No tonsillar, no preauricular, no posterior auricular and no occipital adenopathy present.    She has no cervical adenopathy.       Right: No supraclavicular adenopathy present.       Left: No supraclavicular adenopathy present.  Neurological: She is alert and oriented to person, place, and time. No sensory deficit.  Skin: Skin is warm, dry and intact. No rash noted. No cyanosis or erythema. Nails show no clubbing.  Psychiatric: She has a normal mood and affect.          Assessment & Plan:  1. Chronic pain disorder Stable.  Continue this dose for 4 weeks, then reasssess readiness to reduce to 35 mg. - Oxycodone HCl 20 MG TABS; Take 2 tablets (40 mg total) by mouth every 4 (four) hours.  Dispense: 168 tablet; Refill: 0 - Oxycodone HCl 20 MG TABS; Take 2 tablets (40 mg total) by mouth every 4 (four) hours. May fill 14  days after date on prescription.  Dispense: 168 tablet; Refill: 0  2. Hypertension Well controlled.  3. ADD (attention deficit disorder) Stable.  Continue current treatment. - amphetamine-dextroamphetamine (ADDERALL) 30 MG tablet; Take 0.5 tablets (15 mg total) by mouth 3 (three) times daily.  Dispense: 45 tablet; Refill: 0   [name], PA-C Physician Assistant-Certified [facility]

## 2014-02-16 ENCOUNTER — Encounter: Payer: Self-pay | Admitting: Physician Assistant

## 2014-02-16 DIAGNOSIS — G4700 Insomnia, unspecified: Secondary | ICD-10-CM

## 2014-02-16 MED ORDER — ALPRAZOLAM 1 MG PO TABS: 1.0000 mg | ORAL_TABLET | Freq: Every day | ORAL | Status: DC

## 2014-02-16 NOTE — Telephone Encounter (Signed)
Rx printed. Please fax/phone in.  Patient notified via My Chart.

## 2014-02-16 NOTE — Telephone Encounter (Signed)
Faxed Rx

## 2014-02-22 ENCOUNTER — Ambulatory Visit (INDEPENDENT_AMBULATORY_CARE_PROVIDER_SITE_OTHER): Payer: 59 | Admitting: Family Medicine

## 2014-02-22 VITALS — BP 124/90 | HR 93 | Temp 98.2°F | Resp 18 | Ht 65.0 in | Wt 196.0 lb

## 2014-02-22 DIAGNOSIS — H60399 Other infective otitis externa, unspecified ear: Secondary | ICD-10-CM

## 2014-02-22 MED ORDER — CIPROFLOXACIN-DEXAMETHASONE 0.3-0.1 % OT SUSP: 4.0000 [drp] | Freq: Two times a day (BID) | OTIC | Status: DC

## 2014-02-22 NOTE — Progress Notes (Signed)
°  This chart was scribed for Johnn HaiKurt Lauestein, MD by Joaquin MusicKristina Sanchez-Matthews, ED Scribe. This patient was seen in room Room/bed 2 and the patient's care was started at 10:11 AM. Subjective:    Patient ID: [name], female    DOB: 06-02-1971, 43 y.o.   MRN: [id number] Chief Complaint  Patient presents with   Ear Drainage    Right ear, off and on   HPI [name] is a 43 y.o. female who [facility] complaining of ongoing R ear drainage that began several months ago. She states she is having constant pink-yellow and clear ear drainage and develops a crust in her R auricle. She states she sleeps on her R side and reports not being able to sleep any longer due to the amount of drainage. She states the drainage generally saturates 2-3 Q-tips. Pt states she was seen by a PA whom work with Dr. Annalee GentaShoemaker, ENT a few years ago but was simply turned away. Pt denies being prescribed ear drops.  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  Review of Systems  HENT: Positive for ear discharge and ear pain. Negative for facial swelling and hearing loss.   Cardiovascular: Negative for chest pain.  Skin: Negative for color change.  Psychiatric/Behavioral: Positive for sleep disturbance.   Objective:   Physical Exam  Nursing note and vitals reviewed. Constitutional: She is oriented to person, place, and time. She appears well-developed and well-nourished. No distress.  HENT:  Head: Normocephalic and atraumatic.  Right Ear: External ear normal.  Left Ear: External ear normal.  Mildly swollen ear canal with small amount of cerasin fluid on the flood.  Eyes: Conjunctivae are normal. Pupils are equal, round, and reactive to light.  Neck: Normal range of motion. Neck supple. No thyromegaly present.  Cardiovascular: Normal rate and regular rhythm.   No murmur heard. Pulmonary/Chest: Effort normal and breath sounds normal. She has no wheezes.  Abdominal: Bowel sounds  are normal. She exhibits no mass. There is no rebound and no guarding.  Musculoskeletal: Normal range of motion. She exhibits no tenderness.  Neurological: She is alert and oriented to person, place, and time. She has normal reflexes.  Skin: Skin is warm and dry. She is not diaphoretic.  Psychiatric: She has a normal mood and affect. Her behavior is normal.    Triage Vitals:BP 124/90   Pulse 93   Temp(Src) 98.2 F (36.8 C) (Oral)   Resp 18   Ht 5\' 5"  (1.651 m)   Wt 196 lb (88.905 kg)   BMI 32.62 kg/m2   SpO2 95% Assessment & Plan:   Meds ordered this encounter  Medications   ciprofloxacin-dexamethasone (CIPRODEX) otic suspension    Sig: Place 4 drops into the right ear 2 (two) times daily.    Dispense:  7.5 mL    Refill:  0    Otitis, externa, infective - Plan: ciprofloxacin-dexamethasone (CIPRODEX) otic suspension, DISCONTINUED: ciprofloxacin-dexamethasone (CIPRODEX) otic suspension  Signed, Elvina SidleKurt Lauenstein, MD

## 2014-02-22 NOTE — Patient Instructions (Signed)
Otitis Externa Otitis externa is a bacterial or fungal infection of the outer ear canal. This is the area from the eardrum to the outside of the ear. Otitis externa is sometimes called "swimmer's ear." CAUSES  Possible causes of infection include:  Swimming in dirty water.  Moisture remaining in the ear after swimming or bathing.  Mild injury (trauma) to the ear.  Objects stuck in the ear (foreign body).  Cuts or scrapes (abrasions) on the outside of the ear. SYMPTOMS  The first symptom of infection is often itching in the ear canal. Later signs and symptoms may include swelling and redness of the ear canal, ear pain, and yellowish-white fluid (pus) coming from the ear. The ear pain may be worse when pulling on the earlobe. DIAGNOSIS  Your caregiver will perform a physical exam. A sample of fluid may be taken from the ear and examined for bacteria or fungi. TREATMENT  Antibiotic ear drops are often given for 10 to 14 days. Treatment may also include pain medicine or corticosteroids to reduce itching and swelling. PREVENTION   Keep your ear dry. Use the corner of a towel to absorb water out of the ear canal after swimming or bathing.  Avoid scratching or putting objects inside your ear. This can damage the ear canal or remove the protective wax that lines the canal. This makes it easier for bacteria and fungi to grow.  Avoid swimming in lakes, polluted water, or poorly chlorinated pools.  You may use ear drops made of rubbing alcohol and vinegar after swimming. Combine equal parts of white vinegar and alcohol in a bottle. Put 3 or 4 drops into each ear after swimming. HOME CARE INSTRUCTIONS   Apply antibiotic ear drops to the ear canal as prescribed by your caregiver.  Only take over-the-counter or prescription medicines for pain, discomfort, or fever as directed by your caregiver.  If you have diabetes, follow any additional treatment instructions from your caregiver.  Keep all  follow-up appointments as directed by your caregiver. SEEK MEDICAL CARE IF:   You have a fever.  Your ear is still red, swollen, painful, or draining pus after 3 days.  Your redness, swelling, or pain gets worse.  You have a severe headache.  You have redness, swelling, pain, or tenderness in the area behind your ear. MAKE SURE YOU:   Understand these instructions.  Will watch your condition.  Will get help right away if you are not doing well or get worse. Document Released: 10/23/2005 Document Revised: 01/15/2012 Document Reviewed: 11/09/2011 Exit[employer] Patient Information 2014 [employer].  

## 2014-03-03 ENCOUNTER — Encounter: Payer: Self-pay | Admitting: Physician Assistant

## 2014-03-09 ENCOUNTER — Ambulatory Visit (INDEPENDENT_AMBULATORY_CARE_PROVIDER_SITE_OTHER): Payer: 59 | Admitting: Physician Assistant

## 2014-03-09 VITALS — BP 104/84 | HR 92 | Temp 98.3°F | Resp 16 | Ht 65.25 in | Wt 189.0 lb

## 2014-03-09 DIAGNOSIS — M545 Low back pain, unspecified: Secondary | ICD-10-CM

## 2014-03-09 DIAGNOSIS — F419 Anxiety disorder, unspecified: Secondary | ICD-10-CM

## 2014-03-09 DIAGNOSIS — G894 Chronic pain syndrome: Secondary | ICD-10-CM

## 2014-03-09 DIAGNOSIS — F411 Generalized anxiety disorder: Secondary | ICD-10-CM

## 2014-03-09 DIAGNOSIS — G43909 Migraine, unspecified, not intractable, without status migrainosus: Secondary | ICD-10-CM

## 2014-03-09 DIAGNOSIS — G4700 Insomnia, unspecified: Secondary | ICD-10-CM

## 2014-03-09 MED ORDER — OXYCODONE HCL 20 MG PO TABS: 40.0000 mg | ORAL_TABLET | ORAL | Status: DC

## 2014-03-09 MED ORDER — PROMETHAZINE HCL 25 MG PO TABS: 25.0000 mg | ORAL_TABLET | Freq: Four times a day (QID) | ORAL | Status: DC | PRN

## 2014-03-09 MED ORDER — KETOROLAC TROMETHAMINE 60 MG/2ML IM SOLN
60.0000 mg | Freq: Once | INTRAMUSCULAR | Status: AC
  Administered 2014-03-09: 60 mg via INTRAMUSCULAR

## 2014-03-09 MED ORDER — PROMETHAZINE HCL 25 MG/ML IJ SOLN
25.0000 mg | Freq: Once | INTRAMUSCULAR | Status: AC
  Administered 2014-03-09: 25 mg via INTRAMUSCULAR

## 2014-03-09 MED ORDER — ALPRAZOLAM 1 MG PO TABS: 1.0000 mg | ORAL_TABLET | Freq: Every day | ORAL | Status: DC

## 2014-03-09 NOTE — Progress Notes (Signed)
Subjective:    Patient ID: [name], female    DOB: Mar 27, 1971, 43 y.o.   MRN: [id number]   PCP: Aneira Cavitt, PA-C  Chief Complaint  Patient presents with  . Medication Refill    Oxycodone   Medications, allergies, past medical history, surgical history, family history, social history and problem list reviewed and updated.  HPI  Presents for a refill of oxycodone, for chronic pain in the low back.  Doing well on the current dose.  Thinks that the 2 week supply is best for now, having a 1 month supply is so many pills, it feels uncomfortable to her.  Has a migraine right now.  Had one last night, too.  Her daughter, [name], is graduating this weekend, and recently broke up with her boyfriend. There's been more drama in the house than usual and some extended family discord surrounding the patient's sexual orientation. She requests a refill of phenergan to help her sleep through.  Back pain is unchanged.  Hurts down the middle, without loss of bowel or bladder control, saddle anesthesia, LE weakness or paresthesias.  Review of Systems As above.  No CP, SOB, HA, dizziness.    Objective:   Physical Exam  Vitals reviewed. Constitutional: She is oriented to person, place, and time. Vital signs are normal. She appears well-developed and well-nourished. She is active and cooperative. No distress.  BP 104/84  Pulse 92  Temp(Src) 98.3 F (36.8 C) (Oral)  Resp 16  Ht 5' 5.25" (1.657 m)  Wt 189 lb (85.73 kg)  BMI 31.22 kg/m2  SpO2 100%  HENT:  Head: Normocephalic and atraumatic.  Right Ear: Hearing normal.  Left Ear: Hearing normal.  Eyes: Conjunctivae are normal. No scleral icterus.  Neck: Normal range of motion. Neck supple. No thyromegaly present.  Cardiovascular: Normal rate, regular rhythm and normal heart sounds.   Pulses:      Radial pulses are 2+ on the right side, and 2+ on the left side.  Pulmonary/Chest: Effort normal and breath sounds normal.  Musculoskeletal:         Cervical back: Normal.       Thoracic back: Normal.       Lumbar back: She exhibits tenderness, bony tenderness and pain. She exhibits normal range of motion, no swelling, no edema, no deformity, no laceration and no spasm.  Lymphadenopathy:       Head (right side): No tonsillar, no preauricular, no posterior auricular and no occipital adenopathy present.       Head (left side): No tonsillar, no preauricular, no posterior auricular and no occipital adenopathy present.    She has no cervical adenopathy.       Right: No supraclavicular adenopathy present.       Left: No supraclavicular adenopathy present.  Neurological: She is alert and oriented to person, place, and time. She has normal strength. No sensory deficit.  Skin: Skin is warm, dry and intact. No rash noted. No cyanosis or erythema. Nails show no clubbing.  Psychiatric: She has a normal mood and affect.          Assessment & Plan:  1. Chronic pain disorder 2. Lumbar back pain Stable.  Continue current dose for now.  Consider reducing to 35 mg at next visit. - Oxycodone HCl 20 MG TABS; Take 2 tablets (40 mg total) by mouth every 4 (four) hours.  Dispense: 168 tablet; Refill: 0 - Oxycodone HCl 20 MG TABS; Take 2 tablets (40 mg total) by mouth every 4 (  four) hours. May fill 14 days after date on prescription.  Dispense: 168 tablet; Refill: 0  3. Migraine headache trigered by recent increased stressors - promethazine (PHENERGAN) 25 MG tablet; Take 1 tablet (25 mg total) by mouth every 6 (six) hours as needed for nausea.  Dispense: 30 tablet; Refill: 1 - ketorolac (TORADOL) injection 60 mg; Inject 2 mLs (60 mg total) into the muscle once. - promethazine (PHENERGAN) injection 25 mg; Inject 1 mL (25 mg total) into the muscle once.  4. Anxiety 5. Insomnia Recent exacerbation with increased stressors. - ALPRAZolam (XANAX) 1 MG tablet; Take 1-3 tablets (1-3 mg total) by mouth at bedtime.  Dispense: 90 tablet; Refill:  0  Return in about 1 month (around 04/09/2014) for re-evaluation.   [name], PA-C Physician Assistant-Certified [facility]

## 2014-03-24 ENCOUNTER — Encounter: Payer: Self-pay | Admitting: Physician Assistant

## 2014-03-24 ENCOUNTER — Other Ambulatory Visit: Payer: Self-pay | Admitting: Physician Assistant

## 2014-03-26 ENCOUNTER — Ambulatory Visit: Payer: 59

## 2014-03-26 ENCOUNTER — Ambulatory Visit (INDEPENDENT_AMBULATORY_CARE_PROVIDER_SITE_OTHER): Payer: 59 | Admitting: Physician Assistant

## 2014-03-26 ENCOUNTER — Telehealth: Payer: Self-pay | Admitting: Radiology

## 2014-03-26 VITALS — BP 124/72 | HR 91 | Temp 99.0°F | Resp 16 | Ht 66.5 in | Wt 181.8 lb

## 2014-03-26 DIAGNOSIS — R634 Abnormal weight loss: Secondary | ICD-10-CM

## 2014-03-26 DIAGNOSIS — F419 Anxiety disorder, unspecified: Secondary | ICD-10-CM

## 2014-03-26 DIAGNOSIS — G894 Chronic pain syndrome: Secondary | ICD-10-CM

## 2014-03-26 DIAGNOSIS — G4700 Insomnia, unspecified: Secondary | ICD-10-CM

## 2014-03-26 DIAGNOSIS — F411 Generalized anxiety disorder: Secondary | ICD-10-CM

## 2014-03-26 LAB — POCT CBC
Granulocyte percent: 47.1 %G (ref 37–80)
HEMATOCRIT: 41.5 % (ref 37.7–47.9)
Hemoglobin: 13.5 g/dL (ref 12.2–16.2)
Lymph, poc: 2.8 (ref 0.6–3.4)
MCH: 27.8 pg (ref 27–31.2)
MCHC: 32.5 g/dL (ref 31.8–35.4)
MCV: 85.5 fL (ref 80–97)
MID (cbc): 0.3 (ref 0–0.9)
MPV: 7.7 fL (ref 0–99.8)
POC GRANULOCYTE: 2.8 (ref 2–6.9)
POC LYMPH %: 47.2 % (ref 10–50)
POC MID %: 5.7 %M (ref 0–12)
Platelet Count, POC: 327 10*3/uL (ref 142–424)
RBC: 4.85 M/uL (ref 4.04–5.48)
RDW, POC: 14.2 %
WBC: 6 10*3/uL (ref 4.6–10.2)

## 2014-03-26 LAB — GLUCOSE, POCT (MANUAL RESULT ENTRY): POC Glucose: 98 mg/dl (ref 70–99)

## 2014-03-26 LAB — COMPREHENSIVE METABOLIC PANEL
ALK PHOS: 71 U/L (ref 39–117)
ALT: 21 U/L (ref 0–35)
AST: 21 U/L (ref 0–37)
Albumin: 4.4 g/dL (ref 3.5–5.2)
BILIRUBIN TOTAL: 0.5 mg/dL (ref 0.2–1.2)
BUN: 7 mg/dL (ref 6–23)
CO2: 25 meq/L (ref 19–32)
CREATININE: 0.71 mg/dL (ref 0.50–1.10)
Calcium: 9.8 mg/dL (ref 8.4–10.5)
Chloride: 103 mEq/L (ref 96–112)
Glucose, Bld: 105 mg/dL — ABNORMAL HIGH (ref 70–99)
Potassium: 3.6 mEq/L (ref 3.5–5.3)
SODIUM: 138 meq/L (ref 135–145)
TOTAL PROTEIN: 7.5 g/dL (ref 6.0–8.3)

## 2014-03-26 LAB — TSH: TSH: 1.803 u[IU]/mL (ref 0.350–4.500)

## 2014-03-26 MED ORDER — GABAPENTIN 300 MG PO CAPS: 300.0000 mg | ORAL_CAPSULE | Freq: Three times a day (TID) | ORAL | Status: DC

## 2014-03-26 MED ORDER — ALPRAZOLAM 1 MG PO TABS: ORAL_TABLET | ORAL | Status: DC

## 2014-03-26 NOTE — Patient Instructions (Addendum)
I will contact you with your lab results as soon as they are available.   If you have not heard from me in 2 weeks, please contact me.  The fastest way to get your results is to register for My Chart (see the instructions on the last page of this printout).  If you have not heard anything regarding the referral to Dr. Arlyce DiceKaplan and the imaging facility for the CT of the abdomen/pelvis in 1 week, please contact our office.  Restart the gabapentin (Neurontin) 300 mg at bedtime. You may increase the bedtime dose to 600 mg, and you may add 300 mg in the morning and mid-day, if needed. I suspect that this will help you sleep and reduce some of your anxiety.

## 2014-03-26 NOTE — Telephone Encounter (Signed)
Since she has UMR, I think it's cheapest if she has the scan at the hospital, but I don't care which facility.

## 2014-03-26 NOTE — Progress Notes (Signed)
Subjective:    Patient ID: [name], female    DOB: 07-31-1971, 43 y.o.   MRN: [id number]   PCP: Shenekia Riess, PA-C  Chief Complaint  Patient presents with  . Weight Loss    unexplained weight loss pt says she lost 16lbs in 2 weeks    Medications, allergies, past medical history, surgical history, family history, social history and problem list reviewed and updated.  Patient Active Problem List   Diagnosis Date Noted  . Anxiety 03/09/2014  . Obesity (BMI 30-39.9) 10/27/2013  . Neck pain on left side 09/02/2013  . Cervical radiculopathy 08/18/2013  . Chronic pain disorder 08/16/2013  . Drug-seeking behavior 08/16/2013  . Muscle spasm of left shoulder area 08/16/2013  . Lumbar back pain 11/21/2012  . Esophageal stricture 11/21/2012  . ADD (attention deficit disorder) 11/21/2012  . Hypertriglyceridemia 11/21/2012  . Pain 08/06/2012  . Carpal tunnel syndrome, bilateral 07/05/2012  . OSA (obstructive sleep apnea) 06/28/2012  . Constipation 01/01/2012  . Migraine headache 03/02/2011  . Depression 03/02/2011  . History of substance abuse 03/02/2011  . Hypertension 03/02/2011  . Abdominal pain, epigastric 02/20/2011   Prior to Admission medications   Medication Sig Start Date End Date Taking? Authorizing Provider  ALPRAZolam Prudy Feeler) 1 MG tablet Take 1-3 tablets (1-3 mg total) by mouth at bedtime. 03/09/14  Yes Amelia Macken S Aneesh Faller, PA-C  amLODipine (NORVASC) 10 MG tablet Take 1 tablet (10 mg total) by mouth every morning. 09/16/13  Yes Delray Reza S Briget Shaheed, PA-C  amphetamine-dextroamphetamine (ADDERALL) 30 MG tablet Take 0.5 tablets (15 mg total) by mouth 3 (three) times daily. 02/11/14  Yes Kire Ferg S Analise Glotfelty, PA-C  EPINEPHrine (EPIPEN) 0.3 mg/0.3 mL SOAJ injection Inject 0.3 mg into the muscle once as needed (Anaphylaxis).    Yes Historical Provider, MD  Gabapentin (NEURONTIN PO) Take by mouth.   NO Historical Provider, MD  ibuprofen (ADVIL,MOTRIN) 200 MG tablet Take 600 mg by mouth 2  (two) times daily as needed for pain.   Yes Historical Provider, MD  meloxicam (MOBIC) 15 MG tablet Take 1 tablet (15 mg total) by mouth daily. 10/10/13  Yes Ramonica Grigg S Johnnie Moten, PA-C  methocarbamol (ROBAXIN) 750 MG tablet Take 1-2 tablets (750-1,500 mg total) by mouth every 6 (six) hours as needed for muscle spasms. 12/09/13  Yes Zakiah Beckerman S Ronne Stefanski, PA-C  ondansetron (ZOFRAN) 4 MG tablet Take 1 tablet (4 mg total) by mouth every 6 (six) hours. 08/31/13  NO Trevor Mace, PA-C  Oxycodone HCl 20 MG TABS Take 2 tablets (40 mg total) by mouth every 4 (four) hours. 03/09/14  Yes Maly Lemarr S Kilian Schwartz, PA-C  pantoprazole (PROTONIX) 40 MG tablet Take 40 mg by mouth at bedtime.    Yes Historical Provider, MD  promethazine (PHENERGAN) 25 MG tablet Take 1 tablet (25 mg total) by mouth every 6 (six) hours as needed for nausea. 03/09/14  Yes Karlo Goeden S Inara Dike, PA-C  sertraline (ZOLOFT) 100 MG tablet Take 1.5 tablets (150 mg total) by mouth at bedtime. 01/05/14  Yes Reeve Turnley Tessa Lerner, PA-C    HPI  Noticed she'd been losing weight-has dropped 2 clothing sizes-over the past 1-2 months.  Became worried when she realized how much she'd lost over a short period of time. Has been depressed "crashed out" for the past two weeks.  Mostly staying in bed and sleeping.  Not eating much at all.  Staying hydrated. "I didn't think I'd lose weight like that." She's worried that she has cancer.  She is a former  smoker.  S/p TAH. Colonoscopy with Dr. Jarold Motto in 2006, and upper endoscopy/ERCP in 2012 with Dr. Arlyce Dice.  Anxiety has gotten much worse recently.  Feels like she can't leave the house.  Calls her family in tears, "freaking out," but doesn't know what is triggering it. Recognizes her fears are not rational, but cannot stop them.  "When I get up, I don't know what to do."  Even housekeeping chores, grocery shopping, family obligations aren't getting done.  Overwhelming nausea. Feelings that something bad could happen. No energy to do the  chores that need to be done.    Only takes Adderall when she's going to do something-not regularly using it. Ran out of gabapentin >4 weeks ago-prescribed by ortho.  Thinks it was helping, even with her sleep.  No cough. No SOB.  Some dysphagia (noticed in the past couple of weeks-hasn't had esophageal dilatation in "a while"). Increased Nausea and vomiting last week ("I think it was my nerves."  Was associated with "that pain in the pit of my stomach"). No melena or hematochezia.  Had 2 days where she had a large "blow out" stool first thing in the morning, otherwise, no changes in her bowel habits.  Has reduced sodas and sweets at night (ice cream, primarily).  Review of Systems As above.  Pain otherwise seems well controlled on her current regimen. LEFT knee pain is increased today. No CP, SOB, HA, dizziness.    Objective:   Physical Exam  Vitals reviewed. Constitutional: She is oriented to person, place, and time. Vital signs are normal. She appears well-developed and well-nourished. She is active and cooperative. No distress.  BP 124/72  Pulse 91  Temp(Src) 99 F (37.2 C) (Oral)  Resp 16  Ht 5' 6.5" (1.689 m)  Wt 181 lb 12.8 oz (82.464 kg)  BMI 28.91 kg/m2  SpO2 99%  HENT:  Head: Normocephalic and atraumatic.  Right Ear: Hearing normal.  Left Ear: Hearing normal.  Eyes: Conjunctivae are normal. No scleral icterus.  Neck: Normal range of motion. Neck supple. No thyromegaly present.  Cardiovascular: Normal rate, regular rhythm and normal heart sounds.   Pulses:      Radial pulses are 2+ on the right side, and 2+ on the left side.  Pulmonary/Chest: Effort normal and breath sounds normal.  Abdominal: Soft. Normal appearance and bowel sounds are normal. There is tenderness (mild; s/p TAH, appendectomy) in the right lower quadrant. There is no rigidity, no rebound, no guarding, no tenderness at McBurney's point and negative Murphy's sign.  Lymphadenopathy:       Head (right side):  No tonsillar, no preauricular, no posterior auricular and no occipital adenopathy present.       Head (left side): No tonsillar, no preauricular, no posterior auricular and no occipital adenopathy present.    She has no cervical adenopathy.       Right: No supraclavicular adenopathy present.       Left: No supraclavicular adenopathy present.  Neurological: She is alert and oriented to person, place, and time. No sensory deficit.  Skin: Skin is warm, dry and intact. No rash noted. No cyanosis or erythema. Nails show no clubbing.  Psychiatric: She has a normal mood and affect.      Results for orders placed in visit on 03/26/14  POCT CBC      Result Value Ref Range   WBC 6.0  4.6 - 10.2 K/uL   Lymph, poc 2.8  0.6 - 3.4   POC LYMPH PERCENT 47.2  10 - 50 %L   MID (cbc) 0.3  0 - 0.9   POC MID % 5.7  0 - 12 %M   POC Granulocyte 2.8  2 - 6.9   Granulocyte percent 47.1  37 - 80 %G   RBC 4.85  4.04 - 5.48 M/uL   Hemoglobin 13.5  12.2 - 16.2 g/dL   HCT, POC 96.2  95.2 - 47.9 %   MCV 85.5  80 - 97 fL   MCH, POC 27.8  27 - 31.2 pg   MCHC 32.5  31.8 - 35.4 g/dL   RDW, POC 84.1     Platelet Count, POC 327  142 - 424 K/uL   MPV 7.7  0 - 99.8 fL  GLUCOSE, POCT (MANUAL RESULT ENTRY)      Result Value Ref Range   POC Glucose 98  70 - 99 mg/dl   CXR: [facility] reading (PRIMARY) by  Dr. Perrin Maltese.  Increased but non-specific markings on the RIGHT, appear anterior to the heart.  No masses, infiltrates.      Assessment & Plan:  1. Unexplained weight loss I suspect that her anxiety and decreased oral intake have caused this, however, she's concerned that she has cancer, and we need to appropriately evaluate for that.  CT abdomen/pelvis to look at the pancreas and colon, and refer back to GI as she may need a repeat colonoscopy. - POCT CBC - POCT glucose (manual entry) - Comprehensive metabolic panel - TSH - DG Chest 2 View; Future - Ambulatory referral to Gastroenterology - CT Abdomen Pelvis W  Contrast; Future  2. Anxiety 4. Insomnia I suspect that restarting the gabapentin will help some.  Temporarily increase the alprazolam to TID (1 mg QAM, Qmid-day, and continue 3 QHS). - ALPRAZolam (XANAX) 1 MG tablet; 1 PO QAM and Qmid-day prn anxiety, and 1-3 PO QHS.  Dispense: 30 tablet; Refill: 0  3. Chronic pain disorder Stable on the current oxycodone dose.  Restart gabapentin. - gabapentin (NEURONTIN) 300 MG capsule; Take 1 capsule (300 mg total) by mouth 3 (three) times daily.  Dispense: 90 capsule; Refill: 0  She'll follow-up with me in about 10 days, as planned, for her routine visit.  [name], PA-C Physician Assistant-Certified [facility]

## 2014-03-26 NOTE — Telephone Encounter (Signed)
[name] wants to know if you want scan at hospital, please advise.

## 2014-04-02 ENCOUNTER — Encounter: Payer: Self-pay | Admitting: Physician Assistant

## 2014-04-02 DIAGNOSIS — G894 Chronic pain syndrome: Secondary | ICD-10-CM

## 2014-04-03 ENCOUNTER — Encounter: Payer: Self-pay | Admitting: Physician Assistant

## 2014-04-03 ENCOUNTER — Ambulatory Visit (HOSPITAL_COMMUNITY)
Admission: RE | Admit: 2014-04-03 | Discharge: 2014-04-03 | Disposition: A | Discharge status: Home / Self Care | Payer: 59 | Source: Ambulatory Visit | Attending: Physician Assistant | Admitting: Physician Assistant

## 2014-04-03 DIAGNOSIS — R634 Abnormal weight loss: Secondary | ICD-10-CM | POA: Insufficient documentation

## 2014-04-03 MED ORDER — OXYCODONE HCL 20 MG PO TABS
40.0000 mg | ORAL_TABLET | ORAL | Status: DC
Start: 2014-04-03 — End: 2014-05-01

## 2014-04-03 MED ORDER — OXYCODONE HCL 20 MG PO TABS: 40.0000 mg | ORAL_TABLET | ORAL | Status: DC

## 2014-04-03 MED ORDER — IOHEXOL 300 MG/ML  SOLN
80.0000 mL | Freq: Once | INTRAMUSCULAR | Status: AC | PRN
  Administered 2014-04-03: 80 mL via INTRAVENOUS

## 2014-04-03 NOTE — Telephone Encounter (Signed)
Rx in drawer. 

## 2014-04-03 NOTE — Telephone Encounter (Signed)
Rx printed. Patient notified via My Chart.   Meds ordered this encounter  Medications  . Oxycodone HCl 20 MG TABS    Sig: Take 2 tablets (40 mg total) by mouth every 4 (four) hours.    Dispense:  168 tablet    Refill:  0    The patient has #21 30 mg tabs and #21 15 mg tabs remaining, that she will reserve and use when she drops the dose down to 35 mg.    Order Specific Question:  Supervising Provider    Answer:  DOOLITTLE, ROBERT P [[id number]]  . Oxycodone HCl 20 MG TABS    Sig: Take 2 tablets (40 mg total) by mouth every 4 (four) hours. May fill 14 days after date on prescription.    Dispense:  168 tablet    Refill:  0    The patient has #21 30 mg tabs and #21 15 mg tabs remaining, that she will reserve and use when she drops the dose down to 35 mg.    Order Specific Question:  Supervising Provider    Answer:  Ellamae Sia P [[id number]]

## 2014-04-05 ENCOUNTER — Encounter: Payer: Self-pay | Admitting: Physician Assistant

## 2014-04-05 NOTE — Telephone Encounter (Signed)
Called stevie already to let her know we sent in rx

## 2014-04-06 ENCOUNTER — Encounter: Payer: Self-pay | Admitting: Physician Assistant

## 2014-04-06 DIAGNOSIS — F419 Anxiety disorder, unspecified: Secondary | ICD-10-CM

## 2014-04-06 DIAGNOSIS — G4700 Insomnia, unspecified: Secondary | ICD-10-CM

## 2014-04-07 ENCOUNTER — Encounter: Payer: Self-pay | Admitting: Physician Assistant

## 2014-04-07 ENCOUNTER — Telehealth: Payer: Self-pay

## 2014-04-07 DIAGNOSIS — F419 Anxiety disorder, unspecified: Secondary | ICD-10-CM

## 2014-04-07 DIAGNOSIS — G4700 Insomnia, unspecified: Secondary | ICD-10-CM

## 2014-04-07 NOTE — Telephone Encounter (Signed)
Patient is returning a Clinical cytogeneticist message from chelle. Patient states [name] needed to know how many xanax the patient needs. Patient states she needs 90 pills so that she can take them at night. Thank you.

## 2014-04-08 ENCOUNTER — Encounter: Payer: Self-pay | Admitting: Physician Assistant

## 2014-04-08 MED ORDER — ALPRAZOLAM 1 MG PO TABS: 1.0000 mg | ORAL_TABLET | Freq: Every evening | ORAL | Status: DC | PRN

## 2014-04-08 NOTE — Telephone Encounter (Signed)
I have done and placed it in pick up drawer.

## 2014-04-08 NOTE — Telephone Encounter (Signed)
Please place in the drawer - pt wants to pick up and I have my chart messaged the patient.

## 2014-04-08 NOTE — Telephone Encounter (Signed)
I've asked Benny Lennert to print this for me. Rx is pended for printing.

## 2014-04-08 NOTE — Telephone Encounter (Signed)
Sarah,  Please authorize the alprazolam I've pended for [name].  I gave her an interim supply, we temporarily increased her use, but we're back to her routine dosing. She wants to pick it up today, rather than have [place] call/fax it in.  Thanks, Inga Noller

## 2014-05-01 ENCOUNTER — Ambulatory Visit (INDEPENDENT_AMBULATORY_CARE_PROVIDER_SITE_OTHER): Payer: 59 | Admitting: Physician Assistant

## 2014-05-01 VITALS — BP 108/68 | HR 81 | Temp 98.2°F | Resp 16 | Ht 66.25 in | Wt 189.8 lb

## 2014-05-01 DIAGNOSIS — G4700 Insomnia, unspecified: Secondary | ICD-10-CM

## 2014-05-01 DIAGNOSIS — F411 Generalized anxiety disorder: Secondary | ICD-10-CM

## 2014-05-01 DIAGNOSIS — G894 Chronic pain syndrome: Secondary | ICD-10-CM

## 2014-05-01 DIAGNOSIS — F988 Other specified behavioral and emotional disorders with onset usually occurring in childhood and adolescence: Secondary | ICD-10-CM

## 2014-05-01 DIAGNOSIS — F419 Anxiety disorder, unspecified: Secondary | ICD-10-CM

## 2014-05-01 MED ORDER — ALPRAZOLAM 1 MG PO TABS: 1.0000 mg | ORAL_TABLET | Freq: Every day | ORAL | Status: DC

## 2014-05-01 MED ORDER — AMPHETAMINE-DEXTROAMPHETAMINE 30 MG PO TABS: 15.0000 mg | ORAL_TABLET | Freq: Three times a day (TID) | ORAL | Status: DC

## 2014-05-01 MED ORDER — OXYCODONE HCL 20 MG PO TABS: 40.0000 mg | ORAL_TABLET | ORAL | Status: DC

## 2014-05-01 MED ORDER — ALPRAZOLAM 1 MG PO TABS: 1.0000 mg | ORAL_TABLET | Freq: Every evening | ORAL | Status: DC | PRN

## 2014-05-01 NOTE — Progress Notes (Signed)
Subjective:    Patient ID: [name], female    DOB: 17-Nov-1970, 43 y.o.   MRN: [id number]   PCP: Eilidh Marcano, PA-C  Chief Complaint  Patient presents with  . Medication Refill    oxycodone, adderall , xaNAX    Medications, allergies, past medical history, surgical history, family history, social history and problem list reviewed and updated.  HPI  Weight loss has stopped. Appointment with Dr. Arlyce Dice is in August.  She's having some pulling, mild pain in the RIGHT abdomen, and she would like to see if she can see Dr. Arlyce Dice sooner.  Anxiety has increased again, and she requests a short term increase in xanax frequency. Her next routine prescription isn't due until next week.  She needs a refill of Adderall-doesn't take it every day-last fill 02/2014.  Happy with her current pain regimen.  Isn't ready to reduce the oxycodone dose to 35 mg yet.  Review of Systems As above.    Objective:   Physical Exam  Vitals reviewed. Constitutional: She is oriented to person, place, and time. Vital signs are normal. She appears well-developed and well-nourished. She is active and cooperative. No distress.  BP 108/68  Pulse 81  Temp(Src) 98.2 F (36.8 C) (Oral)  Resp 16  Ht 5' 6.25" (1.683 m)  Wt 189 lb 12.8 oz (86.093 kg)  BMI 30.39 kg/m2  SpO2 99%  HENT:  Head: Normocephalic and atraumatic.  Right Ear: Hearing normal.  Left Ear: Hearing normal.  Eyes: Conjunctivae are normal. No scleral icterus.  Neck: Normal range of motion. Neck supple. No thyromegaly present.  Cardiovascular: Normal rate, regular rhythm and normal heart sounds.   Pulses:      Radial pulses are 2+ on the right side, and 2+ on the left side.  Pulmonary/Chest: Effort normal and breath sounds normal.  Lymphadenopathy:       Head (right side): No tonsillar, no preauricular, no posterior auricular and no occipital adenopathy present.       Head (left side): No tonsillar, no preauricular, no posterior  auricular and no occipital adenopathy present.    She has no cervical adenopathy.       Right: No supraclavicular adenopathy present.       Left: No supraclavicular adenopathy present.  Neurological: She is alert and oriented to person, place, and time. No sensory deficit.  Skin: Skin is warm, dry and intact. No rash noted. No cyanosis or erythema. Nails show no clubbing.  Psychiatric: She has a normal mood and affect.          Assessment & Plan:  1. Chronic pain disorder Stable.  Continue on current dose. - Oxycodone HCl 20 MG TABS; Take 2 tablets (40 mg total) by mouth every 4 (four) hours.  Dispense: 168 tablet; Refill: 0 - Oxycodone HCl 20 MG TABS; Take 2 tablets (40 mg total) by mouth every 4 (four) hours. May fill 14 days after date on prescription.  Dispense: 168 tablet; Refill: 0  2. Insomnia Stable.  Continue current dose. - ALPRAZolam (XANAX) 1 MG tablet; Take 1-3 tablets (1-3 mg total) by mouth at bedtime as needed for anxiety.  Dispense: 90 tablet; Refill: 0  3. Anxiety Increased.  Short term, she'll add a Xanax dose during the day. - ALPRAZolam (XANAX) 1 MG tablet; Take 1 tablet (1 mg total) by mouth daily.  Dispense: 20 tablet; Refill: 0  4. ADD (attention deficit disorder) Stable.  Uses Adderall only PRN. - amphetamine-dextroamphetamine (ADDERALL) 30 MG tablet; Take  0.5 tablets (15 mg total) by mouth 3 (three) times daily.  Dispense: 45 tablet; Refill: 0  Return in about 1 month (around 05/31/2014) for re-evaluation.  [name], PA-C Physician Assistant-Certified [facility]

## 2014-05-11 ENCOUNTER — Other Ambulatory Visit: Payer: Self-pay | Admitting: Physician Assistant

## 2014-05-25 ENCOUNTER — Encounter: Payer: Self-pay | Admitting: Physician Assistant

## 2014-05-25 DIAGNOSIS — G894 Chronic pain syndrome: Secondary | ICD-10-CM

## 2014-05-26 MED ORDER — OXYCODONE HCL 20 MG PO TABS: 40.0000 mg | ORAL_TABLET | ORAL | Status: DC

## 2014-05-26 NOTE — Telephone Encounter (Signed)
Patient notified via My Chart.  Meds ordered this encounter  Medications  . Oxycodone HCl 20 MG TABS    Sig: Take 2 tablets (40 mg total) by mouth every 4 (four) hours.    Dispense:  168 tablet    Refill:  0    The patient has #21 30 mg tabs and #21 15 mg tabs remaining, that she will reserve and use when she drops the dose down to 35 mg.    Order Specific Question:  Supervising Provider    Answer:  DOOLITTLE, ROBERT P [[id number]]  . Oxycodone HCl 20 MG TABS    Sig: Take 2 tablets (40 mg total) by mouth every 4 (four) hours. May fill 14 days after date on prescription.    Dispense:  168 tablet    Refill:  0    The patient has #21 30 mg tabs and #21 15 mg tabs remaining, that she will reserve and use when she drops the dose down to 35 mg.    Order Specific Question:  Supervising Provider    Answer:  Ellamae SiaOLITTLE, ROBERT P [[id number]]

## 2014-05-29 ENCOUNTER — Ambulatory Visit: Payer: 59 | Admitting: Nurse Practitioner

## 2014-06-02 ENCOUNTER — Encounter: Payer: Self-pay | Admitting: Physician Assistant

## 2014-06-03 ENCOUNTER — Ambulatory Visit (INDEPENDENT_AMBULATORY_CARE_PROVIDER_SITE_OTHER): Payer: 59 | Admitting: Physician Assistant

## 2014-06-03 VITALS — BP 122/76 | HR 80 | Temp 98.2°F | Resp 17 | Ht 67.5 in | Wt 195.0 lb

## 2014-06-03 DIAGNOSIS — G43919 Migraine, unspecified, intractable, without status migrainosus: Secondary | ICD-10-CM

## 2014-06-03 DIAGNOSIS — F419 Anxiety disorder, unspecified: Secondary | ICD-10-CM

## 2014-06-03 DIAGNOSIS — J329 Chronic sinusitis, unspecified: Secondary | ICD-10-CM

## 2014-06-03 DIAGNOSIS — K589 Irritable bowel syndrome without diarrhea: Secondary | ICD-10-CM | POA: Insufficient documentation

## 2014-06-03 DIAGNOSIS — J40 Bronchitis, not specified as acute or chronic: Secondary | ICD-10-CM

## 2014-06-03 DIAGNOSIS — G894 Chronic pain syndrome: Secondary | ICD-10-CM

## 2014-06-03 DIAGNOSIS — K222 Esophageal obstruction: Secondary | ICD-10-CM

## 2014-06-03 DIAGNOSIS — G4700 Insomnia, unspecified: Secondary | ICD-10-CM

## 2014-06-03 DIAGNOSIS — F411 Generalized anxiety disorder: Secondary | ICD-10-CM

## 2014-06-03 MED ORDER — GABAPENTIN 300 MG PO CAPS: 300.0000 mg | ORAL_CAPSULE | Freq: Three times a day (TID) | ORAL | Status: DC

## 2014-06-03 MED ORDER — LINACLOTIDE 145 MCG PO CAPS: 145.0000 ug | ORAL_CAPSULE | Freq: Every day | ORAL | Status: DC

## 2014-06-03 MED ORDER — ALPRAZOLAM 1 MG PO TABS: ORAL_TABLET | ORAL | Status: DC

## 2014-06-03 MED ORDER — PANTOPRAZOLE SODIUM 40 MG PO TBEC: 40.0000 mg | DELAYED_RELEASE_TABLET | Freq: Every day | ORAL | Status: DC

## 2014-06-03 MED ORDER — BUSPIRONE HCL 15 MG PO TABS: 15.0000 mg | ORAL_TABLET | Freq: Two times a day (BID) | ORAL | Status: DC

## 2014-06-03 MED ORDER — AMOXICILLIN-POT CLAVULANATE 875-125 MG PO TABS: 1.0000 | ORAL_TABLET | Freq: Two times a day (BID) | ORAL | Status: DC

## 2014-06-03 MED ORDER — PROMETHAZINE HCL 25 MG PO TABS: 25.0000 mg | ORAL_TABLET | Freq: Four times a day (QID) | ORAL | Status: DC | PRN

## 2014-06-03 NOTE — Patient Instructions (Signed)
I will be here Monday 8/10 and Tuesday 8/11 9a-9p, and Friday 8/14 9a-12noon. Beginning Monday 8/17, I'll be here 9a-9p on Mondays and Wednesdays, and open-close every other Fri-Sat-Sun (I work 8/21-23).

## 2014-06-03 NOTE — Progress Notes (Signed)
Subjective:    Patient ID: [name], female    DOB: [birth year], 43 y.o.   MRN: [id number]   PCP: Dev Dhondt, PA-C  Chief Complaint  Patient presents with  . Cough  . Sore Throat  . Headache  . Nausea    Medications, allergies, past medical history, surgical history, family history, social history and problem list reviewed and updated.  HPI  "creeping crud."  Started last Thursday (05/28/2014) night.  Tmax 100.  Intermittent fever. "LEFT tonsil is swollen with some small stones in it."  Intermittent nausea. No vomiting. No diarrhea. Cough started late last night, non-productive.  Feels congested, runny nose.  No facial pressure/pain.  Has taken a couple of leftover Amoxicillin tabs her partner had leftover.  Needs refills of several of her chronic medications.  Her partner notes that the increased frequency of Xanax "really makes a difference."  Without it, Dudley doesn't want to leave the house, and when she does, get very anxious and has to return within an hour or two.  With a dose of Xanax, she can be out for 4-6 hours.  Review of Systems As above.    Objective:   Physical Exam  Vitals reviewed. Constitutional: She is oriented to person, place, and time. Vital signs are normal. She appears well-developed and well-nourished. No distress.  HENT:  Head: Normocephalic and atraumatic.  Right Ear: Hearing, tympanic membrane, external ear and ear canal normal.  Left Ear: Hearing, tympanic membrane, external ear and ear canal normal.  Nose: Mucosal edema and rhinorrhea present.  No foreign bodies. Right sinus exhibits no maxillary sinus tenderness and no frontal sinus tenderness. Left sinus exhibits no maxillary sinus tenderness and no frontal sinus tenderness.  Mouth/Throat: Uvula is midline, oropharynx is clear and moist and mucous membranes are normal. No uvula swelling. No oropharyngeal exudate.  Eyes: Conjunctivae and EOM are normal. Pupils are equal, round, and reactive to  light. Right eye exhibits no discharge. Left eye exhibits no discharge. No scleral icterus.  Neck: Trachea normal, normal range of motion and full passive range of motion without pain. Neck supple. No mass and no thyromegaly present.  Cardiovascular: Normal rate, regular rhythm and normal heart sounds.   Pulmonary/Chest: Effort normal. No respiratory distress. She has wheezes (coarse, scattered; improve with deep breathing.). She has no rales. She exhibits no tenderness.  Lymphadenopathy:       Head (right side): No submandibular, no tonsillar, no preauricular, no posterior auricular and no occipital adenopathy present.       Head (left side): No submandibular, no tonsillar, no preauricular and no occipital adenopathy present.    She has no cervical adenopathy.       Right: No supraclavicular adenopathy present.       Left: No supraclavicular adenopathy present.  Neurological: She is alert and oriented to person, place, and time. She has normal strength. No cranial nerve deficit or sensory deficit.  Skin: Skin is warm, dry and intact. No rash noted.  Psychiatric: She has a normal mood and affect. Her speech is normal and behavior is normal.          Assessment & Plan:  1. Sinobronchitis Likely secondary following viral URI. Supportive care. Anticipatory guidance. - amoxicillin-clavulanate (AUGMENTIN) 875-125 MG per tablet; Take 1 tablet by mouth 2 (two) times daily.  Dispense: 20 tablet; Refill: 0  2. Intractable migraine without status migrainosus, unspecified migraine type She uses phenergan prn for associated nausea. - promethazine (PHENERGAN) 25 MG tablet; Take 1  tablet (25 mg total) by mouth every 6 (six) hours as needed for nausea.  Dispense: 30 tablet; Refill: 1  3. Chronic pain disorder Stable. - gabapentin (NEURONTIN) 300 MG capsule; Take 1 capsule (300 mg total) by mouth 3 (three) times daily.  Dispense: 90 capsule; Refill: 0  4. Insomnia Chronic, stable. - ALPRAZolam  (XANAX) 1 MG tablet; Take 1 tablet as needed Morning and mid-day; take 1-3 tablets at bedtime as needed.  Dispense: 105 tablet; Refill: 0  5. Anxiety Worsening.  Discussed the long-term problem with routine alprazolam and my concern that her insomnia will worsen and require higher doses.  Try adding Buspar.  For now, OK to continue PRN use of xanax during the day. - ALPRAZolam (XANAX) 1 MG tablet; Take 1 tablet as needed Morning and mid-day; take 1-3 tablets at bedtime as needed.  Dispense: 105 tablet; Refill: 0 - busPIRone (BUSPAR) 15 MG tablet; Take 1 tablet (15 mg total) by mouth 2 (two) times daily.  Dispense: 60 tablet; Refill: 0  6. Esophageal stricture Stable.  Follow up with GI as needed. - pantoprazole (PROTONIX) 40 MG tablet; Take 1 tablet (40 mg total) by mouth at bedtime.  Dispense: 90 tablet; Refill: 3  7. IBS (irritable bowel syndrome) Stable.  Follow up with GI as needed. - Linaclotide (LINZESS) 145 MCG CAPS capsule; Take 1 capsule (145 mcg total) by mouth daily.  Dispense: 90 capsule; Refill: 3   [name], PA-C Physician Assistant-Certified [facility]

## 2014-06-07 ENCOUNTER — Emergency Department (HOSPITAL_COMMUNITY)
Admission: EM | Admit: 2014-06-07 | Discharge: 2014-06-07 | Disposition: A | Discharge status: Home / Self Care | Payer: 59 | Attending: Emergency Medicine | Admitting: Emergency Medicine

## 2014-06-07 ENCOUNTER — Encounter (HOSPITAL_COMMUNITY): Payer: Self-pay | Admitting: Emergency Medicine

## 2014-06-07 DIAGNOSIS — Z87891 Personal history of nicotine dependence: Secondary | ICD-10-CM | POA: Insufficient documentation

## 2014-06-07 DIAGNOSIS — F329 Major depressive disorder, single episode, unspecified: Secondary | ICD-10-CM | POA: Insufficient documentation

## 2014-06-07 DIAGNOSIS — Z9981 Dependence on supplemental oxygen: Secondary | ICD-10-CM | POA: Insufficient documentation

## 2014-06-07 DIAGNOSIS — G43909 Migraine, unspecified, not intractable, without status migrainosus: Secondary | ICD-10-CM | POA: Insufficient documentation

## 2014-06-07 DIAGNOSIS — Z9889 Other specified postprocedural states: Secondary | ICD-10-CM | POA: Insufficient documentation

## 2014-06-07 DIAGNOSIS — W010XXA Fall on same level from slipping, tripping and stumbling without subsequent striking against object, initial encounter: Secondary | ICD-10-CM | POA: Insufficient documentation

## 2014-06-07 DIAGNOSIS — Y929 Unspecified place or not applicable: Secondary | ICD-10-CM | POA: Insufficient documentation

## 2014-06-07 DIAGNOSIS — F3289 Other specified depressive episodes: Secondary | ICD-10-CM | POA: Insufficient documentation

## 2014-06-07 DIAGNOSIS — G4730 Sleep apnea, unspecified: Secondary | ICD-10-CM | POA: Insufficient documentation

## 2014-06-07 DIAGNOSIS — M545 Low back pain, unspecified: Secondary | ICD-10-CM

## 2014-06-07 DIAGNOSIS — Z792 Long term (current) use of antibiotics: Secondary | ICD-10-CM | POA: Insufficient documentation

## 2014-06-07 DIAGNOSIS — Z79899 Other long term (current) drug therapy: Secondary | ICD-10-CM | POA: Insufficient documentation

## 2014-06-07 DIAGNOSIS — IMO0002 Reserved for concepts with insufficient information to code with codable children: Secondary | ICD-10-CM | POA: Insufficient documentation

## 2014-06-07 DIAGNOSIS — Z8781 Personal history of (healed) traumatic fracture: Secondary | ICD-10-CM | POA: Insufficient documentation

## 2014-06-07 DIAGNOSIS — I10 Essential (primary) hypertension: Secondary | ICD-10-CM | POA: Insufficient documentation

## 2014-06-07 DIAGNOSIS — Z87442 Personal history of urinary calculi: Secondary | ICD-10-CM | POA: Insufficient documentation

## 2014-06-07 DIAGNOSIS — M129 Arthropathy, unspecified: Secondary | ICD-10-CM | POA: Insufficient documentation

## 2014-06-07 DIAGNOSIS — Y9389 Activity, other specified: Secondary | ICD-10-CM | POA: Insufficient documentation

## 2014-06-07 DIAGNOSIS — K219 Gastro-esophageal reflux disease without esophagitis: Secondary | ICD-10-CM | POA: Insufficient documentation

## 2014-06-07 DIAGNOSIS — Z8619 Personal history of other infectious and parasitic diseases: Secondary | ICD-10-CM | POA: Insufficient documentation

## 2014-06-07 DIAGNOSIS — F411 Generalized anxiety disorder: Secondary | ICD-10-CM | POA: Insufficient documentation

## 2014-06-07 MED ORDER — HYDROMORPHONE HCL PF 2 MG/ML IJ SOLN
2.0000 mg | Freq: Once | INTRAMUSCULAR | Status: AC
  Administered 2014-06-07: 2 mg via INTRAVENOUS
  Filled 2014-06-07: qty 1

## 2014-06-07 NOTE — ED Provider Notes (Signed)
CSN: [id number]     Arrival date & time 06/07/14  1530 History  This chart was scribed for non-physician practitioner, Elpidio Anis, PA-C working with Denise Chick, MD by Greggory Stallion, ED scribe. This patient was seen in room WTR7/WTR7 and the patient's care was started at 5:30 PM.   Chief Complaint  Patient presents with  . Back Pain   The history is provided by the patient. No language interpreter was used.   HPI Comments: [name] is a 43 y.o. female who presents to the Emergency Department complaining of a fall that occurred earlier today. States she was carrying a couch, tripped and jarred her back. She has sudden onset lower back pain. Certain movements worsen the pain. Denies other injury.   Past Medical History  Diagnosis Date  . Anxiety   . GERD (gastroesophageal reflux disease)   . History of alcohol abuse     no alcohol since 2004  . Migraine headache   . History of cocaine abuse     none since 2011, entered treatment  . History of kidney stones   . Arthritis     left knee  . Depression   . Complication of anesthesia 03/2011    history of aspiration after anesthesia  . History of esophageal dilatation     multiple  . History of pancreatitis 2012  . Hypertension     under control with med., has been on med. x 3 yr.  . Sleep apnea     only uses CPAP machine 3 x/week  . Medial meniscus tear 07/2013    right  . Cervical muscle strain 07/2013    current Prednisone taper  . Dental crowns present     x 2 upper front  . Genital herpes simplex type 2   . Herniated disc, cervical    Past Surgical History  Procedure Laterality Date  . Abdominal hysterectomy      partial  . Cesarean section      x 2  . Nasal septum surgery    . Carpal tunnel release  07/29/2012    Procedure: CARPAL TUNNEL RELEASE;  Surgeon: Eldred Manges, MD;  Location: [facility];  Service: Orthopedics;  Laterality: Right;  Right carpal tunnel release  . Laparoscopic appendectomy   02/21/2006  . Laparoscopic lysis of adhesions  02/21/2006  . Cholecystectomy  04/05/2010  . Ercp w/ metal stent placement  03/14/2011  . Cystoscopy w/ ureteral stent placement Right 06/11/2007  . Nasal septoplasty w/ turbinoplasty Bilateral 07/26/2009    bilat. turb. reduction  . Knee arthroscopy Left     x 3  . Knee arthroscopy with medial menisectomy Right 07/31/2013    Procedure: KNEE ARTHROSCOPY WITH PARTIAL LATERAL MENISECTOMY, CHONDROPLASTY;  Surgeon: Loreta Ave, MD;  Location: [facility];  Service: Orthopedics;  Laterality: Right;   Family History  Problem Relation Age of Onset  . Heart disease Maternal Uncle   . Heart disease Maternal Grandmother   . Kidney cancer Mother   . Hypertension Mother   . Cancer Mother     renal  . Lung cancer Maternal Aunt   . Stroke Maternal Uncle 55  . Alcohol abuse Maternal Uncle    History  Substance Use Topics  . Smoking status: Former Smoker    Quit date: 06/05/2013  . Smokeless tobacco: Never Used  . Alcohol Use: No     Comment: none since 2004   OB History   Grav Para  Term Preterm Abortions TAB SAB Ect Mult Living                 Review of Systems  Musculoskeletal: Positive for back pain.  All other systems reviewed and are negative.  Allergies  Imitrex; Trazodone and nefazodone; Wellbutrin; Effexor; Metoclopramide hcl; Morphine and related; Tegretol; and Topamax  Home Medications   Prior to Admission medications   Medication Sig Start Date End Date Taking? Authorizing Provider  acetaminophen (TYLENOL) 500 MG tablet Take 1,000 mg by mouth every 6 (six) hours as needed for moderate pain.   Yes Historical Provider, MD  ALPRAZolam Prudy Feeler) 1 MG tablet Take 1 tablet as needed Morning and mid-day; take 1-3 tablets at bedtime as needed. 06/03/14  Yes [name] S Jeffery, PA-C  amLODipine (NORVASC) 10 MG tablet Take 10 mg by mouth at bedtime.   Yes Historical Provider, MD  amoxicillin-clavulanate (AUGMENTIN) 875-125 MG per  tablet Take 1 tablet by mouth 2 (two) times daily. 06/03/14  Yes [name] S Jeffery, PA-C  amphetamine-dextroamphetamine (ADDERALL) 30 MG tablet Take 0.5 tablets (15 mg total) by mouth 3 (three) times daily. 05/01/14  Yes [name] S Jeffery, PA-C  busPIRone (BUSPAR) 15 MG tablet Take 1 tablet (15 mg total) by mouth 2 (two) times daily. 06/03/14  Yes [name] S Jeffery, PA-C  EPINEPHrine (EPIPEN) 0.3 mg/0.3 mL SOAJ injection Inject 0.3 mg into the muscle once as needed (Anaphylaxis).    Yes Historical Provider, MD  gabapentin (NEURONTIN) 300 MG capsule Take 1 capsule (300 mg total) by mouth 3 (three) times daily. 06/03/14  Yes [name] S Jeffery, PA-C  ibuprofen (ADVIL,MOTRIN) 200 MG tablet Take 600 mg by mouth 2 (two) times daily as needed for pain.   Yes Historical Provider, MD  Linaclotide Karlene Einstein) 145 MCG CAPS capsule Take 1 capsule (145 mcg total) by mouth daily. 06/03/14  Yes [name] S Jeffery, PA-C  methocarbamol (ROBAXIN) 750 MG tablet TAKE 1-2 BY MOUTH EVERY 6 HOURS AS NEEDED FOR MUSCLE SPASMS   Yes [name] S Jeffery, PA-C  ondansetron (ZOFRAN) 4 MG tablet Take 1 tablet (4 mg total) by mouth every 6 (six) hours. 08/31/13  Yes Trevor Mace, PA-C  Oxycodone HCl 20 MG TABS Take 2 tablets (40 mg total) by mouth every 4 (four) hours. May fill 14 days after date on prescription. 05/26/14  Yes [name] S Jeffery, PA-C  pantoprazole (PROTONIX) 40 MG tablet Take 1 tablet (40 mg total) by mouth at bedtime. 06/03/14  Yes [name] S Jeffery, PA-C  promethazine (PHENERGAN) 25 MG tablet Take 1 tablet (25 mg total) by mouth every 6 (six) hours as needed for nausea. 06/03/14  Yes [name] S Jeffery, PA-C  sertraline (ZOLOFT) 100 MG tablet Take 1.5 tablets (150 mg total) by mouth at bedtime. 01/05/14  Yes [name] S Jeffery, PA-C   BP 132/86  Pulse 98  Temp(Src) 98.1 F (36.7 C) (Oral)  Resp 16  SpO2 99%  Physical Exam  Nursing note and vitals reviewed. Constitutional: She is oriented to person, place, and time. She appears  well-developed and well-nourished. No distress.  HENT:  Head: Normocephalic and atraumatic.  Eyes: Conjunctivae and EOM are normal.  Neck: Neck supple. No tracheal deviation present.  Cardiovascular: Normal rate.   Pulmonary/Chest: Effort normal. No respiratory distress.  Musculoskeletal: Normal range of motion.  Midline lumbar tenderness.  Neurological: She is alert and oriented to person, place, and time.  Ambulatory.  Skin: Skin is warm and dry.  Psychiatric: She has a normal mood and  affect. Her behavior is normal.    ED Course  Procedures (including critical care time)  DIAGNOSTIC STUDIES: Oxygen Saturation is 99% on RA, normal by my interpretation.    COORDINATION OF CARE: 5:33 PM-Discussed treatment plan which includes taking daily medications with pt at bedside and pt agreed to plan.   Labs Review Labs Reviewed - No data to display  Imaging Review No results found.   EKG Interpretation None      MDM   Final diagnoses:  None    1. Low back pain, acute on chronic  She is feeling better with medications. No neurologic deficits. Stable for discharge.   I personally performed the services described in this documentation, which was scribed in my presence. The recorded information has been reviewed and is accurate.  Arnoldo HookerShari A Orbie Grupe, PA-C 06/09/14 2244

## 2014-06-07 NOTE — ED Notes (Signed)
She states she tripped and fell while carrying a couch; falling backward onto buttocks.  She c/o non-radiating low back pain.  She is able to slowly and capably ambulate.  She is here with a female friend.

## 2014-06-07 NOTE — Discharge Instructions (Signed)
Back Injury Prevention Back injuries can be extremely painful and difficult to heal. After having one back injury, you are much more likely to experience another later on. It is important to learn how to avoid injuring or re-injuring your back. The following tips can help you to prevent a back injury. PHYSICAL FITNESS  Exercise regularly and try to develop good tone in your abdominal muscles. Your abdominal muscles provide a lot of the support needed by your back.  Do aerobic exercises (walking, jogging, biking, swimming) regularly.  Do exercises that increase balance and strength (tai chi, yoga) regularly. This can decrease your risk of falling and injuring your back.  Stretch before and after exercising.  Maintain a healthy weight. The more you weigh, the more stress is placed on your back. For every pound of weight, 10 times that amount of pressure is placed on the back. DIET  Talk to your caregiver about how much calcium and vitamin D you need per day. These nutrients help to prevent weakening of the bones (osteoporosis). Osteoporosis can cause broken (fractured) bones that lead to back pain.  Include good sources of calcium in your diet, such as dairy products, green, leafy vegetables, and products with calcium added (fortified).  Include good sources of vitamin D in your diet, such as milk and foods that are fortified with vitamin D.  Consider taking a nutritional supplement or a multivitamin if needed.  Stop smoking if you smoke. POSTURE  Sit and stand up straight. Avoid leaning forward when you sit or hunching over when you stand.  Choose chairs with good low back (lumbar) support.  If you work at a desk, sit close to your work so you do not need to lean over. Keep your chin tucked in. Keep your neck drawn back and elbows bent at a right angle. Your arms should look like the letter "L."  Sit high and close to the steering wheel when you drive. Add a lumbar support to your car  seat if needed.  Avoid sitting or standing in one position for too long. Take breaks to get up, stretch, and walk around at least once every hour. Take breaks if you are driving for long periods of time.  Sleep on your side with your knees slightly bent, or sleep on your back with a pillow under your knees. Do not sleep on your stomach. LIFTING, TWISTING, AND REACHING  Avoid heavy lifting, especially repetitive lifting. If you must do heavy lifting:  Stretch before lifting.  Work slowly.  Rest between lifts.  Use carts and dollies to move objects when possible.  Make several small trips instead of carrying 1 heavy load.  Ask for help when you need it.  Ask for help when moving big, awkward objects.  Follow these steps when lifting:  Stand with your feet shoulder-width apart.  Get as close to the object as you can. Do not try to pick up heavy objects that are far from your body.  Use handles or lifting straps if they are available.  Bend at your knees. Squat down, but keep your heels off the floor.  Keep your shoulders pulled back, your chin tucked in, and your back straight.  Lift the object slowly, tightening the muscles in your legs, abdomen, and buttocks. Keep the object as close to the center of your body as possible.  When you put a load down, use these same guidelines in reverse.  Do not:  Lift the object above your waist.  Twist at the waist while lifting or carrying a load. Move your feet if you need to turn, not your waist.  Bend over without bending at your knees.  Avoid reaching over your head, across a table, or for an object on a high surface. OTHER TIPS  Avoid wet floors and keep sidewalks clear of ice to prevent falls.  Do not sleep on a mattress that is too soft or too hard.  Keep items that are used frequently within easy reach.  Put heavier objects on shelves at waist level and lighter objects on lower or higher shelves.  Find ways to  decrease your stress, such as exercise, massage, or relaxation techniques. Stress can build up in your muscles. Tense muscles are more vulnerable to injury.  Seek treatment for depression or anxiety if needed. These conditions can increase your risk of developing back pain. SEEK MEDICAL CARE IF:  You injure your back.  You have questions about diet, exercise, or other ways to prevent back injuries. MAKE SURE YOU:  Understand these instructions.  Will watch your condition.  Will get help right away if you are not doing well or get worse. Document Released: 11/30/2004 Document Revised: 01/15/2012 Document Reviewed: 12/04/2011 [facility] Patient Information 2015 [place], [place]. This information is not intended to replace advice given to you by your health care provider. Make sure you discuss any questions you have with your health care provider. Cryotherapy Cryotherapy means treatment with cold. Ice or gel packs can be used to reduce both pain and swelling. Ice is the most helpful within the first 24 to 48 hours after an injury or flare-up from overusing a muscle or joint. Sprains, strains, spasms, burning pain, shooting pain, and aches can all be eased with ice. Ice can also be used when recovering from surgery. Ice is effective, has very few side effects, and is safe for most people to use. PRECAUTIONS  Ice is not a safe treatment option for people with:  Raynaud phenomenon. This is a condition affecting small blood vessels in the extremities. Exposure to cold may cause your problems to return.  Cold hypersensitivity. There are many forms of cold hypersensitivity, including:  Cold urticaria. Red, itchy hives appear on the skin when the tissues begin to warm after being iced.  Cold erythema. This is a red, itchy rash caused by exposure to cold.  Cold hemoglobinuria. Red blood cells break down when the tissues begin to warm after being iced. The hemoglobin that carry oxygen are passed into  the urine because they cannot combine with blood proteins fast enough.  Numbness or altered sensitivity in the area being iced. If you have any of the following conditions, do not use ice until you have discussed cryotherapy with your caregiver:  Heart conditions, such as arrhythmia, angina, or chronic heart disease.  High blood pressure.  Healing wounds or open skin in the area being iced.  Current infections.  Rheumatoid arthritis.  Poor circulation.  Diabetes. Ice slows the blood flow in the region it is applied. This is beneficial when trying to stop inflamed tissues from spreading irritating chemicals to surrounding tissues. However, if you expose your skin to cold temperatures for too long or without the proper protection, you can damage your skin or nerves. Watch for signs of skin damage due to cold. HOME CARE INSTRUCTIONS Follow these tips to use ice and cold packs safely.  Place a dry or damp towel between the ice and skin. A damp towel will cool the  skin more quickly, so you may need to shorten the time that the ice is used.  For a more rapid response, add gentle compression to the ice.  Ice for no more than 10 to 20 minutes at a time. The bonier the area you are icing, the less time it will take to get the benefits of ice.  Check your skin after 5 minutes to make sure there are no signs of a poor response to cold or skin damage.  Rest 20 minutes or more between uses.  Once your skin is numb, you can end your treatment. You can test numbness by very lightly touching your skin. The touch should be so light that you do not see the skin dimple from the pressure of your fingertip. When using ice, most people will feel these normal sensations in this order: cold, burning, aching, and numbness.  Do not use ice on someone who cannot communicate their responses to pain, such as small children or people with dementia. HOW TO MAKE AN ICE PACK Ice packs are the most common way to  use ice therapy. Other methods include ice massage, ice baths, and cryosprays. Muscle creams that cause a cold, tingly feeling do not offer the same benefits that ice offers and should not be used as a substitute unless recommended by your caregiver. To make an ice pack, do one of the following:  Place crushed ice or a bag of frozen vegetables in a sealable plastic bag. Squeeze out the excess air. Place this bag inside another plastic bag. Slide the bag into a pillowcase or place a damp towel between your skin and the bag.  Mix 3 parts water with 1 part rubbing alcohol. Freeze the mixture in a sealable plastic bag. When you remove the mixture from the freezer, it will be slushy. Squeeze out the excess air. Place this bag inside another plastic bag. Slide the bag into a pillowcase or place a damp towel between your skin and the bag. SEEK MEDICAL CARE IF:  You develop white spots on your skin. This may give the skin a blotchy (mottled) appearance.  Your skin turns blue or pale.  Your skin becomes waxy or hard.  Your swelling gets worse. MAKE SURE YOU:   Understand these instructions.  Will watch your condition.  Will get help right away if you are not doing well or get worse. Document Released: 06/19/2011 Document Revised: 03/09/2014 Document Reviewed: 06/19/2011 [facility] Patient Information 2015 [place], [place]. This information is not intended to replace advice given to you by your health care provider. Make sure you discuss any questions you have with your health care provider.

## 2014-06-07 NOTE — ED Notes (Signed)
Pt ambulatory to exam room with steady gait.  

## 2014-06-09 NOTE — ED Provider Notes (Signed)
Medical screening examination/treatment/procedure(s) were performed by non-physician practitioner and as supervising physician I was immediately available for consultation/collaboration.   EKG Interpretation None       Ethelda ChickMartha K Linker, MD 06/09/14 2249

## 2014-06-10 ENCOUNTER — Ambulatory Visit: Payer: 59 | Admitting: Nurse Practitioner

## 2014-06-10 ENCOUNTER — Inpatient Hospital Stay
Admission: RE | Admit: 2014-06-10 | Discharge: 2014-06-10 | Disposition: A | Discharge status: Home / Self Care | Payer: Self-pay | Source: Ambulatory Visit | Attending: Family Medicine | Admitting: Family Medicine

## 2014-06-10 ENCOUNTER — Ambulatory Visit (INDEPENDENT_AMBULATORY_CARE_PROVIDER_SITE_OTHER): Payer: 59

## 2014-06-10 ENCOUNTER — Ambulatory Visit (INDEPENDENT_AMBULATORY_CARE_PROVIDER_SITE_OTHER): Payer: 59 | Admitting: Family Medicine

## 2014-06-10 VITALS — BP 110/78 | HR 90 | Temp 98.1°F | Resp 16 | Ht 65.5 in | Wt 192.2 lb

## 2014-06-10 DIAGNOSIS — R5383 Other fatigue: Secondary | ICD-10-CM

## 2014-06-10 DIAGNOSIS — R05 Cough: Secondary | ICD-10-CM

## 2014-06-10 DIAGNOSIS — R5381 Other malaise: Secondary | ICD-10-CM

## 2014-06-10 DIAGNOSIS — R059 Cough, unspecified: Secondary | ICD-10-CM

## 2014-06-10 DIAGNOSIS — S8000XA Contusion of unspecified knee, initial encounter: Secondary | ICD-10-CM

## 2014-06-10 DIAGNOSIS — S8002XA Contusion of left knee, initial encounter: Secondary | ICD-10-CM

## 2014-06-10 LAB — POCT CBC
Granulocyte percent: 51.6 %G (ref 37–80)
HCT, POC: 40 % (ref 37.7–47.9)
HEMOGLOBIN: 12.8 g/dL (ref 12.2–16.2)
LYMPH, POC: 2.8 (ref 0.6–3.4)
MCH, POC: 27.8 pg (ref 27–31.2)
MCHC: 32 g/dL (ref 31.8–35.4)
MCV: 86.8 fL (ref 80–97)
MID (cbc): 0.5 (ref 0–0.9)
MPV: 6.5 fL (ref 0–99.8)
POC GRANULOCYTE: 3.6 (ref 2–6.9)
POC LYMPH PERCENT: 40.9 %L (ref 10–50)
POC MID %: 7.5 %M (ref 0–12)
Platelet Count, POC: 274 10*3/uL (ref 142–424)
RBC: 4.61 M/uL (ref 4.04–5.48)
RDW, POC: 14.1 %
WBC: 6.9 10*3/uL (ref 4.6–10.2)

## 2014-06-10 MED ORDER — BENZONATATE 100 MG PO CAPS: 100.0000 mg | ORAL_CAPSULE | Freq: Three times a day (TID) | ORAL | Status: DC | PRN

## 2014-06-10 NOTE — Progress Notes (Signed)
[facility] [address] [place] [address] [phone]  Date:  06/10/2014   Name:  [name]   DOB:  07-16-1971   MRN:  [id number]  PCP:  JEFFERY,[name], PA-C    Chief Complaint: Follow-up and Fall   History of Present Illness:  [name] is a 43 y.o. very pleasant female patient who presents with the following:  She was seen here on 7/29 with complaint of low grade fever, cough, some nausea.  Started on augmentin for 10 days.   She notes that she is "feeling more SOB," notes a headache, fatigue, some diarrhea.  She has not noted a fever, but can feel cold and hot.   She does still have some cough, but it is dry.    Last CXR was in May of this year Also, on the way here today she fell onto her left knee and notes that it is now "making a cracking noise."  She does not think the knee is seriously injured but thought she should mention it.    Patient Active Problem List   Diagnosis Date Noted  . IBS (irritable bowel syndrome) 06/03/2014  . Anxiety 03/09/2014  . Obesity (BMI 30-39.9) 10/27/2013  . Neck pain on left side 09/02/2013  . Cervical radiculopathy 08/18/2013  . Chronic pain disorder 08/16/2013  . Drug-seeking behavior 08/16/2013  . Muscle spasm of left shoulder area 08/16/2013  . Lumbar back pain 11/21/2012  . Esophageal stricture 11/21/2012  . ADD (attention deficit disorder) 11/21/2012  . Hypertriglyceridemia 11/21/2012  . Pain 08/06/2012  . Carpal tunnel syndrome, bilateral 07/05/2012  . OSA (obstructive sleep apnea) 06/28/2012  . Constipation 01/01/2012  . Migraine headache 03/02/2011  . Depression 03/02/2011  . History of substance abuse 03/02/2011  . Hypertension 03/02/2011  . Abdominal pain, epigastric 02/20/2011    Past Medical History  Diagnosis Date  . Anxiety   . GERD (gastroesophageal reflux disease)   . History of alcohol abuse     no alcohol since 2004  . Migraine headache   . History of cocaine abuse     none  since 2011, entered treatment  . History of kidney stones   . Arthritis     left knee  . Depression   . Complication of anesthesia 03/2011    history of aspiration after anesthesia  . History of esophageal dilatation     multiple  . History of pancreatitis 2012  . Hypertension     under control with med., has been on med. x 3 yr.  . Sleep apnea     only uses CPAP machine 3 x/week  . Medial meniscus tear 07/2013    right  . Cervical muscle strain 07/2013    current Prednisone taper  . Dental crowns present     x 2 upper front  . Genital herpes simplex type 2   . Herniated disc, cervical     Past Surgical History  Procedure Laterality Date  . Abdominal hysterectomy      partial  . Cesarean section      x 2  . Nasal septum surgery    . Carpal tunnel release  07/29/2012    Procedure: CARPAL TUNNEL RELEASE;  Surgeon: Eldred Manges, MD;  Location:  SURGERY CENTER;  Service: Orthopedics;  Laterality: Right;  Right carpal tunnel release  . Laparoscopic appendectomy  02/21/2006  . Laparoscopic lysis of adhesions  02/21/2006  . Cholecystectomy  04/05/2010  . Ercp  w/ metal stent placement  03/14/2011  . Cystoscopy w/ ureteral stent placement Right 06/11/2007  . Nasal septoplasty w/ turbinoplasty Bilateral 07/26/2009    bilat. turb. reduction  . Knee arthroscopy Left     x 3  . Knee arthroscopy with medial menisectomy Right 07/31/2013    Procedure: KNEE ARTHROSCOPY WITH PARTIAL LATERAL MENISECTOMY, CHONDROPLASTY;  Surgeon: Loreta Ave, MD;  Location: [facility];  Service: Orthopedics;  Laterality: Right;    History  Substance Use Topics  . Smoking status: Former Smoker    Quit date: 06/05/2013  . Smokeless tobacco: Never Used  . Alcohol Use: No     Comment: none since 2004    Family History  Problem Relation Age of Onset  . Heart disease Maternal Uncle   . Heart disease Maternal Grandmother   . Kidney cancer Mother   . Hypertension Mother   . Cancer  Mother     renal  . Lung cancer Maternal Aunt   . Stroke Maternal Uncle 55  . Alcohol abuse Maternal Uncle     Allergies  Allergen Reactions  . Imitrex [Sumatriptan Base] Shortness Of Breath and Other (See Comments)    TACHYCARDIA  . Trazodone And Nefazodone Other (See Comments)    NIGHTMARES  . Wellbutrin [Bupropion] Other (See Comments)    "crawling out of my skin"  . Effexor [Venlafaxine] Other (See Comments)    MAKES HER "JITTERY"  . Metoclopramide Hcl Itching  . Morphine And Related Itching  . Tegretol [Carbamazepine] Itching  . Topamax Itching    Medication list has been reviewed and updated.  Current Outpatient Prescriptions on File Prior to Visit  Medication Sig Dispense Refill  . acetaminophen (TYLENOL) 500 MG tablet Take 1,000 mg by mouth every 6 (six) hours as needed for moderate pain.      Marland Kitchen ALPRAZolam (XANAX) 1 MG tablet Take 1 tablet as needed Morning and mid-day; take 1-3 tablets at bedtime as needed.  105 tablet  0  . amLODipine (NORVASC) 10 MG tablet Take 10 mg by mouth at bedtime.      Marland Kitchen amoxicillin-clavulanate (AUGMENTIN) 875-125 MG per tablet Take 1 tablet by mouth 2 (two) times daily.  20 tablet  0  . amphetamine-dextroamphetamine (ADDERALL) 30 MG tablet Take 0.5 tablets (15 mg total) by mouth 3 (three) times daily.  45 tablet  0  . busPIRone (BUSPAR) 15 MG tablet Take 1 tablet (15 mg total) by mouth 2 (two) times daily.  60 tablet  0  . EPINEPHrine (EPIPEN) 0.3 mg/0.3 mL SOAJ injection Inject 0.3 mg into the muscle once as needed (Anaphylaxis).       Marland Kitchen gabapentin (NEURONTIN) 300 MG capsule Take 1 capsule (300 mg total) by mouth 3 (three) times daily.  90 capsule  0  . ibuprofen (ADVIL,MOTRIN) 200 MG tablet Take 600 mg by mouth 2 (two) times daily as needed for pain.      . Linaclotide (LINZESS) 145 MCG CAPS capsule Take 1 capsule (145 mcg total) by mouth daily.  90 capsule  3  . methocarbamol (ROBAXIN) 750 MG tablet TAKE 1-2 BY MOUTH EVERY 6 HOURS AS NEEDED  FOR MUSCLE SPASMS  120 tablet  5  . ondansetron (ZOFRAN) 4 MG tablet Take 1 tablet (4 mg total) by mouth every 6 (six) hours.  4 tablet  0  . Oxycodone HCl 20 MG TABS Take 2 tablets (40 mg total) by mouth every 4 (four) hours. May fill 14 days after date on prescription.  168 tablet  0  . pantoprazole (PROTONIX) 40 MG tablet Take 1 tablet (40 mg total) by mouth at bedtime.  90 tablet  3  . promethazine (PHENERGAN) 25 MG tablet Take 1 tablet (25 mg total) by mouth every 6 (six) hours as needed for nausea.  30 tablet  1  . sertraline (ZOLOFT) 100 MG tablet Take 1.5 tablets (150 mg total) by mouth at bedtime.  45 tablet  5   No current facility-administered medications on file prior to visit.    Review of Systems:  As per HPI- otherwise negative.   Physical Examination: Filed Vitals:   06/10/14 1307  BP: 110/78  Pulse: 90  Temp: 98.1 F (36.7 C)  Resp: 16   Filed Vitals:   06/10/14 1307  Height: 5' 5.5" (1.664 m)  Weight: 192 lb 3.2 oz (87.181 kg)   Body mass index is 31.49 kg/(m^2). Ideal Body Weight: Weight in (lb) to have BMI = 25: 152.2  GEN: WDWN, NAD, Non-toxic, A & O x 3, overweight, looks well HEENT: Atraumatic, Normocephalic. Neck supple. No masses, No LAD.  Bilateral TM wnl, oropharynx normal.  PEERL,EOMI.   Ears and Nose: No external deformity. CV: RRR, No M/G/R. No JVD. No thrill. No extra heart sounds. PULM: CTA B, no wheezes, crackles, rhonchi. No retractions. No resp. distress. No accessory muscle use. ABD: S, NT, ND. No rebound. No HSM. EXTR: No c/c/e NEURO Normal gait.  PSYCH: Normally interactive. Conversant. Not depressed or anxious appearing.  Calm demeanor.  She has MSK tenderness in the left anterior ribs just under her breast.   Left knee: she is mildly tender over the anterior knee.  There is crepitus. No heat, swelling, or bruise.  Normal ROM, knee is stable  [facility] reading (PRIMARY) by  Dr. Patsy Lager. CXR:  Stable mild cardiomegaly. Otherwise  negative  CHEST 2 VIEW  COMPARISON: Chest x-ray of 03/26/2014  FINDINGS: No infiltrate or effusion is seen. Mild peribronchial thickening is noted which may indicate bronchitis. The heart is within upper limits normal and stable. No bony abnormality is seen.  IMPRESSION: No pneumonia. Mild peribronchial thickening may indicate bronchitis.   Results for orders placed in visit on 06/10/14  POCT CBC      Result Value Ref Range   WBC 6.9  4.6 - 10.2 K/uL   Lymph, poc 2.8  0.6 - 3.4   POC LYMPH PERCENT 40.9  10 - 50 %L   MID (cbc) 0.5  0 - 0.9   POC MID % 7.5  0 - 12 %M   POC Granulocyte 3.6  2 - 6.9   Granulocyte percent 51.6  37 - 80 %G   RBC 4.61  4.04 - 5.48 M/uL   Hemoglobin 12.8  12.2 - 16.2 g/dL   HCT, POC 44.0  10.2 - 47.9 %   MCV 86.8  80 - 97 fL   MCH, POC 27.8  27 - 31.2 pg   MCHC 32.0  31.8 - 35.4 g/dL   RDW, POC 72.5     Platelet Count, POC 274  142 - 424 K/uL   MPV 6.5  0 - 99.8 fL    Assessment and Plan: Cough - Plan: DG Chest 2 View, DG Chest 2 View, benzonatate (TESSALON) 100 MG capsule  Other malaise and fatigue - Plan: POCT CBC  Knee contusion, left, initial encounter  Persistent cough.  Reassured that her CXR does not know pneumonia, and that it is typical for a cough to last for  several weeks.  At this time she will continue her augmentin and gave tessalon perles for cough Offered x-ray of her knee, but she declines as it is not hurting her much She feels fatigued. She is taking oxycodone 40 q 4 hours.  CBC normal as above.  Suspect fatigue is due to current illness and medicatoins   Signed Abbe Amsterdam, MD

## 2014-06-10 NOTE — Patient Instructions (Signed)
Continue your antibiotic and use the tessalon perles as needed for cough. Let me know if you are not feeling better soon

## 2014-06-22 ENCOUNTER — Ambulatory Visit: Payer: 59 | Admitting: Nurse Practitioner

## 2014-06-23 ENCOUNTER — Encounter: Payer: Self-pay | Admitting: Physician Assistant

## 2014-06-23 DIAGNOSIS — G894 Chronic pain syndrome: Secondary | ICD-10-CM

## 2014-06-24 ENCOUNTER — Ambulatory Visit (INDEPENDENT_AMBULATORY_CARE_PROVIDER_SITE_OTHER): Payer: 59 | Admitting: Physician Assistant

## 2014-06-24 VITALS — BP 130/80 | HR 81 | Temp 98.8°F | Resp 16 | Ht 66.0 in | Wt 185.0 lb

## 2014-06-24 DIAGNOSIS — F419 Anxiety disorder, unspecified: Secondary | ICD-10-CM

## 2014-06-24 DIAGNOSIS — G894 Chronic pain syndrome: Secondary | ICD-10-CM

## 2014-06-24 DIAGNOSIS — G4700 Insomnia, unspecified: Secondary | ICD-10-CM

## 2014-06-24 DIAGNOSIS — F411 Generalized anxiety disorder: Secondary | ICD-10-CM

## 2014-06-24 MED ORDER — OXYCODONE HCL 20 MG PO TABS: 40.0000 mg | ORAL_TABLET | ORAL | Status: DC

## 2014-06-24 MED ORDER — ALPRAZOLAM 1 MG PO TABS: ORAL_TABLET | ORAL | Status: DC

## 2014-06-24 MED ORDER — CYCLOBENZAPRINE HCL 10 MG PO TABS: 10.0000 mg | ORAL_TABLET | Freq: Three times a day (TID) | ORAL | Status: DC | PRN

## 2014-06-24 NOTE — Telephone Encounter (Signed)
Patient notified via My Chart.  Meds ordered this encounter  Medications  . Oxycodone HCl 20 MG TABS    Sig: Take 2 tablets (40 mg total) by mouth every 4 (four) hours.    Dispense:  168 tablet    Refill:  0    The patient has #21 30 mg tabs and #21 15 mg tabs remaining, that she will reserve and use when she drops the dose down to 35 mg.    Order Specific Question:  Supervising Provider    Answer:  DOOLITTLE, ROBERT P [[id number]]    

## 2014-06-24 NOTE — Progress Notes (Signed)
Subjective:    Patient ID: [name], female    DOB: Feb 18, 1971, 43 y.o.   MRN: [id number]   PCP: Analese Sovine,Jalecia Leon, PA-C  Chief Complaint  Patient presents with  . Medication Refill    xanax, oxycodone    Medications, allergies, past medical history, surgical history, family history, social history and problem list reviewed and updated.  Prior to Admission medications   Medication Sig Start Date End Date Taking? Authorizing Provider  acetaminophen (TYLENOL) 500 MG tablet Take 1,000 mg by mouth every 6 (six) hours as needed for moderate pain.   Yes Historical Provider, MD  ALPRAZolam Prudy Feeler) 1 MG tablet Take 1 tablet as needed Morning and mid-day; take 1-3 tablets at bedtime as needed. 06/03/14  Yes Alzena Gerber S Jakyria Bleau, PA-C  amLODipine (NORVASC) 10 MG tablet Take 10 mg by mouth at bedtime.   Yes Historical Provider, MD  amphetamine-dextroamphetamine (ADDERALL) 30 MG tablet Take 0.5 tablets (15 mg total) by mouth 3 (three) times daily. 05/01/14  Yes Joelle Roswell S Temesgen Weightman, PA-C  busPIRone (BUSPAR) 15 MG tablet Take 1 tablet (15 mg total) by mouth 2 (two) times daily. 06/03/14  Yes Masaichi Kracht S Sae Handrich, PA-C  EPINEPHrine (EPIPEN) 0.3 mg/0.3 mL SOAJ injection Inject 0.3 mg into the muscle once as needed (Anaphylaxis).    Yes Historical Provider, MD  gabapentin (NEURONTIN) 300 MG capsule Take 1 capsule (300 mg total) by mouth 3 (three) times daily. 06/03/14  Yes Shelene Krage S Iwalani Templeton, PA-C  ibuprofen (ADVIL,MOTRIN) 200 MG tablet Take 600 mg by mouth 2 (two) times daily as needed for pain.   Yes Historical Provider, MD  Linaclotide Karlene Einstein) 145 MCG CAPS capsule Take 1 capsule (145 mcg total) by mouth daily. 06/03/14  Yes Anyae Griffith S Kourtnie Sachs, PA-C  methocarbamol (ROBAXIN) 750 MG tablet TAKE 1-2 BY MOUTH EVERY 6 HOURS AS NEEDED FOR MUSCLE SPASMS   Yes Melo Stauber S Briar Witherspoon, PA-C  ondansetron (ZOFRAN) 4 MG tablet Take 1 tablet (4 mg total) by mouth every 6 (six) hours. 08/31/13  Yes Trevor Mace, PA-C  Oxycodone HCl 20 MG  TABS Take 2 tablets (40 mg total) by mouth every 4 (four) hours. 06/24/14  Yes Kesean Serviss S Pieter Fooks, PA-C  pantoprazole (PROTONIX) 40 MG tablet Take 1 tablet (40 mg total) by mouth at bedtime. 06/03/14  Yes Farryn Linares S Fitzhugh Vizcarrondo, PA-C  promethazine (PHENERGAN) 25 MG tablet Take 1 tablet (25 mg total) by mouth every 6 (six) hours as needed for nausea. 06/03/14  Yes Markavious Micco S , PA-C  sertraline (ZOLOFT) 100 MG tablet Take 1.5 tablets (150 mg total) by mouth at bedtime. 01/05/14  Yes  Tessa Lerner, PA-C    HPI  Doing well.  No new problems, concerns. Needs medication refills today. Doesn't feel ready to reduce the oxycodone dose yet.  Mood has also been more stable, with better controlled anxiety.   Review of Systems     Objective:   Physical Exam  Constitutional: She is oriented to person, place, and time. She appears well-developed and well-nourished.  BP 130/80  Pulse 81  Temp(Src) 98.8 F (37.1 C) (Oral)  Resp 16  Ht 5\' 6"  (1.676 m)  Wt 185 lb (83.915 kg)  BMI 29.87 kg/m2  SpO2 98%   Eyes: Conjunctivae are normal. No scleral icterus.  Neck: No thyromegaly present.  Cardiovascular: Normal rate, regular rhythm and normal heart sounds.   Pulmonary/Chest: Effort normal and breath sounds normal.  Lymphadenopathy:    She has no cervical adenopathy.  Neurological: She is alert and  oriented to person, place, and time.  Skin: Skin is warm and dry.  Psychiatric: She has a normal mood and affect. Her speech is normal and behavior is normal. Thought content normal.          Assessment & Plan:  1. Chronic pain disorder Robaxin wasn't helpful. Change back to Flexeril. Continue current dose of Oxycodone. - cyclobenzaprine (FLEXERIL) 10 MG tablet; Take 1 tablet (10 mg total) by mouth 3 (three) times daily as needed for muscle spasms.  Dispense: 30 tablet; Refill: 0 - Oxycodone HCl 20 MG TABS; Take 2 tablets (40 mg total) by mouth every 4 (four) hours. Dispense: 168 tablet; Refill: 0 -  Oxycodone HCl 20 MG TABS; Take 2 tablets (40 mg total) by mouth every 4 (four) hours. May fill 14 days after date on prescription  Dispense: 168 tablet; Refill: 0  2. Anxiety Stable. Continue Zoloft.  Consider addition of Buspar if anxiety increases. - ALPRAZolam (XANAX) 1 MG tablet; Take 1 tablet as needed Morning and mid-day; take 1-3 tablets at bedtime as needed.  Dispense: 105 tablet; Refill: 0  3. Insomnia Due to anxiety, stable. - ALPRAZolam (XANAX) 1 MG tablet; Take 1 tablet as needed Morning and mid-day; take 1-3 tablets at bedtime as needed.  Dispense: 105 tablet; Refill: 0  Return in about 1 month (around 07/25/2014), or if symptoms worsen or fail to improve.  [name], PA-C Physician Assistant-Certified [facility]

## 2014-06-30 ENCOUNTER — Emergency Department (HOSPITAL_COMMUNITY): Payer: 59

## 2014-06-30 ENCOUNTER — Emergency Department (HOSPITAL_COMMUNITY)
Admission: EM | Admit: 2014-06-30 | Discharge: 2014-06-30 | Disposition: A | Discharge status: Home / Self Care | Payer: 59 | Attending: Emergency Medicine | Admitting: Emergency Medicine

## 2014-06-30 ENCOUNTER — Encounter (HOSPITAL_COMMUNITY): Payer: Self-pay | Admitting: Emergency Medicine

## 2014-06-30 DIAGNOSIS — F329 Major depressive disorder, single episode, unspecified: Secondary | ICD-10-CM | POA: Insufficient documentation

## 2014-06-30 DIAGNOSIS — I10 Essential (primary) hypertension: Secondary | ICD-10-CM | POA: Insufficient documentation

## 2014-06-30 DIAGNOSIS — G478 Other sleep disorders: Secondary | ICD-10-CM | POA: Insufficient documentation

## 2014-06-30 DIAGNOSIS — D649 Anemia, unspecified: Secondary | ICD-10-CM | POA: Insufficient documentation

## 2014-06-30 DIAGNOSIS — Z79899 Other long term (current) drug therapy: Secondary | ICD-10-CM | POA: Diagnosis not present

## 2014-06-30 DIAGNOSIS — K219 Gastro-esophageal reflux disease without esophagitis: Secondary | ICD-10-CM | POA: Diagnosis not present

## 2014-06-30 DIAGNOSIS — M129 Arthropathy, unspecified: Secondary | ICD-10-CM | POA: Insufficient documentation

## 2014-06-30 DIAGNOSIS — F3289 Other specified depressive episodes: Secondary | ICD-10-CM | POA: Insufficient documentation

## 2014-06-30 DIAGNOSIS — K122 Cellulitis and abscess of mouth: Secondary | ICD-10-CM

## 2014-06-30 DIAGNOSIS — N9489 Other specified conditions associated with female genital organs and menstrual cycle: Secondary | ICD-10-CM | POA: Diagnosis present

## 2014-06-30 DIAGNOSIS — G43909 Migraine, unspecified, not intractable, without status migrainosus: Secondary | ICD-10-CM | POA: Diagnosis not present

## 2014-06-30 DIAGNOSIS — Z87442 Personal history of urinary calculi: Secondary | ICD-10-CM | POA: Diagnosis not present

## 2014-06-30 DIAGNOSIS — F411 Generalized anxiety disorder: Secondary | ICD-10-CM | POA: Insufficient documentation

## 2014-06-30 DIAGNOSIS — Z87891 Personal history of nicotine dependence: Secondary | ICD-10-CM | POA: Insufficient documentation

## 2014-06-30 DIAGNOSIS — Z8619 Personal history of other infectious and parasitic diseases: Secondary | ICD-10-CM | POA: Diagnosis not present

## 2014-06-30 DIAGNOSIS — Z9981 Dependence on supplemental oxygen: Secondary | ICD-10-CM | POA: Diagnosis not present

## 2014-06-30 LAB — BASIC METABOLIC PANEL
Anion gap: 13 (ref 5–15)
BUN: 7 mg/dL (ref 6–23)
CALCIUM: 9.5 mg/dL (ref 8.4–10.5)
CHLORIDE: 104 meq/L (ref 96–112)
CO2: 26 mEq/L (ref 19–32)
CREATININE: 0.6 mg/dL (ref 0.50–1.10)
GFR calc Af Amer: >90 mL/min (ref >90)
GFR calc non Af Amer: >90 mL/min (ref >90)
GLUCOSE: 111 mg/dL — AB (ref 70–99)
Potassium: 4.3 mEq/L (ref 3.7–5.3)
Sodium: 143 mEq/L (ref 137–147)

## 2014-06-30 LAB — CBC WITH DIFFERENTIAL/PLATELET
Basophils Absolute: 0.1 10*3/uL (ref 0.0–0.1)
Basophils Relative: 1 % (ref 0–1)
EOS PCT: 4 % (ref 0–5)
Eosinophils Absolute: 0.2 10*3/uL (ref 0.0–0.7)
HEMATOCRIT: 36.3 % (ref 36.0–46.0)
HEMOGLOBIN: 11.9 g/dL — AB (ref 12.0–15.0)
Lymphocytes Relative: 45 % (ref 12–46)
Lymphs Abs: 2.7 10*3/uL (ref 0.7–4.0)
MCH: 28.3 pg (ref 26.0–34.0)
MCHC: 32.8 g/dL (ref 30.0–36.0)
MCV: 86.2 fL (ref 78.0–100.0)
MONO ABS: 0.4 10*3/uL (ref 0.1–1.0)
Monocytes Relative: 7 % (ref 3–12)
Neutro Abs: 2.5 10*3/uL (ref 1.7–7.7)
Neutrophils Relative %: 43 % (ref 43–77)
Platelets: 269 10*3/uL (ref 150–400)
RBC: 4.21 MIL/uL (ref 3.87–5.11)
RDW: 13 % (ref 11.5–15.5)
WBC: 5.9 10*3/uL (ref 4.0–10.5)

## 2014-06-30 LAB — RAPID STREP SCREEN (MED CTR MEBANE ONLY): Streptococcus, Group A Screen (Direct): NEGATIVE

## 2014-06-30 MED ORDER — DEXAMETHASONE SODIUM PHOSPHATE 10 MG/ML IJ SOLN
10.0000 mg | Freq: Once | INTRAMUSCULAR | Status: AC
  Administered 2014-06-30: 10 mg via INTRAVENOUS
  Filled 2014-06-30: qty 1

## 2014-06-30 MED ORDER — DIPHENHYDRAMINE HCL 50 MG/ML IJ SOLN
25.0000 mg | Freq: Once | INTRAMUSCULAR | Status: AC
  Administered 2014-06-30: 25 mg via INTRAVENOUS
  Filled 2014-06-30: qty 1

## 2014-06-30 MED ORDER — SODIUM CHLORIDE 0.9 % IV BOLUS (SEPSIS)
1000.0000 mL | Freq: Once | INTRAVENOUS | Status: AC
  Administered 2014-06-30: 1000 mL via INTRAVENOUS

## 2014-06-30 NOTE — ED Provider Notes (Signed)
CSN: [id number]     Arrival date & time 06/30/14  1323 History   First MD Initiated Contact with Patient 06/30/14 1502     Chief Complaint  Patient presents with  . Allergic Reaction   Patient is a 43 y.o. female presenting with allergic reaction.  Allergic Reaction   Patient is a 43 y.o. Female who presents to the ED with swelling to the uvula which started today at 10:00 am.  Patient states that nothing out of the ordinary happened.  Patient states that this has happened to her in the past.  She states that she thinks that this is due to an allergic reaction.  In the past she has been treated with epi, steroids, and benadryl.  Patient states that she has had no changes in medications or food or beverage intake.  She denies fever, chills, rash, shortness of breath, facial flushing, sore throat, chest pain, or cough.  Patient states that she has never been evaluated by an ENT or an allergist for this recurrent problem.  All other ROS are negative at this time.    Past Medical History  Diagnosis Date  . Anxiety   . GERD (gastroesophageal reflux disease)   . History of alcohol abuse     no alcohol since 2004  . Migraine headache   . History of cocaine abuse     none since 2011, entered treatment  . History of kidney stones   . Arthritis     left knee  . Depression   . Complication of anesthesia 03/2011    history of aspiration after anesthesia  . History of esophageal dilatation     multiple  . History of pancreatitis 2012  . Hypertension     under control with med., has been on med. x 3 yr.  . Sleep apnea     only uses CPAP machine 3 x/week  . Medial meniscus tear 07/2013    right  . Cervical muscle strain 07/2013    current Prednisone taper  . Dental crowns present     x 2 upper front  . Genital herpes simplex type 2   . Herniated disc, cervical    Past Surgical History  Procedure Laterality Date  . Abdominal hysterectomy      partial  . Cesarean section      x 2  .  Nasal septum surgery    . Carpal tunnel release  07/29/2012    Procedure: CARPAL TUNNEL RELEASE;  Surgeon: Eldred Manges, MD;  Location:  SURGERY CENTER;  Service: Orthopedics;  Laterality: Right;  Right carpal tunnel release  . Laparoscopic appendectomy  02/21/2006  . Laparoscopic lysis of adhesions  02/21/2006  . Cholecystectomy  04/05/2010  . Ercp w/ metal stent placement  03/14/2011  . Cystoscopy w/ ureteral stent placement Right 06/11/2007  . Nasal septoplasty w/ turbinoplasty Bilateral 07/26/2009    bilat. turb. reduction  . Knee arthroscopy Left     x 3  . Knee arthroscopy with medial menisectomy Right 07/31/2013    Procedure: KNEE ARTHROSCOPY WITH PARTIAL LATERAL MENISECTOMY, CHONDROPLASTY;  Surgeon: Loreta Ave, MD;  Location:  SURGERY CENTER;  Service: Orthopedics;  Laterality: Right;   Family History  Problem Relation Age of Onset  . Heart disease Maternal Uncle   . Heart disease Maternal Grandmother   . Kidney cancer Mother   . Hypertension Mother   . Cancer Mother     renal  . Lung cancer Maternal Aunt   .  Stroke Maternal Uncle 55  . Alcohol abuse Maternal Uncle    History  Substance Use Topics  . Smoking status: Former Smoker    Quit date: 06/05/2013  . Smokeless tobacco: Never Used  . Alcohol Use: No     Comment: none since 2004   OB History   Grav Para Term Preterm Abortions TAB SAB Ect Mult Living                 Review of Systems See HPI   Allergies  Imitrex; Trazodone and nefazodone; Wellbutrin; Effexor; Metoclopramide hcl; Morphine and related; Tegretol; and Topamax  Home Medications   Prior to Admission medications   Medication Sig Start Date End Date Taking? Authorizing Provider  acetaminophen (TYLENOL) 500 MG tablet Take 1,000 mg by mouth every 6 (six) hours as needed for moderate pain.   Yes Historical Provider, MD  ALPRAZolam Prudy Feeler) 1 MG tablet Take 1 tablet as needed Morning and mid-day; take 1-3 tablets at bedtime as needed.  06/24/14  Yes [name] S Jeffery, PA-C  amLODipine (NORVASC) 10 MG tablet Take 10 mg by mouth at bedtime.   Yes Historical Provider, MD  amphetamine-dextroamphetamine (ADDERALL) 30 MG tablet Take 0.5 tablets (15 mg total) by mouth 3 (three) times daily. 05/01/14  Yes [name] S Jeffery, PA-C  cyclobenzaprine (FLEXERIL) 10 MG tablet Take 1 tablet (10 mg total) by mouth 3 (three) times daily as needed for muscle spasms. 06/24/14  Yes [name] S Jeffery, PA-C  EPINEPHrine (EPIPEN) 0.3 mg/0.3 mL SOAJ injection Inject 0.3 mg into the muscle once as needed (Anaphylaxis).    Yes Historical Provider, MD  gabapentin (NEURONTIN) 300 MG capsule Take 1 capsule (300 mg total) by mouth 3 (three) times daily. 06/03/14  Yes [name] S Jeffery, PA-C  ibuprofen (ADVIL,MOTRIN) 200 MG tablet Take 600 mg by mouth 2 (two) times daily as needed for pain.   Yes Historical Provider, MD  Linaclotide Karlene Einstein) 145 MCG CAPS capsule Take 1 capsule (145 mcg total) by mouth daily. 06/03/14  Yes [name] S Jeffery, PA-C  ondansetron (ZOFRAN) 4 MG tablet Take 1 tablet (4 mg total) by mouth every 6 (six) hours. 08/31/13  Yes Trevor Mace, PA-C  Oxycodone HCl 20 MG TABS Take 2 tablets (40 mg total) by mouth every 4 (four) hours. 06/24/14  Yes [name] S Jeffery, PA-C  pantoprazole (PROTONIX) 40 MG tablet Take 1 tablet (40 mg total) by mouth at bedtime. 06/03/14  Yes [name] S Jeffery, PA-C  promethazine (PHENERGAN) 25 MG tablet Take 1 tablet (25 mg total) by mouth every 6 (six) hours as needed for nausea. 06/03/14  Yes [name] S Jeffery, PA-C  sertraline (ZOLOFT) 100 MG tablet Take 1.5 tablets (150 mg total) by mouth at bedtime. 01/05/14  Yes [name] S Jeffery, PA-C   BP 112/87  Pulse 100  Temp(Src) 98.6 F (37 C) (Oral)  Resp 18  SpO2 98% Physical Exam  Nursing note and vitals reviewed. Constitutional: She is oriented to person, place, and time. She appears well-developed and well-nourished. No distress.  HENT:  Head: Normocephalic and atraumatic.   Mouth/Throat: Uvula is midline, oropharynx is clear and moist and mucous membranes are normal. No trismus in the jaw. Uvula swelling present. No lacerations. No oropharyngeal exudate, posterior oropharyngeal edema, posterior oropharyngeal erythema or tonsillar abscesses.  Eyes: Conjunctivae and EOM are normal. Pupils are equal, round, and reactive to light. No scleral icterus.  Neck: Normal range of motion. Neck supple. No JVD present. No thyromegaly present.  Cardiovascular: Normal  rate, regular rhythm, normal heart sounds and intact distal pulses.  Exam reveals no gallop and no friction rub.   No murmur heard. Pulmonary/Chest: Effort normal and breath sounds normal. No respiratory distress. She has no wheezes. She has no rales. She exhibits no tenderness.  Abdominal: Soft. Bowel sounds are normal. She exhibits no distension and no mass. There is no tenderness. There is no rebound and no guarding.  Musculoskeletal: Normal range of motion.  Lymphadenopathy:    She has no cervical adenopathy.  Neurological: She is alert and oriented to person, place, and time. She has normal strength. No cranial nerve deficit or sensory deficit. Coordination normal.  Skin: Skin is warm and dry. She is not diaphoretic.  Psychiatric: She has a normal mood and affect. Her behavior is normal. Judgment and thought content normal.    ED Course  Procedures (including critical care time) Labs Review Labs Reviewed  CBC WITH DIFFERENTIAL - Abnormal; Notable for the following:    Hemoglobin 11.9 (*)    All other components within normal limits  BASIC METABOLIC PANEL - Abnormal; Notable for the following:    Glucose, Bld 111 (*)    All other components within normal limits  RAPID STREP SCREEN    Imaging Review Dg Neck Soft Tissue  06/30/2014   CLINICAL DATA:  Allergic reaction began 1.5 hr ago.  Unknown cause.  EXAM: NECK SOFT TISSUES - 1+ VIEW  COMPARISON:  MRI 07/22/2013  FINDINGS: Nasopharynx is patent. The  pharynx and subglottic airway are within normal. Epiglottis is within normal. Prevertebral soft tissues are normal. There is mild spondylosis of the spine.  IMPRESSION: Patent airway.  Mild spondylosis of the cervical spine.   Electronically Signed   By: Elberta Fortis M.D.   On: 06/30/2014 16:27     EKG Interpretation None      MDM   Final diagnoses:  Uvulitis   Patient is a 43 y.o. Female who presents to the ED with swelling of her uvula.  Physical exam reveals mildly swollen uvula with obviously patent airway.  There are no signs of infection at this time. CBC reveals only mild anemia.  BMP is unremarkable.  Rapid strep is pending.  Plain film of soft tissues of the neck reveal patent airway with no evidence of epiglotitis.  Vital signs are stable here at this time and patient is able to tolerate PO fluids after being observed for over an hour with no airway difficulties.  Patient is stable for discharge at this time.  Patient is to follow-up with ENT at this time for further evaluation.  Patient was seen and discussed with Dr. Manus Gunning who agrees with the above plan.  Patient was told to return for fever, epiglotitis symptoms, trismus, ludwigs angina symptoms, or worsening shortness of breath.  She states understanding and agreement.       Eben Burow, PA-C 06/30/14 1756

## 2014-06-30 NOTE — ED Notes (Signed)
Per EMS: Pt has swelling to back of throat.  Started an hour and a half ago.  Unknown cause.  Airway patent, no distress.  No hives or itching.

## 2014-06-30 NOTE — Discharge Instructions (Signed)
Uvulitis  Uvulitis is redness and soreness (inflammation) of the uvula. The uvula is the small tongue-shaped piece of tissue in the back of your mouth.   CAUSES  Infection is a common cause of uvulitis. Infection of the uvula can be either viral or bacterial. Infectious uvulitis usually only occurs in association with another condition, such as inflammation and infection of the mouth or throat.   Other causes of uvulitis include:  · Trauma to the uvula.  · Swelling from excess fluid buildup (edema), which may be an allergic reaction.  · Inhalation of irritants, such as chemical agents, smoke, or steam.  DIAGNOSIS  Your caregiver can usually diagnose uvulitis through a physical examination. Bacterial uvulitis can be diagnosed through the results of the growth of samples of bodily substances taken from your mouth (cultures).  HOME CARE INSTRUCTIONS   · Rest as much as possible.  · Young children may suck on frozen juice bars or frozen ice pops. Older children and adults may gargle with a warm or cold liquid to help soothe the throat. (Mix ¼ tsp of salt in 8 oz of water, or use strong tea.)  · Use a cool-mist humidifier to lessen throat irritation and cough.  · Drink enough fluids to keep your urine clear or pale yellow.  · While the throat is very sore, eat soft or liquid foods such as milk, ice cream, soups, or milk drinks.  · Family members who develop a sore throat or fever should have a medical exam or throat culture.  · If your child has uvulitis and is taking antibiotic medicine, wait 24 hours or until his or her temperature is near normal (less than 100° F [37.8° C]) before allowing him or her to return to school or day care.  · Only take over-the-counter or prescription medicines for pain, discomfort, or fever as directed by your caregiver.  Ask when your test results will be ready. Make sure you get your test results.   SEEK MEDICAL CARE IF:   · You have an oral temperature above 102° F (38.9° C).  · You  develop large, tender lumps your the neck.  · Your child develops a rash.  · You cough up green, yellow-brown, or bloody substances.  SEEK IMMEDIATE MEDICAL CARE IF:   · You develop any new symptoms, such as vomiting, earache, severe headache, stiff neck, chest pain, or trouble breathing or swallowing.  · Your airway is blocked.  · You develop more severe throat pain along with drooling or voice changes.  Document Released: 06/02/2004 Document Revised: 01/15/2012 Document Reviewed: 12/29/2010  ExitCare® Patient Information ©2015 [employer]. This information is not intended to replace advice given to you by your health care provider. Make sure you discuss any questions you have with your health care provider.

## 2014-07-01 ENCOUNTER — Encounter: Payer: Self-pay | Admitting: Gastroenterology

## 2014-07-01 ENCOUNTER — Ambulatory Visit (INDEPENDENT_AMBULATORY_CARE_PROVIDER_SITE_OTHER): Payer: 59 | Admitting: Gastroenterology

## 2014-07-01 ENCOUNTER — Encounter: Payer: Self-pay | Admitting: Physician Assistant

## 2014-07-01 VITALS — BP 110/74 | HR 68 | Ht 66.0 in | Wt 198.6 lb

## 2014-07-01 DIAGNOSIS — F3289 Other specified depressive episodes: Secondary | ICD-10-CM

## 2014-07-01 DIAGNOSIS — K5900 Constipation, unspecified: Secondary | ICD-10-CM

## 2014-07-01 DIAGNOSIS — R1031 Right lower quadrant pain: Secondary | ICD-10-CM

## 2014-07-01 DIAGNOSIS — K222 Esophageal obstruction: Secondary | ICD-10-CM

## 2014-07-01 DIAGNOSIS — F329 Major depressive disorder, single episode, unspecified: Secondary | ICD-10-CM

## 2014-07-01 DIAGNOSIS — F32A Depression, unspecified: Secondary | ICD-10-CM

## 2014-07-01 MED ORDER — LUBIPROSTONE 24 MCG PO CAPS: 24.0000 ug | ORAL_CAPSULE | Freq: Two times a day (BID) | ORAL | Status: DC

## 2014-07-01 MED ORDER — EPINEPHRINE 0.3 MG/0.3ML IJ SOAJ: 0.3000 mg | Freq: Once | INTRAMUSCULAR | Status: DC | PRN

## 2014-07-01 MED ORDER — HYOSCYAMINE SULFATE ER 0.375 MG PO TBCR: EXTENDED_RELEASE_TABLET | ORAL | Status: DC

## 2014-07-01 MED ORDER — PREDNISONE 50 MG PO TABS
ORAL_TABLET | ORAL | Status: DC
Start: 2014-07-01 — End: 2014-07-01

## 2014-07-01 MED ORDER — DIPHENHYDRAMINE HCL 25 MG PO TABS: 25.0000 mg | ORAL_TABLET | Freq: Four times a day (QID) | ORAL | Status: DC

## 2014-07-01 MED ORDER — FAMOTIDINE 20 MG PO TABS: 20.0000 mg | ORAL_TABLET | Freq: Two times a day (BID) | ORAL | Status: DC

## 2014-07-01 NOTE — ED Provider Notes (Signed)
Medical screening examination/treatment/procedure(s) were conducted as a shared visit with non-physician practitioner(s) and myself.  I personally evaluated the patient during the encounter.  1 hour of uvula swelling.  NO new exposures.  No SOB, chest pain, wheezing, difficulty breathing or swallowing.  Enlarge uvula without asymmetry. No tongue or lip swelling.  Hx similar episodes in past without diagnosis.  Airway intact. Controlling secretions.   EKG Interpretation None       Glynn Octave, MD 07/01/14 [phone]

## 2014-07-01 NOTE — Assessment & Plan Note (Signed)
Pain is nonspecific and probably related to IBS.  Reason CT scan was negative.  Recommendations #1 trial of hyomax when necessary

## 2014-07-01 NOTE — Telephone Encounter (Signed)
Patient notified via My Chart.  Meds ordered this encounter  Medications  . EPINEPHrine (EPIPEN) 0.3 mg/0.3 mL IJ SOAJ injection    Sig: Inject 0.3 mLs (0.3 mg total) into the muscle once as needed (Anaphylaxis).    Dispense:  1 Device    Refill:  2    Order Specific Question:  Supervising Provider    Answer:  DOOLITTLE, ROBERT P [[id number]]

## 2014-07-01 NOTE — Assessment & Plan Note (Signed)
Patient is symptomatic again complaining of dysphagia.  Recommendations #1 upper endoscopy with dilation as indicated

## 2014-07-01 NOTE — Addendum Note (Signed)
Addended by: Marlowe Kays on: 07/01/2014 02:07 PM   Modules accepted: Orders

## 2014-07-01 NOTE — Patient Instructions (Signed)

## 2014-07-01 NOTE — Assessment & Plan Note (Signed)
Patient suffers from chronic constipation that is likely exacerbated by narcotic use.  Linzess causes diarrhea.  Recommendations #1 trial of an adhesive 24 mcg twice a day

## 2014-07-01 NOTE — Progress Notes (Signed)
_                                                                                                                History of Present Illness:  [name] has returned for evaluation of dysphagia and lower abdominal pain.  She has a history of an esophageal stricture and was last dilated in 2008.  She takes Linzess for chronic constipation.  Unfortunately, both the higher and lower doses cause diarrhea.  In the last few weeks she has been complaining of sharp right lower caution pain.  Pain was initially constant and is now intermittent.  It is unaffected by eating or bowel movements.  It is not positional.  She denies change of bowel habits or rectal bleeding.  She states that she initially lost 15 pounds in 2 weeks but has regained the weight.  She complains of intermittent pyrosis.  She's taking Protonix.  CT scan in May, 2015 did not demonstrate any acute abnormalities.   Past Medical History  Diagnosis Date  . Anxiety   . GERD (gastroesophageal reflux disease)   . History of alcohol abuse     no alcohol since 2004  . Migraine headache   . History of cocaine abuse     none since 2011, entered treatment  . History of kidney stones   . Arthritis     left knee  . Depression   . Complication of anesthesia 03/2011    history of aspiration after anesthesia  . History of esophageal dilatation     multiple  . History of pancreatitis 2012  . Hypertension     under control with med., has been on med. x 3 yr.  . Sleep apnea     only uses CPAP machine 3 x/week  . Medial meniscus tear 07/2013    right  . Cervical muscle strain 07/2013    current Prednisone taper  . Dental crowns present     x 2 upper front  . Genital herpes simplex type 2   . Herniated disc, cervical    Past Surgical History  Procedure Laterality Date  . Abdominal hysterectomy      partial  . Cesarean section      x 2  . Nasal septum surgery    . Carpal tunnel release  07/29/2012    Procedure: CARPAL  TUNNEL RELEASE;  Surgeon: Eldred Manges, MD;  Location: [facility];  Service: Orthopedics;  Laterality: Right;  Right carpal tunnel release  . Laparoscopic appendectomy  02/21/2006  . Laparoscopic lysis of adhesions  02/21/2006  . Cholecystectomy  04/05/2010  . Ercp w/ metal stent placement  03/14/2011  . Cystoscopy w/ ureteral stent placement Right 06/11/2007  . Nasal septoplasty w/ turbinoplasty Bilateral 07/26/2009    bilat. turb. reduction  . Knee arthroscopy Left     x 3  . Knee arthroscopy with medial menisectomy Right 07/31/2013    Procedure: KNEE ARTHROSCOPY WITH PARTIAL LATERAL MENISECTOMY, CHONDROPLASTY;  Surgeon: Loreta Ave, MD;  Location: [facility];  Service: Orthopedics;  Laterality: Right;   family history includes Alcohol abuse in [name] maternal uncle; Cancer in [name] mother; Heart disease in [name] maternal grandmother and maternal uncle; Hypertension in [name] mother; Kidney cancer in [name] mother; Lung cancer in [name] maternal aunt; Stroke (age of onset: 48) in [name] maternal uncle. Current Outpatient Prescriptions  Medication Sig Dispense Refill  . acetaminophen (TYLENOL) 500 MG tablet Take 1,000 mg by mouth every 6 (six) hours as needed for moderate pain.      Marland Kitchen ALPRAZolam (XANAX) 1 MG tablet Take 1 tablet as needed Morning and mid-day; take 1-3 tablets at bedtime as needed.  105 tablet  0  . amLODipine (NORVASC) 10 MG tablet Take 10 mg by mouth at bedtime.      Marland Kitchen amphetamine-dextroamphetamine (ADDERALL) 30 MG tablet Take 0.5 tablets (15 mg total) by mouth 3 (three) times daily.  45 tablet  0  . cyclobenzaprine (FLEXERIL) 10 MG tablet Take 1 tablet (10 mg total) by mouth 3 (three) times daily as needed for muscle spasms.  30 tablet  0  . EPINEPHrine (EPIPEN) 0.3 mg/0.3 mL SOAJ injection Inject 0.3 mg into the muscle once as needed (Anaphylaxis).       Marland Kitchen gabapentin (NEURONTIN) 300 MG capsule Take 1 capsule (300 mg total) by mouth 3 (three) times daily.  90 capsule   0  . ibuprofen (ADVIL,MOTRIN) 200 MG tablet Take 600 mg by mouth 2 (two) times daily as needed for pain.      . Linaclotide (LINZESS) 145 MCG CAPS capsule Take 1 capsule (145 mcg total) by mouth daily.  90 capsule  3  . ondansetron (ZOFRAN) 4 MG tablet Take 1 tablet (4 mg total) by mouth every 6 (six) hours.  4 tablet  0  . Oxycodone HCl 20 MG TABS Take 2 tablets (40 mg total) by mouth every 4 (four) hours.  168 tablet  0  . pantoprazole (PROTONIX) 40 MG tablet Take 1 tablet (40 mg total) by mouth at bedtime.  90 tablet  3  . promethazine (PHENERGAN) 25 MG tablet Take 1 tablet (25 mg total) by mouth every 6 (six) hours as needed for nausea.  30 tablet  1  . sertraline (ZOLOFT) 100 MG tablet Take 1.5 tablets (150 mg total) by mouth at bedtime.  45 tablet  5   No current facility-administered medications for this visit.   Allergies as of 07/01/2014 - Review Complete 07/01/2014  Allergen Reaction Noted  . Imitrex [sumatriptan base] Shortness Of Breath and Other (See Comments) 03/02/2011  . Trazodone and nefazodone Other (See Comments) 11/21/2012  . Wellbutrin [bupropion] Other (See Comments) 06/03/2014  . Effexor [venlafaxine] Other (See Comments) 06/18/2013  . Metoclopramide hcl Itching 02/20/2011  . Morphine and related Itching 03/02/2011  . Tegretol [carbamazepine] Itching 02/20/2011  . Topamax Itching 02/20/2011    reports that she quit smoking about 12 months ago. She has never used smokeless tobacco. She reports that she does not drink alcohol or use illicit drugs.   Review of Systems: Pertinent positive and negative review of systems were noted in the above HPI section. All other review of systems were otherwise negative.  Vital signs were reviewed in today's medical record Physical Exam: General: Well developed , well nourished, no acute distress Skin: anicteric Head: Normocephalic and atraumatic Eyes:  sclerae anicteric, EOMI Ears: Normal auditory acuity Mouth: No deformity or  lesions Neck: Supple, no masses or thyromegaly Lungs: Clear throughout to auscultation Heart:  Regular rate and rhythm; no murmurs, rubs or bruits Abdomen: Soft,  and non distended. No masses, hepatosplenomegaly or hernias noted. Normal Bowel sounds.  There is mild right lower quadrant tenderness to deep palpation that decreases with abdominal muscle wall flexion Rectal:deferred Musculoskeletal: Symmetrical with no gross deformities  Skin: No lesions on visible extremities Pulses:  Normal pulses noted Extremities: No clubbing, cyanosis, edema or deformities noted Neurological: Alert oriented x 4, grossly nonfocal Cervical Nodes:  No significant cervical adenopathy Inguinal Nodes: No significant inguinal adenopathy Psychological:  Alert and cooperative. Normal mood and affect  See Assessment and Plan under Problem List

## 2014-07-02 ENCOUNTER — Telehealth (HOSPITAL_COMMUNITY): Payer: Self-pay | Admitting: Internal Medicine

## 2014-07-02 LAB — CULTURE, GROUP A STREP

## 2014-07-02 NOTE — ED Provider Notes (Signed)
Attempted to call patient about medications at pharmacy.  There was no answer.    Eben Burow, PA-C 07/02/14 [id number]

## 2014-07-06 ENCOUNTER — Other Ambulatory Visit: Payer: Self-pay | Admitting: Physician Assistant

## 2014-07-10 ENCOUNTER — Other Ambulatory Visit: Payer: Self-pay | Admitting: Physician Assistant

## 2014-07-15 ENCOUNTER — Ambulatory Visit (AMBULATORY_SURGERY_CENTER): Payer: 59 | Admitting: Gastroenterology

## 2014-07-15 ENCOUNTER — Encounter: Payer: Self-pay | Admitting: Gastroenterology

## 2014-07-15 VITALS — BP 100/62 | HR 76 | Temp 99.4°F | Resp 30 | Ht 66.0 in | Wt 198.0 lb

## 2014-07-15 DIAGNOSIS — K222 Esophageal obstruction: Secondary | ICD-10-CM

## 2014-07-15 DIAGNOSIS — R1310 Dysphagia, unspecified: Secondary | ICD-10-CM

## 2014-07-15 MED ORDER — NALOXEGOL OXALATE 25 MG PO TABS: 1.0000 | ORAL_TABLET | Freq: Every day | ORAL | Status: DC

## 2014-07-15 MED ORDER — SODIUM CHLORIDE 0.9 % IV SOLN: 500.0000 mL | INTRAVENOUS | Status: DC

## 2014-07-15 NOTE — Progress Notes (Signed)
Report to PACU, RN, vss, BBS= Clear.  

## 2014-07-15 NOTE — Op Note (Signed)
[facility] [address] [place], [address]   ENDOSCOPY PROCEDURE REPORT  PATIENT: Denise, [name]  MR#: [id number] BIRTHDATE: 25-Feb-1971 , 43  yrs. old GENDER: Female ENDOSCOPIST: Louis Meckel, MD REFERRED BY: PROCEDURE DATE:  07/15/2014 PROCEDURE:  EGD, diagnostic and Maloney dilation of esophagus ASA CLASS:     Class II INDICATIONS:  Dysphagia. MEDICATIONS: MAC sedation, administered by CRNA and propofol (Diprivan)  IV TOPICAL ANESTHETIC:  DESCRIPTION OF PROCEDURE: After the risks benefits and alternatives of the procedure were thoroughly explained, informed consent was obtained.  The LB [id number] endoscope was introduced through the mouth and advanced to the third portion of the duodenum. Without limitations.  The instrument was slowly withdrawn as the mucosa was fully examined.      The upper, middle and distal third of the esophagus were carefully inspected and no abnormalities were noted.  The z-line was well seen at the GEJ.  The endoscope was pushed into the fundus which was normal including a retroflexed view.  The antrum, gastric body, first and second part of the duodenum were unremarkable. Retroflexed views revealed no abnormalities.     The scope was then withdrawn from the patient and the procedure completed.  because of complaints of dysphagia a #52 [place] Maloney dilator was passed with minimal resistance.  There was no heme.  COMPLICATIONS: There were no complications. ENDOSCOPIC IMPRESSION: dysphagia without obvious stricture-status post [facility] dilation  RECOMMENDATIONS: Followup when necessary REPEAT EXAM:  eSigned:  Louis Meckel, MD 07/15/2014 10:32 AM   CC: Porfirio Oar, MD

## 2014-07-15 NOTE — Patient Instructions (Addendum)
Continue amitiza and Linzess. Begin Movantik for constipationYOU HAD AN ENDOSCOPIC PROCEDURE TODAY AT [facility]: Refer to the procedure report that was given to you for any specific questions about what was found during the examination.  If the procedure report does not answer your questions, please call your gastroenterologist to clarify.  If you requested that your care partner not be given the details of your procedure findings, then the procedure report has been included in a sealed envelope for you to review at your convenience later.  YOU SHOULD EXPECT: Some feelings of bloating in the abdomen. Passage of more gas than usual.  Walking can help get rid of the air that was put into your GI tract during the procedure and reduce the bloating. If you had a lower endoscopy (such as a colonoscopy or flexible sigmoidoscopy) you may notice spotting of blood in your stool or on the toilet paper. If you underwent a bowel prep for your procedure, then you may not have a normal bowel movement for a few days.  DIET: Your first meal following the procedure should be a light meal and then it is ok to progress to your normal diet.  A half-sandwich or bowl of soup is an example of a good first meal.  Heavy or fried foods are harder to digest and may make you feel nauseous or bloated.  Likewise meals heavy in dairy and vegetables can cause extra gas to form and this can also increase the bloating.  Drink plenty of fluids but you should avoid alcoholic beverages for 24 hours.  ACTIVITY: Your care partner should take you home directly after the procedure.  You should plan to take it easy, moving slowly for the rest of the day.  You can resume normal activity the day after the procedure however you should NOT DRIVE or use heavy machinery for 24 hours (because of the sedation medicines used during the test).    SYMPTOMS TO REPORT IMMEDIATELY: A gastroenterologist can be reached at any hour.  During  normal business hours, 8:30 AM to 5:00 PM Monday through Friday, call [phone].  After hours and on weekends, please call the GI answering service at [phone] who will take a message and have the physician on call contact you.     Following upper endoscopy (EGD)  Vomiting of blood or coffee ground material  New chest pain or pain under the shoulder blades  Painful or persistently difficult swallowing  New shortness of breath  Fever of 100F or higher  Black, tarry-looking stools  FOLLOW UP: If any biopsies were taken you will be contacted by phone or by letter within the next 1-3 weeks.  Call your gastroenterologist if you have not heard about the biopsies in 3 weeks.  Our staff will call the home number listed on your records the next business day following your procedure to check on you and address any questions or concerns that you may have at that time regarding the information given to you following your procedure. This is a courtesy call and so if there is no answer at the home number and we have not heard from you through the emergency physician on call, we will assume that you have returned to your regular daily activities without incident.  SIGNATURES/CONFIDENTIALITY: You and/or your care partner have signed paperwork which will be entered into your electronic medical record.  These signatures attest to the fact that that the information above on your After Visit Summary  has been reviewed and is understood.  Full responsibility of the confidentiality of this discharge information lies with you and/or your care-partner.  After dilation diet given with instructions.

## 2014-07-15 NOTE — Progress Notes (Signed)
Called to room to assist during endoscopic procedure.  Patient ID and intended procedure confirmed with present staff. Received instructions for my participation in the procedure from the performing physician.  

## 2014-07-16 ENCOUNTER — Telehealth: Payer: Self-pay | Admitting: *Deleted

## 2014-07-16 ENCOUNTER — Encounter: Payer: Self-pay | Admitting: Physician Assistant

## 2014-07-16 DIAGNOSIS — F419 Anxiety disorder, unspecified: Secondary | ICD-10-CM

## 2014-07-16 DIAGNOSIS — G4700 Insomnia, unspecified: Secondary | ICD-10-CM

## 2014-07-16 DIAGNOSIS — G894 Chronic pain syndrome: Secondary | ICD-10-CM

## 2014-07-16 NOTE — Telephone Encounter (Signed)
  Follow up Call-  Call back number 07/15/2014  Post procedure Call Back phone  # [phone] cell  Permission to leave phone message Yes     No answer at # given.  Left message on voicemail.

## 2014-07-17 MED ORDER — OXYCODONE HCL 20 MG PO TABS: 40.0000 mg | ORAL_TABLET | ORAL | Status: DC

## 2014-07-17 MED ORDER — ALPRAZOLAM 1 MG PO TABS: ORAL_TABLET | ORAL | Status: DC

## 2014-07-17 NOTE — Telephone Encounter (Signed)
Patient notified via My Chart.  Meds ordered this encounter  Medications  . ALPRAZolam (XANAX) 1 MG tablet    Sig: Take 1 tablet as needed Morning and mid-day; take 1-3 tablets at bedtime as needed.    Dispense:  105 tablet    Refill:  0    Order Specific Question:  Supervising Provider    Answer:  DOOLITTLE, ROBERT P [[id number]]  . Oxycodone HCl 20 MG TABS    Sig: Take 2 tablets (40 mg total) by mouth every 4 (four) hours.    Dispense:  168 tablet    Refill:  0    The patient has #21 30 mg tabs and #21 15 mg tabs remaining, that she will reserve and use when she drops the dose down to 35 mg.    Order Specific Question:  Supervising Provider    Answer:  Ellamae Sia P [[id number]]

## 2014-07-17 NOTE — Telephone Encounter (Signed)
rxs in drawer.

## 2014-07-29 ENCOUNTER — Encounter: Payer: Self-pay | Admitting: Physician Assistant

## 2014-07-29 DIAGNOSIS — G894 Chronic pain syndrome: Secondary | ICD-10-CM

## 2014-07-29 MED ORDER — OXYCODONE HCL 20 MG PO TABS: 40.0000 mg | ORAL_TABLET | ORAL | Status: DC

## 2014-07-29 NOTE — Telephone Encounter (Signed)
Patient notified via My Chart.  Meds ordered this encounter  Medications  . Oxycodone HCl 20 MG TABS    Sig: Take 2 tablets (40 mg total) by mouth every 4 (four) hours.    Dispense:  168 tablet    Refill:  0    The patient has #21 30 mg tabs and #21 15 mg tabs remaining, that she will reserve and use when she drops the dose down to 35 mg.    Order Specific Question:  Supervising Provider    Answer:  DOOLITTLE, ROBERT P [[id number]]    

## 2014-08-02 ENCOUNTER — Other Ambulatory Visit: Payer: Self-pay | Admitting: Physician Assistant

## 2014-08-05 ENCOUNTER — Other Ambulatory Visit: Payer: Self-pay | Admitting: Physician Assistant

## 2014-08-05 ENCOUNTER — Encounter: Payer: Self-pay | Admitting: Physician Assistant

## 2014-08-05 DIAGNOSIS — F419 Anxiety disorder, unspecified: Secondary | ICD-10-CM

## 2014-08-05 DIAGNOSIS — G4700 Insomnia, unspecified: Secondary | ICD-10-CM

## 2014-08-05 MED ORDER — ALPRAZOLAM 1 MG PO TABS: ORAL_TABLET | ORAL | Status: DC

## 2014-08-05 NOTE — Telephone Encounter (Signed)
Patient notified via My Chart.  Meds ordered this encounter  Medications  . ALPRAZolam (XANAX) 1 MG tablet    Sig: Take 1 tablet as needed Morning and mid-day; take 1-3 tablets at bedtime as needed.    Dispense:  105 tablet    Refill:  0    Order Specific Question:  Supervising Provider    Answer:  DOOLITTLE, ROBERT P [[id number]]

## 2014-08-06 ENCOUNTER — Encounter (HOSPITAL_COMMUNITY): Payer: Self-pay | Admitting: Physician Assistant

## 2014-08-06 ENCOUNTER — Ambulatory Visit (INDEPENDENT_AMBULATORY_CARE_PROVIDER_SITE_OTHER): Payer: 59 | Admitting: Physician Assistant

## 2014-08-06 VITALS — BP 126/78 | HR 74 | Ht 65.0 in | Wt 196.0 lb

## 2014-08-06 DIAGNOSIS — F988 Other specified behavioral and emotional disorders with onset usually occurring in childhood and adolescence: Secondary | ICD-10-CM

## 2014-08-06 DIAGNOSIS — F331 Major depressive disorder, recurrent, moderate: Secondary | ICD-10-CM

## 2014-08-06 DIAGNOSIS — G894 Chronic pain syndrome: Secondary | ICD-10-CM

## 2014-08-06 DIAGNOSIS — F1021 Alcohol dependence, in remission: Secondary | ICD-10-CM

## 2014-08-06 DIAGNOSIS — F1121 Opioid dependence, in remission: Secondary | ICD-10-CM

## 2014-08-06 DIAGNOSIS — F4310 Post-traumatic stress disorder, unspecified: Secondary | ICD-10-CM

## 2014-08-06 DIAGNOSIS — F411 Generalized anxiety disorder: Secondary | ICD-10-CM

## 2014-08-06 MED ORDER — BUSPIRONE HCL 15 MG PO TABS: 15.0000 mg | ORAL_TABLET | Freq: Two times a day (BID) | ORAL | Status: DC

## 2014-08-06 MED ORDER — ARIPIPRAZOLE 5 MG PO TABS: 5.0000 mg | ORAL_TABLET | Freq: Every day | ORAL | Status: DC
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
## 2014-08-06 NOTE — Telephone Encounter (Signed)
Faxed Rx

## 2014-08-06 NOTE — Telephone Encounter (Signed)
[name], I don't see where you have Rxd this for pt before. Do you want to?

## 2014-08-06 NOTE — Patient Instructions (Signed)
1. Continue all medication as ordered. 2. Call this office if you have any questions or concerns. 3. Continue to get regular exercise 3-5 times a week. 4. Continue to eat a healthy nutritionally balanced diet. 5. Continue to reduce stress and anxiety through activities such as yoga, mindfulness, meditation and or prayer. 6. Keep all appointments with your out patient therapist and have notes forwarded to this office. (If you do not have one and would like to be scheduled with a therapist, please let our office assist you with this.) 7. Follow up 2 weeks.

## 2014-08-12 ENCOUNTER — Other Ambulatory Visit: Payer: Self-pay | Admitting: Physician Assistant

## 2014-08-14 ENCOUNTER — Ambulatory Visit (INDEPENDENT_AMBULATORY_CARE_PROVIDER_SITE_OTHER): Payer: 59 | Admitting: Physician Assistant

## 2014-08-14 ENCOUNTER — Encounter: Payer: Self-pay | Admitting: Physician Assistant

## 2014-08-14 VITALS — BP 118/72 | HR 91 | Temp 98.5°F | Resp 16

## 2014-08-14 DIAGNOSIS — G894 Chronic pain syndrome: Secondary | ICD-10-CM

## 2014-08-14 MED ORDER — OXYCODONE HCL 20 MG PO TABS: 40.0000 mg | ORAL_TABLET | ORAL | Status: DC

## 2014-08-14 NOTE — Progress Notes (Signed)
Pt presents to clinic for her Rx for her Oxycodone.  [place] is going [place] to [place] over the weekend and her Rx for Oxy will be due to be filled on Sunday.  Her last Rx was filled 9/28. She went to Psychiatry and was started on Abilify and she is doing early well.  Going out of the house without problems even when she has nothing to do outside of the home.  She is really happy with the results.  Chronic pain disorder - Plan: Oxycodone HCl 20 MG TABS  Benny LennertSarah Chantrell Apsey PA-C  [facility] 08/14/2014 2:27 PM

## 2014-08-16 NOTE — Telephone Encounter (Signed)
Patient came into the office

## 2014-08-19 ENCOUNTER — Encounter: Payer: Self-pay | Admitting: Physician Assistant

## 2014-08-19 DIAGNOSIS — F988 Other specified behavioral and emotional disorders with onset usually occurring in childhood and adolescence: Secondary | ICD-10-CM

## 2014-08-19 MED ORDER — AMPHETAMINE-DEXTROAMPHETAMINE 30 MG PO TABS: 15.0000 mg | ORAL_TABLET | Freq: Three times a day (TID) | ORAL | Status: DC

## 2014-08-19 NOTE — Telephone Encounter (Signed)
Patient notified via My Chart.  Meds ordered this encounter  Medications  . amphetamine-dextroamphetamine (ADDERALL) 30 MG tablet    Sig: Take 0.5 tablets by mouth 3 (three) times daily.    Dispense:  45 tablet    Refill:  0    Order Specific Question:  Supervising Provider    Answer:  DOOLITTLE, ROBERT P [[id number]]

## 2014-08-20 ENCOUNTER — Ambulatory Visit (HOSPITAL_COMMUNITY): Payer: 59 | Admitting: Physician Assistant

## 2014-08-21 ENCOUNTER — Other Ambulatory Visit: Payer: Self-pay

## 2014-08-24 ENCOUNTER — Ambulatory Visit (INDEPENDENT_AMBULATORY_CARE_PROVIDER_SITE_OTHER): Payer: 59 | Admitting: Physician Assistant

## 2014-08-24 VITALS — BP 118/74 | HR 82 | Temp 98.7°F | Resp 18 | Ht 66.25 in | Wt 198.2 lb

## 2014-08-24 DIAGNOSIS — G894 Chronic pain syndrome: Secondary | ICD-10-CM

## 2014-08-24 DIAGNOSIS — F419 Anxiety disorder, unspecified: Secondary | ICD-10-CM

## 2014-08-24 DIAGNOSIS — G4700 Insomnia, unspecified: Secondary | ICD-10-CM

## 2014-08-24 MED ORDER — OXYCODONE HCL 20 MG PO TABS: 40.0000 mg | ORAL_TABLET | ORAL | Status: DC

## 2014-08-24 MED ORDER — ALPRAZOLAM 1 MG PO TABS: ORAL_TABLET | ORAL | Status: DC

## 2014-08-24 NOTE — Progress Notes (Signed)
Subjective:    Patient ID: [name], female    DOB: May 10, 1971, 43 y.o.   MRN: [id number]   PCP: Denise Huckeby, PA-C  Chief Complaint  Patient presents with  . Medication Refill    xanax and oxycodone refills   Medications, allergies, past medical history, surgical history, family history, social history and problem list reviewed and updated.  HPI  This 43 y.o. female presents for medication refills. She's doing well on her current doses.  Recently started Abilify, and found significant improvement in her mood and anxiety.  She's been able to be away from home for longer periods of time and overall feels a lot better.  She's hopeful that the improvement will continue, and will allow for the discontinuation of some of her other medications.  With a more stable mood, she can even envision reduced need for pain medication!  Will likely marry her long-time partner, [name], in the next few months. They've been thinking about it, and with a recent change in [name]'s insurance coverage (no longer covers domestic partners, only married spouses), plan to go ahead.  They had a commitment ceremony about 5 years ago.  She's had some increased knee discomfort, and is thinking about seeing ortho again-hopes that injections may help.  First, though, she's going to work on weight loss.  The Abilify has helped give her more energy and she's hopeful that she'll be able to get in the pool and swim. She's also planning a "cleanse" to get started.   Review of Systems     Objective:   Physical Exam  Constitutional: She is oriented to person, place, and time. She appears well-developed and well-nourished. She is active and cooperative. No distress.  BP 118/74  Pulse 82  Temp(Src) 98.7 F (37.1 C) (Oral)  Resp 18  Ht 5' 6.25" (1.683 m)  Wt 198 lb 3.2 oz (89.903 kg)  BMI 31.74 kg/m2  SpO2 99%   Eyes: Conjunctivae are normal. No scleral icterus.  Neck: No thyromegaly present.    Pulmonary/Chest: Effort normal.  Musculoskeletal:       Cervical back: Normal.       Thoracic back: Normal.       Lumbar back: She exhibits tenderness, bony tenderness and pain.  Lymphadenopathy:    She has no cervical adenopathy.  Neurological: She is alert and oriented to person, place, and time.  Psychiatric: She has a normal mood and affect. Her speech is normal and behavior is normal.          Assessment & Plan:  1. Anxiety Stable/improving with the addition of Abilify. - ALPRAZolam (XANAX) 1 MG tablet; Take 1 tablet as needed Morning and mid-day; take 1-3 tablets at bedtime as needed.  Dispense: 105 tablet; Refill: 0  2. Chronic pain disorder Stable. - Oxycodone HCl 20 MG TABS; Take 2 tablets (40 mg total) by mouth every 4 (four) hours.  Dispense: 168 tablet; Refill: 0  3. Insomnia Stable. - ALPRAZolam (XANAX) 1 MG tablet; Take 1 tablet as needed Morning and mid-day; take 1-3 tablets at bedtime as needed.  Dispense: 105 tablet; Refill: 0   Denise Bras, PA-C Physician Assistant-Certified [facility]

## 2014-08-26 ENCOUNTER — Ambulatory Visit (HOSPITAL_COMMUNITY): Payer: 59 | Admitting: Physician Assistant

## 2014-08-28 ENCOUNTER — Encounter (HOSPITAL_COMMUNITY): Payer: Self-pay | Admitting: Physician Assistant

## 2014-08-28 ENCOUNTER — Ambulatory Visit (INDEPENDENT_AMBULATORY_CARE_PROVIDER_SITE_OTHER): Payer: 59 | Admitting: Physician Assistant

## 2014-08-28 VITALS — BP 131/93 | HR 92 | Ht 66.0 in | Wt 200.0 lb

## 2014-08-28 DIAGNOSIS — F329 Major depressive disorder, single episode, unspecified: Secondary | ICD-10-CM

## 2014-08-28 DIAGNOSIS — F4310 Post-traumatic stress disorder, unspecified: Secondary | ICD-10-CM

## 2014-08-28 DIAGNOSIS — F331 Major depressive disorder, recurrent, moderate: Secondary | ICD-10-CM

## 2014-08-28 DIAGNOSIS — G894 Chronic pain syndrome: Secondary | ICD-10-CM

## 2014-08-28 DIAGNOSIS — F1121 Opioid dependence, in remission: Secondary | ICD-10-CM

## 2014-08-28 DIAGNOSIS — F988 Other specified behavioral and emotional disorders with onset usually occurring in childhood and adolescence: Secondary | ICD-10-CM

## 2014-08-28 DIAGNOSIS — F1021 Alcohol dependence, in remission: Secondary | ICD-10-CM

## 2014-08-28 DIAGNOSIS — F411 Generalized anxiety disorder: Secondary | ICD-10-CM

## 2014-08-28 MED ORDER — ARIPIPRAZOLE 5 MG PO TABS: 5.0000 mg | ORAL_TABLET | Freq: Every day | ORAL | Status: DC

## 2014-08-28 NOTE — Patient Instructions (Signed)
1. Continue medications as noted. 2. Foliow up with Ortho to discuss other options for pain management. 3. RTO in 7-8 weeks.

## 2014-08-28 NOTE — Progress Notes (Signed)
[facility] [phone] Progress Note  [name] [id number] 43 y.o.  08/28/2014 5:00 PM  Chief Complaint: Depression  History of Present Illness: Patient notes that she is better, is now able to leave the house without checking multiple times, sleeping better, has a decrease in symptoms of depression, is even exercising. Patient brought her Eugene Gaviachihuahua Gracie with her today.   Suicidal Ideation: No Plan Formed: No Patient has means to carry out plan: No  Homicidal Ideation: No Plan Formed: No Patient has means to carry out plan: No  Review of Systems: Psychiatric: Agitation: No Hallucination: No Depressed Mood: Yes 2/10 Insomnia: Yes Hypersomnia: No Altered Concentration: No Feels Worthless: No Grandiose Ideas: No Belief In Special Powers: No New/Increased Substance Abuse: No Compulsions: No  Neurologic: Headache: No Seizure: No Paresthesias: No  Past Medical Family, Social History: No changes  Outpatient Encounter Prescriptions as of 08/28/2014  Medication Sig  . acetaminophen (TYLENOL) 500 MG tablet Take 1,000 mg by mouth every 6 (six) hours as needed for moderate pain.  Marland Kitchen. ALPRAZolam (XANAX) 1 MG tablet Take 1 tablet as needed Morning and mid-day; take 1-3 tablets at bedtime as needed.  Marland Kitchen. amLODipine (NORVASC) 10 MG tablet Take 10 mg by mouth at bedtime.  . ARIPiprazole (ABILIFY) 5 MG tablet Take 1 tablet (5 mg total) by mouth daily.  . busPIRone (BUSPAR) 15 MG tablet Take 1 tablet (15 mg total) by mouth 2 (two) times daily.  Marland Kitchen. gabapentin (NEURONTIN) 300 MG capsule TAKE ONE CAPSULE BY MOUTH 3 TIMES A DAY  . Hyoscyamine Sulfate 0.375 MG TBCR Take one tablet twice a day when necessary, abdominal pain  . ibuprofen (ADVIL,MOTRIN) 200 MG tablet Take 600 mg by mouth 2 (two) times daily as needed for pain.  Marland Kitchen. lubiprostone (AMITIZA) 24 MCG capsule Take 1 capsule (24 mcg total) by mouth 2 (two) times daily with a meal.  . ondansetron (ZOFRAN) 4 MG tablet Take 1 tablet  (4 mg total) by mouth every 6 (six) hours.  . ondansetron (ZOFRAN-ODT) 8 MG disintegrating tablet DISSOLVE 1 TABLET BY MOUTH EVERY 8 HOURS AS NEEDED FOR NAUSEA  . Oxycodone HCl 20 MG TABS Take 2 tablets (40 mg total) by mouth every 4 (four) hours.  . pantoprazole (PROTONIX) 40 MG tablet Take 1 tablet (40 mg total) by mouth at bedtime.  . sertraline (ZOLOFT) 100 MG tablet Take 1.5 tablets (150 mg total) by mouth at bedtime.  Marland Kitchen. amphetamine-dextroamphetamine (ADDERALL) 30 MG tablet Take 0.5 tablets by mouth 3 (three) times daily.  . cyclobenzaprine (FLEXERIL) 10 MG tablet Take 1 tablet (10 mg total) by mouth 3 (three) times daily as needed. DUE FOR FOLLOW-UP VISIT  . EPINEPHrine (EPIPEN) 0.3 mg/0.3 mL IJ SOAJ injection Inject 0.3 mLs (0.3 mg total) into the muscle once as needed (Anaphylaxis).  . hyoscyamine (LEVSIN SL) 0.125 MG SL tablet Place 2 tablets (0.25 mg total) under the tongue every 4 (four) hours as needed for cramping.  [place]. Naloxegol Oxalate (MOVANTIK) 25 MG TABS Take 1 tablet by mouth daily.  . promethazine (PHENERGAN) 25 MG tablet Take 1 tablet (25 mg total) by mouth every 6 (six) hours as needed for nausea.  . [DISCONTINUED] pantoprazole (PROTONIX) 40 MG tablet TAKE 1 TABLET BY MOUTH ONCE DAILY    Past Psychiatric History/Hospitalization(s): Anxiety: Yes  4/10 Bipolar Disorder: Yes Depression: Yes Mania: Yes Psychosis: No Schizophrenia: No Personality Disorder: No Hospitalization for psychiatric illness: Yes History of Electroconvulsive Shock Therapy: No Prior Suicide Attempts: Yes  Physical  Exam: Constitutional:  There were no vitals taken for this visit.  General Appearance: alert, oriented, no acute distress  Musculoskeletal: Strength & Muscle Tone: within normal limits Gait & Station: normal Patient leans: N/A  Psychiatric: Speech (describe rate, volume, coherence, spontaneity, and abnormalities if any): clear goal directed  Thought Process (describe rate,  content, abstract reasoning, and computation): normal  Associations: Coherent and Relevant  Thoughts: normal  Mental Status: Orientation: oriented to person, place and time/date Mood & Affect: normal affect Attention Span & Concentration: improved  Medical Decision Making (Choose Three): Established Problem, Stable/Improving (1)  Assessment: Axis I: MDD improving  Axis II:   Axis III:   Axis IV:   Axis V:    Plan:  1. Continue our current plan of medications with goal of decreasing Benzos in the future. 2. Follow up in 8 weeks. Nameer Summer, PA-C 08/28/2014

## 2014-09-07 ENCOUNTER — Ambulatory Visit (INDEPENDENT_AMBULATORY_CARE_PROVIDER_SITE_OTHER): Payer: 59 | Admitting: Physician Assistant

## 2014-09-07 VITALS — BP 118/74 | HR 100 | Temp 99.5°F | Resp 18 | Ht 66.0 in | Wt 197.0 lb

## 2014-09-07 DIAGNOSIS — J329 Chronic sinusitis, unspecified: Secondary | ICD-10-CM

## 2014-09-07 DIAGNOSIS — J40 Bronchitis, not specified as acute or chronic: Secondary | ICD-10-CM

## 2014-09-07 DIAGNOSIS — G894 Chronic pain syndrome: Secondary | ICD-10-CM

## 2014-09-07 MED ORDER — BENZONATATE 100 MG PO CAPS: 100.0000 mg | ORAL_CAPSULE | Freq: Three times a day (TID) | ORAL | Status: DC | PRN

## 2014-09-07 MED ORDER — ALBUTEROL SULFATE HFA 108 (90 BASE) MCG/ACT IN AERS: 2.0000 | INHALATION_SPRAY | RESPIRATORY_TRACT | Status: DC | PRN

## 2014-09-07 MED ORDER — OXYCODONE HCL 20 MG PO TABS: 40.0000 mg | ORAL_TABLET | ORAL | Status: DC

## 2014-09-07 MED ORDER — GUAIFENESIN ER 1200 MG PO TB12: 1.0000 | ORAL_TABLET | Freq: Two times a day (BID) | ORAL | Status: DC | PRN

## 2014-09-07 MED ORDER — CLARITHROMYCIN ER 500 MG PO TB24: 1000.0000 mg | ORAL_TABLET | Freq: Every day | ORAL | Status: AC

## 2014-09-07 NOTE — Patient Instructions (Signed)
Happy Wedding! Hooray for love!  Get plenty of rest and drink at least 64 ounces of water daily.

## 2014-09-07 NOTE — Progress Notes (Signed)
Subjective:    Patient ID: [name], female    DOB: 07/04/1971, 43 y.o.   MRN: [id number]  PCP: Antonios Ostrow, PA-C  Chief Complaint  Patient presents with  . Cough    x3 days   . URI  . Sore Throat    Medications, allergies, past medical history, surgical history, family history, social history and problem list reviewed and updated.  HPI  This 43 y.o. female presents for evaluation of respiratory illness. Also needs a refill of oxycodone.  She and her partner, [name], are getting married tomorrow.  4 days ago experienced chills. 3 days ago developed a sore throat and cough. Then developed head congestion and the sore throat resolved.The cough, productive of green sputum, is worsening, feels like "it's moving down into my chest." Her partner is an Charity fundraiser and reports wheezing on auscultation at night. Chills recurred last night. Some sinus/nasal congestion, but nothing comes out when she blows. Her daughter had similar symptoms last week. No nausea, vomiting or diarrhea.   Review of Systems As above.    Objective:   Physical Exam  Constitutional: She is oriented to person, place, and time. Vital signs are normal. She appears well-developed and well-nourished. No distress.  HENT:  Head: Normocephalic and atraumatic.  Right Ear: Hearing, tympanic membrane, external ear and ear canal normal.  Left Ear: Hearing, tympanic membrane, external ear and ear canal normal.  Nose: Mucosal edema and rhinorrhea present.  No foreign bodies. Right sinus exhibits no maxillary sinus tenderness and no frontal sinus tenderness. Left sinus exhibits no maxillary sinus tenderness and no frontal sinus tenderness.  Mouth/Throat: Uvula is midline, oropharynx is clear and moist and mucous membranes are normal. No uvula swelling. No oropharyngeal exudate.  Eyes: Conjunctivae and EOM are normal. Pupils are equal, round, and reactive to light. Right eye exhibits no discharge. Left eye exhibits no discharge.  No scleral icterus.  Neck: Trachea normal, normal range of motion and full passive range of motion without pain. Neck supple. No thyroid mass and no thyromegaly present.  Cardiovascular: Normal rate, regular rhythm and normal heart sounds.   Pulmonary/Chest: Effort normal and breath sounds normal.  Lymphadenopathy:       Head (right side): No submandibular, no tonsillar, no preauricular, no posterior auricular and no occipital adenopathy present.       Head (left side): No submandibular, no tonsillar, no preauricular and no occipital adenopathy present.    She has no cervical adenopathy.       Right: No supraclavicular adenopathy present.       Left: No supraclavicular adenopathy present.  Neurological: She is alert and oriented to person, place, and time. She has normal strength. No cranial nerve deficit or sensory deficit.  Skin: Skin is warm, dry and intact. No rash noted.  Psychiatric: She has a normal mood and affect. Her speech is normal and behavior is normal.  Vitals reviewed.         Assessment & Plan:  1. Sinobronchitis Supportive care. Anticipatory guidance. - clarithromycin (BIAXIN XL) 500 MG 24 hr tablet; Take 2 tablets (1,000 mg total) by mouth daily.  Dispense: 20 tablet; Refill: 0 - Guaifenesin (MUCINEX MAXIMUM STRENGTH) 1200 MG TB12; Take 1 tablet (1,200 mg total) by mouth every 12 (twelve) hours as needed.  Dispense: 14 tablet; Refill: 1 - benzonatate (TESSALON) 100 MG capsule; Take 1-2 capsules (100-200 mg total) by mouth 3 (three) times daily as needed for cough.  Dispense: 40 capsule; Refill: 0 - albuterol (  PROVENTIL HFA;VENTOLIN HFA) 108 (90 BASE) MCG/ACT inhaler; Inhale 2 puffs into the lungs every 4 (four) hours as needed for wheezing or shortness of breath (cough, shortness of breath or wheezing.).  Dispense: 1 Inhaler; Refill: 1  2. Chronic pain disorder Stable. Continue current dose. - Oxycodone HCl 20 MG TABS; Take 2 tablets (40 mg total) by mouth every 4  (four) hours.  Dispense: 168 tablet; Refill: 0   [name], PA-C Physician Assistant-Certified [facility]

## 2014-09-09 ENCOUNTER — Other Ambulatory Visit: Payer: Self-pay | Admitting: Physician Assistant

## 2014-09-09 DIAGNOSIS — F419 Anxiety disorder, unspecified: Secondary | ICD-10-CM

## 2014-09-09 DIAGNOSIS — G4700 Insomnia, unspecified: Secondary | ICD-10-CM

## 2014-09-09 MED ORDER — ALPRAZOLAM 1 MG PO TABS: ORAL_TABLET | ORAL | Status: DC

## 2014-09-09 NOTE — Progress Notes (Signed)
Rx given to wife, Dustin Flocknnette Andrews.

## 2014-09-19 ENCOUNTER — Encounter: Payer: Self-pay | Admitting: Physician Assistant

## 2014-09-19 DIAGNOSIS — G894 Chronic pain syndrome: Secondary | ICD-10-CM

## 2014-09-21 MED ORDER — OXYCODONE HCL 20 MG PO TABS: 40.0000 mg | ORAL_TABLET | ORAL | Status: DC

## 2014-09-21 NOTE — Telephone Encounter (Signed)
Patient notified via My Chart.  Meds ordered this encounter  Medications  . Oxycodone HCl 20 MG TABS    Sig: Take 2 tablets (40 mg total) by mouth every 4 (four) hours.    Dispense:  168 tablet    Refill:  0    The patient has #21 30 mg tabs and #21 15 mg tabs remaining, that she will reserve and use when she drops the dose down to 35 mg.    Order Specific Question:  Supervising Provider    Answer:  Ellamae SiaOLITTLE, ROBERT P [[id number]]

## 2014-09-30 ENCOUNTER — Ambulatory Visit (INDEPENDENT_AMBULATORY_CARE_PROVIDER_SITE_OTHER): Payer: 59 | Admitting: Physician Assistant

## 2014-09-30 VITALS — BP 126/78 | HR 101 | Temp 98.3°F | Resp 16 | Ht 66.0 in | Wt 194.0 lb

## 2014-09-30 DIAGNOSIS — Z23 Encounter for immunization: Secondary | ICD-10-CM

## 2014-09-30 DIAGNOSIS — G894 Chronic pain syndrome: Secondary | ICD-10-CM

## 2014-09-30 DIAGNOSIS — G4700 Insomnia, unspecified: Secondary | ICD-10-CM

## 2014-09-30 DIAGNOSIS — F419 Anxiety disorder, unspecified: Secondary | ICD-10-CM

## 2014-09-30 MED ORDER — OXYCODONE HCL 20 MG PO TABS: 50.0000 mg | ORAL_TABLET | ORAL | Status: DC

## 2014-09-30 MED ORDER — ALPRAZOLAM 1 MG PO TABS: ORAL_TABLET | ORAL | Status: DC

## 2014-09-30 NOTE — Patient Instructions (Signed)
You may use up what oxycodone you have taking 2.5 tablets each dose.

## 2014-09-30 NOTE — Progress Notes (Signed)
Subjective:    Patient ID: [name], female    DOB: 1971/07/03, 43 y.o.   MRN: [id number]  PCP: Hawkins Seaman, PA-C  Chief Complaint  Patient presents with  . Medication Refill    Allergies  Allergen Reactions  . Imitrex [Sumatriptan Base] Shortness Of Breath and Other (See Comments)    TACHYCARDIA  . Trazodone And Nefazodone Other (See Comments)    NIGHTMARES  . Wellbutrin [Bupropion] Other (See Comments)    "crawling out of my skin"  . Effexor [Venlafaxine] Other (See Comments)    MAKES HER "JITTERY"  . Metoclopramide Hcl Itching  . Morphine And Related Itching  . Tegretol [Carbamazepine] Itching  . Topamax Itching    Patient Active Problem List   Diagnosis Date Noted  . Abdominal pain, right lower quadrant 07/01/2014  . IBS (irritable bowel syndrome) 06/03/2014  . Anxiety 03/09/2014  . Obesity (BMI 30-39.9) 10/27/2013  . Neck pain on left side 09/02/2013  . Cervical radiculopathy 08/18/2013  . Chronic pain disorder 08/16/2013  . Drug-seeking behavior 08/16/2013  . Muscle spasm of left shoulder area 08/16/2013  . Lumbar back pain 11/21/2012  . Esophageal stricture 11/21/2012  . ADD (attention deficit disorder) 11/21/2012  . Hypertriglyceridemia 11/21/2012  . Pain 08/06/2012  . Carpal tunnel syndrome, bilateral 07/05/2012  . OSA (obstructive sleep apnea) 06/28/2012  . Constipation 01/01/2012  . Migraine headache 03/02/2011  . Depression 03/02/2011  . History of substance abuse 03/02/2011  . Hypertension 03/02/2011  . Abdominal pain, epigastric 02/20/2011    Prior to Admission medications   Medication Sig Start Date End Date Taking? Authorizing Provider  acetaminophen (TYLENOL) 500 MG tablet Take 1,000 mg by mouth every 6 (six) hours as needed for moderate pain.   Yes Historical Provider, MD  ALPRAZolam Prudy Feeler) 1 MG tablet Take 1 tablet as needed Morning and mid-day; take 1-3 tablets at bedtime as needed. 09/09/14  Yes Wilton Thrall S Ieasha Boerema, PA-C  amLODipine  (NORVASC) 10 MG tablet Take 10 mg by mouth at bedtime.   Yes Historical Provider, MD  amphetamine-dextroamphetamine (ADDERALL) 30 MG tablet Take 0.5 tablets by mouth 3 (three) times daily. 08/19/14  Yes Harvey Lingo S Tianah Lonardo, PA-C  ARIPiprazole (ABILIFY) 5 MG tablet Take 1 tablet (5 mg total) by mouth daily. 08/28/14 08/28/15 Yes Neil Mashburn, PA-C  busPIRone (BUSPAR) 15 MG tablet Take 1 tablet (15 mg total) by mouth 2 (two) times daily. 08/06/14  Yes Verne Spurr, PA-C  cyclobenzaprine (FLEXERIL) 10 MG tablet Take 1 tablet (10 mg total) by mouth 3 (three) times daily as needed. DUE FOR FOLLOW-UP VISIT 07/10/14  Yes Navil Kole S Envy Meno, PA-C  EPINEPHrine (EPIPEN) 0.3 mg/0.3 mL IJ SOAJ injection Inject 0.3 mLs (0.3 mg total) into the muscle once as needed (Anaphylaxis). 07/01/14  Yes Stephaine Breshears S Joanthan Hlavacek, PA-C  gabapentin (NEURONTIN) 300 MG capsule TAKE ONE CAPSULE BY MOUTH 3 TIMES A DAY 07/08/14  Yes Keiran Sias S Lailyn Appelbaum, PA-C  Guaifenesin (MUCINEX MAXIMUM STRENGTH) 1200 MG TB12 Take 1 tablet (1,200 mg total) by mouth every 12 (twelve) hours as needed. 09/07/14  Yes Najwa Spillane S Donja Tipping, PA-C  Hyoscyamine Sulfate 0.375 MG TBCR Take one tablet twice a day when necessary, abdominal pain 07/01/14  Yes Louis Meckel, MD  ibuprofen (ADVIL,MOTRIN) 200 MG tablet Take 600 mg by mouth 2 (two) times daily as needed for pain.   Yes Historical Provider, MD  lubiprostone (AMITIZA) 24 MCG capsule Take 1 capsule (24 mcg total) by mouth 2 (two) times daily with a meal. 07/01/14  Yes Louis Meckel, MD  Naloxegol Oxalate (MOVANTIK) 25 MG TABS Take 1 tablet by mouth daily. 07/15/14  Yes Louis Meckel, MD  ondansetron (ZOFRAN-ODT) 8 MG disintegrating tablet DISSOLVE 1 TABLET BY MOUTH EVERY 8 HOURS AS NEEDED FOR NAUSEA 08/07/14  Yes Caitrin Pendergraph S Tyjae Shvartsman, PA-C  Oxycodone HCl 20 MG TABS Take 2 tablets (40 mg total) by mouth every 4 (four) hours. 09/21/14  Yes Wyn Nettle S Tishara Pizano, PA-C  pantoprazole (PROTONIX) 40 MG tablet Take 1 tablet (40 mg total) by  mouth at bedtime. 06/03/14  Yes Barkley Kratochvil S Braylen Staller, PA-C  promethazine (PHENERGAN) 25 MG tablet Take 1 tablet (25 mg total) by mouth every 6 (six) hours as needed for nausea. 06/03/14  Yes Lavina Resor S Griselda Bramblett, PA-C  sertraline (ZOLOFT) 100 MG tablet Take 1.5 tablets (150 mg total) by mouth at bedtime. 01/05/14  Yes Allsion Nogales S Lorance Pickeral, PA-C  hyoscyamine (LEVSIN SL) 0.125 MG SL tablet Place 2 tablets (0.25 mg total) under the tongue every 4 (four) hours as needed for cramping. 02/20/11 03/02/11  Louis Meckel, MD    Medical, Surgical, Family and Social History reviewed and updated.  HPI  This 43 y.o. female presents for refill of alprazolam, also to discuss increase in oxycodone dose.  Abilify not as helpful as it was initially. Will be seeing behavioral health soon to discuss dose adjustment.  With the colder weather, having increased pain in the back and knees. Increasing crepitus in both knees.  Unable to stand from crouched position without assistance. Would like to increase back up to 50 mg QID temporarily.  Needs a flu vaccine.  Review of Systems No CP, SOB, HA, dizziness, nausea, vomiting, abdominal pain, diarrhea. No new muscle or joint pains, just increased severity of chronic pain.    Objective:   Physical Exam  Constitutional: She is oriented to person, place, and time. She appears well-developed and well-nourished. She is active and cooperative. No distress.  BP 126/78 mmHg  Pulse 101  Temp(Src) 98.3 F (36.8 C) (Oral)  Resp 16  Ht 5\' 6"  (1.676 m)  Wt 194 lb (87.998 kg)  BMI 31.33 kg/m2  SpO2 99%   Eyes: Conjunctivae are normal.  Pulmonary/Chest: Effort normal.  Neurological: She is alert and oriented to person, place, and time.  Psychiatric: She has a normal mood and affect. Her speech is normal and behavior is normal.          Assessment & Plan:  1. Chronic pain disorder Increase to 50 mg QID. OK to call for the next Rx in 2 weeks, but then needs OV to discuss. Hope to  reduce dose again quickly. - Oxycodone HCl 20 MG TABS; Take 2.5 tablets (50 mg total) by mouth every 4 (four) hours.  Dispense: 210 tablet; Refill: 0  2. Need for influenza vaccination - Flu Vaccine QUAD 36+ mos IM  3. Insomnia 4. Anxiety Stable. Continue as previously. Hope to reduce/d/c with improved symptom control through maintenance medications. - ALPRAZolam (XANAX) 1 MG tablet; Take 1 tablet as needed Morning and mid-day; take 1-3 tablets at bedtime as needed.  Dispense: 105 tablet; Refill: 0  Return in about 1 month (around 10/30/2014).  [name], PA-C Physician Assistant-Certified [facility]

## 2014-10-14 ENCOUNTER — Encounter: Payer: Self-pay | Admitting: Physician Assistant

## 2014-10-14 ENCOUNTER — Other Ambulatory Visit: Payer: Self-pay | Admitting: Physician Assistant

## 2014-10-14 DIAGNOSIS — G894 Chronic pain syndrome: Secondary | ICD-10-CM

## 2014-10-14 MED ORDER — OXYCODONE HCL 20 MG PO TABS: 50.0000 mg | ORAL_TABLET | ORAL | Status: DC

## 2014-10-14 NOTE — Telephone Encounter (Signed)
Patient notified via My Chart.  Meds ordered this encounter  Medications  . Oxycodone HCl 20 MG TABS    Sig: Take 2.5 tablets (50 mg total) by mouth every 4 (four) hours.    Dispense:  210 tablet    Refill:  0    The patient will use her current supply with the new instructions, and then fill this.    Order Specific Question:  Supervising Provider    Answer:  Ellamae SiaOLITTLE, ROBERT P [[id number]]

## 2014-10-14 NOTE — Telephone Encounter (Signed)
Pt picked up.

## 2014-10-16 ENCOUNTER — Ambulatory Visit (HOSPITAL_COMMUNITY): Payer: Self-pay | Admitting: Physician Assistant

## 2014-10-18 ENCOUNTER — Encounter: Payer: Self-pay | Admitting: Physician Assistant

## 2014-10-18 DIAGNOSIS — F419 Anxiety disorder, unspecified: Secondary | ICD-10-CM

## 2014-10-18 DIAGNOSIS — G4700 Insomnia, unspecified: Secondary | ICD-10-CM

## 2014-10-18 DIAGNOSIS — F988 Other specified behavioral and emotional disorders with onset usually occurring in childhood and adolescence: Secondary | ICD-10-CM

## 2014-10-18 MED ORDER — ALPRAZOLAM 1 MG PO TABS: ORAL_TABLET | ORAL | Status: DC

## 2014-10-18 MED ORDER — AMPHETAMINE-DEXTROAMPHETAMINE 30 MG PO TABS: 15.0000 mg | ORAL_TABLET | Freq: Three times a day (TID) | ORAL | Status: DC

## 2014-10-18 NOTE — Telephone Encounter (Signed)
Patient notified via My Chart. Meds ordered this encounter  Medications  . ALPRAZolam (XANAX) 1 MG tablet    Sig: Take 1 tablet as needed Morning and mid-day; take 1-3 tablets at bedtime as needed.    Dispense:  105 tablet    Refill:  0    Order Specific Question:  Supervising Provider    Answer:  DOOLITTLE, ROBERT P [[id number]]  . amphetamine-dextroamphetamine (ADDERALL) 30 MG tablet    Sig: Take 0.5 tablets by mouth 3 (three) times daily.    Dispense:  45 tablet    Refill:  0    Order Specific Question:  Supervising Provider    Answer:  DOOLITTLE, ROBERT P [[id number]]

## 2014-10-25 ENCOUNTER — Encounter: Payer: Self-pay | Admitting: Physician Assistant

## 2014-10-26 ENCOUNTER — Ambulatory Visit (INDEPENDENT_AMBULATORY_CARE_PROVIDER_SITE_OTHER): Payer: 59 | Admitting: Physician Assistant

## 2014-10-26 VITALS — BP 126/82 | HR 92 | Temp 98.0°F | Resp 16 | Ht 66.5 in | Wt 199.6 lb

## 2014-10-26 DIAGNOSIS — F411 Generalized anxiety disorder: Secondary | ICD-10-CM

## 2014-10-26 DIAGNOSIS — G894 Chronic pain syndrome: Secondary | ICD-10-CM

## 2014-10-26 DIAGNOSIS — F331 Major depressive disorder, recurrent, moderate: Secondary | ICD-10-CM

## 2014-10-26 DIAGNOSIS — G4700 Insomnia, unspecified: Secondary | ICD-10-CM

## 2014-10-26 MED ORDER — OXYCODONE HCL 20 MG PO TABS: 50.0000 mg | ORAL_TABLET | ORAL | Status: DC

## 2014-10-26 MED ORDER — CYCLOBENZAPRINE HCL 10 MG PO TABS: 10.0000 mg | ORAL_TABLET | Freq: Three times a day (TID) | ORAL | Status: DC | PRN

## 2014-10-26 MED ORDER — ARIPIPRAZOLE 10 MG PO TABS: 10.0000 mg | ORAL_TABLET | Freq: Every day | ORAL | Status: DC

## 2014-10-26 NOTE — Progress Notes (Signed)
Subjective:    Patient ID: [name], female    DOB: 03/22/1971, 43 y.o.   MRN: [id number]   PCP: Marvel Sapp, PA-C  Chief Complaint  Patient presents with  . Medication Refill  . oxycodone    Allergies  Allergen Reactions  . Imitrex [Sumatriptan Base] Shortness Of Breath and Other (See Comments)    TACHYCARDIA  . Trazodone And Nefazodone Other (See Comments)    NIGHTMARES  . Wellbutrin [Bupropion] Other (See Comments)    "crawling out of my skin"  . Effexor [Venlafaxine] Other (See Comments)    MAKES HER "JITTERY"  . Metoclopramide Hcl Itching  . Morphine And Related Itching  . Tegretol [Carbamazepine] Itching  . Topamax Itching    Patient Active Problem List   Diagnosis Date Noted  . Abdominal pain, right lower quadrant 07/01/2014  . IBS (irritable bowel syndrome) 06/03/2014  . Anxiety 03/09/2014  . Obesity (BMI 30-39.9) 10/27/2013  . Neck pain on left side 09/02/2013  . Cervical radiculopathy 08/18/2013  . Chronic pain disorder 08/16/2013  . Drug-seeking behavior 08/16/2013  . Muscle spasm of left shoulder area 08/16/2013  . Lumbar back pain 11/21/2012  . Esophageal stricture 11/21/2012  . ADD (attention deficit disorder) 11/21/2012  . Hypertriglyceridemia 11/21/2012  . Pain 08/06/2012  . Carpal tunnel syndrome, bilateral 07/05/2012  . OSA (obstructive sleep apnea) 06/28/2012  . Constipation 01/01/2012  . Migraine headache 03/02/2011  . Depression 03/02/2011  . History of substance abuse 03/02/2011  . Hypertension 03/02/2011  . Abdominal pain, epigastric 02/20/2011    Prior to Admission medications   Medication Sig Start Date End Date Taking? Authorizing Provider  acetaminophen (TYLENOL) 500 MG tablet Take 1,000 mg by mouth every 6 (six) hours as needed for moderate pain.   Yes Historical Provider, MD  ALPRAZolam Prudy Feeler) 1 MG tablet Take 1 tablet as needed Morning and mid-day; take 1-3 tablets at bedtime as needed. 10/18/14  Yes Teryl Mcconaghy S Shanel Prazak,  PA-C  amLODipine (NORVASC) 10 MG tablet Take 10 mg by mouth at bedtime.   Yes Historical Provider, MD  amphetamine-dextroamphetamine (ADDERALL) 30 MG tablet Take 0.5 tablets by mouth 3 (three) times daily. 10/18/14  Yes Castulo Scarpelli S Keinan Brouillet, PA-C  ARIPiprazole (ABILIFY) 5 MG tablet Take 1 tablet (5 mg total) by mouth daily. 08/28/14 08/28/15 Yes Neil Mashburn, PA-C  busPIRone (BUSPAR) 15 MG tablet Take 1 tablet (15 mg total) by mouth 2 (two) times daily. 08/06/14  Yes Verne Spurr, PA-C  EPINEPHrine (EPIPEN) 0.3 mg/0.3 mL IJ SOAJ injection Inject 0.3 mLs (0.3 mg total) into the muscle once as needed (Anaphylaxis). 07/01/14  Yes Izora Benn S Delayni Streed, PA-C  gabapentin (NEURONTIN) 300 MG capsule TAKE ONE CAPSULE BY MOUTH 3 TIMES A DAY 07/08/14  Yes Rayni Nemitz S Deaisha Welborn, PA-C  Hyoscyamine Sulfate 0.375 MG TBCR Take one tablet twice a day when necessary, abdominal pain 07/01/14  Yes Louis Meckel, MD  ibuprofen (ADVIL,MOTRIN) 200 MG tablet Take 600 mg by mouth 2 (two) times daily as needed for pain.   Yes Historical Provider, MD  lubiprostone (AMITIZA) 24 MCG capsule Take 1 capsule (24 mcg total) by mouth 2 (two) times daily with a meal. 07/01/14  Yes Louis Meckel, MD  Naloxegol Oxalate (MOVANTIK) 25 MG TABS Take 1 tablet by mouth daily. 07/15/14  Yes Louis Meckel, MD  ondansetron (ZOFRAN-ODT) 8 MG disintegrating tablet DISSOLVE 1 TABLET BY MOUTH EVERY 8 HOURS AS NEEDED FOR NAUSEA 08/07/14  Yes Noele Icenhour Tessa Lerner, PA-C  Oxycodone HCl 20  MG TABS Take 2.5 tablets (50 mg total) by mouth every 4 (four) hours. 10/14/14  Yes Lakie Mclouth S Tobe Kervin, PA-C  pantoprazole (PROTONIX) 40 MG tablet Take 1 tablet (40 mg total) by mouth at bedtime. 06/03/14  Yes Kaveri Perras S Levina Boyack, PA-C  sertraline (ZOLOFT) 100 MG tablet TAKE 1 AND 1/2 TABLETS BY MOUTH AT BEDTIME 10/14/14  Yes Kearston Putman S Roemello Speyer, PA-C  cyclobenzaprine (FLEXERIL) 10 MG tablet Take 1 tablet (10 mg total) by mouth 3 (three) times daily as needed. DUE FOR FOLLOW-UP VISIT Patient not  taking: Reported on 10/26/2014 07/10/14   Huey Scalia S Issa Luster, PA-C  hyoscyamine (LEVSIN SL) 0.125 MG SL tablet Place 2 tablets (0.25 mg total) under the tongue every 4 (four) hours as needed for cramping. 02/20/11 03/02/11  Louis Meckel, MD    Medical, Surgical, Family and Social History reviewed and updated.  HPI  Presents for refill of oxycodone. Current dose is working well-increased at last visit.  Pain in knees and back is getting to be too much to bear.  Unable to get out of the bathtub.  She's previously said that she didn't want surgery, but is interested in revisiting the options for treatment now.  She recently re-established with psychiatry, but feels like it wasn't a good fit. Would like recommendation for someone else. At her last visit she declined the recommended increase in Abilify dose, but now thinks that she needs it. Asks if I will increase it for her while she's establishing with another provider.  The question of Bipolar disorder has come up again, and her insomnia has worsened.  Review of Systems No chest pain, SOB, HA, dizziness, vision change, diarrhea, constipation, dysuria, urinary urgency or frequency, or rash.     Objective:   Physical Exam  Constitutional: She is oriented to person, place, and time. She appears well-developed and well-nourished. She is active and cooperative. No distress.  BP 126/82 mmHg  Pulse 92  Temp(Src) 98 F (36.7 C)  Resp 16  Ht 5' 6.5" (1.689 m)  Wt 199 lb 9.6 oz (90.538 kg)  BMI 31.74 kg/m2  SpO2 100%   Eyes: Conjunctivae are normal.  Pulmonary/Chest: Effort normal.  Musculoskeletal:       Thoracic back: Normal.       Lumbar back: She exhibits decreased range of motion, tenderness, bony tenderness and pain. She exhibits no swelling, no edema, no deformity and no spasm.  Neurological: She is alert and oriented to person, place, and time. She has normal strength. No cranial nerve deficit or sensory deficit.  Reflex Scores:       Patellar reflexes are 2+ on the right side and 2+ on the left side.      Achilles reflexes are 2+ on the right side and 2+ on the left side. Psychiatric: She has a normal mood and affect. Her speech is normal and behavior is normal.          Assessment & Plan:  1. Chronic pain disorder Continue current treatment. Ask that Dr. Nickola Major evaluate her for treatment options. - cyclobenzaprine (FLEXERIL) 10 MG tablet; Take 1 tablet (10 mg total) by mouth 3 (three) times daily as needed.  Dispense: 30 tablet; Refill: 0 - Oxycodone HCl 20 MG TABS; Take 2.5 tablets (50 mg total) by mouth every 4 (four) hours.  Dispense: 210 tablet; Refill: 0 - Ambulatory referral to Physical Medicine Rehab  2. Insomnia 3. MDD (major depressive disorder), recurrent episode, moderate 4. GAD (generalized anxiety disorder) Increase Abilify to 10  mg daily. Recommended alternate providers. - ARIPiprazole (ABILIFY) 10 MG tablet; Take 1 tablet (10 mg total) by mouth daily.  Dispense: 30 tablet; Refill: 1 - Ambulatory referral to Psychiatry   [name], PA-C Physician Assistant-Certified [facility]

## 2014-11-05 ENCOUNTER — Encounter: Payer: Self-pay | Admitting: Physician Assistant

## 2014-11-05 DIAGNOSIS — F419 Anxiety disorder, unspecified: Secondary | ICD-10-CM

## 2014-11-05 DIAGNOSIS — G4700 Insomnia, unspecified: Secondary | ICD-10-CM

## 2014-11-06 ENCOUNTER — Encounter: Payer: Self-pay | Admitting: Physician Assistant

## 2014-11-06 MED ORDER — ALPRAZOLAM 1 MG PO TABS: ORAL_TABLET | ORAL | Status: DC

## 2014-11-06 NOTE — Telephone Encounter (Signed)
Meds ordered this encounter  Medications  . ALPRAZolam (XANAX) 1 MG tablet    Sig: Take 1 tablet as needed Morning and mid-day; take 1-3 tablets at bedtime as needed.    Dispense:  105 tablet    Refill:  0    Order Specific Question:  Supervising Provider    Answer:  Tonye Pearson [[id number]]   Patient notified via My Chart.

## 2014-11-06 NOTE — Telephone Encounter (Signed)
LMOM for pt to CB to let me know if she wants to p/up or have faxed somewhere. Didn't want to fax to [facility] since they are closed today and over the weekend.

## 2014-11-07 ENCOUNTER — Ambulatory Visit (INDEPENDENT_AMBULATORY_CARE_PROVIDER_SITE_OTHER): Payer: 59 | Admitting: Physician Assistant

## 2014-11-07 VITALS — BP 118/86 | HR 98 | Temp 97.7°F | Resp 17 | Ht 65.5 in | Wt 198.8 lb

## 2014-11-07 DIAGNOSIS — G894 Chronic pain syndrome: Secondary | ICD-10-CM

## 2014-11-07 DIAGNOSIS — I10 Essential (primary) hypertension: Secondary | ICD-10-CM

## 2014-11-07 MED ORDER — AMLODIPINE BESYLATE 10 MG PO TABS: 10.0000 mg | ORAL_TABLET | Freq: Every day | ORAL | Status: DC

## 2014-11-07 MED ORDER — OXYCODONE HCL 20 MG PO TABS: 50.0000 mg | ORAL_TABLET | ORAL | Status: DC

## 2014-11-07 NOTE — Progress Notes (Signed)
Subjective:    Patient ID: [name], female    DOB: [birth year], 44 y.o.   MRN: [id number]  HPI Pt presents to clinic for her medication refill.  It is time for her biweekly refill of her oxycodone which she takes every 4 hours- she will need it filled tomorrow but she has to take care of her grandmother and will not have time to visit the pharmacy.  She typically gets her medications filled at [facility] but the the holidays she has gotten off track with her refills there. She also has realized she has been out of Norvasc for the last 4 days.  She feels fine without chest pain   Review of Systems  Cardiovascular: Negative for chest pain and palpitations.   Patient Active Problem List   Diagnosis Date Noted  . Abdominal pain, right lower quadrant 07/01/2014  . IBS (irritable bowel syndrome) 06/03/2014  . Anxiety 03/09/2014  . Obesity (BMI 30-39.9) 10/27/2013  . Neck pain on left side 09/02/2013  . Cervical radiculopathy 08/18/2013  . Chronic pain disorder 08/16/2013  . Drug-seeking behavior 08/16/2013  . Muscle spasm of left shoulder area 08/16/2013  . Lumbar back pain 11/21/2012  . Esophageal stricture 11/21/2012  . ADD (attention deficit disorder) 11/21/2012  . Hypertriglyceridemia 11/21/2012  . Pain 08/06/2012  . Carpal tunnel syndrome, bilateral 07/05/2012  . OSA (obstructive sleep apnea) 06/28/2012  . Constipation 01/01/2012  . Migraine headache 03/02/2011  . Depression 03/02/2011  . History of substance abuse 03/02/2011  . Hypertension 03/02/2011  . Abdominal pain, epigastric 02/20/2011   Prior to Admission medications   Medication Sig Start Date End Date Taking? Authorizing Provider  acetaminophen (TYLENOL) 500 MG tablet Take 1,000 mg by mouth every 6 (six) hours as needed for moderate pain.   Yes Historical Provider, MD  ALPRAZolam Prudy Feeler) 1 MG tablet Take 1 tablet as needed Morning and mid-day; take 1-3 tablets at bedtime as needed. 11/06/14  Yes [name] S Jeffery,  PA-C  amLODipine (NORVASC) 10 MG tablet Take 1 tablet (10 mg total) by mouth at bedtime. 11/07/14  Yes Morrell Riddle, PA-C  amphetamine-dextroamphetamine (ADDERALL) 30 MG tablet Take 0.5 tablets by mouth 3 (three) times daily. 10/18/14  Yes [name] S Jeffery, PA-C  ARIPiprazole (ABILIFY) 10 MG tablet Take 1 tablet (10 mg total) by mouth daily. 10/26/14 10/26/15 Yes [name] S Jeffery, PA-C  busPIRone (BUSPAR) 15 MG tablet Take 1 tablet (15 mg total) by mouth 2 (two) times daily. 08/06/14  Yes Verne Spurr, PA-C  cyclobenzaprine (FLEXERIL) 10 MG tablet Take 1 tablet (10 mg total) by mouth 3 (three) times daily as needed. 10/26/14  Yes [name] S Jeffery, PA-C  EPINEPHrine (EPIPEN) 0.3 mg/0.3 mL IJ SOAJ injection Inject 0.3 mLs (0.3 mg total) into the muscle once as needed (Anaphylaxis). 07/01/14  Yes [name] S Jeffery, PA-C  gabapentin (NEURONTIN) 300 MG capsule TAKE ONE CAPSULE BY MOUTH 3 TIMES A DAY 07/08/14  Yes [name] S Jeffery, PA-C  Hyoscyamine Sulfate 0.375 MG TBCR Take one tablet twice a day when necessary, abdominal pain 07/01/14  Yes Louis Meckel, MD  ibuprofen (ADVIL,MOTRIN) 200 MG tablet Take 600 mg by mouth 2 (two) times daily as needed for pain.   Yes Historical Provider, MD  lubiprostone (AMITIZA) 24 MCG capsule Take 1 capsule (24 mcg total) by mouth 2 (two) times daily with a meal. 07/01/14  Yes Louis Meckel, MD  Naloxegol Oxalate (MOVANTIK) 25 MG TABS Take 1 tablet by mouth daily.  07/15/14  Yes Louis Meckel, MD  ondansetron (ZOFRAN-ODT) 8 MG disintegrating tablet DISSOLVE 1 TABLET BY MOUTH EVERY 8 HOURS AS NEEDED FOR NAUSEA 08/07/14  Yes [name] S Jeffery, PA-C  Oxycodone HCl 20 MG TABS Take 2.5 tablets (50 mg total) by mouth every 4 (four) hours. 11/07/14  Yes Morrell Riddle, PA-C  pantoprazole (PROTONIX) 40 MG tablet Take 1 tablet (40 mg total) by mouth at bedtime. 06/03/14  Yes [name] S Jeffery, PA-C  sertraline (ZOLOFT) 100 MG tablet TAKE 1 AND 1/2 TABLETS BY MOUTH AT BEDTIME 10/14/14  Yes  [name] S Jeffery, PA-C  hyoscyamine (LEVSIN SL) 0.125 MG SL tablet Place 2 tablets (0.25 mg total) under the tongue every 4 (four) hours as needed for cramping. 02/20/11 03/02/11  Louis Meckel, MD   Allergies  Allergen Reactions  . Imitrex [Sumatriptan Base] Shortness Of Breath and Other (See Comments)    TACHYCARDIA  . Trazodone And Nefazodone Other (See Comments)    NIGHTMARES  . Wellbutrin [Bupropion] Other (See Comments)    "crawling out of my skin"  . Effexor [Venlafaxine] Other (See Comments)    MAKES HER "JITTERY"  . Metoclopramide Hcl Itching  . Morphine And Related Itching  . Tegretol [Carbamazepine] Itching  . Topamax Itching    Medications, allergies, past medical history, surgical history, family history, social history and problem list reviewed and updated.      Objective:   Physical Exam  Constitutional: She is oriented to person, place, and time. She appears well-developed and well-nourished.  BP 118/86 mmHg  Pulse 98  Temp(Src) 97.7 F (36.5 C) (Oral)  Resp 17  Ht 5' 5.5" (1.664 m)  Wt 198 lb 12.8 oz (90.175 kg)  BMI 32.57 kg/m2  SpO2 98%   HENT:  Head: Normocephalic and atraumatic.  Right Ear: External ear normal.  Left Ear: External ear normal.  Pulmonary/Chest: Effort normal.  Neurological: She is alert and oriented to person, place, and time.  Skin: Skin is warm and dry.  Psychiatric: She has a normal mood and affect. Her behavior is normal. Judgment and thought content normal.       Assessment & Plan:  Chronic pain disorder - Gave her her 2 weeks supply - ok'd her to fill 11/07/2014 due to inability to get to the pharmacy tomorrow.  Plan: Oxycodone HCl 20 MG TABS,   Essential hypertension -  Well controlled.  Plan: amLODipine (NORVASC) 10 MG tablet,  Benny Lennert PA-C  [facility] 11/07/2014 3:22 PM

## 2014-11-09 NOTE — Telephone Encounter (Signed)
[name],  I'm sorry that I did not get this message on Friday! I see that [name] took care of things Saturday, though.  Happy New Year! Warmly, [name]

## 2014-11-12 ENCOUNTER — Encounter: Payer: Self-pay | Admitting: Physician Assistant

## 2014-11-17 ENCOUNTER — Encounter: Payer: Self-pay | Admitting: Physician Assistant

## 2014-11-17 DIAGNOSIS — G894 Chronic pain syndrome: Secondary | ICD-10-CM

## 2014-11-18 MED ORDER — OXYCODONE HCL 20 MG PO TABS: 50.0000 mg | ORAL_TABLET | ORAL | Status: DC

## 2014-11-18 NOTE — Telephone Encounter (Signed)
Patient notified via My Chart.  Meds ordered this encounter  Medications  . Oxycodone HCl 20 MG TABS    Sig: Take 2.5 tablets (50 mg total) by mouth every 4 (four) hours.    Dispense:  210 tablet    Refill:  0    Order Specific Question:  Supervising Provider    Answer:  DOOLITTLE, ROBERT P [[id number]]    

## 2014-11-23 ENCOUNTER — Encounter: Payer: Self-pay | Admitting: Physician Assistant

## 2014-11-23 DIAGNOSIS — F419 Anxiety disorder, unspecified: Secondary | ICD-10-CM

## 2014-11-23 DIAGNOSIS — G4700 Insomnia, unspecified: Secondary | ICD-10-CM

## 2014-11-23 MED ORDER — ALPRAZOLAM 1 MG PO TABS: ORAL_TABLET | ORAL | Status: DC

## 2014-11-23 NOTE — Telephone Encounter (Signed)
Patient notified via My Chart.  Meds ordered this encounter  Medications  . ALPRAZolam (XANAX) 1 MG tablet    Sig: Take 1 tablet as needed Morning and mid-day; take 1-3 tablets at bedtime as needed.    Dispense:  105 tablet    Refill:  0    Order Specific Question:  Supervising Provider    Answer:  DOOLITTLE, ROBERT P [[id number]]    

## 2014-11-24 NOTE — Telephone Encounter (Signed)
Called pt who req'd I [facility]. Done.

## 2014-11-28 ENCOUNTER — Encounter: Payer: Self-pay | Admitting: Physician Assistant

## 2014-11-28 DIAGNOSIS — G894 Chronic pain syndrome: Secondary | ICD-10-CM

## 2014-11-29 ENCOUNTER — Encounter: Payer: Self-pay | Admitting: Physician Assistant

## 2014-11-29 MED ORDER — OXYCODONE HCL 20 MG PO TABS: 50.0000 mg | ORAL_TABLET | ORAL | Status: DC

## 2014-11-29 NOTE — Telephone Encounter (Signed)
Patient notified via My Chart

## 2014-12-02 ENCOUNTER — Other Ambulatory Visit: Payer: Self-pay | Admitting: Physician Assistant

## 2014-12-06 ENCOUNTER — Emergency Department (HOSPITAL_COMMUNITY)
Admission: EM | Admit: 2014-12-06 | Discharge: 2014-12-06 | Disposition: A | Discharge status: Home / Self Care | Payer: 59 | Attending: Emergency Medicine | Admitting: Emergency Medicine

## 2014-12-06 ENCOUNTER — Encounter (HOSPITAL_COMMUNITY): Payer: Self-pay | Admitting: Cardiology

## 2014-12-06 DIAGNOSIS — F419 Anxiety disorder, unspecified: Secondary | ICD-10-CM | POA: Diagnosis not present

## 2014-12-06 DIAGNOSIS — G8929 Other chronic pain: Secondary | ICD-10-CM | POA: Diagnosis not present

## 2014-12-06 DIAGNOSIS — G43909 Migraine, unspecified, not intractable, without status migrainosus: Secondary | ICD-10-CM | POA: Diagnosis not present

## 2014-12-06 DIAGNOSIS — Z87828 Personal history of other (healed) physical injury and trauma: Secondary | ICD-10-CM | POA: Insufficient documentation

## 2014-12-06 DIAGNOSIS — M62830 Muscle spasm of back: Secondary | ICD-10-CM | POA: Diagnosis not present

## 2014-12-06 DIAGNOSIS — Z87891 Personal history of nicotine dependence: Secondary | ICD-10-CM | POA: Insufficient documentation

## 2014-12-06 DIAGNOSIS — Z98811 Dental restoration status: Secondary | ICD-10-CM | POA: Diagnosis not present

## 2014-12-06 DIAGNOSIS — Z79899 Other long term (current) drug therapy: Secondary | ICD-10-CM | POA: Diagnosis not present

## 2014-12-06 DIAGNOSIS — I10 Essential (primary) hypertension: Secondary | ICD-10-CM | POA: Insufficient documentation

## 2014-12-06 DIAGNOSIS — M62838 Other muscle spasm: Secondary | ICD-10-CM | POA: Insufficient documentation

## 2014-12-06 DIAGNOSIS — Z87442 Personal history of urinary calculi: Secondary | ICD-10-CM | POA: Insufficient documentation

## 2014-12-06 DIAGNOSIS — K219 Gastro-esophageal reflux disease without esophagitis: Secondary | ICD-10-CM | POA: Insufficient documentation

## 2014-12-06 DIAGNOSIS — M1712 Unilateral primary osteoarthritis, left knee: Secondary | ICD-10-CM | POA: Insufficient documentation

## 2014-12-06 DIAGNOSIS — F329 Major depressive disorder, single episode, unspecified: Secondary | ICD-10-CM | POA: Insufficient documentation

## 2014-12-06 DIAGNOSIS — M546 Pain in thoracic spine: Secondary | ICD-10-CM | POA: Diagnosis present

## 2014-12-06 DIAGNOSIS — Z8619 Personal history of other infectious and parasitic diseases: Secondary | ICD-10-CM | POA: Insufficient documentation

## 2014-12-06 MED ORDER — HYDROMORPHONE HCL 1 MG/ML IJ SOLN
2.0000 mg | Freq: Once | INTRAMUSCULAR | Status: AC
Start: 2014-12-06 — End: 2014-12-06
  Administered 2014-12-06: 2 mg via INTRAMUSCULAR
  Filled 2014-12-06: qty 2

## 2014-12-06 MED ORDER — CYCLOBENZAPRINE HCL 10 MG PO TABS: 10.0000 mg | ORAL_TABLET | Freq: Two times a day (BID) | ORAL | Status: DC | PRN

## 2014-12-06 NOTE — ED Notes (Signed)
Pt reports she was feeding her lizards yesterday and raised her right arm. Pt reports she is now having back pain.

## 2014-12-06 NOTE — ED Provider Notes (Signed)
CSN: [id number]     Arrival date & time 12/06/14  1223 History   First MD Initiated Contact with Patient 12/06/14 1302     Chief Complaint  Patient presents with  . Back Pain     (Consider location/radiation/quality/duration/timing/severity/associated sxs/prior Treatment) HPI Comments: Patients with history of chronic pain presents with complaint of right scapular pain and tightness started last evening after she raised her arm up while feeding the patent. Patient describes the pain as a tightness. It does not radiate. It is worse with movement of her arm and back. No chest pain or cough. No abdominal pain. No nausea or vomiting. Patient takes oxycodone 20 mg tablets every 4 hours. Patient took Robaxin last night without relief. She has received better relief from Flexeril in the past. No other treatments prior to arrival.  Patient is a 44 y.o. female presenting with back pain. The history is provided by the patient and medical records.  Back Pain Associated symptoms: no fever, no numbness and no weakness     Past Medical History  Diagnosis Date  . Anxiety   . GERD (gastroesophageal reflux disease)   . History of alcohol abuse     no alcohol since 2004  . Migraine headache   . History of cocaine abuse     none since 2011, entered treatment  . History of kidney stones   . Arthritis     left knee  . Depression   . Complication of anesthesia 03/2011    history of aspiration after anesthesia  . History of esophageal dilatation     multiple  . History of pancreatitis 2012  . Hypertension     under control with med., has been on med. x 3 yr.  . Medial meniscus tear 07/2013    right  . Cervical muscle strain 07/2013    current Prednisone taper  . Dental crowns present     x 2 upper front  . Genital herpes simplex type 2   . Herniated disc, cervical   . Substance abuse   . Sleep apnea     not wearing c-pap   Past Surgical History  Procedure Laterality Date  . Abdominal  hysterectomy      partial  . Cesarean section      x 2  . Nasal septum surgery    . Carpal tunnel release  07/29/2012    Procedure: CARPAL TUNNEL RELEASE;  Surgeon: Eldred MangesMark C Yates, MD;  Location: [facility];  Service: Orthopedics;  Laterality: Right;  Right carpal tunnel release  . Laparoscopic appendectomy  02/21/2006  . Laparoscopic lysis of adhesions  02/21/2006  . Cholecystectomy  04/05/2010  . Ercp w/ metal stent placement  03/14/2011  . Cystoscopy w/ ureteral stent placement Right 06/11/2007    stent removed  . Nasal septoplasty w/ turbinoplasty Bilateral 07/26/2009    bilat. turb. reduction  . Knee arthroscopy Left     x 3  . Knee arthroscopy with medial menisectomy Right 07/31/2013    Procedure: KNEE ARTHROSCOPY WITH PARTIAL LATERAL MENISECTOMY, CHONDROPLASTY;  Surgeon: Loreta Aveaniel F Murphy, MD;  Location:  SURGERY CENTER;  Service: Orthopedics;  Laterality: Right;  . Appendectomy    . Colonoscopy    . Ercp    . Upper gastrointestinal endoscopy      with dilatation   Family History  Problem Relation Age of Onset  . Heart disease Maternal Uncle   . Heart disease Maternal Grandmother   . Kidney cancer Mother   .  Hypertension Mother   . Cancer Mother     renal  . Alcohol abuse Mother   . Lung cancer Maternal Aunt   . Stroke Maternal Uncle 55  . Alcohol abuse Maternal Uncle   . Colon cancer Neg Hx   . Prostate cancer Neg Hx   . Esophageal cancer Neg Hx   . Pancreatic cancer Neg Hx   . Rectal cancer Neg Hx   . Stomach cancer Neg Hx    History  Substance Use Topics  . Smoking status: Former Smoker    Quit date: 06/05/2013  . Smokeless tobacco: Never Used  . Alcohol Use: No     Comment: none since 2004   OB History    No data available     Review of Systems  Constitutional: Negative for fever and unexpected weight change.  Gastrointestinal: Negative for constipation.  Musculoskeletal: Positive for back pain. Negative for neck pain.  Neurological:  Negative for weakness and numbness.       Denies saddle paresthesias.      Allergies  Imitrex; Trazodone and nefazodone; Wellbutrin; Effexor; Metoclopramide hcl; Morphine and related; Tegretol; and Topamax  Home Medications   Prior to Admission medications   Medication Sig Start Date End Date Taking? Authorizing Provider  acetaminophen (TYLENOL) 500 MG tablet Take 1,000 mg by mouth every 6 (six) hours as needed for moderate pain.    Historical Provider, MD  ALPRAZolam Prudy Feeler) 1 MG tablet Take 1 tablet as needed Morning and mid-day; take 1-3 tablets at bedtime as needed. 11/23/14   [name] S Jeffery, PA-C  amLODipine (NORVASC) 10 MG tablet Take 1 tablet (10 mg total) by mouth at bedtime. 11/07/14   Morrell Riddle, PA-C  amphetamine-dextroamphetamine (ADDERALL) 30 MG tablet Take 0.5 tablets by mouth 3 (three) times daily. 10/18/14   [name] S Jeffery, PA-C  ARIPiprazole (ABILIFY) 10 MG tablet Take 1 tablet (10 mg total) by mouth daily. 10/26/14 10/26/15  [name] S Jeffery, PA-C  busPIRone (BUSPAR) 15 MG tablet Take 1 tablet (15 mg total) by mouth 2 (two) times daily. 08/06/14   Verne Spurr, PA-C  cyclobenzaprine (FLEXERIL) 10 MG tablet Take 1 tablet (10 mg total) by mouth 2 (two) times daily as needed for muscle spasms. 12/06/14   Renne Crigler, PA-C  EPINEPHrine (EPIPEN) 0.3 mg/0.3 mL IJ SOAJ injection Inject 0.3 mLs (0.3 mg total) into the muscle once as needed (Anaphylaxis). 07/01/14   [name] S Jeffery, PA-C  gabapentin (NEURONTIN) 300 MG capsule TAKE ONE CAPSULE BY MOUTH 3 TIMES A DAY 07/08/14   [name] S Jeffery, PA-C  hyoscyamine (LEVSIN SL) 0.125 MG SL tablet Place 2 tablets (0.25 mg total) under the tongue every 4 (four) hours as needed for cramping. 02/20/11 03/02/11  Louis Meckel, MD  Hyoscyamine Sulfate 0.375 MG TBCR Take one tablet twice a day when necessary, abdominal pain 07/01/14   Louis Meckel, MD  ibuprofen (ADVIL,MOTRIN) 200 MG tablet Take 600 mg by mouth 2 (two) times daily as needed  for pain.    Historical Provider, MD  lubiprostone (AMITIZA) 24 MCG capsule Take 1 capsule (24 mcg total) by mouth 2 (two) times daily with a meal. 07/01/14   Louis Meckel, MD  Naloxegol Oxalate (MOVANTIK) 25 MG TABS Take 1 tablet by mouth daily. 07/15/14   Louis Meckel, MD  ondansetron (ZOFRAN-ODT) 8 MG disintegrating tablet DISSOLVE 1 TABLET BY MOUTH EVERY 8 HOURS AS NEEDED FOR NAUSEA 08/07/14   [name] Tessa Lerner, PA-C  Oxycodone HCl  20 MG TABS Take 2.5 tablets (50 mg total) by mouth every 4 (four) hours. 11/29/14   [name] S Jeffery, PA-C  pantoprazole (PROTONIX) 40 MG tablet Take 1 tablet (40 mg total) by mouth at bedtime. 06/03/14   [name] Tessa Lerner, PA-C  sertraline (ZOLOFT) 100 MG tablet TAKE 1 AND 1/2 TABLETS BY MOUTH AT BEDTIME 10/14/14   [name] S Jeffery, PA-C   BP 133/86 mmHg  Pulse 92  Temp(Src) 98.3 F (36.8 C) (Oral)  Resp 16  Ht  (1.676 m)  Wt 200 lb (90.719 kg)  BMI 32.30 kg/m2  SpO2 100%   Physical Exam  Constitutional: She appears well-developed and well-nourished.  HENT:  Head: Normocephalic and atraumatic.  Eyes: Conjunctivae are normal.  Neck: Normal range of motion. Neck supple.  Pulmonary/Chest: Effort normal.  Abdominal: Soft. There is no tenderness. There is no CVA tenderness.  Musculoskeletal: Normal range of motion. She exhibits tenderness. She exhibits no edema.       Right shoulder: She exhibits tenderness, pain and spasm. She exhibits normal range of motion and normal strength.       Left shoulder: Normal.       Cervical back: She exhibits normal range of motion, no tenderness and no bony tenderness.       Thoracic back: She exhibits tenderness. She exhibits normal range of motion and no bony tenderness.       Back:       Arms: No step-off noted with palpation of spine.   Neurological: She is alert. She has normal strength and normal reflexes. No sensory deficit.  5/5 strength in entire lower extremities bilaterally. No sensation deficit.   Skin:  Skin is warm and dry. No rash noted.  Psychiatric: She has a normal mood and affect.  Nursing note and vitals reviewed.   ED Course  Procedures (including critical care time) Labs Review Labs Reviewed - No data to display  Imaging Review No results found.   EKG Interpretation None       1:54 PM Patient seen and examined. Medications ordered.   Vital signs reviewed and are as follows: Filed Vitals:   12/06/14 1303  BP: 133/86  Pulse: 92  Temp: 98.3 F (36.8 C)  Resp: 16    No red flag s/s of low back pain. Patient was counseled on back pain precautions and told to do activity as tolerated but do not lift, push, or pull heavy objects more than 10 pounds for the next week.  Patient counseled to use ice or heat on back for no longer than 15 minutes every hour.   Patient prescribed muscle relaxer and counseled on proper use of muscle relaxant medication.    Urged patient not to drink alcohol, drive, or perform any other activities that requires focus while taking either of these medications.  Patient urged to follow-up with PCP if pain does not improve with treatment and rest or if pain becomes recurrent. Urged to return with worsening severe pain, loss of bowel or bladder control, trouble walking.   The patient verbalizes understanding and agrees with the plan.   MDM   Final diagnoses:  Muscle spasm of back   Patient with reproducible right scapular tenderness after raising her arm. Worse with movement and palpation. Palpable spasm felt. No neurological symptoms in upper extremities. No abdominal pain, chest pain or shortness of breath to suggest abdominal etiology or PE.    Renne Crigler, PA-C 12/06/14 1355  Gerhard Munch, MD 12/06/14 1537

## 2014-12-06 NOTE — ED Notes (Signed)
Declined W/C at D/C and was escorted to lobby by RN. 

## 2014-12-06 NOTE — Discharge Instructions (Signed)
Please read and follow all provided instructions.  Your diagnoses today include:  1. Muscle spasm of back     Tests performed today include:  Vital signs - see below for your results today  Medications prescribed:   Flexeril (cyclobenzaprine) - muscle relaxer medication  DO NOT drive or perform any activities that require you to be awake and alert because this medicine can make you drowsy.   Take any prescribed medications only as directed.  Home care instructions:   Follow any educational materials contained in this packet  Please rest, use ice or heat on your back for the next several days  Do not lift, push, pull anything more than 10 pounds for the next week  Follow-up instructions: Please follow-up with your primary care provider in the next 1 week for further evaluation of your symptoms.   Return instructions:  SEEK IMMEDIATE MEDICAL ATTENTION IF YOU HAVE:  New numbness, tingling, weakness, or problem with the use of your arms or legs  Severe back pain not relieved with medications  Loss control of your bowels or bladder  Increasing pain in any areas of the body (such as chest or abdominal pain)  Shortness of breath, dizziness, or fainting.   Worsening nausea (feeling sick to your stomach), vomiting, fever, or sweats  Any other emergent concerns regarding your health   Additional Information:  Your vital signs today were: BP 133/86 mmHg   Pulse 92   Temp(Src) 98.3 F (36.8 C) (Oral)   Resp 16   Ht 5\' 6"  (1.676 m)   Wt 200 lb (90.719 kg)   BMI 32.30 kg/m2   SpO2 100% If your blood pressure (BP) was elevated above 135/85 this visit, please have this repeated by your doctor within one month. --------------

## 2014-12-11 ENCOUNTER — Encounter: Payer: Self-pay | Admitting: Physician Assistant

## 2014-12-13 ENCOUNTER — Ambulatory Visit (INDEPENDENT_AMBULATORY_CARE_PROVIDER_SITE_OTHER): Payer: 59 | Admitting: Physician Assistant

## 2014-12-13 VITALS — BP 100/78 | HR 95 | Temp 98.2°F | Resp 20 | Ht 66.5 in | Wt 202.2 lb

## 2014-12-13 DIAGNOSIS — F909 Attention-deficit hyperactivity disorder, unspecified type: Secondary | ICD-10-CM

## 2014-12-13 DIAGNOSIS — F988 Other specified behavioral and emotional disorders with onset usually occurring in childhood and adolescence: Secondary | ICD-10-CM

## 2014-12-13 DIAGNOSIS — F419 Anxiety disorder, unspecified: Secondary | ICD-10-CM

## 2014-12-13 DIAGNOSIS — G4700 Insomnia, unspecified: Secondary | ICD-10-CM

## 2014-12-13 DIAGNOSIS — G894 Chronic pain syndrome: Secondary | ICD-10-CM

## 2014-12-13 MED ORDER — AMPHETAMINE-DEXTROAMPHETAMINE 30 MG PO TABS: 15.0000 mg | ORAL_TABLET | Freq: Three times a day (TID) | ORAL | Status: DC

## 2014-12-13 MED ORDER — OXYCODONE HCL 20 MG PO TABS: 50.0000 mg | ORAL_TABLET | ORAL | Status: DC

## 2014-12-13 MED ORDER — ALPRAZOLAM 1 MG PO TABS: ORAL_TABLET | ORAL | Status: DC

## 2014-12-13 NOTE — Progress Notes (Signed)
Subjective:    Patient ID: [name], female    DOB: Nov 27, 1970, 44 y.o.   MRN: [id number]   PCP: Jleigh Striplin, PA-C  Chief Complaint  Patient presents with  . Medication Refill    Allergies  Allergen Reactions  . Imitrex [Sumatriptan Base] Shortness Of Breath and Other (See Comments)    TACHYCARDIA  . Trazodone And Nefazodone Other (See Comments)    NIGHTMARES  . Wellbutrin [Bupropion] Other (See Comments)    "crawling out of my skin"  . Effexor [Venlafaxine] Other (See Comments)    MAKES HER "JITTERY"  . Metoclopramide Hcl Itching  . Morphine And Related Itching  . Tegretol [Carbamazepine] Itching  . Topamax Itching    Patient Active Problem List   Diagnosis Date Noted  . Abdominal pain, right lower quadrant 07/01/2014  . IBS (irritable bowel syndrome) 06/03/2014  . Anxiety 03/09/2014  . Obesity (BMI 30-39.9) 10/27/2013  . Neck pain on left side 09/02/2013  . Cervical radiculopathy 08/18/2013  . Chronic pain disorder 08/16/2013  . Drug-seeking behavior 08/16/2013  . Muscle spasm of left shoulder area 08/16/2013  . Lumbar back pain 11/21/2012  . Esophageal stricture 11/21/2012  . ADD (attention deficit disorder) 11/21/2012  . Hypertriglyceridemia 11/21/2012  . Pain 08/06/2012  . Carpal tunnel syndrome, bilateral 07/05/2012  . OSA (obstructive sleep apnea) 06/28/2012  . Constipation 01/01/2012  . Migraine headache 03/02/2011  . Depression 03/02/2011  . History of substance abuse 03/02/2011  . Hypertension 03/02/2011  . Abdominal pain, epigastric 02/20/2011    Prior to Admission medications   Medication Sig Start Date End Date Taking? Authorizing Provider  acetaminophen (TYLENOL) 500 MG tablet Take 1,000 mg by mouth every 6 (six) hours as needed for moderate pain.   Yes Historical Provider, MD  ALPRAZolam Prudy Feeler) 1 MG tablet Take 1 tablet as needed Morning and mid-day; take 1-3 tablets at bedtime as needed. 11/23/14  Yes Yaziel Brandon S Regino Fournet, PA-C  amLODipine  (NORVASC) 10 MG tablet Take 1 tablet (10 mg total) by mouth at bedtime. 11/07/14  Yes Morrell Riddle, PA-C  amphetamine-dextroamphetamine (ADDERALL) 30 MG tablet Take 0.5 tablets by mouth 3 (three) times daily. 10/18/14  Yes Claramae Rigdon S Bertil Brickey, PA-C  ARIPiprazole (ABILIFY) 10 MG tablet Take 1 tablet (10 mg total) by mouth daily. 10/26/14 10/26/15 Yes Quanesha Klimaszewski S Peyton Rossner, PA-C  busPIRone (BUSPAR) 15 MG tablet Take 1 tablet (15 mg total) by mouth 2 (two) times daily. 08/06/14  Yes Verne Spurr, PA-C  cyclobenzaprine (FLEXERIL) 10 MG tablet Take 1 tablet (10 mg total) by mouth 2 (two) times daily as needed for muscle spasms. 12/06/14  Yes Renne Crigler, PA-C  EPINEPHrine (EPIPEN) 0.3 mg/0.3 mL IJ SOAJ injection Inject 0.3 mLs (0.3 mg total) into the muscle once as needed (Anaphylaxis). 07/01/14  Yes Brittainy Bucker S Zen Cedillos, PA-C  gabapentin (NEURONTIN) 300 MG capsule TAKE ONE CAPSULE BY MOUTH 3 TIMES A DAY 07/08/14  Yes Akiva Brassfield S Korde Jeppsen, PA-C  ibuprofen (ADVIL,MOTRIN) 200 MG tablet Take 600 mg by mouth 2 (two) times daily as needed for pain.   Yes Historical Provider, MD  lubiprostone (AMITIZA) 24 MCG capsule Take 1 capsule (24 mcg total) by mouth 2 (two) times daily with a meal. 07/01/14  Yes Louis Meckel, MD  Naloxegol Oxalate (MOVANTIK) 25 MG TABS Take 1 tablet by mouth daily. 07/15/14  Yes Louis Meckel, MD  ondansetron (ZOFRAN-ODT) 8 MG disintegrating tablet DISSOLVE 1 TABLET BY MOUTH EVERY 8 HOURS AS NEEDED FOR NAUSEA 08/07/14  Yes  Tamla Winkels S Tiffanye Hartmann, PA-C  Oxycodone HCl 20 MG TABS Take 2.5 tablets (50 mg total) by mouth every 4 (four) hours. 11/29/14  Yes Everett Ehrler S Beatris Belen, PA-C  pantoprazole (PROTONIX) 40 MG tablet Take 1 tablet (40 mg total) by mouth at bedtime. 06/03/14  Yes [name], PA-C  PROAIR HFA 108 (90 BASE) MCG/ACT inhaler  09/07/14  Yes Historical Provider, MD  sertraline (ZOLOFT) 100 MG tablet TAKE 1 AND 1/2 TABLETS BY MOUTH AT BEDTIME 10/14/14  Yes [name], PA-C    Medical, Surgical,  Family and Social History reviewed and updated.  HPI  Presents for prescription refills. She made two requests by My Chart, but when she didn't hear back, became worried that she'd run out of her meds.  ADD, Chronic pain, and insomnia are stable on her current regimen. She feels like they are working well and she denies adverse effects. She's been trying to get more active, and is eating healthfully. Has gained 2 pounds, which is frustrating to her. Looking into pool exercise.  She strained her back recently cleaning out the lizard cages at her home. She was seen in the ED, and is now back to baseline.  Review of Systems     Objective:   Physical Exam  Constitutional: She is oriented to person, place, and time. She appears well-developed and well-nourished. She is active and cooperative. No distress.  BP 100/78 mmHg  Pulse 95  Temp(Src) 98.2 F (36.8 C) (Oral)  Resp 20  Ht 5' 6.5" (1.689 m)  Wt 202 lb 4 oz (91.74 kg)  BMI 32.16 kg/m2  SpO2 97%   Eyes: Conjunctivae are normal.  Neck: Neck supple. No thyromegaly present.  Cardiovascular: Regular rhythm and normal heart sounds.   Pulmonary/Chest: Effort normal and breath sounds normal.  Lymphadenopathy:    She has no cervical adenopathy.  Neurological: She is alert and oriented to person, place, and time.  Skin: Skin is warm and dry.  Psychiatric: She has a normal mood and affect. Her speech is normal and behavior is normal.          Assessment & Plan:  1. Chronic pain disorder Stable. Continue current treatment. May call in 2 weeks for next fill. - Oxycodone HCl 20 MG TABS; Take 2.5 tablets (50 mg total) by mouth every 4 (four) hours.  Dispense: 210 tablet; Refill: 0  2. Insomnia 4. Anxiety Stable/controlled. Continue current treatment. - ALPRAZolam (XANAX) 1 MG tablet; Take 1 tablet as needed Morning and mid-day; take 1-3 tablets at bedtime as needed.  Dispense: 150 tablet; Refill: 0  3. ADD (attention deficit  disorder) Stable/controlled. Continue current treatment. - amphetamine-dextroamphetamine (ADDERALL) 30 MG tablet; Take 0.5 tablets by mouth 3 (three) times daily.  Dispense: 45 tablet; Refill: 0   [name], PA-C Physician Assistant-Certified [facility]

## 2014-12-24 ENCOUNTER — Encounter: Payer: Self-pay | Admitting: Physician Assistant

## 2014-12-24 DIAGNOSIS — G894 Chronic pain syndrome: Secondary | ICD-10-CM

## 2014-12-25 MED ORDER — OXYCODONE HCL 20 MG PO TABS: 50.0000 mg | ORAL_TABLET | ORAL | Status: DC

## 2014-12-25 NOTE — Telephone Encounter (Signed)
Patient notified via My Chart.  Rx printed.  Meds ordered this encounter  Medications  . Oxycodone HCl 20 MG TABS    Sig: Take 2.5 tablets (50 mg total) by mouth every 4 (four) hours.    Dispense:  210 tablet    Refill:  0    Order Specific Question:  Supervising Provider    Answer:  DOOLITTLE, ROBERT P [[id number]]

## 2015-01-01 ENCOUNTER — Other Ambulatory Visit: Payer: Self-pay | Admitting: Physician Assistant

## 2015-01-07 ENCOUNTER — Encounter: Payer: Self-pay | Admitting: Physician Assistant

## 2015-01-07 DIAGNOSIS — G894 Chronic pain syndrome: Secondary | ICD-10-CM

## 2015-01-07 DIAGNOSIS — G4700 Insomnia, unspecified: Secondary | ICD-10-CM

## 2015-01-07 DIAGNOSIS — F419 Anxiety disorder, unspecified: Secondary | ICD-10-CM

## 2015-01-08 MED ORDER — OXYCODONE HCL 20 MG PO TABS: 50.0000 mg | ORAL_TABLET | ORAL | Status: DC

## 2015-01-08 MED ORDER — ALPRAZOLAM 1 MG PO TABS: ORAL_TABLET | ORAL | Status: DC

## 2015-01-08 NOTE — Telephone Encounter (Signed)
Rx in drawer. 

## 2015-01-08 NOTE — Telephone Encounter (Signed)
Patient notified via My Chart.   Meds ordered this encounter  Medications  . ALPRAZolam (XANAX) 1 MG tablet    Sig: Take 1 tablet as needed Morning and mid-day; take 1-3 tablets at bedtime as needed.    Dispense:  150 tablet    Refill:  0    Order Specific Question:  Supervising Provider    Answer:  DOOLITTLE, ROBERT P [[id number]]  . Oxycodone HCl 20 MG TABS    Sig: Take 2.5 tablets (50 mg total) by mouth every 4 (four) hours.    Dispense:  210 tablet    Refill:  0    Order Specific Question:  Supervising Provider    Answer:  DOOLITTLE, ROBERT P [[id number]]

## 2015-01-19 ENCOUNTER — Encounter: Payer: Self-pay | Admitting: Physician Assistant

## 2015-01-20 ENCOUNTER — Other Ambulatory Visit: Payer: Self-pay

## 2015-01-20 ENCOUNTER — Encounter: Payer: Self-pay | Admitting: Physician Assistant

## 2015-01-20 DIAGNOSIS — G894 Chronic pain syndrome: Secondary | ICD-10-CM

## 2015-01-20 NOTE — Telephone Encounter (Signed)
Patient called to request a refill of Oxycodone HCl 20 MG TABS.  She said she typically sees [name].  Please advise.  Thank you.  CB#: [phone]

## 2015-01-21 ENCOUNTER — Ambulatory Visit (INDEPENDENT_AMBULATORY_CARE_PROVIDER_SITE_OTHER): Payer: 59 | Admitting: Family Medicine

## 2015-01-21 VITALS — BP 102/74 | HR 74 | Temp 97.4°F | Resp 12 | Ht 66.5 in | Wt 204.2 lb

## 2015-01-21 DIAGNOSIS — R110 Nausea: Secondary | ICD-10-CM | POA: Diagnosis not present

## 2015-01-21 DIAGNOSIS — J029 Acute pharyngitis, unspecified: Secondary | ICD-10-CM

## 2015-01-21 DIAGNOSIS — H60543 Acute eczematoid otitis externa, bilateral: Secondary | ICD-10-CM | POA: Diagnosis not present

## 2015-01-21 DIAGNOSIS — G894 Chronic pain syndrome: Secondary | ICD-10-CM

## 2015-01-21 DIAGNOSIS — F909 Attention-deficit hyperactivity disorder, unspecified type: Secondary | ICD-10-CM

## 2015-01-21 DIAGNOSIS — F419 Anxiety disorder, unspecified: Secondary | ICD-10-CM

## 2015-01-21 DIAGNOSIS — G4700 Insomnia, unspecified: Secondary | ICD-10-CM

## 2015-01-21 DIAGNOSIS — F988 Other specified behavioral and emotional disorders with onset usually occurring in childhood and adolescence: Secondary | ICD-10-CM

## 2015-01-21 MED ORDER — AMPHETAMINE-DEXTROAMPHETAMINE 30 MG PO TABS: 15.0000 mg | ORAL_TABLET | Freq: Three times a day (TID) | ORAL | Status: DC

## 2015-01-21 MED ORDER — ONDANSETRON 8 MG PO TBDP: ORAL_TABLET | ORAL | Status: DC

## 2015-01-21 MED ORDER — OXYCODONE HCL 20 MG PO TABS: 50.0000 mg | ORAL_TABLET | ORAL | Status: DC

## 2015-01-21 MED ORDER — ALPRAZOLAM 1 MG PO TABS: ORAL_TABLET | ORAL | Status: DC

## 2015-01-21 MED ORDER — CIPROFLOXACIN-DEXAMETHASONE 0.3-0.1 % OT SUSP: 4.0000 [drp] | Freq: Two times a day (BID) | OTIC | Status: DC

## 2015-01-21 NOTE — Telephone Encounter (Signed)
Patient called again about her med refill.

## 2015-01-21 NOTE — Progress Notes (Signed)
Subjective:   This chart was scribed for Nilda SimmerKristi Smith, MD by Jarvis Morganaylor Ferguson, ED Scribe. This patient was seen in Room 5 and the patient's care was started at 7:23 PM.   Patient ID: [name], female    DOB: October 16, 1971, 44 y.o.   MRN: [id number]  01/21/2015  Nausea and Medication Refill  HPI  HPI Comments: [name] is a 44 y.o. female who presents to [facility] complaining of nausea for 1 week. Pt states that her symptoms started as an acute illness with mouth lesions and sore throat. She notes that those symptoms began to resolve but she developed a headache. She has a h/o migraines. Pt states that this nausea is not associated with her migraines but due to the acute illness. Pt reports that her sore throat came back today. She is having associated chills, nasal congestion, rhinorrhea, ear drainage, and sinus pressure.  She regularly uses Cipro ear drops with relief of recurrent ear drainage but states she needs a refill at the time. She has chronic recurrent ear canal itching and flaking; the Cipro ear drops always work well for her. She has been taking Zofran and Ibuprofen with mild relief. She denies fever, cough, abdominal pain, dysuria, vomiting, or diarrhea.  She is also here for a medication refill. She saw Theora Gianottihelle Jeffrey, PA-C on 12/13/14 for f/u of her anxiety, chronic pain and ADHD and no changes were made for management. Pt gets her pain medication biweekly and she f/u with [name] on a monthly basis. Pt states that her back pain has remained stable. It has not worsened. She denies any radiation. She denies any numbness or tingling. She is also on Adderall and states it is working well. She denies any side effects. Pt takes Xanax and bedtime and 2 during the day. Pt states it manages her stress well.  Daughter manages medication for patient.  Pt admits she stopped smoking 1 year ago. She She is on BP medication but states her BP has been running low lately.  Denies  dizziness or syncope.   Review of Systems  Constitutional: Positive for chills and fatigue. Negative for fever and diaphoresis.  HENT: Positive for congestion, ear discharge (recurring; uses Cipro drops), rhinorrhea, sinus pressure and sore throat. Negative for ear pain, trouble swallowing and voice change.   Respiratory: Negative for cough and shortness of breath.   Cardiovascular: Negative for chest pain, palpitations and leg swelling.  Gastrointestinal: Positive for nausea. Negative for vomiting, abdominal pain and diarrhea.  Genitourinary: Negative for dysuria, urgency, frequency and hematuria.  Musculoskeletal: Positive for back pain (at baseline).  Skin: Negative for rash.  Neurological: Positive for headaches. Negative for dizziness, speech difficulty, weakness, light-headedness and numbness.  Psychiatric/Behavioral: Positive for sleep disturbance and decreased concentration. The patient is nervous/anxious (at baseline; managed by medication).     Past Medical History  Diagnosis Date  . Anxiety   . GERD (gastroesophageal reflux disease)   . History of alcohol abuse     no alcohol since 2004  . Migraine headache   . History of cocaine abuse     none since 2011, entered treatment  . History of kidney stones   . Arthritis     left knee  . Depression   . Complication of anesthesia 03/2011    history of aspiration after anesthesia  . History of esophageal dilatation     multiple  . History of pancreatitis 2012  . Hypertension  under control with med., has been on med. x 3 yr.  . Medial meniscus tear 07/2013    right  . Cervical muscle strain 07/2013    current Prednisone taper  . Dental crowns present     x 2 upper front  . Genital herpes simplex type 2   . Herniated disc, cervical   . Substance abuse   . Sleep apnea     not wearing c-pap   Past Surgical History  Procedure Laterality Date  . Abdominal hysterectomy      partial  . Cesarean section      x 2  .  Nasal septum surgery    . Carpal tunnel release  07/29/2012    Procedure: CARPAL TUNNEL RELEASE;  Surgeon: Eldred Manges, MD;  Location: [facility];  Service: Orthopedics;  Laterality: Right;  Right carpal tunnel release  . Laparoscopic appendectomy  02/21/2006  . Laparoscopic lysis of adhesions  02/21/2006  . Cholecystectomy  04/05/2010  . Ercp w/ metal stent placement  03/14/2011  . Cystoscopy w/ ureteral stent placement Right 06/11/2007    stent removed  . Nasal septoplasty w/ turbinoplasty Bilateral 07/26/2009    bilat. turb. reduction  . Knee arthroscopy Left     x 3  . Knee arthroscopy with medial menisectomy Right 07/31/2013    Procedure: KNEE ARTHROSCOPY WITH PARTIAL LATERAL MENISECTOMY, CHONDROPLASTY;  Surgeon: Loreta Ave, MD;  Location: [facility];  Service: Orthopedics;  Laterality: Right;  . Appendectomy    . Colonoscopy    . Ercp    . Upper gastrointestinal endoscopy      with dilatation   Allergies  Allergen Reactions  . Imitrex [Sumatriptan Base] Shortness Of Breath and Other (See Comments)    TACHYCARDIA  . Trazodone And Nefazodone Other (See Comments)    NIGHTMARES  . Wellbutrin [Bupropion] Other (See Comments)    "crawling out of my skin"  . Effexor [Venlafaxine] Other (See Comments)    MAKES HER "JITTERY"  . Metoclopramide Hcl Itching  . Morphine And Related Itching  . Tegretol [Carbamazepine] Itching  . Topamax Itching        Objective:    Triage Vitals: BP 102/74 mmHg  Pulse 74  Temp(Src) 97.4 F (36.3 C) (Oral)  Resp 12  Ht 5' 6.5" (1.689 m)  Wt 204 lb 4 oz (92.647 kg)  BMI 32.48 kg/m2  SpO2 100%  Physical Exam  Constitutional: She is oriented to person, place, and time. She appears well-developed and well-nourished. No distress.  HENT:  Head: Normocephalic and atraumatic.  Right Ear: External ear normal.  Left Ear: Tympanic membrane and external ear normal.  Mouth/Throat: Uvula is midline. Posterior oropharyngeal  erythema (slight) present.  Mild erythema B ear canals.  No swelling.  Eyes: Conjunctivae and EOM are normal. Pupils are equal, round, and reactive to light.  Neck: Neck supple. No tracheal deviation present. No thyromegaly present.  Cardiovascular: Normal rate, regular rhythm and normal heart sounds.  Exam reveals no gallop and no friction rub.   No murmur heard. Pulmonary/Chest: Effort normal and breath sounds normal. No respiratory distress. She has no wheezes. She has no rales.  Abdominal: Soft. Bowel sounds are normal. She exhibits no distension and no mass. There is no tenderness. There is no rebound and no guarding.  Musculoskeletal: Normal range of motion.  Lymphadenopathy:    She has cervical adenopathy (mild).  Neurological: She is alert and oriented to person, place, and time. No cranial  nerve deficit. She exhibits normal muscle tone. Coordination normal.  Skin: Skin is warm and dry. No rash noted. She is not diaphoretic.  Psychiatric: She has a normal mood and affect. Her behavior is normal. Judgment and thought content normal.  Flat affect.  Nursing note and vitals reviewed.       Assessment & Plan:   1. ADD (attention deficit disorder)   2. Chronic pain disorder   3. Insomnia   4. Anxiety   5. Sore throat   6. Nausea without vomiting   7. Otitis externa, acute eczematoid, bilateral     1. ADD: controlled; refill of Adderall provided during visit. 2.  Chronic pain disorder: controlled; rx for oxycodone provided. 3.  Anxiety; Stable; I DID NOT provide refill of Xanax during visit; pt reports that she does not need rx at this time. 4.  Sore throat: New. Consistent with viral syndrome; pt declined throat swab to rule out strep throat. 5.  Nausea: New. Onset in past five days with acute illness; benign abdominal exam; BRAT diet,hydration. Refill of Zofran provided. If nausea does not improve in upcoming week, RTC for labs and further evaluation.  Pt declined u/a and labs  during visit today. 6.  Otitis Externa Eczematous B: Recurrent; refill of Ciprodex provided; consider switching to topical steroid oil to ears.    Meds ordered this encounter  Medications  . ondansetron (ZOFRAN-ODT) 8 MG disintegrating tablet    Sig: DISSOLVE 1 TABLET BY MOUTH EVERY 8 HOURS AS NEEDED FOR NAUSEA    Dispense:  90 tablet    Refill:  1  . ciprofloxacin-dexamethasone (CIPRODEX) otic suspension    Sig: Place 4 drops into both ears 2 (two) times daily.    Dispense:  7.5 mL    Refill:  1  . amphetamine-dextroamphetamine (ADDERALL) 30 MG tablet    Sig: Take 0.5 tablets by mouth 3 (three) times daily.    Dispense:  45 tablet    Refill:  0  . Oxycodone HCl 20 MG TABS    Sig: Take 2.5 tablets (50 mg total) by mouth every 4 (four) hours.    Dispense:  210 tablet    Refill:  0  . ALPRAZolam (XANAX) 1 MG tablet    Sig: Take 1 tablet as needed Morning and mid-day; take 1-3 tablets at bedtime as needed.    Dispense:  150 tablet    Refill:  0    No Follow-up on file.  I personally performed the services described in this documentation, which was scribed in my presence. The recorded information has been reviewed and considered.  Kristi Paulita Fujita, M.D. [facility] [address] [place], [place]  [address] [phone] phone [phone] fax

## 2015-01-21 NOTE — Patient Instructions (Signed)
1.  Call in one week if nausea has not improved. 2.  Decrease Amlodipine 10mg  to 1/2 tablet daily.

## 2015-01-21 NOTE — Telephone Encounter (Signed)
At max dose, #210 should last 14 days, so she would be due tomorrow.

## 2015-01-22 ENCOUNTER — Telehealth: Payer: Self-pay

## 2015-01-22 DIAGNOSIS — H60543 Acute eczematoid otitis externa, bilateral: Secondary | ICD-10-CM

## 2015-01-22 MED ORDER — NEOMYCIN-POLYMYXIN-HC 3.5-10000-1 OT SOLN: 3.0000 [drp] | Freq: Four times a day (QID) | OTIC | Status: DC

## 2015-01-22 NOTE — Telephone Encounter (Signed)
Pharm called to ask if we can change the Ciprodex otic to something less expensive for pt. He stated that the lease expensive alternative that they have in stock would be generic cortosporin otic. I will send this to PA pool as well as Dr Katrinka BlazingSmith since she is not sch to be in office today.

## 2015-01-22 NOTE — Telephone Encounter (Signed)
Cortisporin is a secondary option for otitis externa.  I will prescribe.  We should consider a fungal etiology at future office visits.

## 2015-01-26 NOTE — Telephone Encounter (Signed)
Called to make sure pt was aware another Rx was sent in, and she was and has p/up.

## 2015-02-01 ENCOUNTER — Ambulatory Visit (INDEPENDENT_AMBULATORY_CARE_PROVIDER_SITE_OTHER): Payer: 59 | Admitting: Physician Assistant

## 2015-02-01 VITALS — BP 162/104 | HR 115 | Temp 98.5°F | Resp 18 | Ht 66.4 in | Wt 200.0 lb

## 2015-02-01 DIAGNOSIS — I10 Essential (primary) hypertension: Secondary | ICD-10-CM

## 2015-02-01 DIAGNOSIS — G894 Chronic pain syndrome: Secondary | ICD-10-CM | POA: Diagnosis not present

## 2015-02-01 DIAGNOSIS — G4700 Insomnia, unspecified: Secondary | ICD-10-CM

## 2015-02-01 DIAGNOSIS — F419 Anxiety disorder, unspecified: Secondary | ICD-10-CM

## 2015-02-01 MED ORDER — OXYCODONE HCL 20 MG PO TABS: 50.0000 mg | ORAL_TABLET | ORAL | Status: DC

## 2015-02-01 MED ORDER — ALPRAZOLAM 1 MG PO TABS: ORAL_TABLET | ORAL | Status: DC

## 2015-02-01 NOTE — Progress Notes (Signed)
   Subjective:    Patient ID: [name], female    DOB: [birth year], 44 y.o.   MRN: [id number]  HPI  Pt is here for her Rx - she knows she is a few days early but she is going tomorrow to be living with her grandmother - staying with her 24h at day and not planning on leaving the house while she is with her over the next bit of time and this is the only day that she could be seen.  She has medications left - she is planning on filling then tomorrow.  Last written -  Xanax - 3/4 (a 3 day supply) Oxy 3/17 (a 14 day supply)  Pts BP is elevated - at her last visit she was instructed to take only half of her Norvasc because her BP was low.  She was not having dizziness but she was fatigue and her partner thought it might be from her BP medications whereas the patient thought it was from her stress level which is really high.  Over the last 1.5 week she did not feel less fatigue but she has noticed a pounding in her head at time.  Review of Systems     Objective:   Physical Exam  Constitutional: She is oriented to person, place, and time. She appears well-developed and well-nourished.  BP 162/104 mmHg (this was verified with a 2nd manual BP check of 160/100)  Pulse 115  Temp(Src) 98.5 F (36.9 C) (Oral)  Resp 18  Ht 5' 6.4" (1.687 m)  Wt 200 lb (90.719 kg)  BMI 31.88 kg/m2  SpO2 99%   HENT:  Head: Normocephalic and atraumatic.  Right Ear: External ear normal.  Left Ear: External ear normal.  Pulmonary/Chest: Effort normal.  Neurological: She is alert and oriented to person, place, and time.  Skin: Skin is warm and dry.  Psychiatric: She has a normal mood and affect. Her behavior is normal. Judgment and thought content normal.       Assessment & Plan:  Chronic pain disorder - Plan: Oxycodone HCl 20 MG TABS  Insomnia - Plan: ALPRAZolam (XANAX) 1 MG tablet  Anxiety - Plan: ALPRAZolam (XANAX) 1 MG tablet   HTN - uncontrolled - increase her Norvasc back to 10mg  - even with her BP  low like it has been at her last visits she felt fine and I really think that her fatigue is related to her stress.  She will monitor her BP at home and let me know if she is having any problems.  I gave her her RX but we discussed that they will be written later next month.  Oxy will due in 17 days (on 4/14) and Xanax in 34 days (on 4/29).  Benny LennertSarah Weber PA-C  [facility] 02/01/2015 9:21 PM

## 2015-02-03 ENCOUNTER — Emergency Department (HOSPITAL_COMMUNITY)
Admission: EM | Admit: 2015-02-03 | Discharge: 2015-02-03 | Disposition: A | Discharge status: Home / Self Care | Payer: 59 | Attending: Emergency Medicine | Admitting: Emergency Medicine

## 2015-02-03 ENCOUNTER — Encounter (HOSPITAL_COMMUNITY): Payer: Self-pay | Admitting: *Deleted

## 2015-02-03 DIAGNOSIS — G43909 Migraine, unspecified, not intractable, without status migrainosus: Secondary | ICD-10-CM | POA: Diagnosis not present

## 2015-02-03 DIAGNOSIS — Z87891 Personal history of nicotine dependence: Secondary | ICD-10-CM | POA: Insufficient documentation

## 2015-02-03 DIAGNOSIS — F329 Major depressive disorder, single episode, unspecified: Secondary | ICD-10-CM | POA: Diagnosis not present

## 2015-02-03 DIAGNOSIS — T7840XA Allergy, unspecified, initial encounter: Secondary | ICD-10-CM | POA: Insufficient documentation

## 2015-02-03 DIAGNOSIS — R21 Rash and other nonspecific skin eruption: Secondary | ICD-10-CM | POA: Diagnosis present

## 2015-02-03 DIAGNOSIS — M179 Osteoarthritis of knee, unspecified: Secondary | ICD-10-CM | POA: Diagnosis not present

## 2015-02-03 DIAGNOSIS — Z792 Long term (current) use of antibiotics: Secondary | ICD-10-CM | POA: Insufficient documentation

## 2015-02-03 DIAGNOSIS — Z8619 Personal history of other infectious and parasitic diseases: Secondary | ICD-10-CM | POA: Diagnosis not present

## 2015-02-03 DIAGNOSIS — I10 Essential (primary) hypertension: Secondary | ICD-10-CM | POA: Diagnosis not present

## 2015-02-03 DIAGNOSIS — R220 Localized swelling, mass and lump, head: Secondary | ICD-10-CM | POA: Insufficient documentation

## 2015-02-03 DIAGNOSIS — Z79891 Long term (current) use of opiate analgesic: Secondary | ICD-10-CM | POA: Diagnosis not present

## 2015-02-03 DIAGNOSIS — F419 Anxiety disorder, unspecified: Secondary | ICD-10-CM | POA: Diagnosis not present

## 2015-02-03 DIAGNOSIS — Z87442 Personal history of urinary calculi: Secondary | ICD-10-CM | POA: Diagnosis not present

## 2015-02-03 DIAGNOSIS — M1990 Unspecified osteoarthritis, unspecified site: Secondary | ICD-10-CM | POA: Insufficient documentation

## 2015-02-03 DIAGNOSIS — K219 Gastro-esophageal reflux disease without esophagitis: Secondary | ICD-10-CM | POA: Insufficient documentation

## 2015-02-03 DIAGNOSIS — Z7952 Long term (current) use of systemic steroids: Secondary | ICD-10-CM | POA: Diagnosis not present

## 2015-02-03 DIAGNOSIS — Z79899 Other long term (current) drug therapy: Secondary | ICD-10-CM | POA: Insufficient documentation

## 2015-02-03 DIAGNOSIS — Z87828 Personal history of other (healed) physical injury and trauma: Secondary | ICD-10-CM | POA: Diagnosis not present

## 2015-02-03 MED ORDER — PREDNISONE 20 MG PO TABS: 40.0000 mg | ORAL_TABLET | Freq: Every day | ORAL | Status: DC

## 2015-02-03 MED ORDER — DIPHENHYDRAMINE HCL 50 MG/ML IJ SOLN
25.0000 mg | Freq: Once | INTRAMUSCULAR | Status: AC
  Administered 2015-02-03: 25 mg via INTRAVENOUS
  Filled 2015-02-03: qty 1

## 2015-02-03 MED ORDER — METHYLPREDNISOLONE SODIUM SUCC 125 MG IJ SOLR
125.0000 mg | Freq: Once | INTRAMUSCULAR | Status: AC
  Administered 2015-02-03: 125 mg via INTRAVENOUS
  Filled 2015-02-03: qty 2

## 2015-02-03 MED ORDER — FAMOTIDINE IN NACL 20-0.9 MG/50ML-% IV SOLN
20.0000 mg | Freq: Once | INTRAVENOUS | Status: AC
  Administered 2015-02-03: 20 mg via INTRAVENOUS
  Filled 2015-02-03: qty 50

## 2015-02-03 MED ORDER — SODIUM CHLORIDE 0.9 % IV BOLUS (SEPSIS)
1000.0000 mL | Freq: Once | INTRAVENOUS | Status: AC
  Administered 2015-02-03: 1000 mL via INTRAVENOUS

## 2015-02-03 NOTE — ED Provider Notes (Signed)
CSN: [id number]     Arrival date & time 02/03/15  1002 History   First MD Initiated Contact with Patient 02/03/15 1031     Chief Complaint  Patient presents with  . Allergic Reaction     (Consider location/radiation/quality/duration/timing/severity/associated sxs/prior Treatment) HPI Comments: Patient presents to the emergency department with a chief complaint of allergic reaction. Patient states that she began to have throat swelling and diffuse rash this morning. She took 50 mg of Benadryl with good relief. She states that she still feels some tightness in her throat and feels itchy all over. She has had symptoms similar to this in the past. She reports that she was nearly intubated the last time this happened, but ended up with symptoms resolving.  She denies any new contacts to allergens, denies unusual oral intake, denies any changes, in soaps, detergents.  She has never had allergy testing done.    Patient is a 44 y.o. female presenting with allergic reaction. The history is provided by the patient. No language interpreter was used.  Allergic Reaction Presenting symptoms: rash     Past Medical History  Diagnosis Date  . Anxiety   . GERD (gastroesophageal reflux disease)   . History of alcohol abuse     no alcohol since 2004  . Migraine headache   . History of cocaine abuse     none since 2011, entered treatment  . History of kidney stones   . Arthritis     left knee  . Depression   . Complication of anesthesia 03/2011    history of aspiration after anesthesia  . History of esophageal dilatation     multiple  . History of pancreatitis 2012  . Hypertension     under control with med., has been on med. x 3 yr.  . Medial meniscus tear 07/2013    right  . Cervical muscle strain 07/2013    current Prednisone taper  . Dental crowns present     x 2 upper front  . Genital herpes simplex type 2   . Herniated disc, cervical   . Substance abuse   . Sleep apnea     not wearing  c-pap   Past Surgical History  Procedure Laterality Date  . Abdominal hysterectomy      partial  . Cesarean section      x 2  . Nasal septum surgery    . Carpal tunnel release  07/29/2012    Procedure: CARPAL TUNNEL RELEASE;  Surgeon: Eldred Manges, MD;  Location: [facility];  Service: Orthopedics;  Laterality: Right;  Right carpal tunnel release  . Laparoscopic appendectomy  02/21/2006  . Laparoscopic lysis of adhesions  02/21/2006  . Cholecystectomy  04/05/2010  . Ercp w/ metal stent placement  03/14/2011  . Cystoscopy w/ ureteral stent placement Right 06/11/2007    stent removed  . Nasal septoplasty w/ turbinoplasty Bilateral 07/26/2009    bilat. turb. reduction  . Knee arthroscopy Left     x 3  . Knee arthroscopy with medial menisectomy Right 07/31/2013    Procedure: KNEE ARTHROSCOPY WITH PARTIAL LATERAL MENISECTOMY, CHONDROPLASTY;  Surgeon: Loreta Ave, MD;  Location: [facility];  Service: Orthopedics;  Laterality: Right;  . Appendectomy    . Colonoscopy    . Ercp    . Upper gastrointestinal endoscopy      with dilatation   Family History  Problem Relation Age of Onset  . Heart disease Maternal Uncle   .  Heart disease Maternal Grandmother   . Kidney cancer Mother   . Hypertension Mother   . Cancer Mother     renal  . Alcohol abuse Mother   . Lung cancer Maternal Aunt   . Stroke Maternal Uncle 55  . Alcohol abuse Maternal Uncle   . Colon cancer Neg Hx   . Prostate cancer Neg Hx   . Esophageal cancer Neg Hx   . Pancreatic cancer Neg Hx   . Rectal cancer Neg Hx   . Stomach cancer Neg Hx    History  Substance Use Topics  . Smoking status: Former Smoker    Quit date: 06/05/2013  . Smokeless tobacco: Never Used  . Alcohol Use: No     Comment: none since 2004   OB History    No data available     Review of Systems  Constitutional: Negative for fever and chills.  HENT:       Throat tightness  Respiratory: Negative for shortness of  breath.   Cardiovascular: Negative for chest pain.  Gastrointestinal: Negative for nausea, vomiting, diarrhea and constipation.  Genitourinary: Negative for dysuria.  Skin: Positive for rash.  All other systems reviewed and are negative.     Allergies  Imitrex; Trazodone and nefazodone; Wellbutrin; Effexor; Metoclopramide hcl; Morphine and related; Tegretol; and Topamax  Home Medications   Prior to Admission medications   Medication Sig Start Date End Date Taking? Authorizing Provider  acetaminophen (TYLENOL) 500 MG tablet Take 1,000 mg by mouth every 6 (six) hours as needed for moderate pain.    Historical Provider, MD  ALPRAZolam Prudy Feeler) 1 MG tablet Take 1 tablet as needed Morning and mid-day; take 3 tablets at bedtime as needed. 02/01/15   Morrell Riddle, PA-C  amLODipine (NORVASC) 10 MG tablet Take 1 tablet (10 mg total) by mouth at bedtime. 11/07/14   Morrell Riddle, PA-C  amphetamine-dextroamphetamine (ADDERALL) 30 MG tablet Take 0.5 tablets by mouth 3 (three) times daily. 01/21/15   [name], MD  ARIPiprazole (ABILIFY) 10 MG tablet TAKE 1 TABLET BY MOUTH DAILY. 01/03/15   [name] S Jeffery, PA-C  busPIRone (BUSPAR) 15 MG tablet Take 1 tablet (15 mg total) by mouth 2 (two) times daily. 08/06/14   Tamala Julian, PA-C  ciprofloxacin-dexamethasone (CIPRODEX) otic suspension Place 4 drops into both ears 2 (two) times daily. 01/21/15   [name], MD  cyclobenzaprine (FLEXERIL) 10 MG tablet Take 1 tablet (10 mg total) by mouth 2 (two) times daily as needed for muscle spasms. 12/06/14   Renne Crigler, PA-C  EPINEPHrine (EPIPEN) 0.3 mg/0.3 mL IJ SOAJ injection Inject 0.3 mLs (0.3 mg total) into the muscle once as needed (Anaphylaxis). 07/01/14   [name] S Jeffery, PA-C  gabapentin (NEURONTIN) 300 MG capsule TAKE ONE CAPSULE BY MOUTH 3 TIMES A DAY 07/08/14   [name] S Jeffery, PA-C  ibuprofen (ADVIL,MOTRIN) 200 MG tablet Take 600 mg by mouth 2 (two) times daily as needed for pain.    Historical  Provider, MD  lubiprostone (AMITIZA) 24 MCG capsule Take 1 capsule (24 mcg total) by mouth 2 (two) times daily with a meal. 07/01/14   Louis Meckel, MD  Naloxegol Oxalate (MOVANTIK) 25 MG TABS Take 1 tablet by mouth daily. 07/15/14   Louis Meckel, MD  neomycin-polymyxin-hydrocortisone (CORTISPORIN) otic solution Place 3 drops into both ears 4 (four) times daily. 01/22/15   Ofilia Neas, PA-C  ondansetron (ZOFRAN-ODT) 8 MG disintegrating tablet DISSOLVE 1 TABLET BY MOUTH  EVERY 8 HOURS AS NEEDED FOR NAUSEA 01/21/15   Ethelda ChickKristi M Smith, MD  Oxycodone HCl 20 MG TABS Take 2.5 tablets (50 mg total) by mouth every 4 (four) hours. 02/01/15   Morrell RiddleSarah L Weber, PA-C  pantoprazole (PROTONIX) 40 MG tablet Take 1 tablet (40 mg total) by mouth at bedtime. 06/03/14   [name] Tessa LernerS Jeffery, PA-C  PROAIR HFA 108 (90 BASE) MCG/ACT inhaler  09/07/14   Historical Provider, MD  sertraline (ZOLOFT) 100 MG tablet TAKE 1 AND 1/2 TABLETS BY MOUTH AT BEDTIME 10/14/14   [name] S Jeffery, PA-C   BP 129/99 mmHg  Pulse 102  Temp(Src) 98.3 F (36.8 C) (Oral)  Resp 18  Ht 5\' 6"  (1.676 m)  Wt 200 lb (90.719 kg)  BMI 32.30 kg/m2  SpO2 97% Physical Exam  Constitutional: She is oriented to person, place, and time. She appears well-developed and well-nourished.  HENT:  Head: Normocephalic and atraumatic.  Oropharynx is clear, mild swelling of the uvula, no abscess, no stridor, airway intact  Eyes: Conjunctivae and EOM are normal. Pupils are equal, round, and reactive to light.  Neck: Normal range of motion. Neck supple.  Cardiovascular: Normal rate and regular rhythm.  Exam reveals no gallop and no friction rub.   No murmur heard. Pulmonary/Chest: Effort normal and breath sounds normal. No stridor. No respiratory distress. She has no wheezes. She has no rales. She exhibits no tenderness.  CTAB  Abdominal: Soft. Bowel sounds are normal. She exhibits no distension and no mass. There is no tenderness. There is no rebound and no  guarding.  Musculoskeletal: Normal range of motion. She exhibits no edema or tenderness.  Neurological: She is alert and oriented to person, place, and time.  Skin: Skin is warm and dry.  Fine macular rash on extremities, flushing of face  Psychiatric: She has a normal mood and affect. Her behavior is normal. Judgment and thought content normal.  Nursing note and vitals reviewed.   ED Course  Procedures (including critical care time) Labs Review Labs Reviewed - No data to display  Imaging Review No results found.   EKG Interpretation None      MDM   Final diagnoses:  Allergic reaction, initial encounter    Patient with hx of unknown allergic reaction presents with symptoms of throat swelling and rash.  No known cause.  Good relief with benadryl.  Will give pepcid, solumedrol, and 25 of benadryl.  Patient seen by and discussed with Dr. Estell HarpinZammit.  Airway intact.  Don't think that epi is needed at this time.  No secondary symptoms.  12:04 PM Patient reassessed, she is feeling much better, airways intact, rashes resolved, no wheezing or stridor. She is requesting to be discharged. Patient's spouse is an Charity fundraiserN that works in this emergency department, I feel that it is appropriate to discharge the patient, she will have close supervision all day. Return precautions given. Patient and family members understand and agree with the plan. She is stable and ready for discharge.  12:47 PM Reassessed; still feels well.  DC to home.  Tx with benadryl and prednisone.  Recommend allergist follow-up.  Roxy Horsemanobert Habeeb Puertas, PA-C 02/03/15 1247  Bethann BerkshireJoseph Zammit, MD 02/03/15 [phone]

## 2015-02-03 NOTE — ED Notes (Signed)
Pt reports feeling like her throat is swelling since 0600. Also has rash to arms and redness to face. Pt took benadryl pta @ 8am. Airway intact at triage.

## 2015-02-03 NOTE — ED Notes (Addendum)
Pt taken back to room with family in tow; pt needed to use the bathroom; instructed pt to provide an urine specimen; family at bedside; Nehemiah SettleBrooke, RN aware of pt being in bathroom at this time; 2 warm blankets given

## 2015-02-03 NOTE — Discharge Instructions (Signed)
Continue benadryl for the next 2-3 days.  Take prednisone as directed.  Return for worsening symptoms.   Allergies Allergies may happen from anything your body is sensitive to. This may be food, medicines, pollens, chemicals, and nearly anything around you in everyday life that produces allergens. An allergen is anything that causes an allergy producing substance. Heredity is often a factor in causing these problems. This means you may have some of the same allergies as your parents. Food allergies happen in all age groups. Food allergies are some of the most severe and life threatening. Some common food allergies are cow's milk, seafood, eggs, nuts, wheat, and soybeans. SYMPTOMS   Swelling around the mouth.  An itchy red rash or hives.  Vomiting or diarrhea.  Difficulty breathing. SEVERE ALLERGIC REACTIONS ARE LIFE-THREATENING. This reaction is called anaphylaxis. It can cause the mouth and throat to swell and cause difficulty with breathing and swallowing. In severe reactions only a trace amount of food (for example, peanut oil in a salad) may cause death within seconds. Seasonal allergies occur in all age groups. These are seasonal because they usually occur during the same season every year. They may be a reaction to molds, grass pollens, or tree pollens. Other causes of problems are house dust mite allergens, pet dander, and mold spores. The symptoms often consist of nasal congestion, a runny itchy nose associated with sneezing, and tearing itchy eyes. There is often an associated itching of the mouth and ears. The problems happen when you come in contact with pollens and other allergens. Allergens are the particles in the air that the body reacts to with an allergic reaction. This causes you to release allergic antibodies. Through a chain of events, these eventually cause you to release histamine into the blood stream. Although it is meant to be protective to the body, it is this release that  causes your discomfort. This is why you were given anti-histamines to feel better. If you are unable to pinpoint the offending allergen, it may be determined by skin or blood testing. Allergies cannot be cured but can be controlled with medicine. Hay fever is a collection of all or some of the seasonal allergy problems. It may often be treated with simple over-the-counter medicine such as diphenhydramine. Take medicine as directed. Do not drink alcohol or drive while taking this medicine. Check with your caregiver or package insert for child dosages. If these medicines are not effective, there are many new medicines your caregiver can prescribe. Stronger medicine such as nasal spray, eye drops, and corticosteroids may be used if the first things you try do not work well. Other treatments such as immunotherapy or desensitizing injections can be used if all else fails. Follow up with your caregiver if problems continue. These seasonal allergies are usually not life threatening. They are generally more of a nuisance that can often be handled using medicine. HOME CARE INSTRUCTIONS   If unsure what causes a reaction, keep a diary of foods eaten and symptoms that follow. Avoid foods that cause reactions.  If hives or rash are present:  Take medicine as directed.  You may use an over-the-counter antihistamine (diphenhydramine) for hives and itching as needed.  Apply cold compresses (cloths) to the skin or take baths in cool water. Avoid hot baths or showers. Heat will make a rash and itching worse.  If you are severely allergic:  Following a treatment for a severe reaction, hospitalization is often required for closer follow-up.  Wear a medic-alert  bracelet or necklace stating the allergy.  You and your family must learn how to give adrenaline or use an anaphylaxis kit.  If you have had a severe reaction, always carry your anaphylaxis kit or EpiPen with you. Use this medicine as directed by your  caregiver if a severe reaction is occurring. Failure to do so could have a fatal outcome. SEEK MEDICAL CARE IF:  You suspect a food allergy. Symptoms generally happen within 30 minutes of eating a food.  Your symptoms have not gone away within 2 days or are getting worse.  You develop new symptoms.  You want to retest yourself or your child with a food or drink you think causes an allergic reaction. Never do this if an anaphylactic reaction to that food or drink has happened before. Only do this under the care of a caregiver. SEEK IMMEDIATE MEDICAL CARE IF:   You have difficulty breathing, are wheezing, or have a tight feeling in your chest or throat.  You have a swollen mouth, or you have hives, swelling, or itching all over your body.  You have had a severe reaction that has responded to your anaphylaxis kit or an EpiPen. These reactions may return when the medicine has worn off. These reactions should be considered life threatening. MAKE SURE YOU:   Understand these instructions.  Will watch your condition.  Will get help right away if you are not doing well or get worse. Document Released: 01/16/2003 Document Revised: 02/17/2013 Document Reviewed: 06/22/2008 [employer] Patient Information 2015 [place], [place]. This information is not intended to replace advice given to you by your health care provider. Make sure you discuss any questions you have with your health care provider.

## 2015-02-12 ENCOUNTER — Other Ambulatory Visit: Payer: Self-pay

## 2015-02-12 MED ORDER — VALACYCLOVIR HCL 1 G PO TABS: 1000.0000 mg | ORAL_TABLET | Freq: Every day | ORAL | Status: DC

## 2015-02-12 NOTE — Telephone Encounter (Signed)
Pharm reqs RFs of valacyclovir. [name], I don't see this discussed since 06/2013, but you have seen pt recently and regularly for check ups. Can we RF for 1 yr?

## 2015-02-16 ENCOUNTER — Ambulatory Visit (INDEPENDENT_AMBULATORY_CARE_PROVIDER_SITE_OTHER): Payer: 59 | Admitting: Physician Assistant

## 2015-02-16 VITALS — BP 136/85 | HR 109 | Temp 98.1°F | Resp 17 | Ht 66.25 in | Wt 203.2 lb

## 2015-02-16 DIAGNOSIS — G894 Chronic pain syndrome: Secondary | ICD-10-CM | POA: Diagnosis not present

## 2015-02-16 DIAGNOSIS — I10 Essential (primary) hypertension: Secondary | ICD-10-CM | POA: Diagnosis not present

## 2015-02-16 MED ORDER — OXYCODONE HCL 20 MG PO TABS: 50.0000 mg | ORAL_TABLET | ORAL | Status: DC

## 2015-02-16 NOTE — Progress Notes (Signed)
Subjective:    Patient ID: [name], female    DOB: [birth year], 44 y.o.   MRN: [id number]  HPI Pt presents to clinic for her Oxycodone refill.  She feels much better since restarting the Norvasc at the full dose she was on.  Review of Systems     Objective:   Physical Exam  Constitutional: She is oriented to person, place, and time. She appears well-developed and well-nourished.  BP 136/85 mmHg  Pulse 109  Temp(Src) 98.1 F (36.7 C) (Oral)  Resp 17  Ht 5' 6.25" (1.683 m)  Wt 203 lb 3.2 oz (92.171 kg)  BMI 32.54 kg/m2  SpO2 93%   HENT:  Head: Normocephalic and atraumatic.  Right Ear: External ear normal.  Left Ear: External ear normal.  Pulmonary/Chest: Effort normal.  Neurological: She is alert and oriented to person, place, and time.  Skin: Skin is warm and dry.  Psychiatric: She has a normal mood and affect. Her behavior is normal. Judgment and thought content normal.       Assessment & Plan:  Chronic pain disorder - Plan: Oxycodone HCl 20 MG TABS  A 2 week supply to be filled 4/14 - to be due on 4/28.  HTN - better control on Norvasc 10 mg qd - continue the dose   Benny Lennert PA-C  [facility] 02/16/2015 9:51 PM

## 2015-03-03 ENCOUNTER — Encounter: Payer: Self-pay | Admitting: Physician Assistant

## 2015-03-03 DIAGNOSIS — G894 Chronic pain syndrome: Secondary | ICD-10-CM

## 2015-03-03 DIAGNOSIS — G4700 Insomnia, unspecified: Secondary | ICD-10-CM

## 2015-03-03 DIAGNOSIS — F419 Anxiety disorder, unspecified: Secondary | ICD-10-CM

## 2015-03-04 ENCOUNTER — Ambulatory Visit (INDEPENDENT_AMBULATORY_CARE_PROVIDER_SITE_OTHER): Payer: 59 | Admitting: Family Medicine

## 2015-03-04 VITALS — BP 110/70 | HR 106 | Temp 98.4°F | Resp 16 | Ht 66.25 in | Wt 200.0 lb

## 2015-03-04 DIAGNOSIS — G4700 Insomnia, unspecified: Secondary | ICD-10-CM | POA: Diagnosis not present

## 2015-03-04 DIAGNOSIS — G894 Chronic pain syndrome: Secondary | ICD-10-CM | POA: Diagnosis not present

## 2015-03-04 DIAGNOSIS — F419 Anxiety disorder, unspecified: Secondary | ICD-10-CM | POA: Diagnosis not present

## 2015-03-04 DIAGNOSIS — R221 Localized swelling, mass and lump, neck: Secondary | ICD-10-CM

## 2015-03-04 MED ORDER — OXYCODONE HCL 20 MG PO TABS: 50.0000 mg | ORAL_TABLET | ORAL | Status: DC

## 2015-03-04 MED ORDER — ALPRAZOLAM 1 MG PO TABS: ORAL_TABLET | ORAL | Status: DC

## 2015-03-04 NOTE — Progress Notes (Signed)
Subjective:    Patient ID: [name], female    DOB: [birth year], 44 y.o.   MRN: [id number]  03/04/2015  Medication Refill   HPI This 44 y.o. female presents for two week follow-up:   1. Anxiety disorder:  Xanax: taking 5 per day.  Last filled 02/01/15.  Maintained on Abilify 10mg  daily, Adderall 30mg  tid.  Last follow-up with [name] on 12/10/14.  Emotionally stable at this time. No recent emotional issues.  Has seen Benny LennertSarah Weber, PA-C twice in past six weeks.  2.  Chronic pain syndrome: suffers with chronic knee/lower back/B wrist/B elbow, B ankle pain.  Took suboxone for two years; discontinued in 07/2012.  Maintained on Oxycodone last filled 02/16/15.  #210.  Taking 12 tablets per day.  Pain well controlled at this time; no recent pain issues.    3.  Throat swelling:  Evaluated in ED 02-03-15 for throat swelling of unknown eltiology.  Single episode last month yet chronic issue; might happen three times per year.  No food allergies.  No seasonal allergies.  No specific trigger.  Prescribed Epipen but reluctant to use. Interested in allergy consultation.  Review of Systems  Constitutional: Negative for fever, chills, diaphoresis and fatigue.  HENT: Negative for congestion, facial swelling, sneezing, trouble swallowing and voice change.   Eyes: Negative for visual disturbance.  Respiratory: Negative for cough and shortness of breath.   Cardiovascular: Negative for chest pain, palpitations and leg swelling.  Gastrointestinal: Negative for nausea, vomiting, abdominal pain, diarrhea and constipation.  Endocrine: Negative for cold intolerance, heat intolerance, polydipsia, polyphagia and polyuria.  Musculoskeletal: Positive for myalgias, back pain and arthralgias.  Skin: Negative for rash.  Neurological: Negative for dizziness, tremors, seizures, syncope, facial asymmetry, speech difficulty, weakness, light-headedness, numbness and headaches.  Psychiatric/Behavioral: Negative for suicidal ideas,  sleep disturbance, self-injury and dysphoric mood. The patient is nervous/anxious.     Past Medical History  Diagnosis Date  . Anxiety   . GERD (gastroesophageal reflux disease)   . History of alcohol abuse     no alcohol since 2004  . Migraine headache   . History of cocaine abuse     none since 2011, entered treatment  . History of kidney stones   . Arthritis     left knee  . Depression   . Complication of anesthesia 03/2011    history of aspiration after anesthesia  . History of esophageal dilatation     multiple  . History of pancreatitis 2012  . Hypertension     under control with med., has been on med. x 3 yr.  . Medial meniscus tear 07/2013    right  . Cervical muscle strain 07/2013    current Prednisone taper  . Dental crowns present     x 2 upper front  . Genital herpes simplex type 2   . Herniated disc, cervical   . Substance abuse   . Sleep apnea     not wearing c-pap   Past Surgical History  Procedure Laterality Date  . Abdominal hysterectomy      partial  . Cesarean section      x 2  . Nasal septum surgery    . Carpal tunnel release  07/29/2012    Procedure: CARPAL TUNNEL RELEASE;  Surgeon: Eldred MangesMark C Yates, MD;  Location: [facility];  Service: Orthopedics;  Laterality: Right;  Right carpal tunnel release  . Laparoscopic appendectomy  02/21/2006  . Laparoscopic lysis of adhesions  02/21/2006  .  Cholecystectomy  04/05/2010  . Ercp w/ metal stent placement  03/14/2011  . Cystoscopy w/ ureteral stent placement Right 06/11/2007    stent removed  . Nasal septoplasty w/ turbinoplasty Bilateral 07/26/2009    bilat. turb. reduction  . Knee arthroscopy Left     x 3  . Knee arthroscopy with medial menisectomy Right 07/31/2013    Procedure: KNEE ARTHROSCOPY WITH PARTIAL LATERAL MENISECTOMY, CHONDROPLASTY;  Surgeon: Loreta Ave, MD;  Location: [facility];  Service: Orthopedics;  Laterality: Right;  . Appendectomy    . Colonoscopy    . Ercp     . Upper gastrointestinal endoscopy      with dilatation   Allergies  Allergen Reactions  . Imitrex [Sumatriptan Base] Shortness Of Breath and Other (See Comments)    TACHYCARDIA  . Trazodone And Nefazodone Other (See Comments)    NIGHTMARES  . Wellbutrin [Bupropion] Other (See Comments)    "crawling out of my skin"  . Effexor [Venlafaxine] Other (See Comments)    MAKES HER "JITTERY"  . Metoclopramide Hcl Itching  . Morphine And Related Itching  . Tegretol [Carbamazepine] Itching  . Topamax Itching   Current Outpatient Prescriptions  Medication Sig Dispense Refill  . neomycin-polymyxin-hydrocortisone (CORTISPORIN) otic solution Place 3 drops into both ears 4 (four) times daily. 10 mL 0  . acetaminophen (TYLENOL) 500 MG tablet Take 1,000 mg by mouth every 6 (six) hours as needed for moderate pain.    Marland Kitchen ALPRAZolam (XANAX) 1 MG tablet Take 1 tablet as needed Morning and mid-day; take 3 tablets at bedtime as needed. 150 tablet 0  . amLODipine (NORVASC) 10 MG tablet Take 1 tablet (10 mg total) by mouth at bedtime. 90 tablet 1  . amphetamine-dextroamphetamine (ADDERALL) 30 MG tablet Take 0.5 tablets by mouth 3 (three) times daily. 45 tablet 0  . ARIPiprazole (ABILIFY) 10 MG tablet TAKE 1 TABLET BY MOUTH DAILY. 30 tablet 5  . busPIRone (BUSPAR) 15 MG tablet Take 1 tablet (15 mg total) by mouth 2 (two) times daily. (Patient not taking: Reported on 02/16/2015) 60 tablet 0  . cyclobenzaprine (FLEXERIL) 10 MG tablet Take 1 tablet (10 mg total) by mouth 2 (two) times daily as needed for muscle spasms. 14 tablet 0  . EPINEPHrine (EPIPEN) 0.3 mg/0.3 mL IJ SOAJ injection Inject 0.3 mLs (0.3 mg total) into the muscle once as needed (Anaphylaxis). 1 Device 2  . gabapentin (NEURONTIN) 300 MG capsule TAKE ONE CAPSULE BY MOUTH 3 TIMES A DAY (Patient not taking: Reported on 02/16/2015) 90 capsule 5  . ibuprofen (ADVIL,MOTRIN) 200 MG tablet Take 600 mg by mouth 2 (two) times daily as needed for pain.    Marland Kitchen  lubiprostone (AMITIZA) 24 MCG capsule Take 1 capsule (24 mcg total) by mouth 2 (two) times daily with a meal. 60 capsule 3  . Naloxegol Oxalate (MOVANTIK) 25 MG TABS Take 1 tablet by mouth daily. (Patient not taking: Reported on 02/16/2015) 30 tablet 1  . ondansetron (ZOFRAN-ODT) 8 MG disintegrating tablet DISSOLVE 1 TABLET BY MOUTH EVERY 8 HOURS AS NEEDED FOR NAUSEA 90 tablet 1  . Oxycodone HCl 20 MG TABS Take 2.5 tablets (50 mg total) by mouth every 4 (four) hours. 210 tablet 0  . pantoprazole (PROTONIX) 40 MG tablet Take 1 tablet (40 mg total) by mouth at bedtime. 90 tablet 3  . PROAIR HFA 108 (90 BASE) MCG/ACT inhaler   1  . sertraline (ZOLOFT) 100 MG tablet TAKE 1 AND 1/2 TABLETS BY MOUTH AT  BEDTIME 45 tablet 5  . valACYclovir (VALTREX) 1000 MG tablet Take 1 tablet (1,000 mg total) by mouth daily. 90 tablet 3   No current facility-administered medications for this visit.       Objective:    BP 110/70 mmHg  Pulse 106  Temp(Src) 98.4 F (36.9 C) (Oral)  Resp 16  Ht 5' 6.25" (1.683 m)  Wt 200 lb (90.719 kg)  BMI 32.03 kg/m2  SpO2 98% Physical Exam  Constitutional: She is oriented to person, place, and time. She appears well-developed and well-nourished. No distress.  HENT:  Head: Normocephalic and atraumatic.  Right Ear: External ear normal.  Left Ear: External ear normal.  Nose: Nose normal.  Mouth/Throat: Oropharynx is clear and moist.  Eyes: Conjunctivae and EOM are normal. Pupils are equal, round, and reactive to light.  Neck: Normal range of motion. Neck supple. Carotid bruit is not present. No thyromegaly present.  Cardiovascular: Normal rate, regular rhythm, normal heart sounds and intact distal pulses.  Exam reveals no gallop and no friction rub.   No murmur heard. Pulmonary/Chest: Effort normal and breath sounds normal. She has no wheezes. She has no rales.  Lymphadenopathy:    She has no cervical adenopathy.  Neurological: She is alert and oriented to person, place,  and time. No cranial nerve deficit.  Skin: Skin is warm and dry. No rash noted. She is not diaphoretic. No erythema. No pallor.  Psychiatric: She has a normal mood and affect. Her behavior is normal.        Assessment & Plan:   1. Chronic pain disorder   2. Anxiety   3. Throat swelling   4. Insomnia      1. Chronic pain syndrome: stable; refill of oxycodone provided today. 2.  Anxiety: controlled; refill of Xanax provided today. 3.  Insomnia: controlled on Xanax. 4.  Throat swelling:  Recurrent; refer to allergist for allergy testing.  Has Epipen.    Meds ordered this encounter  Medications  . ALPRAZolam (XANAX) 1 MG tablet    Sig: Take 1 tablet as needed Morning and mid-day; take 3 tablets at bedtime as needed.    Dispense:  150 tablet    Refill:  0  . Oxycodone HCl 20 MG TABS    Sig: Take 2.5 tablets (50 mg total) by mouth every 4 (four) hours.    Dispense:  210 tablet    Refill:  0    Ok to fill 02/18/2015    No Follow-up on file.    Kristi Paulita Fujita, M.D. [facility] [address] [place], [place]  [address] [phone] phone [phone] fax

## 2015-03-05 MED ORDER — ALPRAZOLAM 1 MG PO TABS: ORAL_TABLET | ORAL | Status: DC

## 2015-03-05 MED ORDER — OXYCODONE HCL 20 MG PO TABS: 50.0000 mg | ORAL_TABLET | ORAL | Status: DC

## 2015-03-05 NOTE — Telephone Encounter (Signed)
Patient notified via My Chart.  Meds ordered this encounter  Medications  . ALPRAZolam (XANAX) 1 MG tablet    Sig: Take 1 tablet as needed Morning and mid-day; take 3 tablets at bedtime as needed.    Dispense:  150 tablet    Refill:  0    Order Specific Question:  Supervising Provider    Answer:  DOOLITTLE, ROBERT P [[id number]]  . Oxycodone HCl 20 MG TABS    Sig: Take 2.5 tablets (50 mg total) by mouth every 4 (four) hours.    Dispense:  210 tablet    Refill:  0    Ok to fill 02/18/2015    Order Specific Question:  Supervising Provider    Answer:  Ellamae SiaOLITTLE, ROBERT P [[id number]]

## 2015-03-05 NOTE — Telephone Encounter (Signed)
Notified pt that rx's are ready. She states she came in last night and saw dr Katrinka Blazingsmith and dr Katrinka Blazingsmith wrote them, so she told me to destroy these. Will shred.

## 2015-03-19 ENCOUNTER — Encounter: Payer: Self-pay | Admitting: Family Medicine

## 2015-03-19 DIAGNOSIS — F988 Other specified behavioral and emotional disorders with onset usually occurring in childhood and adolescence: Secondary | ICD-10-CM

## 2015-03-19 MED ORDER — AMPHETAMINE-DEXTROAMPHETAMINE 30 MG PO TABS: 15.0000 mg | ORAL_TABLET | Freq: Three times a day (TID) | ORAL | Status: DC

## 2015-03-19 NOTE — Telephone Encounter (Signed)
Ready - pt knows through mychart. 

## 2015-03-20 ENCOUNTER — Encounter: Payer: Self-pay | Admitting: Family Medicine

## 2015-03-20 ENCOUNTER — Ambulatory Visit (INDEPENDENT_AMBULATORY_CARE_PROVIDER_SITE_OTHER): Payer: 59 | Admitting: Family Medicine

## 2015-03-20 VITALS — BP 110/80 | HR 105 | Temp 98.5°F | Ht 66.0 in | Wt 198.1 lb

## 2015-03-20 DIAGNOSIS — G894 Chronic pain syndrome: Secondary | ICD-10-CM

## 2015-03-20 DIAGNOSIS — K5900 Constipation, unspecified: Secondary | ICD-10-CM | POA: Diagnosis not present

## 2015-03-20 MED ORDER — LUBIPROSTONE 24 MCG PO CAPS: 24.0000 ug | ORAL_CAPSULE | Freq: Two times a day (BID) | ORAL | Status: DC

## 2015-03-20 MED ORDER — OXYCODONE HCL 20 MG PO TABS: 50.0000 mg | ORAL_TABLET | ORAL | Status: DC

## 2015-03-20 NOTE — Patient Instructions (Signed)
I am referring you to a pain clinic because the guidelines now require a pain specialist to prescribe ongoing opiate medicine

## 2015-03-20 NOTE — Progress Notes (Signed)
° °  Subjective:    Patient ID: [name], female    DOB: 1971-02-03, 44 y.o.   MRN: [id number] This chart was scribed for Elvina SidleKurt Lauenstein, MD by Littie Deedsichard Sun, Medical Scribe. This patient was seen in Room 6 and the patient's care was started at 1:00 PM.   HPI HPI Comments: [name] is a 44 y.o. female who presents to the [facility] for a medication refill. Patient has been doing well with her current medications. She is on oxycodone for back pain due to two disc problems and three knee surgeries; this is usually refilled by Theora Gianottihelle Jeffrey, PA-C. She normally takes oxycodone every 4 hours. She has been to the [facility] before, but she did not have a good experience there as they wanted to put a stimulator in her back on her first visit. She has not been to any other pain clinics. Patient denies constipation.  Review of Systems  Gastrointestinal: Negative for constipation.  Musculoskeletal: Positive for back pain and arthralgias.       Objective:   Physical Exam CONSTITUTIONAL: Well developed/well nourished HEAD: Normocephalic/atraumatic EYES: EOM/PERRL ENMT: Mucous membranes moist NECK: supple no meningeal signs SPINE: Normal straight leg raising. CV: S1/S2 noted, no murmurs/rubs/gallops noted LUNGS: Lungs are clear to auscultation bilaterally, no apparent distress ABDOMEN: soft, nontender, no rebound or guarding GU: no cva tenderness NEURO: Pt is awake/alert, moves all extremitiesx4. Normal sensation. No muscle wasting. Good strength in both lower extremities. EXTREMITIES: pulses normal, full ROM SKIN: warm, color normal PSYCH: no abnormalities of mood noted, patient is seen with her significant other and is in no distress. She is alert and cooperative  No rashes    Assessment & Plan:   This chart was scribed in my presence and reviewed by me personally.    ICD-9-CM ICD-10-CM   1. Chronic pain disorder 338.4 G89.4 Oxycodone HCl 20 MG TABS   Ambulatory referral to Pain Clinic  2. Constipation, unspecified constipation type 564.00 K59.00 lubiprostone (AMITIZA) 24 MCG capsule     Signed, Elvina SidleKurt Lauenstein, MD

## 2015-03-22 ENCOUNTER — Encounter: Payer: Self-pay | Admitting: Physician Assistant

## 2015-03-22 MED ORDER — OXYCODONE HCL 20 MG PO TABS: 50.0000 mg | ORAL_TABLET | ORAL | Status: DC

## 2015-03-22 NOTE — Telephone Encounter (Signed)
After printing Rx, realized that patient received it from Dr. Milus GlazierLauenstein this weekend. Destroyed Rx.  Patient notified via My Chart.

## 2015-03-22 NOTE — Telephone Encounter (Signed)
Patient notified via My Chart.  Meds ordered this encounter  Medications  . Oxycodone HCl 20 MG TABS    Sig: Take 2.5 tablets (50 mg total) by mouth every 4 (four) hours.    Dispense:  210 tablet    Refill:  0    Order Specific Question:  Supervising Provider    Answer:  DOOLITTLE, ROBERT P [[id number]]

## 2015-03-23 ENCOUNTER — Other Ambulatory Visit: Payer: Self-pay | Admitting: *Deleted

## 2015-03-23 DIAGNOSIS — K5900 Constipation, unspecified: Secondary | ICD-10-CM

## 2015-03-23 NOTE — Telephone Encounter (Signed)
Amitiza fax request from pharmacy has already been refilled by her PCP. On 03/22/2015

## 2015-03-24 ENCOUNTER — Ambulatory Visit (INDEPENDENT_AMBULATORY_CARE_PROVIDER_SITE_OTHER): Payer: 59 | Admitting: Physician Assistant

## 2015-03-24 VITALS — BP 120/82 | HR 114 | Temp 99.0°F | Resp 18 | Ht 66.25 in | Wt 199.0 lb

## 2015-03-24 DIAGNOSIS — F988 Other specified behavioral and emotional disorders with onset usually occurring in childhood and adolescence: Secondary | ICD-10-CM

## 2015-03-24 DIAGNOSIS — G894 Chronic pain syndrome: Secondary | ICD-10-CM | POA: Diagnosis not present

## 2015-03-24 DIAGNOSIS — F909 Attention-deficit hyperactivity disorder, unspecified type: Secondary | ICD-10-CM | POA: Diagnosis not present

## 2015-03-24 DIAGNOSIS — F419 Anxiety disorder, unspecified: Secondary | ICD-10-CM

## 2015-03-24 MED ORDER — AMPHETAMINE-DEXTROAMPHETAMINE 30 MG PO TABS: 15.0000 mg | ORAL_TABLET | Freq: Three times a day (TID) | ORAL | Status: DC

## 2015-03-24 NOTE — Progress Notes (Signed)
Patient ID: [name], female    DOB: Mar 27, 1971, 44 y.o.   MRN: [id number]  PCP: Olene Floss  Subjective:   Chief Complaint  Patient presents with  . Follow-up    discuss meds    HPI Presents to discuss treatment of chronic medical problems requiring controlled substance prescriptions.  She was here on 03/21/2015 and saw Dr. Milus Glazier in my absence. He told her that she would need to see Pain Management to continue these medications.  Referral was placed.  We've referred her previously, but she's had poor experience and did not follow-up. Because she declined care there, subsequent facilities have declined to see her. Her history of substance abuse has also complicated her treatment.  Most recently, I referred her to Dr. Nickola Major in hopes that she would have some non-narcotic treatment recommendations that could reduce the need for pain medications. Her appointment was today, which she had to reschedule due to unexpected events this morning.  Her spouse lost her job as an ED nurse this morning.  Their family's insurance is going to end on 04/06/2015. Her spouse has already begun looking for new employment and they hope to minimize the gap in health benefits for their family.  Abilify is her most expensive medication. Next fill will be due just after she loses her benefits.  Oxycodone Rx lasted 3 days longer than usual this last time, and she's considering reducing the dose overall.  Filled the most recent Rx (from Dr. Milus Glazier during my absence), on 03/21/2015. Next Rx will be due 5/30. Would I consider providing a 30-day supply at that point for considerable cost-savings if there will be a gap in insurance.  Adderall Rx written by Benny Lennert on 5/13 was to be picked up today, but it has been purged from the pick-up drawer.  Alprazolam due 04/03/2015.  Overall, she feels like she's in a really good place health-wise.    Review of Systems     Patient Active  Problem List   Diagnosis Date Noted  . Abdominal pain, right lower quadrant 07/01/2014  . IBS (irritable bowel syndrome) 06/03/2014  . Anxiety 03/09/2014  . Obesity (BMI 30-39.9) 10/27/2013  . Neck pain on left side 09/02/2013  . Cervical radiculopathy 08/18/2013  . Chronic pain disorder 08/16/2013  . Drug-seeking behavior 08/16/2013  . Muscle spasm of left shoulder area 08/16/2013  . Lumbar back pain 11/21/2012  . Esophageal stricture 11/21/2012  . ADD (attention deficit disorder) 11/21/2012  . Hypertriglyceridemia 11/21/2012  . Pain 08/06/2012  . Carpal tunnel syndrome, bilateral 07/05/2012  . OSA (obstructive sleep apnea) 06/28/2012  . Constipation 01/01/2012  . Migraine headache 03/02/2011  . Depression 03/02/2011  . History of substance abuse 03/02/2011  . Hypertension 03/02/2011  . Abdominal pain, epigastric 02/20/2011     Prior to Admission medications   Medication Sig Start Date End Date Taking? Authorizing Provider  acetaminophen (TYLENOL) 500 MG tablet Take 1,000 mg by mouth every 6 (six) hours as needed for moderate pain.   Yes Historical Provider, MD  ALPRAZolam Prudy Feeler) 1 MG tablet Take 1 tablet as needed Morning and mid-day; take 3 tablets at bedtime as needed. 03/05/15  Yes Melanee Cordial, PA-C  amLODipine (NORVASC) 10 MG tablet Take 1 tablet (10 mg total) by mouth at bedtime. 11/07/14  Yes Morrell Riddle, PA-C  amphetamine-dextroamphetamine (ADDERALL) 30 MG tablet Take 0.5 tablets by mouth 3 (three) times daily. 03/19/15  Yes Morrell Riddle, PA-C  ARIPiprazole (ABILIFY) 10 MG tablet TAKE  1 TABLET BY MOUTH DAILY. 01/03/15  Yes Himmat Enberg, PA-C  cyclobenzaprine (FLEXERIL) 10 MG tablet Take 1 tablet (10 mg total) by mouth 2 (two) times daily as needed for muscle spasms. 12/06/14  Yes Renne Crigler, PA-C  EPINEPHrine (EPIPEN) 0.3 mg/0.3 mL IJ SOAJ injection Inject 0.3 mLs (0.3 mg total) into the muscle once as needed (Anaphylaxis). 07/01/14  Yes Lovena Kluck, PA-C    ibuprofen (ADVIL,MOTRIN) 200 MG tablet Take 600 mg by mouth 2 (two) times daily as needed for pain.   Yes Historical Provider, MD  lubiprostone (AMITIZA) 24 MCG capsule Take 1 capsule (24 mcg total) by mouth 2 (two) times daily with a meal. 03/20/15  Yes Elvina Sidle, MD  neomycin-polymyxin-hydrocortisone (CORTISPORIN) otic solution Place 3 drops into both ears 4 (four) times daily. 01/22/15  Yes Ofilia Neas, PA-C  ondansetron (ZOFRAN-ODT) 8 MG disintegrating tablet DISSOLVE 1 TABLET BY MOUTH EVERY 8 HOURS AS NEEDED FOR NAUSEA 01/21/15  Yes [name], MD  Oxycodone HCl 20 MG TABS Take 2.5 tablets (50 mg total) by mouth every 4 (four) hours. 03/22/15  Yes Florette Thai, PA-C  pantoprazole (PROTONIX) 40 MG tablet Take 1 tablet (40 mg total) by mouth at bedtime. 06/03/14  Yes Endya Austin, PA-C  sertraline (ZOLOFT) 100 MG tablet TAKE 1 AND 1/2 TABLETS BY MOUTH AT BEDTIME 10/14/14  Yes Kendarrius Tanzi, PA-C  valACYclovir (VALTREX) 1000 MG tablet Take 1 tablet (1,000 mg total) by mouth daily. 02/12/15  Yes Porfirio Oar, PA-C     Allergies  Allergen Reactions  . Imitrex [Sumatriptan Base] Shortness Of Breath and Other (See Comments)    TACHYCARDIA  . Trazodone And Nefazodone Other (See Comments)    NIGHTMARES  . Wellbutrin [Bupropion] Other (See Comments)    "crawling out of my skin"  . Effexor [Venlafaxine] Other (See Comments)    MAKES HER "JITTERY"  . Metoclopramide Hcl Itching  . Morphine And Related Itching  . Tegretol [Carbamazepine] Itching  . Topamax Itching       Objective:  Physical Exam  Constitutional: She is oriented to person, place, and time. She appears well-developed and well-nourished. She is active and cooperative. No distress.  BP 120/82 mmHg  Pulse 114  Temp(Src) 99 F (37.2 C) (Oral)  Resp 18  Ht 5' 6.25" (1.683 m)  Wt 199 lb (90.266 kg)  BMI 31.87 kg/m2  SpO2 99%   Eyes: Conjunctivae are normal.  Pulmonary/Chest: Effort normal.  Neurological: She  is alert and oriented to person, place, and time.  Psychiatric: She has a normal mood and affect. Her speech is normal and behavior is normal.           Assessment & Plan:   1. Chronic pain disorder Hopefully, we will be able to get her in with Pain Management. She will reschedule her visit with Dr. Nickola Major. I will continue to prescribe her pain medication, and she understands that she will be subject to UDS as we implement newer recommendations for the safer prescribing of these medications. She will contact me next week to get the next prescription. I plan to provide a 30-day supply (rather than the 2-week supply) to minimize her cost while un-insured.  2. ADD (attention deficit disorder) Rx re-done, as original from 5/13 was purged from the pick-up drawer. - amphetamine-dextroamphetamine (ADDERALL) 30 MG tablet; Take 0.5 tablets by mouth 3 (three) times daily.  Dispense: 45 tablet; Refill: 0  3. Anxiety Stable. She will contact me next week for the next  prescription for this medication.  Return for re-evaluation once you have insurance again, or in 3 months.    [name], PA-C Physician Assistant-Certified [facility] .

## 2015-03-24 NOTE — Patient Instructions (Signed)
Contact me next week to get the medications you need filled before you lose insurance benefits.

## 2015-03-31 ENCOUNTER — Encounter: Payer: Self-pay | Admitting: Physician Assistant

## 2015-03-31 ENCOUNTER — Telehealth: Payer: Self-pay | Admitting: Radiology

## 2015-03-31 DIAGNOSIS — F419 Anxiety disorder, unspecified: Secondary | ICD-10-CM

## 2015-03-31 DIAGNOSIS — G4700 Insomnia, unspecified: Secondary | ICD-10-CM

## 2015-03-31 DIAGNOSIS — G894 Chronic pain syndrome: Secondary | ICD-10-CM

## 2015-03-31 MED ORDER — OXYCODONE HCL 20 MG PO TABS: 50.0000 mg | ORAL_TABLET | ORAL | Status: DC

## 2015-03-31 MED ORDER — ALPRAZOLAM 1 MG PO TABS: ORAL_TABLET | ORAL | Status: DC

## 2015-03-31 NOTE — Telephone Encounter (Signed)
Patient notified via My Chart.  Meds ordered this encounter  Medications  . ALPRAZolam (XANAX) 1 MG tablet    Sig: Take 1 tablet as needed Morning and mid-day; take 3 tablets at bedtime as needed.    Dispense:  150 tablet    Refill:  0    Order Specific Question:  Supervising Provider    Answer:  DOOLITTLE, ROBERT P [[id number]]  . Oxycodone HCl 20 MG TABS    Sig: Take 2.5 tablets (50 mg total) by mouth every 4 (four) hours.    Dispense:  210 tablet    Refill:  0    Order Specific Question:  Supervising Provider    Answer:  DOOLITTLE, ROBERT P [[id number]]

## 2015-04-01 ENCOUNTER — Encounter: Payer: Self-pay | Admitting: Physician Assistant

## 2015-04-01 ENCOUNTER — Other Ambulatory Visit: Payer: Self-pay | Admitting: Physician Assistant

## 2015-04-01 DIAGNOSIS — G894 Chronic pain syndrome: Secondary | ICD-10-CM

## 2015-04-01 MED ORDER — OXYCODONE HCL 20 MG PO TABS: 50.0000 mg | ORAL_TABLET | ORAL | Status: DC

## 2015-04-05 ENCOUNTER — Other Ambulatory Visit: Payer: Self-pay

## 2015-04-05 DIAGNOSIS — K222 Esophageal obstruction: Secondary | ICD-10-CM

## 2015-04-05 MED ORDER — PANTOPRAZOLE SODIUM 40 MG PO TBEC: 40.0000 mg | DELAYED_RELEASE_TABLET | Freq: Every day | ORAL | Status: DC

## 2015-04-06 MED ORDER — ARIPIPRAZOLE 10 MG PO TABS: 10.0000 mg | ORAL_TABLET | Freq: Every day | ORAL | Status: DC

## 2015-04-06 NOTE — Telephone Encounter (Signed)
Patient notified via My Chart.   Meds ordered this encounter  Medications  . ARIPiprazole (ABILIFY) 10 MG tablet    Sig: Take 1 tablet (10 mg total) by mouth daily.    Dispense:  90 tablet    Refill:  1    Order Specific Question:  Supervising Provider    Answer:  DOOLITTLE, ROBERT P [[id number]]

## 2015-04-29 ENCOUNTER — Encounter: Payer: Self-pay | Admitting: Physician Assistant

## 2015-04-29 DIAGNOSIS — F419 Anxiety disorder, unspecified: Secondary | ICD-10-CM

## 2015-04-29 DIAGNOSIS — G4700 Insomnia, unspecified: Secondary | ICD-10-CM

## 2015-04-30 MED ORDER — ALPRAZOLAM 1 MG PO TABS: ORAL_TABLET | ORAL | Status: DC

## 2015-04-30 NOTE — Telephone Encounter (Signed)
Patient notified via My Chart.  Meds ordered this encounter  Medications  . ALPRAZolam (XANAX) 1 MG tablet    Sig: Take 1 tablet as needed Morning and mid-day; take 3 tablets at bedtime as needed.    Dispense:  150 tablet    Refill:  0    Order Specific Question:  Supervising Provider    Answer:  DOOLITTLE, ROBERT P [[id number]]

## 2015-04-30 NOTE — Telephone Encounter (Signed)
Patient called for xanax refill

## 2015-05-05 ENCOUNTER — Encounter: Payer: Self-pay | Admitting: Physician Assistant

## 2015-05-05 DIAGNOSIS — G894 Chronic pain syndrome: Secondary | ICD-10-CM

## 2015-05-05 MED ORDER — OXYCODONE HCL 20 MG PO TABS: 50.0000 mg | ORAL_TABLET | ORAL | Status: DC

## 2015-05-05 NOTE — Telephone Encounter (Signed)
Patient notified via My Chart.  Meds ordered this encounter  Medications  . Oxycodone HCl 20 MG TABS    Sig: Take 2.5 tablets (50 mg total) by mouth every 4 (four) hours.    Dispense:  420 tablet    Refill:  0    This is a 30-day supply    Order Specific Question:  Supervising Provider    Answer:  DOOLITTLE, ROBERT P [[id number]]

## 2015-05-23 ENCOUNTER — Ambulatory Visit (INDEPENDENT_AMBULATORY_CARE_PROVIDER_SITE_OTHER): Payer: Commercial Managed Care - PPO | Admitting: Physician Assistant

## 2015-05-23 VITALS — BP 102/80 | HR 85 | Temp 99.0°F | Resp 12 | Ht 65.75 in | Wt 196.1 lb

## 2015-05-23 DIAGNOSIS — I10 Essential (primary) hypertension: Secondary | ICD-10-CM

## 2015-05-23 DIAGNOSIS — G894 Chronic pain syndrome: Secondary | ICD-10-CM | POA: Diagnosis not present

## 2015-05-23 MED ORDER — OXYCODONE HCL 20 MG PO TABS: 50.0000 mg | ORAL_TABLET | ORAL | Status: DC

## 2015-05-23 MED ORDER — AMLODIPINE BESYLATE 10 MG PO TABS: 10.0000 mg | ORAL_TABLET | Freq: Every day | ORAL | Status: DC

## 2015-05-23 NOTE — Progress Notes (Signed)
Patient ID: [name], female    DOB: [birth year], 44 y.o.   MRN: [id number]  PCP: Olene Floss  Subjective:   Chief Complaint  Patient presents with  . Medication Refill    Amlodipine & Oxycodone    HPI Presents for medication refill.  Her last prescription was short because the pharmacy did not have adequate supply to fill it in it's entirety. Filled 7/02, #300.  Her pain is stable. Feels that her current dose is adequate in controlling her symptoms. No ready to reduce the dose yet. No new symptoms.  Review of Systems  Constitutional: Negative for fever, chills, diaphoresis, activity change, appetite change, fatigue and unexpected weight change.  Eyes: Negative for visual disturbance.  Respiratory: Negative for chest tightness and shortness of breath.   Cardiovascular: Negative for chest pain, palpitations and leg swelling.  Gastrointestinal: Negative for nausea, vomiting and diarrhea.  Genitourinary: Negative for dysuria, urgency and frequency.  Musculoskeletal: Positive for back pain (lumbar) and arthralgias (Knee). Negative for gait problem, neck pain and neck stiffness.  Skin: Negative for rash and wound.  Neurological: Negative for dizziness and weakness.  Psychiatric/Behavioral: Negative for self-injury and dysphoric mood. The patient is not nervous/anxious.        Patient Active Problem List   Diagnosis Date Noted  . Abdominal pain, right lower quadrant 07/01/2014  . IBS (irritable bowel syndrome) 06/03/2014  . Anxiety 03/09/2014  . Obesity (BMI 30-39.9) 10/27/2013  . Neck pain on left side 09/02/2013  . Cervical radiculopathy 08/18/2013  . Chronic pain disorder 08/16/2013  . Drug-seeking behavior 08/16/2013  . Muscle spasm of left shoulder area 08/16/2013  . Lumbar back pain 11/21/2012  . Esophageal stricture 11/21/2012  . ADD (attention deficit disorder) 11/21/2012  . Hypertriglyceridemia 11/21/2012  . Pain 08/06/2012  . Carpal tunnel syndrome,  bilateral 07/05/2012  . OSA (obstructive sleep apnea) 06/28/2012  . Constipation 01/01/2012  . Migraine headache 03/02/2011  . Depression 03/02/2011  . History of substance abuse 03/02/2011  . Hypertension 03/02/2011  . Abdominal pain, epigastric 02/20/2011     Prior to Admission medications   Medication Sig Start Date End Date Taking? Authorizing Provider  acetaminophen (TYLENOL) 500 MG tablet Take 1,000 mg by mouth every 6 (six) hours as needed for moderate pain.   Yes Historical Provider, MD  ALPRAZolam Prudy Feeler) 1 MG tablet Take 1 tablet as needed Morning and mid-day; take 3 tablets at bedtime as needed. 04/30/15  Yes Tayana Shankle, PA-C  amLODipine (NORVASC) 10 MG tablet Take 1 tablet (10 mg total) by mouth at bedtime. 11/07/14  Yes Morrell Riddle, PA-C  amphetamine-dextroamphetamine (ADDERALL) 30 MG tablet Take 0.5 tablets by mouth 3 (three) times daily. 03/24/15  Yes Anyi Fels, PA-C  ARIPiprazole (ABILIFY) 10 MG tablet Take 1 tablet (10 mg total) by mouth daily. 04/06/15  Yes Kynzlie Hilleary, PA-C  EPINEPHrine (EPIPEN) 0.3 mg/0.3 mL IJ SOAJ injection Inject 0.3 mLs (0.3 mg total) into the muscle once as needed (Anaphylaxis). 07/01/14  Yes Lesly Joslyn, PA-C  ibuprofen (ADVIL,MOTRIN) 200 MG tablet Take 600 mg by mouth 2 (two) times daily as needed for pain.   Yes Historical Provider, MD  lubiprostone (AMITIZA) 24 MCG capsule Take 1 capsule (24 mcg total) by mouth 2 (two) times daily with a meal. 03/20/15  Yes Elvina Sidle, MD  neomycin-polymyxin-hydrocortisone (CORTISPORIN) otic solution Place 3 drops into both ears 4 (four) times daily. 01/22/15  Yes Ofilia Neas, PA-C  ondansetron (ZOFRAN-ODT) 8 MG disintegrating tablet DISSOLVE  1 TABLET BY MOUTH EVERY 8 HOURS AS NEEDED FOR NAUSEA 01/21/15  Yes [name], MD  Oxycodone HCl 20 MG TABS Take 2.5 tablets (50 mg total) by mouth every 4 (four) hours. 05/05/15  Yes Camyah Pultz, PA-C  pantoprazole (PROTONIX) 40 MG tablet Take 1 tablet  (40 mg total) by mouth at bedtime. 04/05/15  Yes Irisa Grimsley, PA-C  sertraline (ZOLOFT) 100 MG tablet TAKE 1 AND 1/2 TABLETS BY MOUTH AT BEDTIME 10/14/14  Yes Nyesha Cliff, PA-C  valACYclovir (VALTREX) 1000 MG tablet Take 1 tablet (1,000 mg total) by mouth daily. 02/12/15  Yes Sherrel Ploch, PA-C  cyclobenzaprine (FLEXERIL) 10 MG tablet Take 1 tablet (10 mg total) by mouth 2 (two) times daily as needed for muscle spasms. Patient not taking: Reported on 05/23/2015 12/06/14   Renne Crigler, PA-C     Allergies  Allergen Reactions  . Imitrex [Sumatriptan Base] Shortness Of Breath and Other (See Comments)    TACHYCARDIA  . Trazodone And Nefazodone Other (See Comments)    NIGHTMARES  . Wellbutrin [Bupropion] Other (See Comments)    "crawling out of my skin"  . Effexor [Venlafaxine] Other (See Comments)    MAKES HER "JITTERY"  . Metoclopramide Hcl Itching  . Morphine And Related Itching  . Tegretol [Carbamazepine] Itching  . Topamax Itching       Objective:  Physical Exam  Constitutional: She is oriented to person, place, and time. She appears well-developed and well-nourished. No distress.  BP 102/80 mmHg  Pulse 85  Temp(Src) 99 F (37.2 C) (Oral)  Resp 12  Ht 5' 5.75" (1.67 m)  Wt 196 lb 2 oz (88.962 kg)  BMI 31.90 kg/m2  SpO2 98%   Eyes: Conjunctivae are normal. No scleral icterus.  Neck: No thyromegaly present.  Cardiovascular: Normal rate, regular rhythm, normal heart sounds and intact distal pulses.   Pulmonary/Chest: Effort normal and breath sounds normal.  Musculoskeletal:       Cervical back: Normal.       Thoracic back: Normal.       Lumbar back: She exhibits decreased range of motion, tenderness, bony tenderness and pain. She exhibits no spasm.  Lymphadenopathy:    She has no cervical adenopathy.  Neurological: She is alert and oriented to person, place, and time.  Reflex Scores:      Patellar reflexes are 1+ on the right side and 1+ on the left side.       Achilles reflexes are 2+ on the right side and 2+ on the left side. Skin: Skin is warm and dry.  Psychiatric: She has a normal mood and affect. Her speech is normal and behavior is normal.           Assessment & Plan:   1. Essential hypertension Controlled. Continue current treatment. If develops light-headedness, dizziness or if BP remains this low, consider reducing dose to 5 mg. - amLODipine (NORVASC) 10 MG tablet; Take 1 tablet (10 mg total) by mouth at bedtime.  Dispense: 90 tablet; Refill: 1  2. Chronic pain disorder Continue current dose. Hope to be able to reduce dose in the future.  - Oxycodone HCl 20 MG TABS; Take 2.5 tablets (50 mg total) by mouth every 4 (four) hours.  Dispense: 420 tablet; Refill: 0  Return in about 3 months (around 08/23/2015).    [name], PA-C Physician Assistant-Certified [facility]

## 2015-05-28 ENCOUNTER — Encounter: Payer: Self-pay | Admitting: Physician Assistant

## 2015-05-28 DIAGNOSIS — F419 Anxiety disorder, unspecified: Secondary | ICD-10-CM

## 2015-05-28 DIAGNOSIS — G4700 Insomnia, unspecified: Secondary | ICD-10-CM

## 2015-05-28 MED ORDER — ALPRAZOLAM 1 MG PO TABS: ORAL_TABLET | ORAL | Status: DC

## 2015-05-28 NOTE — Telephone Encounter (Signed)
Patient notified via My Chart.  Meds ordered this encounter  Medications  . ALPRAZolam (XANAX) 1 MG tablet    Sig: Take 1 tablet as needed Morning and mid-day; take 3 tablets at bedtime as needed.    Dispense:  150 tablet    Refill:  0    Order Specific Question:  Supervising Provider    Answer:  DOOLITTLE, ROBERT P [[id number]]    

## 2015-06-16 ENCOUNTER — Ambulatory Visit (INDEPENDENT_AMBULATORY_CARE_PROVIDER_SITE_OTHER): Payer: Commercial Managed Care - PPO | Admitting: Physician Assistant

## 2015-06-16 VITALS — BP 100/80 | HR 114 | Temp 98.4°F | Resp 18 | Ht 66.5 in | Wt 198.0 lb

## 2015-06-16 DIAGNOSIS — B009 Herpesviral infection, unspecified: Secondary | ICD-10-CM | POA: Diagnosis not present

## 2015-06-16 DIAGNOSIS — F909 Attention-deficit hyperactivity disorder, unspecified type: Secondary | ICD-10-CM

## 2015-06-16 DIAGNOSIS — F329 Major depressive disorder, single episode, unspecified: Secondary | ICD-10-CM

## 2015-06-16 DIAGNOSIS — G894 Chronic pain syndrome: Secondary | ICD-10-CM | POA: Diagnosis not present

## 2015-06-16 DIAGNOSIS — F32A Depression, unspecified: Secondary | ICD-10-CM

## 2015-06-16 DIAGNOSIS — K222 Esophageal obstruction: Secondary | ICD-10-CM

## 2015-06-16 DIAGNOSIS — F988 Other specified behavioral and emotional disorders with onset usually occurring in childhood and adolescence: Secondary | ICD-10-CM

## 2015-06-16 MED ORDER — OXYCODONE HCL 20 MG PO TABS: 50.0000 mg | ORAL_TABLET | ORAL | Status: DC

## 2015-06-16 MED ORDER — PANTOPRAZOLE SODIUM 40 MG PO TBEC: 40.0000 mg | DELAYED_RELEASE_TABLET | Freq: Every day | ORAL | Status: DC

## 2015-06-16 MED ORDER — AMPHETAMINE-DEXTROAMPHETAMINE 15 MG PO TABS: 15.0000 mg | ORAL_TABLET | Freq: Three times a day (TID) | ORAL | Status: DC

## 2015-06-16 MED ORDER — SERTRALINE HCL 100 MG PO TABS: 100.0000 mg | ORAL_TABLET | Freq: Every day | ORAL | Status: DC

## 2015-06-16 MED ORDER — VALACYCLOVIR HCL 1 G PO TABS: 1000.0000 mg | ORAL_TABLET | Freq: Every day | ORAL | Status: AC

## 2015-06-16 NOTE — Progress Notes (Signed)
[name]  MRN: [id number] DOB: 1971/08/29  Subjective:  Pt presents to clinic for medication refill.  She is doing well.  She has had problems with the new pharmacy and them having all her oxycodone in stock for a 1 month supply.  She also feels like she did not get enough to get her through this month.  Otherwise she is doing well with her medications.   Patient Active Problem List   Diagnosis Date Noted  . 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 Abdominal pain, right lower quadrant 07/01/2014  . IBS (irritable bowel syndrome) 06/03/2014  . Anxiety 03/09/2014  . Obesity (BMI 30-39.9) 10/27/2013  . Neck pain on left side 09/02/2013  . Cervical radiculopathy 08/18/2013  . Chronic pain disorder 08/16/2013  . Drug-seeking behavior 08/16/2013  . Muscle spasm of left shoulder area 08/16/2013  . Lumbar back pain 11/21/2012  . Esophageal stricture 11/21/2012  . ADD (attention deficit disorder) 11/21/2012  . Hypertriglyceridemia 11/21/2012  . Pain 08/06/2012  . Carpal tunnel syndrome, bilateral 07/05/2012  . OSA (obstructive sleep apnea) 06/28/2012  . Constipation 01/01/2012  . Migraine headache 03/02/2011  . Depression 03/02/2011  . History of substance abuse 03/02/2011  . Hypertension 03/02/2011  . Abdominal pain, epigastric 02/20/2011    Current Outpatient Prescriptions on File Prior to Visit  Medication Sig Dispense Refill  . ALPRAZolam (XANAX) 1 MG tablet Take 1 tablet as needed Morning and mid-day; take 3 tablets at bedtime as needed. 150 tablet 0  . amLODipine (NORVASC) 10 MG tablet Take 1 tablet (10 mg total) by mouth at bedtime. 90 tablet 1  . amphetamine-dextroamphetamine (ADDERALL) 30 MG tablet Take 0.5 tablets by mouth 3 (three) times daily. 45 tablet 0  . ARIPiprazole (ABILIFY) 10 MG tablet Take 1 tablet (10 mg total) by mouth daily. 90 tablet 1  . EPINEPHrine (EPIPEN) 0.3 mg/0.3 mL IJ SOAJ injection Inject 0.3 mLs (0.3 mg total) into the muscle once as needed (Anaphylaxis). 1  Device 2  . ibuprofen (ADVIL,MOTRIN) 200 MG tablet Take 600 mg by mouth 2 (two) times daily as needed for pain.    Marland Kitchen lubiprostone (AMITIZA) 24 MCG capsule Take 1 capsule (24 mcg total) by mouth 2 (two) times daily with a meal. 60 capsule 3  . neomycin-polymyxin-hydrocortisone (CORTISPORIN) otic solution Place 3 drops into both ears 4 (four) times daily. 10 mL 0  . ondansetron (ZOFRAN-ODT) 8 MG disintegrating tablet DISSOLVE 1 TABLET BY MOUTH EVERY 8 HOURS AS NEEDED FOR NAUSEA 90 tablet 1  . acetaminophen (TYLENOL) 500 MG tablet Take 1,000 mg by mouth every 6 (six) hours as needed for moderate pain.    . cyclobenzaprine (FLEXERIL) 10 MG tablet Take 1 tablet (10 mg total) by mouth 2 (two) times daily as needed for muscle spasms. (Patient not taking: Reported on 05/23/2015) 14 tablet 0   No current facility-administered medications on file prior to visit.    Allergies  Allergen Reactions  . Imitrex [Sumatriptan Base] Shortness Of Breath and Other (See Comments)    TACHYCARDIA  . Trazodone And Nefazodone Other (See Comments)    NIGHTMARES  . Wellbutrin [Bupropion] Other (See Comments)    "crawling out of my skin"  . Effexor [Venlafaxine] Other (See Comments)    MAKES HER "JITTERY"  . Metoclopramide Hcl Itching  . Morphine And Related Itching  . Tegretol [Carbamazepine] Itching  . Topamax Itching    Review of Systems Objective:  BP 100/80 mmHg  Pulse 114  Temp(Src) 98.4 F (36.9 C) (  Oral)  Resp 18  Ht 5' 6.5" (1.689 m)  Wt 198 lb (89.812 kg)  BMI 31.48 kg/m2  SpO2 98%  Physical Exam  Constitutional: She is oriented to person, place, and time and well-developed, well-nourished, and in no distress.  HENT:  Head: Normocephalic and atraumatic.  Right Ear: Hearing and external ear normal.  Left Ear: Hearing and external ear normal.  Eyes: Conjunctivae are normal.  Neck: Normal range of motion.  Pulmonary/Chest: Effort normal.  Neurological: She is alert and oriented to person,  place, and time. Gait normal.  Skin: Skin is warm and dry.  Psychiatric: Mood, memory, affect and judgment normal.  Vitals reviewed.   Assessment and Plan :  Chronic pain disorder - I suspect she ran out of her medications early because her month supply only gave her 28 days with how she takes the medication.  At the patient request due to problems at the pharmacy I have written her 2 2 week supplies today which will give her a total of a 30d supply of medications -  Plan: Oxycodone HCl 20 MG TABS, Oxycodone HCl 20 MG TABS,   ADD (attention deficit disorder) - Plan: amphetamine-dextroamphetamine (ADDERALL) 15 MG tablet - I decreased her mg Adderall to 15mg  because that is all she takes a day and taking a whole pill will be easier for her daughter who gives her medications daily.  Esophageal stricture - Plan: pantoprazole (PROTONIX) 40 MG tablet  Depression - Plan: sertraline (ZOLOFT) 100 MG tablet  HSV-2 infection - Plan: valACYclovir (VALTREX) 1000 MG tablet  Meds refilled.  Benny Lennert PA-C  [facility] 06/16/2015 2:02 PM

## 2015-06-25 ENCOUNTER — Encounter: Payer: Self-pay | Admitting: Physician Assistant

## 2015-06-25 DIAGNOSIS — G4700 Insomnia, unspecified: Secondary | ICD-10-CM

## 2015-06-25 DIAGNOSIS — F419 Anxiety disorder, unspecified: Secondary | ICD-10-CM

## 2015-06-25 MED ORDER — ALPRAZOLAM 2 MG PO TABS: ORAL_TABLET | ORAL | Status: DC

## 2015-06-25 NOTE — Telephone Encounter (Signed)
Patient notified via My Chart.  Meds ordered this encounter  Medications  . ALPRAZolam (XANAX) 2 MG tablet    Sig: Take 1/2 tablet (1 mg) as needed Morning and mid-day; take 1.5 tablets (3 mg) at bedtime as needed.    Dispense:  75 tablet    Refill:  0    Order Specific Question:  Supervising Provider    Answer:  DOOLITTLE, ROBERT P [[id number]]

## 2015-07-06 ENCOUNTER — Ambulatory Visit (INDEPENDENT_AMBULATORY_CARE_PROVIDER_SITE_OTHER): Payer: Commercial Managed Care - PPO | Admitting: Physician Assistant

## 2015-07-06 VITALS — BP 122/82 | HR 107 | Temp 97.8°F | Resp 16 | Ht 66.5 in | Wt 197.0 lb

## 2015-07-06 DIAGNOSIS — H60391 Other infective otitis externa, right ear: Secondary | ICD-10-CM

## 2015-07-06 DIAGNOSIS — K5900 Constipation, unspecified: Secondary | ICD-10-CM

## 2015-07-06 MED ORDER — CIPROFLOXACIN-HYDROCORTISONE 0.2-1 % OT SUSP: 3.0000 [drp] | Freq: Two times a day (BID) | OTIC | Status: DC

## 2015-07-06 MED ORDER — LUBIPROSTONE 24 MCG PO CAPS: 24.0000 ug | ORAL_CAPSULE | Freq: Two times a day (BID) | ORAL | Status: DC

## 2015-07-06 NOTE — Progress Notes (Signed)
[name]  MRN: [id number] DOB: January 18, 1971  Subjective:  Pt presents to clinic with right ear pain and sore throat for the last 3 days and overall she seems to be feeling worse as the time goes on.  She has not been exposed to anything recently that would cause increase problems with allergies.  She has not been exposed to any illnesses that she knows of.  She has had problems with dry skin in the ear in the past.  She has some nasal congestion but it is rare - then she will have intermittent random clear rhinorrhea.  She is not aware of PND but she feels that is likely having.  Patient Active Problem List   Diagnosis Date Noted  . 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 Abdominal pain, right lower quadrant 07/01/2014  . IBS (irritable bowel syndrome) 06/03/2014  . Anxiety 03/09/2014  . Obesity (BMI 30-39.9) 10/27/2013  . Neck pain on left side 09/02/2013  . Cervical radiculopathy 08/18/2013  . Chronic pain disorder 08/16/2013  . Drug-seeking behavior 08/16/2013  . Muscle spasm of left shoulder area 08/16/2013  . Lumbar back pain 11/21/2012  . Esophageal stricture 11/21/2012  . ADD (attention deficit disorder) 11/21/2012  . Hypertriglyceridemia 11/21/2012  . Pain 08/06/2012  . Carpal tunnel syndrome, bilateral 07/05/2012  . OSA (obstructive sleep apnea) 06/28/2012  . Constipation 01/01/2012  . Migraine headache 03/02/2011  . Depression 03/02/2011  . History of substance abuse 03/02/2011  . Hypertension 03/02/2011  . Abdominal pain, epigastric 02/20/2011    Current Outpatient Prescriptions on File Prior to Visit  Medication Sig Dispense Refill  . acetaminophen (TYLENOL) 500 MG tablet Take 1,000 mg by mouth every 6 (six) hours as needed for moderate pain.    Marland Kitchen ALPRAZolam (XANAX) 2 MG tablet Take 1/2 tablet (1 mg) as needed Morning and mid-day; take 1.5 tablets (3 mg) at bedtime as needed. 75 tablet 0  . amLODipine (NORVASC) 10 MG tablet Take 1 tablet (10 mg total) by mouth at bedtime.  90 tablet 1  . amphetamine-dextroamphetamine (ADDERALL) 15 MG tablet Take 1 tablet by mouth 3 (three) times daily. 90 tablet 0  . amphetamine-dextroamphetamine (ADDERALL) 30 MG tablet Take 0.5 tablets by mouth 3 (three) times daily. 45 tablet 0  . ARIPiprazole (ABILIFY) 10 MG tablet Take 1 tablet (10 mg total) by mouth daily. 90 tablet 1  . EPINEPHrine (EPIPEN) 0.3 mg/0.3 mL IJ SOAJ injection Inject 0.3 mLs (0.3 mg total) into the muscle once as needed (Anaphylaxis). 1 Device 2  . ibuprofen (ADVIL,MOTRIN) 200 MG tablet Take 600 mg by mouth 2 (two) times daily as needed for pain.    Marland Kitchen neomycin-polymyxin-hydrocortisone (CORTISPORIN) otic solution Place 3 drops into both ears 4 (four) times daily. 10 mL 0  . ondansetron (ZOFRAN-ODT) 8 MG disintegrating tablet DISSOLVE 1 TABLET BY MOUTH EVERY 8 HOURS AS NEEDED FOR NAUSEA 90 tablet 1  . Oxycodone HCl 20 MG TABS Take 2.5 tablets (50 mg total) by mouth every 4 (four) hours. 225 tablet 0  . Oxycodone HCl 20 MG TABS Take 2.5 tablets (50 mg total) by mouth every 4 (four) hours. 225 tablet 0  . pantoprazole (PROTONIX) 40 MG tablet Take 1 tablet (40 mg total) by mouth at bedtime. 90 tablet 1  . sertraline (ZOLOFT) 100 MG tablet Take 1 tablet (100 mg total) by mouth daily. 30 tablet 5  . valACYclovir (VALTREX) 1000 MG tablet Take 1 tablet (1,000 mg total) by mouth daily. 90 tablet 3  .  cyclobenzaprine (FLEXERIL) 10 MG tablet Take 1 tablet (10 mg total) by mouth 2 (two) times daily as needed for muscle spasms. (Patient not taking: Reported on 05/23/2015) 14 tablet 0   No current facility-administered medications on file prior to visit.    Allergies  Allergen Reactions  . Imitrex [Sumatriptan Base] Shortness Of Breath and Other (See Comments)    TACHYCARDIA  . Trazodone And Nefazodone Other (See Comments)    NIGHTMARES  . Wellbutrin [Bupropion] Other (See Comments)    "crawling out of my skin"  . Effexor [Venlafaxine] Other (See Comments)    MAKES HER  "JITTERY"  . Metoclopramide Hcl Itching  . Morphine And Related Itching  . Tegretol [Carbamazepine] Itching  . Topamax Itching    Review of Systems  Constitutional: Positive for chills and fatigue. Negative for fever.  HENT: Positive for ear pain (right ear), postnasal drip, rhinorrhea (clear) and sore throat (same all through the day). Negative for congestion.   Respiratory: Negative for cough.   Musculoskeletal: Negative for myalgias.  Neurological: Positive for headaches.  Psychiatric/Behavioral: Positive for sleep disturbance (right ear pain reducing sleep).   Objective:  BP 122/82 mmHg  Pulse 107  Temp(Src) 97.8 F (36.6 C) (Oral)  Resp 16  Ht 5' 6.5" (1.689 m)  Wt 197 lb (89.359 kg)  BMI 31.32 kg/m2  SpO2 98%  Physical Exam  Constitutional: She is oriented to person, place, and time and well-developed, well-nourished, and in no distress.  HENT:  Head: Normocephalic and atraumatic.  Right Ear: Hearing, tympanic membrane, external ear and ear canal normal.  Left Ear: Hearing and tympanic membrane normal. There is swelling.  Nose: Nose normal. No mucosal edema.  Mouth/Throat: Uvula is midline, oropharynx is clear and moist and mucous membranes are normal.  Erythematous swollen external canal  Eyes: Conjunctivae are normal.  Neck: Normal range of motion.  Cardiovascular: Normal rate, regular rhythm and normal heart sounds.   No murmur heard. Pulmonary/Chest: Effort normal and breath sounds normal.  Neurological: She is alert and oriented to person, place, and time. Gait normal.  Skin: Skin is warm and dry.  Psychiatric: Mood, memory, affect and judgment normal.  Vitals reviewed.   Assessment and Plan :  Otitis, externa, infective, right - Plan: ciprofloxacin-hydrocortisone (CIPRO HC) otic suspension  Constipation, unspecified constipation type - Plan: lubiprostone (AMITIZA) 24 MCG capsule  Pt should use mucinex to help with congestion - and motrin for the sore  throat  Benny Lennert PA-C  [facility] 07/06/2015 2:34 PM

## 2015-07-13 ENCOUNTER — Ambulatory Visit (INDEPENDENT_AMBULATORY_CARE_PROVIDER_SITE_OTHER): Payer: Commercial Managed Care - PPO | Admitting: Physician Assistant

## 2015-07-13 VITALS — BP 114/76 | HR 86 | Temp 98.9°F | Resp 16 | Ht 66.0 in | Wt 193.0 lb

## 2015-07-13 DIAGNOSIS — Z13228 Encounter for screening for other metabolic disorders: Secondary | ICD-10-CM

## 2015-07-13 DIAGNOSIS — F329 Major depressive disorder, single episode, unspecified: Secondary | ICD-10-CM

## 2015-07-13 DIAGNOSIS — Z130 Encounter for screening for diseases of the blood and blood-forming organs and certain disorders involving the immune mechanism: Secondary | ICD-10-CM | POA: Diagnosis not present

## 2015-07-13 DIAGNOSIS — Z13220 Encounter for screening for lipoid disorders: Secondary | ICD-10-CM

## 2015-07-13 DIAGNOSIS — Z23 Encounter for immunization: Secondary | ICD-10-CM

## 2015-07-13 DIAGNOSIS — F32A Depression, unspecified: Secondary | ICD-10-CM

## 2015-07-13 DIAGNOSIS — Z1329 Encounter for screening for other suspected endocrine disorder: Secondary | ICD-10-CM

## 2015-07-13 DIAGNOSIS — G894 Chronic pain syndrome: Secondary | ICD-10-CM | POA: Diagnosis not present

## 2015-07-13 MED ORDER — OXYCODONE HCL 20 MG PO TABS: 50.0000 mg | ORAL_TABLET | ORAL | Status: DC

## 2015-07-13 MED ORDER — ARIPIPRAZOLE 10 MG PO TABS: 10.0000 mg | ORAL_TABLET | Freq: Every day | ORAL | Status: DC

## 2015-07-13 NOTE — Patient Instructions (Signed)
Come back in for fasting labs at your convenience. The orders are in, so you don't need to see me. Just check in for a "Lab Only" visit.

## 2015-07-13 NOTE — Progress Notes (Signed)
Patient ID: [name], female    DOB: 11-04-1971, 44 y.o.   MRN: [id number]  PCP: Olene Floss  Subjective:   Chief Complaint  Patient presents with  . Medication Refill    Abilify, Oxycodone  . Flu Vaccine    HPI Presents for medication refills and flu vaccine. Her wife would also like her to have labs done to assess for diabetes and hyperlipidemia. She is not fasting today and isn't interested in labs.  She is out of Abilify. X 4-5 days. Feels irritable and angry. This is typical when she runs out of medication. She also needs several other of her chronic medications refilled. She's tolerating her meds, without adverse effects, and feels like the doses are right and control her pain and anxiety.    Review of Systems     Patient Active Problem List   Diagnosis Date Noted  . 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 Abdominal pain, right lower quadrant 07/01/2014  . IBS (irritable bowel syndrome) 06/03/2014  . Anxiety 03/09/2014  . Obesity (BMI 30-39.9) 10/27/2013  . Neck pain on left side 09/02/2013  . Cervical radiculopathy 08/18/2013  . Chronic pain disorder 08/16/2013  . Drug-seeking behavior 08/16/2013  . Muscle spasm of left shoulder area 08/16/2013  . Lumbar back pain 11/21/2012  . Esophageal stricture 11/21/2012  . ADD (attention deficit disorder) 11/21/2012  . Hypertriglyceridemia 11/21/2012  . Pain 08/06/2012  . Carpal tunnel syndrome, bilateral 07/05/2012  . OSA (obstructive sleep apnea) 06/28/2012  . Constipation 01/01/2012  . Migraine headache 03/02/2011  . Depression 03/02/2011  . History of substance abuse 03/02/2011  . Hypertension 03/02/2011  . Abdominal pain, epigastric 02/20/2011     Prior to Admission medications   Medication Sig Start Date End Date Taking? Authorizing Provider  acetaminophen (TYLENOL) 500 MG tablet Take 1,000 mg by mouth every 6 (six) hours as needed for moderate pain.   Yes Historical Provider, MD  ALPRAZolam Prudy Feeler) 2 MG  tablet Take 1/2 tablet (1 mg) as needed Morning and mid-day; take 1.5 tablets (3 mg) at bedtime as needed. 06/25/15  Yes Crandall Harvel, PA-C  amLODipine (NORVASC) 10 MG tablet Take 1 tablet (10 mg total) by mouth at bedtime. 05/23/15  Yes Horatio Bertz, PA-C  amphetamine-dextroamphetamine (ADDERALL) 15 MG tablet Take 1 tablet by mouth 3 (three) times daily. 06/16/15  Yes Morrell Riddle, PA-C  amphetamine-dextroamphetamine (ADDERALL) 30 MG tablet Take 0.5 tablets by mouth 3 (three) times daily. 03/24/15  Yes Cheryll Keisler, PA-C  ARIPiprazole (ABILIFY) 10 MG tablet Take 1 tablet (10 mg total) by mouth daily. 04/06/15  Yes Beni Turrell, PA-C  ciprofloxacin-hydrocortisone (CIPRO HC) otic suspension Place 3 drops into the right ear 2 (two) times daily. 07/06/15  Yes Morrell Riddle, PA-C  EPINEPHrine (EPIPEN) 0.3 mg/0.3 mL IJ SOAJ injection Inject 0.3 mLs (0.3 mg total) into the muscle once as needed (Anaphylaxis). 07/01/14  Yes Jilda Kress, PA-C  ibuprofen (ADVIL,MOTRIN) 200 MG tablet Take 600 mg by mouth 2 (two) times daily as needed for pain.   Yes Historical Provider, MD  lubiprostone (AMITIZA) 24 MCG capsule Take 1 capsule (24 mcg total) by mouth 2 (two) times daily with a meal. 07/06/15  Yes Morrell Riddle, PA-C  neomycin-polymyxin-hydrocortisone (CORTISPORIN) otic solution Place 3 drops into both ears 4 (four) times daily. 01/22/15  Yes Ofilia Neas, PA-C  ondansetron (ZOFRAN-ODT) 8 MG disintegrating tablet DISSOLVE 1 TABLET BY MOUTH EVERY 8 HOURS AS NEEDED FOR NAUSEA 01/21/15  Yes Silva Bandy M  Katrinka Blazing, MD  Oxycodone HCl 20 MG TABS Take 2.5 tablets (50 mg total) by mouth every 4 (four) hours. 06/16/15  Yes Morrell Riddle, PA-C  Oxycodone HCl 20 MG TABS Take 2.5 tablets (50 mg total) by mouth every 4 (four) hours. 06/16/15  Yes Morrell Riddle, PA-C  pantoprazole (PROTONIX) 40 MG tablet Take 1 tablet (40 mg total) by mouth at bedtime. 06/16/15  Yes Morrell Riddle, PA-C  sertraline (ZOLOFT) 100 MG tablet Take 1 tablet  (100 mg total) by mouth daily. 06/16/15  Yes Morrell Riddle, PA-C  valACYclovir (VALTREX) 1000 MG tablet Take 1 tablet (1,000 mg total) by mouth daily. 06/16/15  Yes Morrell Riddle, PA-C     Allergies  Allergen Reactions  . Imitrex [Sumatriptan Base] Shortness Of Breath and Other (See Comments)    TACHYCARDIA  . Trazodone And Nefazodone Other (See Comments)    NIGHTMARES  . Wellbutrin [Bupropion] Other (See Comments)    "crawling out of my skin"  . Effexor [Venlafaxine] Other (See Comments)    MAKES HER "JITTERY"  . Metoclopramide Hcl Itching  . Morphine And Related Itching  . Tegretol [Carbamazepine] Itching  . Topamax Itching       Objective:  Physical Exam  Constitutional: She is oriented to person, place, and time. She appears well-developed and well-nourished. No distress.  BP 114/76 mmHg  Pulse 86  Temp(Src) 98.9 F (37.2 C) (Oral)  Resp 16  Ht 5\' 6"  (1.676 m)  Wt 193 lb (87.544 kg)  BMI 31.17 kg/m2  SpO2 98%   Eyes: Conjunctivae are normal. No scleral icterus.  Neck: No thyromegaly present.  Cardiovascular: Normal rate, regular rhythm, normal heart sounds and intact distal pulses.   Pulmonary/Chest: Effort normal and breath sounds normal.  Lymphadenopathy:    She has no cervical adenopathy.  Neurological: She is alert and oriented to person, place, and time.  Skin: Skin is warm and dry.  Psychiatric: She has a normal mood and affect. Her behavior is normal.           Assessment & Plan:   1. Chronic pain disorder Stable. Controlled on current dose. - Oxycodone HCl 20 MG TABS; Take 2.5 tablets (50 mg total) by mouth every 4 (four) hours.  Dispense: 225 tablet; Refill: 0 - Oxycodone HCl 20 MG TABS; Take 2.5 tablets (50 mg total) by mouth every 4 (four) hours.  Dispense: 225 tablet; Refill: 0  2. Depression Resume Abilify. Controlled when on medication. - ARIPiprazole (ABILIFY) 10 MG tablet; Take 1 tablet (10 mg total) by mouth daily.  Dispense: 90 tablet;  Refill: 1  3. Need for influenza vaccination - Flu Vaccine QUAD 36+ mos IM  4. Screening for deficiency anemia - CBC with Differential/Platelet; Future  5. Screening for metabolic disorder - Comprehensive metabolic panel; Future  6. Screening for hyperlipidemia - Lipid panel; Future  7. Screening for thyroid disorder - TSH; Future  She will return at her convenience for fasting labs.   [name], PA-C Physician Assistant-Certified [facility]

## 2015-07-19 ENCOUNTER — Encounter: Payer: Self-pay | Admitting: Physician Assistant

## 2015-07-22 ENCOUNTER — Encounter: Payer: Self-pay | Admitting: Physician Assistant

## 2015-07-22 DIAGNOSIS — G4700 Insomnia, unspecified: Secondary | ICD-10-CM

## 2015-07-22 DIAGNOSIS — F419 Anxiety disorder, unspecified: Secondary | ICD-10-CM

## 2015-07-23 ENCOUNTER — Encounter: Payer: Self-pay | Admitting: Physician Assistant

## 2015-07-23 MED ORDER — ALPRAZOLAM 2 MG PO TABS: ORAL_TABLET | ORAL | Status: DC

## 2015-07-23 NOTE — Telephone Encounter (Signed)
Patient notified via My Chart.  Meds ordered this encounter  Medications  . alprazolam (XANAX) 2 MG tablet    Sig: Take 1/2 tablet (1 mg) as needed Morning and mid-day; take 1.5 tablets (3 mg) at bedtime as needed.    Dispense:  75 tablet    Refill:  0    Order Specific Question:  Supervising Provider    Answer:  DOOLITTLE, ROBERT P [[id number]]

## 2015-07-23 NOTE — Telephone Encounter (Signed)
Pt would prefer this script sent by her pharmacy-[facility] on [address] Please advise [phone]

## 2015-07-26 ENCOUNTER — Telehealth: Payer: Self-pay | Admitting: Physician Assistant

## 2015-07-26 ENCOUNTER — Ambulatory Visit (INDEPENDENT_AMBULATORY_CARE_PROVIDER_SITE_OTHER): Payer: Commercial Managed Care - PPO | Admitting: Physician Assistant

## 2015-07-26 VITALS — BP 124/88 | HR 115 | Temp 98.0°F | Resp 16 | Ht 66.0 in | Wt 194.0 lb

## 2015-07-26 DIAGNOSIS — G894 Chronic pain syndrome: Secondary | ICD-10-CM | POA: Diagnosis not present

## 2015-07-26 MED ORDER — OXYCODONE HCL ER 60 MG PO T12A: 60.0000 mg | EXTENDED_RELEASE_TABLET | Freq: Two times a day (BID) | ORAL | Status: DC

## 2015-07-26 NOTE — Progress Notes (Signed)
Patient ID: [name], female    DOB: 10/03/1971, 44 y.o.   MRN: [id number]  PCP: Olene Floss  Subjective:   Chief Complaint  Patient presents with  . Medication Refill    follow up/ Chene Kasinger    HPI Presents to discuss medication change .  "I'm wondering if we can go to 60 mg [of Oxycontin] again, but just twice a day.  [name] (her daughter, who has been keeping the patient's medications secure and dispensing the doses at the appointed times) is getting lax, especially on the weekends.  During the week, it's fine. She puts my morning dose out in a bottle.  On the weekends, I have to wake her up. She thinks that I'm coming in and getting the medication when I'm not, and she isn't always there when I need to take the next dose." The patient also thinks that [name] may dispense meds or not, depending on whether or not she is mad at the patient. Also, she sometimes spill the pills on the floor, which grosses the patient out.we switched to the IR when we couldn't get the 50 mg tablets in the ER and were having to write multiple prescriptions to achieve the desired dose.  "If we can go back to twice a day, [name] (the patient's wife) can give me the first dose before she goes to work, and then the second dose in the evenings."    At her most recent visit, I provided two prescriptions, each for a 15-day supply. The second Rx is at the pharmacy and can be cancelled.  "I did a stupid thing. I went 4-wheel riding. I wasn't driving, so I couldn't control where we went.  It wasn't good for my back."   Review of Systems Chronic back pain is worse than usual, due to recent activity. No other new complaints.    Patient Active Problem List   Diagnosis Date Noted  . 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 Abdominal pain, right lower quadrant 07/01/2014  . IBS (irritable bowel syndrome) 06/03/2014  . Anxiety 03/09/2014  . Obesity (BMI 30-39.9) 10/27/2013  . Neck pain on left side 09/02/2013  .  Cervical radiculopathy 08/18/2013  . Chronic pain disorder 08/16/2013  . Drug-seeking behavior 08/16/2013  . Muscle spasm of left shoulder area 08/16/2013  . Lumbar back pain 11/21/2012  . Esophageal stricture 11/21/2012  . ADD (attention deficit disorder) 11/21/2012  . Hypertriglyceridemia 11/21/2012  . Pain 08/06/2012  . Carpal tunnel syndrome, bilateral 07/05/2012  . OSA (obstructive sleep apnea) 06/28/2012  . Constipation 01/01/2012  . Migraine headache 03/02/2011  . Depression 03/02/2011  . History of substance abuse 03/02/2011  . Hypertension 03/02/2011  . Abdominal pain, epigastric 02/20/2011     Prior to Admission medications   Medication Sig Start Date End Date Taking? Authorizing Provider  acetaminophen (TYLENOL) 500 MG tablet Take 1,000 mg by mouth every 6 (six) hours as needed for moderate pain.    Historical Provider, MD  alprazolam Prudy Feeler) 2 MG tablet Take 1/2 tablet (1 mg) as needed Morning and mid-day; take 1.5 tablets (3 mg) at bedtime as needed. 07/23/15   Semaja Lymon, PA-C  amLODipine (NORVASC) 10 MG tablet Take 1 tablet (10 mg total) by mouth at bedtime. 05/23/15   Merrilee Ancona, PA-C  amphetamine-dextroamphetamine (ADDERALL) 15 MG tablet Take 1 tablet by mouth 3 (three) times daily. 06/16/15   Morrell Riddle, PA-C  amphetamine-dextroamphetamine (ADDERALL) 30 MG tablet Take 0.5 tablets by mouth 3 (three) times daily.  03/24/15   Jennalynn Rivard, PA-C  ARIPiprazole (ABILIFY) 10 MG tablet Take 1 tablet (10 mg total) by mouth daily. 07/13/15   Fowler Antos, PA-C  ciprofloxacin-hydrocortisone (CIPRO HC) otic suspension Place 3 drops into the right ear 2 (two) times daily. 07/06/15   Morrell Riddle, PA-C  EPINEPHrine (EPIPEN) 0.3 mg/0.3 mL IJ SOAJ injection Inject 0.3 mLs (0.3 mg total) into the muscle once as needed (Anaphylaxis). 07/01/14   Biran Mayberry, PA-C  ibuprofen (ADVIL,MOTRIN) 200 MG tablet Take 600 mg by mouth 2 (two) times daily as needed for pain.    Historical  Provider, MD  lubiprostone (AMITIZA) 24 MCG capsule Take 1 capsule (24 mcg total) by mouth 2 (two) times daily with a meal. 07/06/15   Morrell Riddle, PA-C  ondansetron (ZOFRAN-ODT) 8 MG disintegrating tablet DISSOLVE 1 TABLET BY MOUTH EVERY 8 HOURS AS NEEDED FOR NAUSEA 01/21/15   [name], MD  Oxycodone HCl 20 MG TABS Take 2.5 tablets (50 mg total) by mouth every 4 (four) hours. 07/13/15   Ceylon Arenson, PA-C  Oxycodone HCl 20 MG TABS Take 2.5 tablets (50 mg total) by mouth every 4 (four) hours. 07/13/15   Affan Callow, PA-C  pantoprazole (PROTONIX) 40 MG tablet Take 1 tablet (40 mg total) by mouth at bedtime. 06/16/15   Morrell Riddle, PA-C  sertraline (ZOLOFT) 100 MG tablet Take 1 tablet (100 mg total) by mouth daily. 06/16/15   Morrell Riddle, PA-C  valACYclovir (VALTREX) 1000 MG tablet Take 1 tablet (1,000 mg total) by mouth daily. 06/16/15   Morrell Riddle, PA-C     Allergies  Allergen Reactions  . Imitrex [Sumatriptan Base] Shortness Of Breath and Other (See Comments)    TACHYCARDIA  . Trazodone And Nefazodone Other (See Comments)    NIGHTMARES  . Wellbutrin [Bupropion] Other (See Comments)    "crawling out of my skin"  . Effexor [Venlafaxine] Other (See Comments)    MAKES HER "JITTERY"  . Metoclopramide Hcl Itching  . Morphine And Related Itching  . Tegretol [Carbamazepine] Itching  . Topamax Itching       Objective:  Physical Exam  Constitutional: She is oriented to person, place, and time. She appears well-developed and well-nourished. She is active and cooperative. No distress.  BP 124/88 mmHg  Pulse 115  Temp(Src) 98 F (36.7 C) (Oral)  Resp 16  Ht 5\' 6"  (1.676 m)  Wt 194 lb (87.998 kg)  BMI 31.33 kg/m2  SpO2 99%   Eyes: Conjunctivae are normal.  Pulmonary/Chest: Effort normal.  Neurological: She is alert and oriented to person, place, and time.  Psychiatric: She has a normal mood and affect. Her speech is normal and behavior is normal.           Assessment  & Plan:   1. Chronic pain disorder Spoke with the pharmacist to alert her to the medication change. Did well with controlled release product previously, but wanted to reduce the dose and couldn't achieve it without multiple prescriptions. - OxyCODONE HCl ER 60 MG T12A; Take 60 mg by mouth every 12 (twelve) hours.  Dispense: 60 each; Refill: 0   [name], PA-C Physician Assistant-Certified [facility]

## 2015-07-26 NOTE — Patient Instructions (Signed)
Return at your convenience for the fasting labs!

## 2015-07-26 NOTE — Telephone Encounter (Signed)
Spoke with the pharmacist. Cancel/destroy the prescription they have on file for oxycodone, as she is bringing a new prescription for Oxycontin to fill in its place.

## 2015-08-07 ENCOUNTER — Ambulatory Visit (INDEPENDENT_AMBULATORY_CARE_PROVIDER_SITE_OTHER): Payer: Commercial Managed Care - PPO | Admitting: Physician Assistant

## 2015-08-07 VITALS — BP 132/88 | HR 134 | Temp 98.9°F | Resp 16 | Ht 66.0 in | Wt 193.0 lb

## 2015-08-07 DIAGNOSIS — F988 Other specified behavioral and emotional disorders with onset usually occurring in childhood and adolescence: Secondary | ICD-10-CM

## 2015-08-07 DIAGNOSIS — G894 Chronic pain syndrome: Secondary | ICD-10-CM | POA: Diagnosis not present

## 2015-08-07 DIAGNOSIS — F1193 Opioid use, unspecified with withdrawal: Secondary | ICD-10-CM | POA: Diagnosis not present

## 2015-08-07 DIAGNOSIS — F909 Attention-deficit hyperactivity disorder, unspecified type: Secondary | ICD-10-CM | POA: Diagnosis not present

## 2015-08-07 MED ORDER — AMPHETAMINE-DEXTROAMPHETAMINE 15 MG PO TABS: 15.0000 mg | ORAL_TABLET | Freq: Three times a day (TID) | ORAL | Status: DC

## 2015-08-07 MED ORDER — OXYCODONE HCL ER 60 MG PO T12A: 120.0000 mg | EXTENDED_RELEASE_TABLET | Freq: Two times a day (BID) | ORAL | Status: DC

## 2015-08-07 NOTE — Progress Notes (Signed)
Patient ID: [name], female    DOB: [birth year], 44 y.o.   MRN: [id number]  PCP: Olene Floss  Subjective:   Chief Complaint  Patient presents with  . Medication Refill    HPI Presents for evaluation of withdrawal symptoms.  Also, needs a refill of the adderall.  Experiencing withdrawal symptoms since conversion of oxycodone IR 50 mg Q4 hours to OxyContin 60 mg Q12 hours. In addition, each dose lasts less than 8 hours.  Cold chills, skin crawling, sneezing, irritability, nausea. Pain is also increased, not nearly well controlled. Has had to increase alprazolam to "not kill anyone."  Reviewed conversion recommendations. Based on her total daily dose of oxycodone, she should have been transitioned to 150 mg of OxyContin Q12 hours. Instead, her previous dose of OxyContin was used.    Review of Systems As above.    Patient Active Problem List   Diagnosis Date Noted  . 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 Abdominal pain, right lower quadrant 07/01/2014  . IBS (irritable bowel syndrome) 06/03/2014  . Anxiety 03/09/2014  . Obesity (BMI 30-39.9) 10/27/2013  . Neck pain on left side 09/02/2013  . Cervical radiculopathy 08/18/2013  . Chronic pain disorder 08/16/2013  . Drug-seeking behavior 08/16/2013  . Muscle spasm of left shoulder area 08/16/2013  . Lumbar back pain 11/21/2012  . Esophageal stricture 11/21/2012  . ADD (attention deficit disorder) 11/21/2012  . Hypertriglyceridemia 11/21/2012  . Pain 08/06/2012  . Carpal tunnel syndrome, bilateral 07/05/2012  . OSA (obstructive sleep apnea) 06/28/2012  . Constipation 01/01/2012  . Migraine headache 03/02/2011  . Depression 03/02/2011  . History of substance abuse 03/02/2011  . Hypertension 03/02/2011  . Abdominal pain, epigastric 02/20/2011     Prior to Admission medications   Medication Sig Start Date End Date Taking? Authorizing Provider  acetaminophen (TYLENOL) 500 MG tablet Take 1,000 mg by mouth every 6  (six) hours as needed for moderate pain.    Historical Provider, MD  alprazolam Prudy Feeler) 2 MG tablet Take 1/2 tablet (1 mg) as needed Morning and mid-day; take 1.5 tablets (3 mg) at bedtime as needed. 07/23/15   Fani Rotondo, PA-C  amLODipine (NORVASC) 10 MG tablet Take 1 tablet (10 mg total) by mouth at bedtime. 05/23/15   Jamarea Selner, PA-C  amphetamine-dextroamphetamine (ADDERALL) 15 MG tablet Take 1 tablet by mouth 3 (three) times daily. 06/16/15   Morrell Riddle, PA-C  amphetamine-dextroamphetamine (ADDERALL) 30 MG tablet Take 0.5 tablets by mouth 3 (three) times daily. 03/24/15   Macaila Tahir, PA-C  ARIPiprazole (ABILIFY) 10 MG tablet Take 1 tablet (10 mg total) by mouth daily. 07/13/15   Shomari Scicchitano, PA-C  ciprofloxacin-hydrocortisone (CIPRO HC) otic suspension Place 3 drops into the right ear 2 (two) times daily. 07/06/15   Morrell Riddle, PA-C  EPINEPHrine (EPIPEN) 0.3 mg/0.3 mL IJ SOAJ injection Inject 0.3 mLs (0.3 mg total) into the muscle once as needed (Anaphylaxis). 07/01/14   Shaneeka Scarboro, PA-C  ibuprofen (ADVIL,MOTRIN) 200 MG tablet Take 600 mg by mouth 2 (two) times daily as needed for pain.    Historical Provider, MD  lubiprostone (AMITIZA) 24 MCG capsule Take 1 capsule (24 mcg total) by mouth 2 (two) times daily with a meal. 07/06/15   Morrell Riddle, PA-C  ondansetron (ZOFRAN-ODT) 8 MG disintegrating tablet DISSOLVE 1 TABLET BY MOUTH EVERY 8 HOURS AS NEEDED FOR NAUSEA 01/21/15   [name], MD  OxyCODONE HCl ER 60 MG T12A Take 60 mg by mouth every 12 (  twelve) hours. 07/26/15   Zamire Whitehurst, PA-C  pantoprazole (PROTONIX) 40 MG tablet Take 1 tablet (40 mg total) by mouth at bedtime. 06/16/15   Morrell Riddle, PA-C  sertraline (ZOLOFT) 100 MG tablet Take 1 tablet (100 mg total) by mouth daily. 06/16/15   Morrell Riddle, PA-C  valACYclovir (VALTREX) 1000 MG tablet Take 1 tablet (1,000 mg total) by mouth daily. 06/16/15   Morrell Riddle, PA-C     Allergies  Allergen Reactions  .  Imitrex [Sumatriptan Base] Shortness Of Breath and Other (See Comments)    TACHYCARDIA  . Trazodone And Nefazodone Other (See Comments)    NIGHTMARES  . Wellbutrin [Bupropion] Other (See Comments)    "crawling out of my skin"  . Effexor [Venlafaxine] Other (See Comments)    MAKES HER "JITTERY"  . Metoclopramide Hcl Itching  . Morphine And Related Itching  . Tegretol [Carbamazepine] Itching  . Topamax Itching       Objective:  Physical Exam  Constitutional: She is oriented to person, place, and time. Vital signs are normal. She appears well-developed and well-nourished. She is active and cooperative.  Non-toxic appearance. She has a sickly appearance. No distress.  BP 132/88 mmHg  Pulse 134  Temp(Src) 98.9 F (37.2 C)  Resp 16  Ht 5\' 6"  (1.676 m)  Wt 193 lb (87.544 kg)  BMI 31.17 kg/m2  SpO2 98%  HENT:  Head: Normocephalic and atraumatic.  Right Ear: Hearing normal.  Left Ear: Hearing normal.  Eyes: Conjunctivae are normal. No scleral icterus.  Neck: Normal range of motion. Neck supple. No thyromegaly present.  Cardiovascular: Normal rate, regular rhythm and normal heart sounds.   Pulses:      Radial pulses are 2+ on the right side, and 2+ on the left side.  Pulmonary/Chest: Effort normal and breath sounds normal.  Lymphadenopathy:       Head (right side): No tonsillar, no preauricular, no posterior auricular and no occipital adenopathy present.       Head (left side): No tonsillar, no preauricular, no posterior auricular and no occipital adenopathy present.    She has no cervical adenopathy.       Right: No supraclavicular adenopathy present.       Left: No supraclavicular adenopathy present.  Neurological: She is alert and oriented to person, place, and time. No sensory deficit.  Skin: Skin is warm, dry and intact. No rash noted. No cyanosis or erythema. There is pallor. Nails show no clubbing.  Psychiatric: She has a normal mood and affect. Her speech is normal and  behavior is normal.           Assessment & Plan:   1. Opioid use with withdrawal (HCC) 2. Chronic pain disorder Neither of [place] is comfortable with 150 mg of OxyContin BID. We agree to 120 mg Q12 hours. OK to increase the alprazolam temporarily. She will use up the tablets she has remaining in her bottle (#37) and then fill the new Rx on 08/16/15.  - OxyCODONE HCl ER 60 MG T12A; Take 120 mg by mouth every 12 (twelve) hours.  Dispense: 120 each; Refill: 0  3. ADD (attention deficit disorder) Stable. Continue current dose. - amphetamine-dextroamphetamine (ADDERALL) 15 MG tablet; Take 1 tablet by mouth 3 (three) times daily.  Dispense: 90 tablet; Refill: 0   [name], PA-C Physician Assistant-Certified [facility]

## 2015-08-17 ENCOUNTER — Encounter: Payer: Self-pay | Admitting: Physician Assistant

## 2015-08-17 DIAGNOSIS — G4700 Insomnia, unspecified: Secondary | ICD-10-CM

## 2015-08-17 DIAGNOSIS — F419 Anxiety disorder, unspecified: Secondary | ICD-10-CM

## 2015-08-19 ENCOUNTER — Encounter: Payer: Self-pay | Admitting: Physician Assistant

## 2015-08-20 MED ORDER — ALPRAZOLAM 2 MG PO TABS: ORAL_TABLET | ORAL | Status: DC

## 2015-08-20 NOTE — Telephone Encounter (Signed)
Patient notified via My Chart.  Meds ordered this encounter  Medications  . alprazolam (XANAX) 2 MG tablet    Sig: Take 1/2 tablet (1 mg) as needed Morning and mid-day; take 1.5 tablets (3 mg) at bedtime as needed.    Dispense:  75 tablet    Refill:  0    Patient ran out because she was instructed to increase her dose while pain medications were adjusted.    Order Specific Question:  Supervising Provider    Answer:  Ellamae SiaOLITTLE, ROBERT P [[id number]]

## 2015-09-08 ENCOUNTER — Encounter: Payer: Self-pay | Admitting: Physician Assistant

## 2015-09-09 ENCOUNTER — Encounter: Payer: Self-pay | Admitting: Physician Assistant

## 2015-09-09 DIAGNOSIS — G894 Chronic pain syndrome: Secondary | ICD-10-CM

## 2015-09-09 MED ORDER — OXYCODONE HCL ER 60 MG PO T12A: 120.0000 mg | EXTENDED_RELEASE_TABLET | Freq: Two times a day (BID) | ORAL | Status: DC

## 2015-09-09 NOTE — Telephone Encounter (Signed)
Patient notified via My Chart.  Meds ordered this encounter  Medications  . oxyCODONE (OXYCONTIN) 60 MG 12 hr tablet    Sig: Take 120 mg by mouth every 12 (twelve) hours.    Dispense:  120 each    Refill:  0    To fill 09/14/2015    Order Specific Question:  Supervising Provider    Answer:  Ellamae SiaOLITTLE, ROBERT P [[id number]]

## 2015-09-13 ENCOUNTER — Encounter: Payer: Self-pay | Admitting: Physician Assistant

## 2015-09-13 DIAGNOSIS — F988 Other specified behavioral and emotional disorders with onset usually occurring in childhood and adolescence: Secondary | ICD-10-CM

## 2015-09-14 ENCOUNTER — Encounter: Payer: Self-pay | Admitting: Physician Assistant

## 2015-09-14 DIAGNOSIS — F419 Anxiety disorder, unspecified: Secondary | ICD-10-CM

## 2015-09-14 DIAGNOSIS — G4700 Insomnia, unspecified: Secondary | ICD-10-CM

## 2015-09-14 MED ORDER — AMPHETAMINE-DEXTROAMPHETAMINE 15 MG PO TABS: 15.0000 mg | ORAL_TABLET | Freq: Three times a day (TID) | ORAL | Status: DC

## 2015-09-14 MED ORDER — ALPRAZOLAM 2 MG PO TABS: ORAL_TABLET | ORAL | Status: DC

## 2015-09-14 NOTE — Telephone Encounter (Signed)
Rx printed at 104. Will bring to 102 after clinic.  Meds ordered this encounter  Medications  . amphetamine-dextroamphetamine (ADDERALL) 15 MG tablet    Sig: Take 1 tablet by mouth 3 (three) times daily.    Dispense:  90 tablet    Refill:  0    Order Specific Question:  Supervising Provider    Answer:  DOOLITTLE, ROBERT P [[id number]]

## 2015-09-14 NOTE — Telephone Encounter (Signed)
Rx printed at 104. Will bring to 102 after clinic.  Meds ordered this encounter  Medications  . alprazolam (XANAX) 2 MG tablet    Sig: Take 1/2 tablet (1 mg) as needed Morning and mid-day; take 1.5 tablets (3 mg) at bedtime as needed.    Dispense:  75 tablet    Refill:  0    Patient ran out because she was instructed to increase her dose while pain medications were adjusted.    Order Specific Question:  Supervising Provider    Answer:  Tonye PearsonOLITTLE, ROBERT P [[id number]]    Patient notified via My Chart.

## 2015-10-11 ENCOUNTER — Encounter: Payer: Self-pay | Admitting: Physician Assistant

## 2015-10-11 DIAGNOSIS — F419 Anxiety disorder, unspecified: Secondary | ICD-10-CM

## 2015-10-11 DIAGNOSIS — G894 Chronic pain syndrome: Secondary | ICD-10-CM

## 2015-10-11 DIAGNOSIS — G4700 Insomnia, unspecified: Secondary | ICD-10-CM

## 2015-10-12 ENCOUNTER — Encounter: Payer: Self-pay | Admitting: Physician Assistant

## 2015-10-12 MED ORDER — OXYCODONE HCL ER 60 MG PO T12A: 120.0000 mg | EXTENDED_RELEASE_TABLET | Freq: Two times a day (BID) | ORAL | Status: DC

## 2015-10-12 MED ORDER — ALPRAZOLAM 2 MG PO TABS: ORAL_TABLET | ORAL | Status: DC

## 2015-10-12 NOTE — Telephone Encounter (Signed)
Patient notified via My Chart.  Rx printed at 104. Will bring to 102 after clinic.  Meds ordered this encounter  Medications  . oxyCODONE (OXYCONTIN) 60 MG 12 hr tablet    Sig: Take 120 mg by mouth every 12 (twelve) hours.    Dispense:  120 each    Refill:  0    To fill 09/14/2015    Order Specific Question:  Supervising Provider    Answer:  Merla RichesOLITTLE, ROBERT P [[id number]]  . alprazolam (XANAX) 2 MG tablet    Sig: Take 1/2 tablet (1 mg) as needed Morning and mid-day; take 1.5 tablets (3 mg) at bedtime as needed.    Dispense:  75 tablet    Refill:  0    Patient ran out because she was instructed to increase her dose while pain medications were adjusted.    Order Specific Question:  Supervising Provider    Answer:  Ellamae SiaOLITTLE, ROBERT P [[id number]]

## 2015-10-14 ENCOUNTER — Encounter: Payer: Self-pay | Admitting: Physician Assistant

## 2015-10-15 ENCOUNTER — Telehealth: Payer: Self-pay

## 2015-10-15 DIAGNOSIS — F988 Other specified behavioral and emotional disorders with onset usually occurring in childhood and adolescence: Secondary | ICD-10-CM

## 2015-10-15 MED ORDER — AMPHETAMINE-DEXTROAMPHETAMINE 15 MG PO TABS: 15.0000 mg | ORAL_TABLET | Freq: Three times a day (TID) | ORAL | Status: DC

## 2015-10-15 NOTE — Telephone Encounter (Signed)
Done

## 2015-10-15 NOTE — Telephone Encounter (Signed)
This is directed to [name]-pt needs a refill on her amphetamine-dextroamphetamine (ADDERALL) 15 MG tablet [[id number]][[id number]]. Please advise at [phone]

## 2015-10-15 NOTE — Telephone Encounter (Signed)
This is [name]'s pt so I am confused. She may need her Rx today and I have not seen [name].

## 2015-10-17 ENCOUNTER — Other Ambulatory Visit (INDEPENDENT_AMBULATORY_CARE_PROVIDER_SITE_OTHER): Payer: Commercial Managed Care - PPO | Admitting: *Deleted

## 2015-10-17 DIAGNOSIS — Z13220 Encounter for screening for lipoid disorders: Secondary | ICD-10-CM

## 2015-10-17 DIAGNOSIS — Z130 Encounter for screening for diseases of the blood and blood-forming organs and certain disorders involving the immune mechanism: Secondary | ICD-10-CM

## 2015-10-17 DIAGNOSIS — Z1329 Encounter for screening for other suspected endocrine disorder: Secondary | ICD-10-CM

## 2015-10-17 DIAGNOSIS — Z13228 Encounter for screening for other metabolic disorders: Secondary | ICD-10-CM

## 2015-10-17 LAB — COMPREHENSIVE METABOLIC PANEL
ALBUMIN: 4.3 g/dL (ref 3.6–5.1)
ALT: 13 U/L (ref 6–29)
AST: 16 U/L (ref 10–35)
Alkaline Phosphatase: 88 U/L (ref 33–115)
BILIRUBIN TOTAL: 0.3 mg/dL (ref 0.2–1.2)
BUN: 9 mg/dL (ref 7–25)
CALCIUM: 9.6 mg/dL (ref 8.6–10.2)
CHLORIDE: 99 mmol/L (ref 98–110)
CO2: 29 mmol/L (ref 20–31)
Creat: 0.69 mg/dL (ref 0.50–1.10)
GLUCOSE: 101 mg/dL — AB (ref 65–99)
POTASSIUM: 4.2 mmol/L (ref 3.5–5.3)
Sodium: 136 mmol/L (ref 135–146)
Total Protein: 7.1 g/dL (ref 6.1–8.1)

## 2015-10-17 LAB — CBC WITH DIFFERENTIAL/PLATELET
Basophils Absolute: 0.1 10*3/uL (ref 0.0–0.1)
Basophils Relative: 1 % (ref 0–1)
EOS PCT: 5 % (ref 0–5)
Eosinophils Absolute: 0.3 10*3/uL (ref 0.0–0.7)
HEMATOCRIT: 41.2 % (ref 36.0–46.0)
HEMOGLOBIN: 13.5 g/dL (ref 12.0–15.0)
LYMPHS PCT: 40 % (ref 12–46)
Lymphs Abs: 2.6 10*3/uL (ref 0.7–4.0)
MCH: 28.2 pg (ref 26.0–34.0)
MCHC: 32.8 g/dL (ref 30.0–36.0)
MCV: 86.2 fL (ref 78.0–100.0)
MONO ABS: 0.3 10*3/uL (ref 0.1–1.0)
MONOS PCT: 4 % (ref 3–12)
MPV: 8.7 fL (ref 8.6–12.4)
NEUTROS ABS: 3.3 10*3/uL (ref 1.7–7.7)
Neutrophils Relative %: 50 % (ref 43–77)
Platelets: 335 10*3/uL (ref 150–400)
RBC: 4.78 MIL/uL (ref 3.87–5.11)
RDW: 13.4 % (ref 11.5–15.5)
WBC: 6.5 10*3/uL (ref 4.0–10.5)

## 2015-10-17 LAB — LIPID PANEL
CHOL/HDL RATIO: 10 ratio — AB (ref <=5.0)
Cholesterol: 199 mg/dL (ref 125–200)
HDL: 20 mg/dL — AB (ref >=46)
LDL CALC: 117 mg/dL (ref <130)
TRIGLYCERIDES: 310 mg/dL — AB (ref <150)
VLDL: 62 mg/dL — AB (ref <30)

## 2015-10-17 NOTE — Progress Notes (Signed)
LAB ONLY 

## 2015-10-18 LAB — TSH: TSH: 2.473 u[IU]/mL (ref 0.350–4.500)

## 2015-11-08 ENCOUNTER — Encounter: Payer: Self-pay | Admitting: Physician Assistant

## 2015-11-08 DIAGNOSIS — F988 Other specified behavioral and emotional disorders with onset usually occurring in childhood and adolescence: Secondary | ICD-10-CM

## 2015-11-08 DIAGNOSIS — F419 Anxiety disorder, unspecified: Secondary | ICD-10-CM

## 2015-11-08 DIAGNOSIS — G894 Chronic pain syndrome: Secondary | ICD-10-CM

## 2015-11-08 DIAGNOSIS — G4700 Insomnia, unspecified: Secondary | ICD-10-CM

## 2015-11-08 MED ORDER — OXYCODONE HCL ER 60 MG PO T12A: 120.0000 mg | EXTENDED_RELEASE_TABLET | Freq: Two times a day (BID) | ORAL | Status: DC

## 2015-11-08 MED ORDER — AMPHETAMINE-DEXTROAMPHETAMINE 15 MG PO TABS: 15.0000 mg | ORAL_TABLET | Freq: Three times a day (TID) | ORAL | Status: DC

## 2015-11-08 MED ORDER — ALPRAZOLAM 2 MG PO TABS: ORAL_TABLET | ORAL | Status: DC

## 2015-11-08 NOTE — Telephone Encounter (Signed)
Patient notified via My Chart.  Meds ordered this encounter  Medications  . alprazolam (XANAX) 2 MG tablet    Sig: Take 1/2 tablet (1 mg) as needed Morning and mid-day; take 1.5 tablets (3 mg) at bedtime as needed.    Dispense:  75 tablet    Refill:  0    Order Specific Question:  Supervising Provider    Answer:  DOOLITTLE, ROBERT P [[id number]]  . amphetamine-dextroamphetamine (ADDERALL) 15 MG tablet    Sig: Take 1 tablet by mouth 3 (three) times daily.    Dispense:  90 tablet    Refill:  0    Order Specific Question:  Supervising Provider    Answer:  DOOLITTLE, ROBERT P [[id number]]  . oxyCODONE (OXYCONTIN) 60 MG 12 hr tablet    Sig: Take 120 mg by mouth every 12 (twelve) hours.    Dispense:  120 each    Refill:  0    Order Specific Question:  Supervising Provider    Answer:  DOOLITTLE, ROBERT P [[id number]]

## 2015-11-09 NOTE — Telephone Encounter (Signed)
Rxs in drawer. 

## 2015-11-17 ENCOUNTER — Ambulatory Visit (INDEPENDENT_AMBULATORY_CARE_PROVIDER_SITE_OTHER): Payer: Commercial Managed Care - PPO | Admitting: Emergency Medicine

## 2015-11-17 ENCOUNTER — Ambulatory Visit (INDEPENDENT_AMBULATORY_CARE_PROVIDER_SITE_OTHER): Payer: Commercial Managed Care - PPO

## 2015-11-17 VITALS — BP 100/88 | HR 79 | Temp 98.7°F | Ht 65.75 in | Wt 194.0 lb

## 2015-11-17 DIAGNOSIS — M25512 Pain in left shoulder: Secondary | ICD-10-CM | POA: Diagnosis not present

## 2015-11-17 MED ORDER — NAPROXEN SODIUM 550 MG PO TABS: 550.0000 mg | ORAL_TABLET | Freq: Two times a day (BID) | ORAL | Status: AC

## 2015-11-17 NOTE — Progress Notes (Signed)
Subjective:  Patient ID: [name], female    DOB: 02-Jan-1971  Age: 45 y.o. MRN: [id number]  CC: Shoulder Injury and Shoulder Pain   HPI [name] presents  patient was attempting to jump over a stream lost [name] footing and fell landing on [name] left shoulder. This occurred about a month ago. Since that time she's had limited use of [name] arm. She is unable to get dressed without some difficulty. She has had difficulty with abduction. She has no treatment for the injury.  History [name] has a past medical history of Anxiety; GERD (gastroesophageal reflux disease); History of alcohol abuse; Migraine headache; History of cocaine abuse; History of kidney stones; Arthritis; Depression; Complication of anesthesia (03/2011); History of esophageal dilatation; History of pancreatitis (2012); Hypertension; Medial meniscus tear (07/2013); Cervical muscle strain (07/2013); Dental crowns present; Genital herpes simplex type 2; Herniated disc, cervical; Substance abuse; and Sleep apnea.   She has past surgical history that includes Abdominal hysterectomy; Cesarean section; Nasal septum surgery; Carpal tunnel release (07/29/2012); laparoscopic appendectomy (02/21/2006); Laparoscopic lysis of adhesions (02/21/2006); Cholecystectomy (04/05/2010); ERCP w/ metal stent placement (03/14/2011); Cystoscopy w/ ureteral stent placement (Right, 06/11/2007); Nasal septoplasty w/ turbinoplasty (Bilateral, 07/26/2009); Knee arthroscopy (Left); Knee arthroscopy with medial menisectomy (Right, 07/31/2013); Appendectomy; Colonoscopy; ERCP; and Upper gastrointestinal endoscopy.   [name]  family history includes Alcohol abuse in [name] maternal uncle and mother; Cancer in [name] mother; Heart disease in [name] maternal grandmother and maternal uncle; Hypertension in [name] mother; Kidney cancer in [name] mother; Lung cancer in [name] maternal aunt; Stroke (age of onset: 5055) in [name] maternal uncle. There is no history of Colon cancer, Prostate cancer, Esophageal cancer,  Pancreatic cancer, Rectal cancer, or Stomach cancer.  She   reports that she quit smoking about 2 years ago. She has never used smokeless tobacco. She reports that she does not drink alcohol or use illicit drugs.  Outpatient Prescriptions Prior to Visit  Medication Sig Dispense Refill  . acetaminophen (TYLENOL) 500 MG tablet Take 1,000 mg by mouth every 6 (six) hours as needed for moderate pain.    Marland Kitchen. alprazolam (XANAX) 2 MG tablet Take 1/2 tablet (1 mg) as needed Morning and mid-day; take 1.5 tablets (3 mg) at bedtime as needed. 75 tablet 0  . amLODipine (NORVASC) 10 MG tablet Take 1 tablet (10 mg total) by mouth at bedtime. 90 tablet 1  . amphetamine-dextroamphetamine (ADDERALL) 15 MG tablet Take 1 tablet by mouth 3 (three) times daily. 90 tablet 0  . ARIPiprazole (ABILIFY) 10 MG tablet Take 1 tablet (10 mg total) by mouth daily. 90 tablet 1  . ciprofloxacin-hydrocortisone (CIPRO HC) otic suspension Place 3 drops into the right ear 2 (two) times daily. 10 mL 0  . EPINEPHrine (EPIPEN) 0.3 mg/0.3 mL IJ SOAJ injection Inject 0.3 mLs (0.3 mg total) into the muscle once as needed (Anaphylaxis). 1 Device 2  . ibuprofen (ADVIL,MOTRIN) 200 MG tablet Take 600 mg by mouth 2 (two) times daily as needed for pain.    Marland Kitchen. lubiprostone (AMITIZA) 24 MCG capsule Take 1 capsule (24 mcg total) by mouth 2 (two) times daily with a meal. 180 capsule 3  . ondansetron (ZOFRAN-ODT) 8 MG disintegrating tablet DISSOLVE 1 TABLET BY MOUTH EVERY 8 HOURS AS NEEDED FOR NAUSEA 90 tablet 1  . oxyCODONE (OXYCONTIN) 60 MG 12 hr tablet Take 120 mg by mouth every 12 (twelve) hours. 120 each 0  . pantoprazole (PROTONIX) 40 MG tablet Take 1 tablet (40 mg total) by mouth  at bedtime. 90 tablet 1  . sertraline (ZOLOFT) 100 MG tablet Take 1 tablet (100 mg total) by mouth daily. 30 tablet 5  . valACYclovir (VALTREX) 1000 MG tablet Take 1 tablet (1,000 mg total) by mouth daily. 90 tablet 3   No facility-administered medications prior to  visit.    Social History   Social History  . Marital Status: Significant Other    Spouse Name: Dustin Flock  . Number of Children: 2  . Years of Education: N/A   Occupational History  . CNA    Social History Main Topics  . Smoking status: Former Smoker    Quit date: 06/05/2013  . Smokeless tobacco: Never Used  . Alcohol Use: No     Comment: none since 2004  . Drug Use: No     Comment: none since 2011  . Sexual Activity:    Partners: Female   Other Topics Concern  . None   Social History Narrative   3 caffeine drinks daily       Was recruited to [employer], with a full scholarship, for undergraduate studies, but had to decline due to family problems.  She then planned to attend [employer] ([employer]) but wasn't able to work and attend courses, so went to [employer] and studied Production designer, theatre/television/film.      Has dropped out of the substance abuse counseling program she was enrolled in, having figured out that it's not a career path she wants to continue.  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                    Review of Systems  Constitutional: Negative for fever, chills and appetite change.  HENT: Negative for congestion, ear pain, postnasal drip, sinus pressure and sore throat.   Eyes: Negative for pain and redness.  Respiratory: Negative for cough, shortness of breath and wheezing.   Cardiovascular: Negative for leg swelling.  Gastrointestinal: Negative for nausea, vomiting, abdominal pain, diarrhea, constipation and blood in stool.  Endocrine: Negative for polyuria.  Genitourinary: Negative for dysuria, urgency, frequency and flank pain.  Musculoskeletal: Positive for arthralgias. Negative for gait problem.  Skin: Negative for rash.  Neurological: Negative for weakness and headaches.  Psychiatric/Behavioral: Negative for confusion and decreased concentration. The patient is not nervous/anxious.     Objective:  BP 100/88 mmHg  Pulse 79  Temp(Src) 98.7 F (37.1  C) (Oral)  Ht 5' 5.75" (1.67 m)  Wt 194 lb (87.998 kg)  BMI 31.55 kg/m2  SpO2 96%  Physical Exam  Constitutional: She is oriented to person, place, and time. She appears well-developed and well-nourished.  HENT:  Head: Normocephalic and atraumatic.  Eyes: Conjunctivae are normal. Pupils are equal, round, and reactive to light.  Pulmonary/Chest: Effort normal.  Musculoskeletal: She exhibits no edema.       Left shoulder: She exhibits decreased range of motion, tenderness and decreased strength.  Neurological: She is alert and oriented to person, place, and time.  Skin: Skin is dry.  Psychiatric: She has a normal mood and affect. [name] behavior is normal. Thought content normal.      Assessment & Plan:   [name] was seen today for shoulder injury and shoulder pain.  Diagnoses and all orders for this visit:  Pain in joint of left shoulder -     DG Shoulder Left; Future -     Ambulatory referral to Orthopedic Surgery  Other orders -     naproxen sodium (ANAPROX DS) 550 MG  tablet; Take 1 tablet (550 mg total) by mouth 2 (two) times daily with a meal.  I am having [name] start on naproxen sodium. I am also having [name] maintain [name] ibuprofen, acetaminophen, EPINEPHrine, ondansetron, amLODipine, valACYclovir, pantoprazole, sertraline, ciprofloxacin-hydrocortisone, lubiprostone, ARIPiprazole, alprazolam, amphetamine-dextroamphetamine, and oxyCODONE.  Meds ordered this encounter  Medications  . naproxen sodium (ANAPROX DS) 550 MG tablet    Sig: Take 1 tablet (550 mg total) by mouth 2 (two) times daily with a meal.    Dispense:  40 tablet    Refill:  0    Appropriate red flag conditions were discussed with the patient as well as actions that should be taken.  Patient expressed his understanding.  Follow-up: Return if symptoms worsen or fail to improve.  [name], MD   [facility] reading (PRIMARY) by  Dr. Dareen Piano.  No osseous injury.

## 2015-11-17 NOTE — Patient Instructions (Signed)

## 2015-12-01 ENCOUNTER — Other Ambulatory Visit: Payer: Self-pay | Admitting: Physician Assistant

## 2015-12-07 ENCOUNTER — Encounter: Payer: Self-pay | Admitting: Physician Assistant

## 2015-12-07 DIAGNOSIS — G4700 Insomnia, unspecified: Secondary | ICD-10-CM

## 2015-12-07 DIAGNOSIS — G894 Chronic pain syndrome: Secondary | ICD-10-CM

## 2015-12-07 DIAGNOSIS — F988 Other specified behavioral and emotional disorders with onset usually occurring in childhood and adolescence: Secondary | ICD-10-CM

## 2015-12-07 DIAGNOSIS — F419 Anxiety disorder, unspecified: Secondary | ICD-10-CM

## 2015-12-08 MED ORDER — ALPRAZOLAM 2 MG PO TABS: ORAL_TABLET | ORAL | Status: DC

## 2015-12-08 MED ORDER — OXYCODONE HCL ER 60 MG PO T12A: 120.0000 mg | EXTENDED_RELEASE_TABLET | Freq: Two times a day (BID) | ORAL | Status: DC

## 2015-12-08 MED ORDER — AMPHETAMINE-DEXTROAMPHETAMINE 15 MG PO TABS: 15.0000 mg | ORAL_TABLET | Freq: Three times a day (TID) | ORAL | Status: DC

## 2015-12-08 NOTE — Telephone Encounter (Signed)
Patient notified via My Chart.  Meds ordered this encounter  Medications  . alprazolam (XANAX) 2 MG tablet    Sig: Take 1/2 tablet (1 mg) as needed Morning and mid-day; take 1.5 tablets (3 mg) at bedtime as needed.    Dispense:  75 tablet    Refill:  0    Order Specific Question:  Supervising Provider    Answer:  DOOLITTLE, ROBERT P [[id number]]  . amphetamine-dextroamphetamine (ADDERALL) 15 MG tablet    Sig: Take 1 tablet by mouth 3 (three) times daily.    Dispense:  90 tablet    Refill:  0    Order Specific Question:  Supervising Provider    Answer:  DOOLITTLE, ROBERT P [[id number]]  . oxyCODONE (OXYCONTIN) 60 MG 12 hr tablet    Sig: Take 120 mg by mouth every 12 (twelve) hours.    Dispense:  120 each    Refill:  0    Order Specific Question:  Supervising Provider    Answer:  DOOLITTLE, ROBERT P [[id number]]

## 2015-12-14 ENCOUNTER — Encounter: Payer: Self-pay | Admitting: Physician Assistant

## 2015-12-14 ENCOUNTER — Other Ambulatory Visit: Payer: Self-pay | Admitting: Physician Assistant

## 2015-12-14 DIAGNOSIS — F32A Depression, unspecified: Secondary | ICD-10-CM

## 2015-12-14 DIAGNOSIS — F329 Major depressive disorder, single episode, unspecified: Secondary | ICD-10-CM

## 2015-12-16 ENCOUNTER — Telehealth: Payer: Self-pay

## 2015-12-16 MED ORDER — SERTRALINE HCL 100 MG PO TABS: 100.0000 mg | ORAL_TABLET | Freq: Every day | ORAL | Status: DC

## 2015-12-16 NOTE — Telephone Encounter (Signed)
Got a call from [facility] advising that this does not need a PA for any reason. They are seeing a reject for cost, but pharmacy must initiate an over ride by calling the pharm help desk. Provider can not do this. Faxed info to pharm.

## 2015-12-16 NOTE — Telephone Encounter (Signed)
Completed PA for Oxycontin 60 mg, 2 tabs BID on covermymeds. Faxed supporting OV notes as required by [facility]. Pending.

## 2016-01-04 ENCOUNTER — Encounter: Payer: Self-pay | Admitting: Physician Assistant

## 2016-01-04 DIAGNOSIS — G894 Chronic pain syndrome: Secondary | ICD-10-CM

## 2016-01-04 DIAGNOSIS — F988 Other specified behavioral and emotional disorders with onset usually occurring in childhood and adolescence: Secondary | ICD-10-CM

## 2016-01-04 DIAGNOSIS — G4700 Insomnia, unspecified: Secondary | ICD-10-CM

## 2016-01-04 DIAGNOSIS — F419 Anxiety disorder, unspecified: Secondary | ICD-10-CM

## 2016-01-05 MED ORDER — AMPHETAMINE-DEXTROAMPHETAMINE 15 MG PO TABS: 15.0000 mg | ORAL_TABLET | Freq: Three times a day (TID) | ORAL | Status: DC

## 2016-01-05 MED ORDER — ALPRAZOLAM 2 MG PO TABS: ORAL_TABLET | ORAL | Status: DC

## 2016-01-05 MED ORDER — OXYCODONE HCL ER 60 MG PO T12A: 120.0000 mg | EXTENDED_RELEASE_TABLET | Freq: Two times a day (BID) | ORAL | Status: DC

## 2016-01-05 NOTE — Telephone Encounter (Signed)
Patient notified via My Chart.  Meds ordered this encounter  Medications  . alprazolam (XANAX) 2 MG tablet    Sig: Take 1/2 tablet (1 mg) as needed Morning and mid-day; take 1.5 tablets (3 mg) at bedtime as needed.    Dispense:  75 tablet    Refill:  0    Order Specific Question:  Supervising Provider    Answer:  DOOLITTLE, ROBERT P [[id number]]  . amphetamine-dextroamphetamine (ADDERALL) 15 MG tablet    Sig: Take 1 tablet by mouth 3 (three) times daily.    Dispense:  90 tablet    Refill:  0    Order Specific Question:  Supervising Provider    Answer:  DOOLITTLE, ROBERT P [[id number]]  . oxyCODONE (OXYCONTIN) 60 MG 12 hr tablet    Sig: Take 120 mg by mouth every 12 (twelve) hours.    Dispense:  120 each    Refill:  0    Order Specific Question:  Supervising Provider    Answer:  DOOLITTLE, ROBERT P [[id number]]

## 2016-01-17 ENCOUNTER — Other Ambulatory Visit: Payer: Self-pay | Admitting: Physician Assistant

## 2016-02-01 ENCOUNTER — Other Ambulatory Visit: Payer: Self-pay | Admitting: Physician Assistant

## 2016-02-01 DIAGNOSIS — G4700 Insomnia, unspecified: Secondary | ICD-10-CM

## 2016-02-01 DIAGNOSIS — F988 Other specified behavioral and emotional disorders with onset usually occurring in childhood and adolescence: Secondary | ICD-10-CM

## 2016-02-01 DIAGNOSIS — G894 Chronic pain syndrome: Secondary | ICD-10-CM

## 2016-02-01 DIAGNOSIS — F419 Anxiety disorder, unspecified: Secondary | ICD-10-CM

## 2016-02-01 MED ORDER — OXYCODONE HCL ER 60 MG PO T12A: 120.0000 mg | EXTENDED_RELEASE_TABLET | Freq: Two times a day (BID) | ORAL | Status: DC

## 2016-02-01 MED ORDER — ALPRAZOLAM 2 MG PO TABS: ORAL_TABLET | ORAL | Status: DC

## 2016-02-01 MED ORDER — AMPHETAMINE-DEXTROAMPHETAMINE 15 MG PO TABS: 15.0000 mg | ORAL_TABLET | Freq: Three times a day (TID) | ORAL | Status: DC

## 2016-02-01 NOTE — Progress Notes (Signed)
Pt needs refills of her controlled substances - they are due 3/1 and wife is here today and would like to pick them up so they can be filled on Friday.

## 2016-02-21 ENCOUNTER — Other Ambulatory Visit: Payer: Self-pay | Admitting: Physician Assistant

## 2016-02-21 ENCOUNTER — Encounter: Payer: Self-pay | Admitting: Physician Assistant

## 2016-02-21 MED ORDER — ARIPIPRAZOLE 10 MG PO TABS: 10.0000 mg | ORAL_TABLET | Freq: Every day | ORAL | Status: DC

## 2016-02-23 NOTE — Addendum Note (Signed)
Addended by: Fernande BrasJEFFERY, Lorene Samaan S on: 02/23/2016 01:30 PM   Modules accepted: Orders

## 2016-02-23 NOTE — Telephone Encounter (Signed)
I think I sent this on Monday, 03/02/2016. Please confirm with pharmacy that it was received.

## 2016-03-01 ENCOUNTER — Encounter: Payer: Self-pay | Admitting: Physician Assistant

## 2016-03-01 DIAGNOSIS — G4700 Insomnia, unspecified: Secondary | ICD-10-CM

## 2016-03-01 DIAGNOSIS — F419 Anxiety disorder, unspecified: Secondary | ICD-10-CM

## 2016-03-01 DIAGNOSIS — G894 Chronic pain syndrome: Secondary | ICD-10-CM

## 2016-03-01 DIAGNOSIS — F988 Other specified behavioral and emotional disorders with onset usually occurring in childhood and adolescence: Secondary | ICD-10-CM

## 2016-03-02 ENCOUNTER — Telehealth: Payer: Self-pay

## 2016-03-02 MED ORDER — AMPHETAMINE-DEXTROAMPHETAMINE 15 MG PO TABS: 15.0000 mg | ORAL_TABLET | Freq: Three times a day (TID) | ORAL | Status: DC

## 2016-03-02 MED ORDER — ALPRAZOLAM 2 MG PO TABS: ORAL_TABLET | ORAL | Status: DC

## 2016-03-02 MED ORDER — OXYCODONE HCL ER 60 MG PO T12A: 120.0000 mg | EXTENDED_RELEASE_TABLET | Freq: Two times a day (BID) | ORAL | Status: DC

## 2016-03-02 NOTE — Telephone Encounter (Signed)
Pt is needing a refill on oxycodone, adderall, zanax   Best number [phone]

## 2016-03-02 NOTE — Telephone Encounter (Signed)
Notified pt ready on VM. 

## 2016-03-31 ENCOUNTER — Ambulatory Visit (INDEPENDENT_AMBULATORY_CARE_PROVIDER_SITE_OTHER): Payer: Commercial Managed Care - PPO | Admitting: Physician Assistant

## 2016-03-31 VITALS — BP 116/80 | HR 93 | Temp 98.2°F | Resp 16 | Ht 65.75 in | Wt 201.0 lb

## 2016-03-31 DIAGNOSIS — F419 Anxiety disorder, unspecified: Secondary | ICD-10-CM | POA: Diagnosis not present

## 2016-03-31 DIAGNOSIS — M545 Low back pain, unspecified: Secondary | ICD-10-CM

## 2016-03-31 DIAGNOSIS — F988 Other specified behavioral and emotional disorders with onset usually occurring in childhood and adolescence: Secondary | ICD-10-CM

## 2016-03-31 DIAGNOSIS — G4700 Insomnia, unspecified: Secondary | ICD-10-CM

## 2016-03-31 DIAGNOSIS — F909 Attention-deficit hyperactivity disorder, unspecified type: Secondary | ICD-10-CM | POA: Diagnosis not present

## 2016-03-31 DIAGNOSIS — G894 Chronic pain syndrome: Secondary | ICD-10-CM

## 2016-03-31 MED ORDER — AMPHETAMINE-DEXTROAMPHETAMINE 15 MG PO TABS: 15.0000 mg | ORAL_TABLET | Freq: Three times a day (TID) | ORAL | Status: DC

## 2016-03-31 MED ORDER — ALPRAZOLAM 2 MG PO TABS: ORAL_TABLET | ORAL | Status: DC

## 2016-03-31 MED ORDER — OXYCODONE HCL ER 60 MG PO T12A: 120.0000 mg | EXTENDED_RELEASE_TABLET | Freq: Two times a day (BID) | ORAL | Status: DC

## 2016-03-31 NOTE — Progress Notes (Signed)
Patient ID: [name], female    DOB: 1971/03/29, 45 y.o.   MRN: [id number]  PCP: Olene Floss  Subjective:   Chief Complaint  Patient presents with  . Medication Management    discuss Alprazolam and Oxycodone     HPI Presents for evaluation of chronic pain, anxiety. Last visit with me was 08/2015. Shoulder fracture in 11/2015.  Working again, in a vape shop. Loves it. Gets her out of the house just enough, but still gives her the time to help her grandmother and aunt.  She has a long history of low back and knee pain. Known DDD of the lumbar spine. Prior to meeting her, she had been on high doses of pain medication, become dependent, and treated at a methadone clinic. She wanted to reduce the dose, but reported that the clinic was unwilling to, so she weaned herself off. She unfortunately continued to have pain that was intolerable. Her pain is complicated by a previous history of substance abuse, anxiety and depression and ADD.  Without medication, she rates her pain 8/10, down to 2/10 on the current dose "which is extremely tolerable." Notices it more now that she's working and on her feet more, as she is now, and with repeated bending, stooping, any lifting. 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 Gabapentin without benefit.  Anxiety and depression are currently well controlled and she noted that she is "Super calm" but not sleepy with alprazolam dose.  She is aware of the increased risk of chronic opiate use when benzodiazepines are also used. Her medications are kept in a locked box and her adult daughter, who lives with her, sets out each day's doses in the mornings before she leaves for work.   Review of Systems  Constitutional: Negative.   HENT: Negative.   Eyes: Negative.   Respiratory: Negative.   Cardiovascular: Negative.   Gastrointestinal: Negative.   Endocrine: Negative.   Genitourinary: Negative.   Musculoskeletal: Positive for back  pain and arthralgias. Negative for myalgias, joint swelling, gait problem and neck pain.  Skin: Negative.   Allergic/Immunologic: Negative.   Neurological: Negative.   Psychiatric/Behavioral: Positive for dysphoric mood and decreased concentration. Negative for suicidal ideas, sleep disturbance and self-injury. The patient is nervous/anxious.        Patient Active Problem List   Diagnosis Date Noted  . 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 IBS (irritable bowel syndrome) 06/03/2014  . Anxiety 03/09/2014  . Obesity (BMI 30-39.9) 10/27/2013  . Neck pain on left side 09/02/2013  . Cervical radiculopathy 08/18/2013  . Chronic pain disorder 08/16/2013  . Drug-seeking behavior 08/16/2013  . Lumbar back pain 11/21/2012  . Esophageal stricture 11/21/2012  . ADD (attention deficit disorder) 11/21/2012  . Hypertriglyceridemia 11/21/2012  . Pain 08/06/2012  . Carpal tunnel syndrome, bilateral 07/05/2012  . OSA (obstructive sleep apnea) 06/28/2012  . Constipation 01/01/2012  . Migraine headache 03/02/2011  . Depression 03/02/2011  . History of substance abuse 03/02/2011  . Hypertension 03/02/2011     Prior to Admission medications   Medication Sig Start Date End Date Taking? Authorizing Provider  acetaminophen (TYLENOL) 500 MG tablet Take 1,000 mg by mouth every 6 (six) hours as needed for moderate pain.   Yes Historical Provider, MD  alprazolam Prudy Feeler) 2 MG tablet Take 1/2 tablet (1 mg) as needed Morning and mid-day; take 1.5 tablets (3 mg) at bedtime as needed. 03/02/16  Yes Maritza Hosterman, PA-C  amLODipine (NORVASC) 10 MG tablet TAKE ONE TABLET  BY MOUTH AT BEDTIME. 01/19/16  Yes Kristan Brummitt, PA-C  amphetamine-dextroamphetamine (ADDERALL) 15 MG tablet Take 1 tablet by mouth 3 (three) times daily. 03/02/16  Yes Rollyn Scialdone, PA-C  ARIPiprazole (ABILIFY) 10 MG tablet Take 1 tablet (10 mg total) by mouth daily. 02/21/16  Yes Lanson Randle, PA-C  ciprofloxacin-hydrocortisone (CIPRO HC) otic  suspension Place 3 drops into the right ear 2 (two) times daily. 07/06/15  Yes Morrell Riddle, PA-C  EPINEPHrine (EPIPEN) 0.3 mg/0.3 mL IJ SOAJ injection Inject 0.3 mLs (0.3 mg total) into the muscle once as needed (Anaphylaxis). 07/01/14  Yes Yaire Kreher, PA-C  ibuprofen (ADVIL,MOTRIN) 200 MG tablet Take 600 mg by mouth 2 (two) times daily as needed for pain.   Yes Historical Provider, MD  lubiprostone (AMITIZA) 24 MCG capsule Take 1 capsule (24 mcg total) by mouth 2 (two) times daily with a meal. 07/06/15  Yes Morrell Riddle, PA-C  naproxen sodium (ANAPROX DS) 550 MG tablet Take 1 tablet (550 mg total) by mouth 2 (two) times daily with a meal. 11/17/15 11/16/16 Yes [name], MD  ondansetron (ZOFRAN-ODT) 8 MG disintegrating tablet DISSOLVE 1 TABLET BY MOUTH EVERY 8 HOURS AS NEEDED FOR NAUSEA 01/21/15  Yes [name], MD  oxyCODONE (OXYCONTIN) 60 MG 12 hr tablet Take 120 mg by mouth every 12 (twelve) hours. 03/02/16  Yes Izumi Mixon, PA-C  pantoprazole (PROTONIX) 40 MG tablet Take 1 tablet (40 mg total) by mouth at bedtime. 06/16/15  Yes Morrell Riddle, PA-C  sertraline (ZOLOFT) 100 MG tablet Take 1 tablet (100 mg total) by mouth daily. 12/16/15  Yes Anselmo Reihl, PA-C  valACYclovir (VALTREX) 1000 MG tablet Take 1 tablet (1,000 mg total) by mouth daily. 06/16/15  Yes Morrell Riddle, PA-C     Allergies  Allergen Reactions  . Imitrex [Sumatriptan Base] Shortness Of Breath and Other (See Comments)    TACHYCARDIA  . Trazodone And Nefazodone Other (See Comments)    NIGHTMARES  . Wellbutrin [Bupropion] Other (See Comments)    "crawling out of my skin"  . Effexor [Venlafaxine] Other (See Comments)    MAKES HER "JITTERY"  . Metoclopramide Hcl Itching  . Morphine And Related Itching  . Tegretol [Carbamazepine] Itching  . Topamax Itching       Objective:  Physical Exam  Constitutional: She is oriented to person, place, and time. She appears well-developed and well-nourished. She is  active and cooperative. No distress.  BP 116/80 mmHg  Pulse 93  Temp(Src) 98.2 F (36.8 C) (Oral)  Resp 16  Ht 5' 5.75" (1.67 m)  Wt 201 lb (91.173 kg)  BMI 32.69 kg/m2  SpO2 97%  HENT:  Head: Normocephalic and atraumatic.  Right Ear: Hearing normal.  Left Ear: Hearing normal.  Eyes: Conjunctivae are normal. No scleral icterus.  Neck: Normal range of motion. Neck supple. No thyromegaly present.  Cardiovascular: Normal rate, regular rhythm and normal heart sounds.   Pulses:      Radial pulses are 2+ on the right side, and 2+ on the left side.  Pulmonary/Chest: Effort normal and breath sounds normal.  Musculoskeletal:       Lumbar back: She exhibits tenderness, bony tenderness and pain. She exhibits normal range of motion, no swelling, no edema, no deformity, no laceration, no spasm and normal pulse.  Lymphadenopathy:       Head (right side): No tonsillar, no preauricular, no posterior auricular and no occipital adenopathy present.       Head (left side):  No tonsillar, no preauricular, no posterior auricular and no occipital adenopathy present.    She has no cervical adenopathy.       Right: No supraclavicular adenopathy present.       Left: No supraclavicular adenopathy present.  Neurological: She is alert and oriented to person, place, and time. No sensory deficit.  Skin: Skin is warm, dry and intact. No rash noted. No cyanosis or erythema. Nails show no clubbing.  Psychiatric: She has a normal mood and affect. Her speech is normal and behavior is normal.           Assessment & Plan:   1. Midline low back pain without sciatica 2. Chronic pain disorder Good control with current regimen. Counseled again on the increased risk of death associated with chronic opiates, especially with concomitant benzodiazepine use. Reviewed expected chronic controlled substance use plan in the near future, which will include drug testing.  - oxyCODONE (OXYCONTIN) 60 MG 12 hr tablet; Take 120 mg  by mouth every 12 (twelve) hours.  Dispense: 120 each; Refill: 0  3. Anxiety Well controlled. See above. - alprazolam (XANAX) 2 MG tablet; Take 1/2 tablet (1 mg) as needed Morning and mid-day; take 1.5 tablets (3 mg) at bedtime as needed.  Dispense: 75 tablet; Refill: 0  4. ADD (attention deficit disorder) Well controlled. - amphetamine-dextroamphetamine (ADDERALL) 15 MG tablet; Take 1 tablet by mouth 3 (three) times daily.  Dispense: 90 tablet; Refill: 0  5. Insomnia Well controlled. - alprazolam (XANAX) 2 MG tablet; Take 1/2 tablet (1 mg) as needed Morning and mid-day; take 1.5 tablets (3 mg) at bedtime as needed.  Dispense: 75 tablet; Refill: 0   Return in about 4 months (around 08/01/2016) for re-evaluation and fasting labs.    [name], PA-C Physician Assistant-Certified [facility]

## 2016-03-31 NOTE — Patient Instructions (Signed)
     IF you received an x-ray today, you will receive an invoice from [facility]. Please contact [facility] at [phone] with questions or concerns regarding your invoice.   IF you received labwork today, you will receive an invoice from [facility]. Please contact [facility] at [phone] with questions or concerns regarding your invoice.   Our billing staff will not be able to assist you with questions regarding bills from these companies.  You will be contacted with the lab results as soon as they are available. The fastest way to get your results is to activate your My Chart account. Instructions are located on the last page of this paperwork. If you have not heard from us regarding the results in 2 weeks, please contact this office.      

## 2016-04-05 ENCOUNTER — Other Ambulatory Visit: Payer: Self-pay | Admitting: Physician Assistant

## 2016-04-22 ENCOUNTER — Encounter: Payer: Self-pay | Admitting: Physician Assistant

## 2016-04-24 ENCOUNTER — Encounter: Payer: Self-pay | Admitting: Physician Assistant

## 2016-04-24 DIAGNOSIS — F988 Other specified behavioral and emotional disorders with onset usually occurring in childhood and adolescence: Secondary | ICD-10-CM

## 2016-04-24 MED ORDER — AMPHETAMINE-DEXTROAMPHETAMINE 20 MG PO TABS: 20.0000 mg | ORAL_TABLET | Freq: Three times a day (TID) | ORAL | Status: DC

## 2016-04-24 NOTE — Telephone Encounter (Signed)
Patient notified via My Chart.  Meds ordered this encounter  Medications  . amphetamine-dextroamphetamine (ADDERALL) 20 MG tablet    Sig: Take 1 tablet (20 mg total) by mouth 3 (three) times daily.    Dispense:  90 tablet    Refill:  0    Order Specific Question:  Supervising Provider    Answer:  DOOLITTLE, ROBERT P [[id number]]

## 2016-04-26 ENCOUNTER — Encounter: Payer: Self-pay | Admitting: Physician Assistant

## 2016-04-26 DIAGNOSIS — G894 Chronic pain syndrome: Secondary | ICD-10-CM

## 2016-04-26 DIAGNOSIS — F419 Anxiety disorder, unspecified: Secondary | ICD-10-CM

## 2016-04-26 DIAGNOSIS — G4700 Insomnia, unspecified: Secondary | ICD-10-CM

## 2016-04-27 ENCOUNTER — Encounter: Payer: Self-pay | Admitting: Physician Assistant

## 2016-04-27 MED ORDER — ALPRAZOLAM 2 MG PO TABS: ORAL_TABLET | ORAL | Status: DC

## 2016-04-27 MED ORDER — OXYCODONE HCL ER 60 MG PO T12A: 120.0000 mg | EXTENDED_RELEASE_TABLET | Freq: Two times a day (BID) | ORAL | Status: DC

## 2016-04-27 NOTE — Telephone Encounter (Signed)
Patient notified via My Chart.  Meds ordered this encounter  Medications  . oxyCODONE (OXYCONTIN) 60 MG 12 hr tablet    Sig: Take 120 mg by mouth every 12 (twelve) hours.    Dispense:  120 each    Refill:  0    Order Specific Question:  Supervising Provider    Answer:  DOOLITTLE, ROBERT P [[id number]]  . alprazolam (XANAX) 2 MG tablet    Sig: Take 1/2 tablet (1 mg) as needed Morning and mid-day; take 1.5 tablets (3 mg) at bedtime as needed.    Dispense:  75 tablet    Refill:  0    Order Specific Question:  Supervising Provider    Answer:  DOOLITTLE, ROBERT P [[id number]]

## 2016-05-04 ENCOUNTER — Other Ambulatory Visit: Payer: Self-pay | Admitting: Physician Assistant

## 2016-05-04 MED ORDER — AMLODIPINE BESYLATE 10 MG PO TABS: 10.0000 mg | ORAL_TABLET | Freq: Every day | ORAL | Status: DC

## 2016-05-04 MED ORDER — ONDANSETRON 8 MG PO TBDP: ORAL_TABLET | ORAL | Status: DC

## 2016-05-04 NOTE — Addendum Note (Signed)
Addended by: Cydney OkAUGUSTIN, Calloway Andrus N on: 05/04/2016 12:50 PM   Modules accepted: Orders

## 2016-05-21 ENCOUNTER — Encounter: Payer: Self-pay | Admitting: Physician Assistant

## 2016-05-21 DIAGNOSIS — G4700 Insomnia, unspecified: Secondary | ICD-10-CM

## 2016-05-21 DIAGNOSIS — G894 Chronic pain syndrome: Secondary | ICD-10-CM

## 2016-05-21 DIAGNOSIS — F419 Anxiety disorder, unspecified: Secondary | ICD-10-CM

## 2016-05-22 ENCOUNTER — Encounter: Payer: Self-pay | Admitting: Physician Assistant

## 2016-05-23 ENCOUNTER — Other Ambulatory Visit: Payer: Self-pay | Admitting: Physician Assistant

## 2016-05-23 ENCOUNTER — Telehealth: Payer: Self-pay

## 2016-05-23 MED ORDER — OXYCODONE HCL ER 60 MG PO T12A: 120.0000 mg | EXTENDED_RELEASE_TABLET | Freq: Two times a day (BID) | ORAL | Status: DC

## 2016-05-23 MED ORDER — ALPRAZOLAM 2 MG PO TABS: ORAL_TABLET | ORAL | Status: DC

## 2016-05-23 MED ORDER — AMPHETAMINE-DEXTROAMPHETAMINE 20 MG PO TABS: 20.0000 mg | ORAL_TABLET | Freq: Three times a day (TID) | ORAL | Status: DC

## 2016-05-23 NOTE — Telephone Encounter (Signed)
Patient notified via My Chart.    Meds ordered this encounter  Medications  . alprazolam (XANAX) 2 MG tablet    Sig: Take 1/2 tablet (1 mg) as needed Morning and mid-day; take 1.5 tablets (3 mg) at bedtime as needed.    Dispense:  75 tablet    Refill:  0    Order Specific Question:  Supervising Provider    Answer:  SHAW, EVA N [[id number]]  . amphetamine-dextroamphetamine (ADDERALL) 20 MG tablet    Sig: Take 1 tablet (20 mg total) by mouth 3 (three) times daily.    Dispense:  90 tablet    Refill:  0    Order Specific Question:  Supervising Provider    Answer:  SHAW, EVA N [[id number]]  . oxyCODONE (OXYCONTIN) 60 MG 12 hr tablet    Sig: Take 120 mg by mouth every 12 (twelve) hours.    Dispense:  120 each    Refill:  0    Order Specific Question:  Supervising Provider    Answer:  Clelia CroftSHAW, EVA N [[id number]]

## 2016-05-23 NOTE — Telephone Encounter (Signed)
PATIENT WOULD LIKE [name] TO KNOW THAT IT IS TIME FOR HER PRESCRIPTIONS TO BE WRITTEN FOR OXYCONTIN 60 MG, XANAX 2 MG AND ADDERALL 20 MG. PLEASE CALL HER WHEN THEY CAN BE PICKED UP. BEST PHONE [phone].  [facility]

## 2016-05-23 NOTE — Telephone Encounter (Signed)
Email about this.

## 2016-05-25 ENCOUNTER — Other Ambulatory Visit: Payer: Self-pay | Admitting: Physician Assistant

## 2016-05-26 MED ORDER — ARIPIPRAZOLE 10 MG PO TABS: 10.0000 mg | ORAL_TABLET | Freq: Every day | ORAL | Status: DC

## 2016-05-26 NOTE — Addendum Note (Signed)
Addended by: Cydney OkAUGUSTIN, Horald Birky N on: 05/26/2016 11:03 AM   Modules accepted: Orders

## 2016-06-12 ENCOUNTER — Telehealth: Payer: Self-pay

## 2016-06-12 NOTE — Telephone Encounter (Signed)
PA was completed on covermymeds with a pretty long list of medications pt has tried previously that are in our records. I received a denial back because pt has not tried/failed one of the two preferred meds: Miralax or lactulose. Changes are good that pt may have tried either of these, esp Miralax OTC, but it is just not in our records. Called pt and LMOM to CB to ask her if she has ever tried either of these meds (in addition to the many I already reported that are in our records).

## 2016-06-15 NOTE — Telephone Encounter (Signed)
LMOM again to CB to answer whether she has ever tried/failed Miralax and/or lactulose.

## 2016-06-20 ENCOUNTER — Ambulatory Visit (INDEPENDENT_AMBULATORY_CARE_PROVIDER_SITE_OTHER): Payer: Commercial Managed Care - PPO | Admitting: Physician Assistant

## 2016-06-20 ENCOUNTER — Encounter: Payer: Self-pay | Admitting: Physician Assistant

## 2016-06-20 ENCOUNTER — Other Ambulatory Visit: Payer: Self-pay | Admitting: Physician Assistant

## 2016-06-20 VITALS — BP 116/80 | HR 88 | Temp 98.7°F | Resp 16 | Ht 65.5 in | Wt 200.6 lb

## 2016-06-20 DIAGNOSIS — R52 Pain, unspecified: Secondary | ICD-10-CM

## 2016-06-20 DIAGNOSIS — F909 Attention-deficit hyperactivity disorder, unspecified type: Secondary | ICD-10-CM

## 2016-06-20 DIAGNOSIS — I10 Essential (primary) hypertension: Secondary | ICD-10-CM

## 2016-06-20 DIAGNOSIS — F419 Anxiety disorder, unspecified: Secondary | ICD-10-CM

## 2016-06-20 DIAGNOSIS — M545 Low back pain, unspecified: Secondary | ICD-10-CM

## 2016-06-20 DIAGNOSIS — K589 Irritable bowel syndrome without diarrhea: Secondary | ICD-10-CM

## 2016-06-20 DIAGNOSIS — F329 Major depressive disorder, single episode, unspecified: Secondary | ICD-10-CM

## 2016-06-20 DIAGNOSIS — G894 Chronic pain syndrome: Secondary | ICD-10-CM

## 2016-06-20 DIAGNOSIS — F988 Other specified behavioral and emotional disorders with onset usually occurring in childhood and adolescence: Secondary | ICD-10-CM

## 2016-06-20 DIAGNOSIS — E781 Pure hyperglyceridemia: Secondary | ICD-10-CM | POA: Diagnosis not present

## 2016-06-20 DIAGNOSIS — K222 Esophageal obstruction: Secondary | ICD-10-CM | POA: Diagnosis not present

## 2016-06-20 DIAGNOSIS — M542 Cervicalgia: Secondary | ICD-10-CM

## 2016-06-20 DIAGNOSIS — G4700 Insomnia, unspecified: Secondary | ICD-10-CM

## 2016-06-20 DIAGNOSIS — M5412 Radiculopathy, cervical region: Secondary | ICD-10-CM

## 2016-06-20 DIAGNOSIS — F32A Depression, unspecified: Secondary | ICD-10-CM

## 2016-06-20 LAB — CBC WITH DIFFERENTIAL/PLATELET
BASOS ABS: 74 {cells}/uL (ref 0–200)
Basophils Relative: 1 %
EOS ABS: 148 {cells}/uL (ref 15–500)
Eosinophils Relative: 2 %
HCT: 41 % (ref 35.0–45.0)
HEMOGLOBIN: 13.8 g/dL (ref 11.7–15.5)
LYMPHS ABS: 2442 {cells}/uL (ref 850–3900)
MCH: 27.7 pg (ref 27.0–33.0)
MCHC: 33.7 g/dL (ref 32.0–36.0)
MCV: 82.2 fL (ref 80.0–100.0)
MPV: 9.2 fL (ref 7.5–12.5)
Monocytes Absolute: 370 cells/uL (ref 200–950)
Monocytes Relative: 5 %
NEUTROS ABS: 4366 {cells}/uL (ref 1500–7800)
Neutrophils Relative %: 59 %
Platelets: 330 10*3/uL (ref 140–400)
RBC: 4.99 MIL/uL (ref 3.80–5.10)
RDW: 14 % (ref 11.0–15.0)
WBC: 7.4 10*3/uL (ref 3.8–10.8)

## 2016-06-20 MED ORDER — PANTOPRAZOLE SODIUM 40 MG PO TBEC: 40.0000 mg | DELAYED_RELEASE_TABLET | Freq: Every day | ORAL | 3 refills | Status: DC

## 2016-06-20 MED ORDER — OXYCODONE HCL ER 60 MG PO T12A: 120.0000 mg | EXTENDED_RELEASE_TABLET | Freq: Two times a day (BID) | ORAL | 0 refills | Status: DC

## 2016-06-20 MED ORDER — AMPHETAMINE-DEXTROAMPHETAMINE 20 MG PO TABS: 20.0000 mg | ORAL_TABLET | Freq: Three times a day (TID) | ORAL | 0 refills | Status: DC

## 2016-06-20 MED ORDER — ALPRAZOLAM 2 MG PO TABS: ORAL_TABLET | ORAL | 0 refills | Status: DC

## 2016-06-20 MED ORDER — EPINEPHRINE 0.3 MG/0.3ML IJ SOAJ: 0.3000 mg | Freq: Once | INTRAMUSCULAR | 2 refills | Status: DC | PRN
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
## 2016-06-20 NOTE — Patient Instructions (Addendum)
Please review the Controlled Substance Contract sample. We will plan to sign one at your next visit. I'm not sure when the policy will go into effect, but we will be as prepared as possible.    IF you received an x-ray today, you will receive an invoice from [facility]. Please contact [facility] at [phone] with questions or concerns regarding your invoice.   IF you received labwork today, you will receive an invoice from [facility]. Please contact [facility] at [phone] with questions or concerns regarding your invoice.   Our billing staff will not be able to assist you with questions regarding bills from these companies.  You will be contacted with the lab results as soon as they are available. The fastest way to get your results is to activate your My Chart account. Instructions are located on the last page of this paperwork. If you have not heard from us regarding the results in 2 weeks, please contact this office.

## 2016-06-21 ENCOUNTER — Telehealth: Payer: Self-pay

## 2016-06-21 LAB — LIPID PANEL
Cholesterol: 194 mg/dL (ref 125–200)
HDL: 22 mg/dL — AB (ref >=46)
LDL CALC: 131 mg/dL — AB (ref <130)
TRIGLYCERIDES: 207 mg/dL — AB (ref <150)
Total CHOL/HDL Ratio: 8.8 Ratio — ABNORMAL HIGH (ref <=5.0)
VLDL: 41 mg/dL — AB (ref <30)

## 2016-06-21 LAB — COMPREHENSIVE METABOLIC PANEL
ALBUMIN: 4.6 g/dL (ref 3.6–5.1)
ALT: 27 U/L (ref 6–29)
AST: 28 U/L (ref 10–35)
Alkaline Phosphatase: 95 U/L (ref 33–115)
BUN: 5 mg/dL — ABNORMAL LOW (ref 7–25)
CHLORIDE: 100 mmol/L (ref 98–110)
CO2: 28 mmol/L (ref 20–31)
CREATININE: 0.81 mg/dL (ref 0.50–1.10)
Calcium: 9.8 mg/dL (ref 8.6–10.2)
GLUCOSE: 98 mg/dL (ref 65–99)
Potassium: 3.7 mmol/L (ref 3.5–5.3)
SODIUM: 139 mmol/L (ref 135–146)
Total Bilirubin: 0.4 mg/dL (ref 0.2–1.2)
Total Protein: 7.7 g/dL (ref 6.1–8.1)

## 2016-06-21 NOTE — Telephone Encounter (Signed)
Yes, I am trying to test for medications of abuse, including alcohol.

## 2016-06-21 NOTE — Telephone Encounter (Signed)
Spoke with [facility] and they changed the code

## 2016-06-27 ENCOUNTER — Ambulatory Visit: Payer: Commercial Managed Care - PPO | Admitting: Physician Assistant

## 2016-06-28 LAB — PAIN MGMT, PROFILE 6 CONF W/O MM, U
6 Acetylmorphine: NEGATIVE ng/mL (ref <10)
ALPHAHYDROXYMIDAZOLAM: NEGATIVE ng/mL (ref <50)
AMINOCLONAZEPAM: NEGATIVE ng/mL (ref <25)
AMPHETAMINE: 2224 ng/mL — AB (ref <250)
AMPHETAMINES: POSITIVE ng/mL — AB (ref <500)
Alcohol Metabolites: NEGATIVE ng/mL (ref <500)
Alphahydroxyalprazolam: 495 ng/mL — ABNORMAL HIGH (ref <25)
Alphahydroxytriazolam: NEGATIVE ng/mL (ref <50)
Barbiturates: NEGATIVE ng/mL (ref <300)
Benzodiazepines: POSITIVE ng/mL — AB (ref <100)
COCAINE METABOLITE: NEGATIVE ng/mL (ref <150)
CODEINE: NEGATIVE ng/mL (ref <50)
CREATININE: 85.3 mg/dL (ref >=20.0)
HYDROXYETHYLFLURAZEPAM: NEGATIVE ng/mL (ref <50)
Hydrocodone: NEGATIVE ng/mL (ref <50)
Hydromorphone: NEGATIVE ng/mL (ref <50)
Lorazepam: NEGATIVE ng/mL (ref <50)
METHADONE METABOLITE: NEGATIVE ng/mL (ref <100)
Marijuana Metabolite: NEGATIVE ng/mL (ref <20)
Methamphetamine: NEGATIVE ng/mL (ref <250)
Morphine: NEGATIVE ng/mL (ref <50)
NORHYDROCODONE: NEGATIVE ng/mL (ref <50)
Nordiazepam: NEGATIVE ng/mL (ref <50)
Noroxycodone: >10000 ng/mL — ABNORMAL HIGH (ref <50)
OXAZEPAM: NEGATIVE ng/mL (ref <50)
OXIDANT: NEGATIVE ug/mL (ref <200)
OXYCODONE: 5149 ng/mL — AB (ref <50)
OXYCODONE: POSITIVE ng/mL — AB (ref <100)
OXYMORPHONE: 18575 ng/mL — AB (ref <50)
Opiates: NEGATIVE ng/mL (ref <100)
PH: 7.72 (ref 4.5–9.0)
Phencyclidine: NEGATIVE ng/mL (ref <25)
Please note:: 0
TEMAZEPAM: NEGATIVE ng/mL (ref <50)

## 2016-07-04 ENCOUNTER — Encounter: Payer: Self-pay | Admitting: Physician Assistant

## 2016-07-05 MED ORDER — ATORVASTATIN CALCIUM 20 MG PO TABS: 20.0000 mg | ORAL_TABLET | Freq: Every day | ORAL | 3 refills | Status: DC

## 2016-07-17 ENCOUNTER — Encounter: Payer: Self-pay | Admitting: Physician Assistant

## 2016-07-17 DIAGNOSIS — G894 Chronic pain syndrome: Secondary | ICD-10-CM

## 2016-07-17 DIAGNOSIS — G4700 Insomnia, unspecified: Secondary | ICD-10-CM

## 2016-07-17 DIAGNOSIS — F419 Anxiety disorder, unspecified: Secondary | ICD-10-CM

## 2016-07-17 DIAGNOSIS — F988 Other specified behavioral and emotional disorders with onset usually occurring in childhood and adolescence: Secondary | ICD-10-CM

## 2016-07-17 MED ORDER — ALPRAZOLAM 2 MG PO TABS: ORAL_TABLET | ORAL | 0 refills | Status: DC

## 2016-07-17 MED ORDER — OXYCODONE HCL ER 60 MG PO T12A: 120.0000 mg | EXTENDED_RELEASE_TABLET | Freq: Two times a day (BID) | ORAL | 0 refills | Status: DC

## 2016-07-17 MED ORDER — AMPHETAMINE-DEXTROAMPHETAMINE 20 MG PO TABS: 20.0000 mg | ORAL_TABLET | Freq: Three times a day (TID) | ORAL | 0 refills | Status: DC

## 2016-07-17 NOTE — Telephone Encounter (Signed)
Pended. Last RFs 06/20/16.

## 2016-07-17 NOTE — Telephone Encounter (Signed)
Patient notified via My Chart.  Meds ordered this encounter  Medications  . amphetamine-dextroamphetamine (ADDERALL) 20 MG tablet    Sig: Take 1 tablet (20 mg total) by mouth 3 (three) times daily.    Dispense:  90 tablet    Refill:  0  . oxyCODONE (OXYCONTIN) 60 MG 12 hr tablet    Sig: Take 120 mg by mouth every 12 (twelve) hours.    Dispense:  120 each    Refill:  0  . alprazolam (XANAX) 2 MG tablet    Sig: Take 1/2 tablet (1 mg) as needed Morning and mid-day; take 1.5 tablets (3 mg) at bedtime as needed.    Dispense:  75 tablet    Refill:  0

## 2016-08-01 ENCOUNTER — Ambulatory Visit: Payer: Commercial Managed Care - PPO | Admitting: Physician Assistant

## 2016-08-11 ENCOUNTER — Other Ambulatory Visit: Payer: Self-pay | Admitting: Physician Assistant

## 2016-08-14 ENCOUNTER — Encounter: Payer: Self-pay | Admitting: Physician Assistant

## 2016-08-16 ENCOUNTER — Other Ambulatory Visit: Payer: Self-pay

## 2016-08-16 DIAGNOSIS — F419 Anxiety disorder, unspecified: Secondary | ICD-10-CM

## 2016-08-16 DIAGNOSIS — G894 Chronic pain syndrome: Secondary | ICD-10-CM

## 2016-08-16 DIAGNOSIS — F988 Other specified behavioral and emotional disorders with onset usually occurring in childhood and adolescence: Secondary | ICD-10-CM

## 2016-08-16 NOTE — Telephone Encounter (Signed)
alprazolam (XANAX) 2 MG tablet, amphetamine-dextroamphetamine (ADDERALL) 20 MG tablet, and oxyCODONE (OXYCONTIN) 60 MG 12 hr tablet   Patient request a refill. If [name] is not able to approve refill patient request for Benny LennertSarah Weber to fill medication.

## 2016-08-17 ENCOUNTER — Ambulatory Visit (INDEPENDENT_AMBULATORY_CARE_PROVIDER_SITE_OTHER): Payer: Commercial Managed Care - PPO | Admitting: Physician Assistant

## 2016-08-17 VITALS — BP 116/70 | HR 112 | Temp 97.7°F | Resp 20 | Ht 66.5 in | Wt 195.8 lb

## 2016-08-17 DIAGNOSIS — G894 Chronic pain syndrome: Secondary | ICD-10-CM

## 2016-08-17 DIAGNOSIS — F988 Other specified behavioral and emotional disorders with onset usually occurring in childhood and adolescence: Secondary | ICD-10-CM

## 2016-08-17 DIAGNOSIS — F419 Anxiety disorder, unspecified: Secondary | ICD-10-CM

## 2016-08-17 MED ORDER — OXYCODONE HCL ER 60 MG PO T12A: 120.0000 mg | EXTENDED_RELEASE_TABLET | Freq: Two times a day (BID) | ORAL | 0 refills | Status: DC

## 2016-08-17 MED ORDER — AMPHETAMINE-DEXTROAMPHETAMINE 20 MG PO TABS: 20.0000 mg | ORAL_TABLET | Freq: Three times a day (TID) | ORAL | 0 refills | Status: DC

## 2016-08-17 MED ORDER — ALPRAZOLAM 2 MG PO TABS: ORAL_TABLET | ORAL | 0 refills | Status: DC

## 2016-08-17 NOTE — Telephone Encounter (Signed)
Pt seen in office, given prescriptions by Wanda/ Sarah/[name]

## 2016-08-17 NOTE — Telephone Encounter (Signed)
Meds ordered this encounter  Medications  . alprazolam (XANAX) 2 MG tablet    Sig: Take 1/2 tablet (1 mg) as needed Morning and mid-day; take 1.5 tablets (3 mg) at bedtime as needed.    Dispense:  75 tablet    Refill:  0  . amphetamine-dextroamphetamine (ADDERALL) 20 MG tablet    Sig: Take 1 tablet (20 mg total) by mouth 3 (three) times daily.    Dispense:  90 tablet    Refill:  0    Order Specific Question:   Supervising Provider    Answer:   SHAW, EVA N [[id number]]  . oxyCODONE (OXYCONTIN) 60 MG 12 hr tablet    Sig: Take 120 mg by mouth every 12 (twelve) hours.    Dispense:  120 each    Refill:  0    Order Specific Question:   Supervising Provider    Answer:   Clelia CroftSHAW, EVA N [[id number]]   Follow-up with me in 1 month, as planned.

## 2016-08-17 NOTE — Patient Instructions (Signed)
     IF you received an x-ray today, you will receive an invoice from [facility]. Please contact [facility] at [phone] with questions or concerns regarding your invoice.   IF you received labwork today, you will receive an invoice from [facility]. Please contact [facility] at [phone] with questions or concerns regarding your invoice.   Our billing staff will not be able to assist you with questions regarding bills from these companies.  You will be contacted with the lab results as soon as they are available. The fastest way to get your results is to activate your My Chart account. Instructions are located on the last page of this paperwork. If you have not heard from us regarding the results in 2 weeks, please contact this office.      

## 2016-08-17 NOTE — Telephone Encounter (Signed)
Has an appt. Today.

## 2016-08-17 NOTE — Telephone Encounter (Signed)
Meds ordered this encounter  Medications  . alprazolam (XANAX) 2 MG tablet    Sig: Take 1/2 tablet (1 mg) as needed Morning and mid-day; take 1.5 tablets (3 mg) at bedtime as needed.    Dispense:  75 tablet    Refill:  0

## 2016-08-17 NOTE — Progress Notes (Signed)
Pt was not seen - Rx were given to her.

## 2016-08-29 ENCOUNTER — Ambulatory Visit (INDEPENDENT_AMBULATORY_CARE_PROVIDER_SITE_OTHER): Payer: Commercial Managed Care - PPO | Admitting: Physician Assistant

## 2016-08-29 ENCOUNTER — Encounter: Payer: Self-pay | Admitting: Physician Assistant

## 2016-08-29 VITALS — BP 116/84 | HR 102 | Temp 98.6°F | Resp 16 | Ht 65.75 in | Wt 195.2 lb

## 2016-08-29 DIAGNOSIS — G8929 Other chronic pain: Secondary | ICD-10-CM

## 2016-08-29 DIAGNOSIS — Z23 Encounter for immunization: Secondary | ICD-10-CM

## 2016-08-29 DIAGNOSIS — F329 Major depressive disorder, single episode, unspecified: Secondary | ICD-10-CM

## 2016-08-29 DIAGNOSIS — F419 Anxiety disorder, unspecified: Secondary | ICD-10-CM

## 2016-08-29 DIAGNOSIS — M545 Low back pain, unspecified: Secondary | ICD-10-CM

## 2016-08-29 DIAGNOSIS — F988 Other specified behavioral and emotional disorders with onset usually occurring in childhood and adolescence: Secondary | ICD-10-CM | POA: Diagnosis not present

## 2016-08-29 DIAGNOSIS — F32A Depression, unspecified: Secondary | ICD-10-CM

## 2016-08-29 DIAGNOSIS — R52 Pain, unspecified: Secondary | ICD-10-CM | POA: Diagnosis not present

## 2016-08-29 DIAGNOSIS — G894 Chronic pain syndrome: Secondary | ICD-10-CM | POA: Diagnosis not present

## 2016-08-29 MED ORDER — AMPHETAMINE-DEXTROAMPHETAMINE 20 MG PO TABS: 20.0000 mg | ORAL_TABLET | Freq: Three times a day (TID) | ORAL | 0 refills | Status: DC

## 2016-08-29 MED ORDER — ARIPIPRAZOLE 10 MG PO TABS: 10.0000 mg | ORAL_TABLET | Freq: Every day | ORAL | 1 refills | Status: DC

## 2016-08-29 MED ORDER — OXYCODONE HCL ER 60 MG PO T12A: 120.0000 mg | EXTENDED_RELEASE_TABLET | Freq: Two times a day (BID) | ORAL | 0 refills | Status: DC

## 2016-08-29 MED ORDER — ALPRAZOLAM 2 MG PO TABS: 1.0000 mg | ORAL_TABLET | Freq: Two times a day (BID) | ORAL | 0 refills | Status: DC | PRN

## 2016-08-29 NOTE — Progress Notes (Signed)
Patient ID: [name], female    DOB: Dec 21, 1970, 45 y.o.   MRN: [id number]  PCP: Porfirio Oar, PA-C  Subjective:   Chief Complaint  Patient presents with  . Follow-up    from last week, pt was here for Rx refills  . Medication Refill    Abilify 10 mg    HPI Presents for evaluation of chronic pain, anxiety, ADD and refill of Abilify.  Based on the opioid Risk Tool completed 06/2016, she is high risk and therefore needs more frequent assessment and drug screening.  Isn't using the alprazolam during the day, now just taking 2 at HS. Oxycodone, reduces the dose from QID to TID Q3rd day. Has tolerated that well. Her pharmacist has expressed concern, and recommended that she continue her efforts to reduce the doses.  NCCS[place] reviewed today. No prescriptions unknown to me. UDS 06/2016, will repeat today.  Vaping a CBD oil. No THC contained in it.  Going to the gym. Has lost 5 lbs. Very pleased.   Review of Systems  Constitutional: Negative.   HENT: Negative for sore throat.   Eyes: Negative for visual disturbance.  Respiratory: Negative for cough, chest tightness, shortness of breath and wheezing.   Cardiovascular: Negative for chest pain and palpitations.  Gastrointestinal: Negative for abdominal pain, diarrhea, nausea and vomiting.  Endocrine: Negative.   Genitourinary: Negative for dysuria, frequency, hematuria and urgency.  Musculoskeletal: Positive for arthralgias and back pain. Negative for myalgias.  Skin: Negative for rash.  Allergic/Immunologic: Negative.   Neurological: Negative for dizziness, weakness and headaches.  Hematological: Negative.   Psychiatric/Behavioral: Negative for decreased concentration. The patient is not nervous/anxious (much improved with new job).        Patient Active Problem List   Diagnosis Date Noted  . 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 IBS (irritable bowel syndrome) 06/03/2014  . Anxiety 03/09/2014  . Obesity (BMI 30-39.9)  10/27/2013  . Neck pain on left side 09/02/2013  . Cervical radiculopathy 08/18/2013  . Chronic pain disorder 08/16/2013  . Drug-seeking behavior 08/16/2013  . Lumbar back pain 11/21/2012  . Esophageal stricture 11/21/2012  . ADD (attention deficit disorder) 11/21/2012  . Hypertriglyceridemia 11/21/2012  . Pain 08/06/2012  . Carpal tunnel syndrome, bilateral 07/05/2012  . OSA (obstructive sleep apnea) 06/28/2012  . Constipation 01/01/2012  . Migraine headache 03/02/2011  . Depression 03/02/2011  . History of substance abuse 03/02/2011  . Hypertension 03/02/2011     Prior to Admission medications   Medication Sig Start Date End Date Taking? Authorizing Provider  acetaminophen (TYLENOL) 500 MG tablet Take 1,000 mg by mouth every 6 (six) hours as needed for moderate pain.   Yes Historical Provider, MD  alprazolam Prudy Feeler) 2 MG tablet Take 1/2 tablet (1 mg) as needed Morning and mid-day; take 1.5 tablets (3 mg) at bedtime as needed. 08/17/16  Yes Willford Rabideau, PA-C  amLODipine (NORVASC) 10 MG tablet TAKE 1 TABLET (10 MG TOTAL) BY MOUTH AT BEDTIME. 08/13/16  Yes Peaches Vanoverbeke, PA-C  amphetamine-dextroamphetamine (ADDERALL) 20 MG tablet Take 1 tablet (20 mg total) by mouth 3 (three) times daily. 08/17/16  Yes Solange Emry, PA-C  ARIPiprazole (ABILIFY) 10 MG tablet Take 1 tablet (10 mg total) by mouth daily. 05/26/16  Yes Sora Vrooman, PA-C  atorvastatin (LIPITOR) 20 MG tablet Take 1 tablet (20 mg total) by mouth daily. 07/05/16  Yes Myasia Sinatra, PA-C  ciprofloxacin-hydrocortisone (CIPRO HC) otic suspension Place 3 drops into the right ear 2 (two) times daily. 07/06/15  Yes  Morrell Riddle, PA-C  EPINEPHrine (EPIPEN) 0.3 mg/0.3 mL IJ SOAJ injection Inject 0.3 mLs (0.3 mg total) into the muscle once as needed (Anaphylaxis). 06/20/16  Yes Tatjana Turcott, PA-C  EPINEPHrine 0.3 mg/0.3 mL IJ SOAJ injection Inject into the muscle.   Yes Historical Provider, MD  ibuprofen (ADVIL,MOTRIN) 200 MG  tablet Take 600 mg by mouth 2 (two) times daily as needed for pain.   Yes Historical Provider, MD  ondansetron (ZOFRAN-ODT) 8 MG disintegrating tablet DISSOLVE 1 TABLET BY MOUTH EVERY 8 HOURS AS NEEDED FOR NAUSEA 05/04/16  Yes Sam Overbeck, PA-C  oxyCODONE (OXYCONTIN) 60 MG 12 hr tablet Take 120 mg by mouth every 12 (twelve) hours. 08/17/16  Yes Kaija Kovacevic, PA-C  pantoprazole (PROTONIX) 40 MG tablet Take 1 tablet (40 mg total) by mouth at bedtime. 06/20/16  Yes Cameren Earnest, PA-C  sertraline (ZOLOFT) 100 MG tablet Take 1 tablet (100 mg total) by mouth daily. 12/16/15  Yes Kadejah Sandiford, PA-C  valACYclovir (VALTREX) 1000 MG tablet Take 1 tablet (1,000 mg total) by mouth daily. 06/16/15  Yes Morrell Riddle, PA-C  lubiprostone (AMITIZA) 24 MCG capsule Take 1 capsule (24 mcg total) by mouth 2 (two) times daily with a meal. Patient not taking: Reported on 08/29/2016 07/06/15   Morrell Riddle, PA-C  naproxen sodium (ANAPROX DS) 550 MG tablet Take 1 tablet (550 mg total) by mouth 2 (two) times daily with a meal. Patient not taking: Reported on 08/29/2016 11/17/15 11/16/16  [name], MD     Allergies  Allergen Reactions  . Imitrex [Sumatriptan Base] Shortness Of Breath and Other (See Comments)    TACHYCARDIA  . Trazodone And Nefazodone Other (See Comments)    NIGHTMARES  . Wellbutrin [Bupropion] Other (See Comments)    "crawling out of my skin"  . Effexor [Venlafaxine] Other (See Comments)    MAKES HER "JITTERY"  . Metoclopramide Hcl Itching  . Morphine And Related Itching  . Tegretol [Carbamazepine] Itching  . Topamax Itching       Objective:  Physical Exam  Constitutional: She is oriented to person, place, and time. She appears well-developed and well-nourished. She is active and cooperative. No distress.  BP 116/84   Pulse (!) 102   Temp 98.6 F (37 C) (Oral)   Resp 16   Ht 5' 5.75" (1.67 m)   Wt 195 lb 3.2 oz (88.5 kg)   SpO2 100%   BMI 31.75 kg/m    Eyes:  Conjunctivae are normal.  Pulmonary/Chest: Effort normal.  Neurological: She is alert and oriented to person, place, and time.  Psychiatric: She has a normal mood and affect. Her speech is normal and behavior is normal.           Assessment & Plan:   1. Depression, unspecified depression type 2. Anxiety Stable. Anxiety is improving. Continue sertraline and Abilify. Continue tapering the alprazolam dose, Hope to be able to stop it. - ARIPiprazole (ABILIFY) 10 MG tablet; Take 1 tablet (10 mg total) by mouth daily.  Dispense: 90 tablet; Refill: 1 - alprazolam (XANAX) 2 MG tablet; Take 0.5-1 tablets (1-2 mg total) by mouth 2 (two) times daily as needed for sleep.  Dispense: 60 tablet; Refill: 0  3. Chronic pain disorder She's actually taking 60 mg QID, rather than 120 mg BID. She skips one dose every 3rd day. She'll plan to reduce again by skipping a dose QOD once she makes the next reduction in the alprazolam dose. - oxyCODONE (OXYCONTIN) 60 MG  12 hr tablet; Take 120 mg by mouth every 12 (twelve) hours.  Dispense: 120 each; Refill: 0 - Pain Mgmt, Profile 6 Conf w/o mM, U  4. Attention deficit disorder, unspecified hyperactivity presence Stable. - amphetamine-dextroamphetamine (ADDERALL) 20 MG tablet; Take 1 tablet (20 mg total) by mouth 3 (three) times daily.  Dispense: 90 tablet; Refill: 0  5. Flu vaccine need - Flu Vaccine QUAD 36+ mos IM    Return in about 10 weeks (around 11/07/2016).   [name], PA-C Physician Assistant-Certified [facility]

## 2016-08-29 NOTE — Patient Instructions (Signed)
   IF you received an x-ray today, you will receive an invoice from [facility]. Please contact [facility] at [phone] with questions or concerns regarding your invoice.   IF you received labwork today, you will receive an invoice from [facility]. Please contact [facility] at [phone] with questions or concerns regarding your invoice.   Our billing staff will not be able to assist you with questions regarding bills from these companies.  You will be contacted with the lab results as soon as they are available. The fastest way to get your results is to activate your My Chart account. Instructions are located on the last page of this paperwork. If you have not heard from us regarding the results in 2 weeks, please contact this office.    Influenza (Flu) Vaccine (Inactivated or Recombinant):  1. Why get vaccinated? Influenza ("flu") is a contagious disease that spreads around the [place] every year, usually between October and May. Flu is caused by influenza viruses, and is spread mainly by coughing, sneezing, and close contact. Anyone can get flu. Flu strikes suddenly and can last several days. Symptoms vary by age, but can include:  fever/chills  sore throat  muscle aches  fatigue  cough  headache  runny or stuffy nose Flu can also lead to pneumonia and blood infections, and cause diarrhea and seizures in children. If you have a medical condition, such as heart or lung disease, flu can make it worse. Flu is more dangerous for some people. Infants and young children, people 65 years of age and older, pregnant women, and people with certain health conditions or a weakened immune system are at greatest risk. Each year thousands of people in the [place] die from flu, and many more are hospitalized. Flu vaccine can:  keep you from getting flu,  make flu less severe if you do get it, and  keep you from spreading flu to  your family and other people. 2. Inactivated and recombinant flu vaccines A dose of flu vaccine is recommended every flu season. Children 6 months through 8 years of age may need two doses during the same flu season. Everyone else needs only one dose each flu season. Some inactivated flu vaccines contain a very small amount of a mercury-based preservative called thimerosal. Studies have not shown thimerosal in vaccines to be harmful, but flu vaccines that do not contain thimerosal are available. There is no live flu virus in flu shots. They cannot cause the flu. There are many flu viruses, and they are always changing. Each year a new flu vaccine is made to protect against three or four viruses that are likely to cause disease in the upcoming flu season. But even when the vaccine doesn't exactly match these viruses, it may still provide some protection. Flu vaccine cannot prevent:  flu that is caused by a virus not covered by the vaccine, or  illnesses that look like flu but are not. It takes about 2 weeks for protection to develop after vaccination, and protection lasts through the flu season. 3. Some people should not get this vaccine Tell the person who is giving you the vaccine:  If you have any severe, life-threatening allergies. If you ever had a life-threatening allergic reaction after a dose of flu vaccine, or have a severe allergy to any part of this vaccine, you may be advised not to get vaccinated. Most, but not all, types of flu vaccine contain a small amount of egg protein.    If you ever had Guillain-Barre Syndrome (also called GBS). Some people with a history of GBS should not get this vaccine. This should be discussed with your doctor.  If you are not feeling well. It is usually okay to get flu vaccine when you have a mild illness, but you might be asked to come back when you feel better. 4. Risks of a vaccine reaction With any medicine, including vaccines, there is a chance of  reactions. These are usually mild and go away on their own, but serious reactions are also possible. Most people who get a flu shot do not have any problems with it. Minor problems following a flu shot include:  soreness, redness, or swelling where the shot was given  hoarseness  sore, red or itchy eyes  cough  fever  aches  headache  itching  fatigue If these problems occur, they usually begin soon after the shot and last 1 or 2 days. More serious problems following a flu shot can include the following:  There may be a small increased risk of Guillain-Barre Syndrome (GBS) after inactivated flu vaccine. This risk has been estimated at 1 or 2 additional cases per million people vaccinated. This is much lower than the risk of severe complications from flu, which can be prevented by flu vaccine.  Young children who get the flu shot along with pneumococcal vaccine (PCV13) and/or DTaP vaccine at the same time might be slightly more likely to have a seizure caused by fever. Ask your doctor for more information. Tell your doctor if a child who is getting flu vaccine has ever had a seizure. Problems that could happen after any injected vaccine:  People sometimes faint after a medical procedure, including vaccination. Sitting or lying down for about 15 minutes can help prevent fainting, and injuries caused by a fall. Tell your doctor if you feel dizzy, or have vision changes or ringing in the ears.  Some people get severe pain in the shoulder and have difficulty moving the arm where a shot was given. This happens very rarely.  Any medication can cause a severe allergic reaction. Such reactions from a vaccine are very rare, estimated at about 1 in a million doses, and would happen within a few minutes to a few hours after the vaccination. As with any medicine, there is a very remote chance of a vaccine causing a serious injury or death. The safety of vaccines is always being monitored. For  more information, visit: www.cdc.gov/vaccinesafety/ 5. What if there is a serious reaction? What should I look for?  Look for anything that concerns you, such as signs of a severe allergic reaction, very high fever, or unusual behavior. Signs of a severe allergic reaction can include hives, swelling of the face and throat, difficulty breathing, a fast heartbeat, dizziness, and weakness. These would start a few minutes to a few hours after the vaccination. What should I do?  If you think it is a severe allergic reaction or other emergency that can't wait, call 9-1-1 and get the person to the nearest hospital. Otherwise, call your doctor.  Reactions should be reported to the Vaccine Adverse Event Reporting System (VAERS). Your doctor should file this report, or you can do it yourself through the VAERS web site at www.vaers.hhs.gov, or by calling 1-800-822-7967. VAERS does not give medical advice. 6. The National Vaccine Injury Compensation Program The National Vaccine Injury Compensation Program (VICP) is a federal program that was created to compensate people who may have been   injured by certain vaccines. Persons who believe they may have been injured by a vaccine can learn about the program and about filing a claim by calling 1-800-338-2382 or visiting the VICP website at www.hrsa.gov/vaccinecompensation. There is a time limit to file a claim for compensation. 7. How can I learn more?  Ask your healthcare provider. He or she can give you the vaccine package insert or suggest other sources of information.  Call your local or state health department.  Contact the Centers for Disease Control and Prevention (CDC):  Call 1-800-232-4636 (1-800-CDC-INFO) or  Visit CDC's website at www.cdc.gov/flu Vaccine Information Statement Inactivated Influenza Vaccine (06/12/2014)   This information is not intended to replace advice given to you by your health care provider. Make sure you discuss any  questions you have with your health care provider.   Document Released: 08/17/2006 Document Revised: 11/13/2014 Document Reviewed: 06/15/2014 Elsevier Interactive Patient Education 2016 Elsevier Inc.  

## 2016-09-03 LAB — PAIN MGMT, PROFILE 6 CONF W/O MM, U
6 ACETYLMORPHINE: NEGATIVE ng/mL (ref <10)
AMPHETAMINES: POSITIVE ng/mL — AB (ref <500)
Alcohol Metabolites: NEGATIVE ng/mL (ref <500)
Alphahydroxyalprazolam: 791 ng/mL — ABNORMAL HIGH (ref <25)
Alphahydroxymidazolam: NEGATIVE ng/mL (ref <50)
Alphahydroxytriazolam: NEGATIVE ng/mL (ref <50)
Aminoclonazepam: NEGATIVE ng/mL (ref <25)
Amphetamine: 9644 ng/mL — ABNORMAL HIGH (ref <250)
BARBITURATES: NEGATIVE ng/mL (ref <300)
Benzodiazepines: POSITIVE ng/mL — AB (ref <100)
Cocaine Metabolite: NEGATIVE ng/mL (ref <150)
Codeine: NEGATIVE ng/mL (ref <50)
Creatinine: 55.1 mg/dL (ref >=20.0)
HYDROMORPHONE: NEGATIVE ng/mL (ref <50)
Hydrocodone: NEGATIVE ng/mL (ref <50)
Hydroxyethylflurazepam: NEGATIVE ng/mL (ref <50)
LORAZEPAM: NEGATIVE ng/mL (ref <50)
MARIJUANA METABOLITE: NEGATIVE ng/mL (ref <20)
MORPHINE: NEGATIVE ng/mL (ref <50)
Methadone Metabolite: NEGATIVE ng/mL (ref <100)
Methamphetamine: NEGATIVE ng/mL (ref <250)
NORDIAZEPAM: NEGATIVE ng/mL (ref <50)
NORHYDROCODONE: NEGATIVE ng/mL (ref <50)
NOROXYCODONE: 7775 ng/mL — AB (ref <50)
OPIATES: NEGATIVE ng/mL (ref <100)
OXAZEPAM: NEGATIVE ng/mL (ref <50)
OXYCODONE: POSITIVE ng/mL — AB (ref <100)
Oxidant: NEGATIVE ug/mL (ref <200)
Oxycodone: 6950 ng/mL — ABNORMAL HIGH (ref <50)
Oxymorphone: 11343 ng/mL — ABNORMAL HIGH (ref <50)
PHENCYCLIDINE: NEGATIVE ng/mL (ref <25)
Please note:: 0
TEMAZEPAM: NEGATIVE ng/mL (ref <50)
pH: 6.51 (ref 4.5–9.0)

## 2016-10-03 ENCOUNTER — Telehealth: Payer: Self-pay | Admitting: Physician Assistant

## 2016-10-03 DIAGNOSIS — F988 Other specified behavioral and emotional disorders with onset usually occurring in childhood and adolescence: Secondary | ICD-10-CM

## 2016-10-03 DIAGNOSIS — G894 Chronic pain syndrome: Secondary | ICD-10-CM

## 2016-10-03 DIAGNOSIS — G8929 Other chronic pain: Secondary | ICD-10-CM

## 2016-10-03 DIAGNOSIS — M545 Low back pain: Secondary | ICD-10-CM

## 2016-10-03 DIAGNOSIS — F419 Anxiety disorder, unspecified: Secondary | ICD-10-CM

## 2016-10-03 MED ORDER — ALPRAZOLAM 2 MG PO TABS: 1.0000 mg | ORAL_TABLET | Freq: Two times a day (BID) | ORAL | 0 refills | Status: DC | PRN

## 2016-10-03 MED ORDER — OXYCODONE HCL ER 60 MG PO T12A: 120.0000 mg | EXTENDED_RELEASE_TABLET | Freq: Two times a day (BID) | ORAL | 0 refills | Status: DC

## 2016-10-03 MED ORDER — AMPHETAMINE-DEXTROAMPHETAMINE 20 MG PO TABS: 20.0000 mg | ORAL_TABLET | Freq: Three times a day (TID) | ORAL | 0 refills | Status: DC

## 2016-10-03 NOTE — Telephone Encounter (Signed)
Patient's wife presents for her own visit and asks to pick up patient's prescriptions while here to save a trip.  Meds ordered this encounter  Medications  . alprazolam (XANAX) 2 MG tablet    Sig: Take 0.5-1 tablets (1-2 mg total) by mouth 2 (two) times daily as needed for sleep.    Dispense:  60 tablet    Refill:  0    May fill 09/13/2016    Order Specific Question:   Supervising Provider    Answer:   Clelia CroftSHAW, EVA N [[id number]]  . amphetamine-dextroamphetamine (ADDERALL) 20 MG tablet    Sig: Take 1 tablet (20 mg total) by mouth 3 (three) times daily.    Dispense:  90 tablet    Refill:  0    May fill 09/13/2016    Order Specific Question:   Supervising Provider    Answer:   Clelia CroftSHAW, EVA N [[id number]]  . oxyCODONE (OXYCONTIN) 60 MG 12 hr tablet    Sig: Take 120 mg by mouth every 12 (twelve) hours.    Dispense:  120 each    Refill:  0    Order Specific Question:   Supervising Provider    Answer:   Clelia CroftSHAW, EVA N [[id number]]

## 2016-10-05 ENCOUNTER — Telehealth: Payer: Self-pay

## 2016-10-05 NOTE — Telephone Encounter (Signed)
Pt's ins does not cover any PPIs (plan exclusions) other than the 80 mg strength of pantoprazole which still needs a PA. Pt must pay cash for others. I imagine that pt has to have failed the OTC meds before the 80 mg would be approved, but not certain. Checked cash pricing of pantoprazole 40 on [employer] and printed coupon for pt and left in p/up box at 102. Notified pt of this on VM.

## 2016-10-06 ENCOUNTER — Telehealth: Payer: Self-pay

## 2016-10-06 NOTE — Telephone Encounter (Signed)
See notes on yesterday's message. I completed a PA for pt's wife [name]'s Rx for pantoprazole 40 BID and faxed to Whitney at [employer] after speaking with Whitney again today. She advised that the ins will approve PA for coverage of the 40 mg tabs as long as two a day are needed for total of 80 mg per day. However, this pt only takes the 40 mg QD and would be a plan exclusion. I had found pt a coupon and left her message advising this yesterday.   [name], do you want to increase pt's Rx to 40 mg with BID dosing which I can then do a PA on?

## 2016-10-06 NOTE — Telephone Encounter (Signed)
[facility] called on behalf of patient.  She is trying to get pre authorization for her protonics medication.  She is currently faxing over authorization papers.  Please advise  [phone]

## 2016-10-09 NOTE — Telephone Encounter (Signed)
It's fraud if I write the prescription for BID, knowing the patient only takes it QD, to save her money.  I am sorry that I don't understand. The insurance will grant PA for BID dosing but not QD dosing of this medication? Do they have a preferred product? They authorized the same for the patient's wife, but won't for her?

## 2016-10-10 NOTE — Telephone Encounter (Signed)
Correct. I was never my intention to prescribe the PPI 40 mg BID. However, if her gastroenterologist recommends that dosing, I would continue it on that advice.

## 2016-10-10 NOTE — Telephone Encounter (Signed)
I have not seen this type of coverage before either. Evidently after several conversations with ins rep, [name], the insurance requires pt to pay cash for OTC versions (or Rx) of PPIs if OTC versions are available of a similar med, I suppose with the reasoning that pt's symptoms can be controlled with OTC med. They will cover (with PA) if the pt has Rx for a total of 80 mg of pantoprazole per day whether in one 80 mg tab QD or 40 mg tab BID.  My thinking was the same as yours concerning Rxing for BID dosing if pt only needs QD dosing. I also didn't think you would want her actually taking a higher dose than is required for Sx control. [name] had made a comment that [name]'s Rx "should have been written for BID anyway", but I assume you would have her on the higher dose if pt's Sxs warranted that.

## 2016-10-10 NOTE — Telephone Encounter (Signed)
Called and spoke to pt and explained [name]'s message. Also advised again of the coupon I had printed for her and left for p/up, or that she can print one herself from [employer] to bring the cost of paying cash pretty low. Pt agreed to do that.

## 2016-11-07 ENCOUNTER — Encounter: Payer: Self-pay | Admitting: Physician Assistant

## 2016-11-07 ENCOUNTER — Ambulatory Visit (INDEPENDENT_AMBULATORY_CARE_PROVIDER_SITE_OTHER): Payer: Commercial Managed Care - PPO | Admitting: Physician Assistant

## 2016-11-07 ENCOUNTER — Telehealth: Payer: Self-pay

## 2016-11-07 VITALS — BP 116/75 | HR 94 | Temp 98.0°F | Ht 65.75 in | Wt 199.8 lb

## 2016-11-07 DIAGNOSIS — G8929 Other chronic pain: Secondary | ICD-10-CM | POA: Diagnosis not present

## 2016-11-07 DIAGNOSIS — F419 Anxiety disorder, unspecified: Secondary | ICD-10-CM | POA: Diagnosis not present

## 2016-11-07 DIAGNOSIS — M545 Low back pain, unspecified: Secondary | ICD-10-CM

## 2016-11-07 DIAGNOSIS — M5412 Radiculopathy, cervical region: Secondary | ICD-10-CM | POA: Diagnosis not present

## 2016-11-07 DIAGNOSIS — K222 Esophageal obstruction: Secondary | ICD-10-CM | POA: Diagnosis not present

## 2016-11-07 DIAGNOSIS — G894 Chronic pain syndrome: Secondary | ICD-10-CM

## 2016-11-07 DIAGNOSIS — F988 Other specified behavioral and emotional disorders with onset usually occurring in childhood and adolescence: Secondary | ICD-10-CM | POA: Diagnosis not present

## 2016-11-07 DIAGNOSIS — M542 Cervicalgia: Secondary | ICD-10-CM

## 2016-11-07 MED ORDER — OXYCODONE HCL ER 60 MG PO T12A: 120.0000 mg | EXTENDED_RELEASE_TABLET | Freq: Two times a day (BID) | ORAL | 0 refills | Status: DC

## 2016-11-07 MED ORDER — PANTOPRAZOLE SODIUM 40 MG PO TBEC: 40.0000 mg | DELAYED_RELEASE_TABLET | Freq: Every day | ORAL | 3 refills | Status: DC

## 2016-11-07 MED ORDER — AMPHETAMINE-DEXTROAMPHETAMINE 20 MG PO TABS: 20.0000 mg | ORAL_TABLET | Freq: Three times a day (TID) | ORAL | 0 refills | Status: DC

## 2016-11-07 MED ORDER — ALPRAZOLAM 2 MG PO TABS: 1.0000 mg | ORAL_TABLET | Freq: Two times a day (BID) | ORAL | 0 refills | Status: DC | PRN

## 2016-11-07 MED ORDER — PANTOPRAZOLE SODIUM 40 MG PO TBEC: 40.0000 mg | DELAYED_RELEASE_TABLET | Freq: Two times a day (BID) | ORAL | 3 refills | Status: DC

## 2016-11-07 NOTE — Telephone Encounter (Signed)
[name] asked me to start working on PAs for pt . She needs one for pantoprazole which has been increased to 40 mg BID. I called [employer] and they have faxed me a form which I have filled out and faxed back. I also sent corrected Rx  and let pharm know to DC the one sent in w/sig for QD w/ amount of #180. Sig should be BID. Received approval of pantoprazole through 05/07/17. Pt has tried/failed the following in the past pantoprazole 40 mg QD, prilosec 40 , nexium 40, lansoprazole 30, aciphex 20.  Completed PA on covermymeds for Oxycontin. Pending. Pt has tried/failed many pain meds previously, see list in med hx in EPIC

## 2016-11-07 NOTE — Patient Instructions (Addendum)
  Keep working on reducing the opiate and alprazolam as you are able.   IF you received an x-ray today, you will receive an invoice from [facility]. Please contact [facility] at [phone] with questions or concerns regarding your invoice.   IF you received labwork today, you will receive an invoice from [facility]. Please contact [facility] at [phone] with questions or concerns regarding your invoice.   Our billing staff will not be able to assist you with questions regarding bills from these companies.  You will be contacted with the lab results as soon as they are available. The fastest way to get your results is to activate your My Chart account. Instructions are located on the last page of this paperwork. If you have not heard from us regarding the results in 2 weeks, please contact this office.

## 2016-11-07 NOTE — Telephone Encounter (Signed)
Oxycontin was also approved through 11/07/17, case # [id number]. Notified pt of both approvals.

## 2016-11-07 NOTE — Progress Notes (Signed)
Patient ID: [name], female    DOB: 1971-06-21, 46 y.o.   MRN: [id number]  PCP: Porfirio Oar, PA-C  Chief Complaint  Patient presents with  . Follow-up    Medication refill -Xanax, Oxycontin and Adderall    Subjective:   Presents for medication refills.  Overall, she's stable. No new problems or concerns.  [place]. All fills known to me. UDS to be updated today due to high risk for opioid use.  Alprazolam only at HS. Oxycodone (Oxycontin). Often doesn't take a dose if she isn't working and can manage the pain. [facility] [employer] needs prior authorization for Oxycontin going forward. Recall that gabapentin was ineffective.  Feels like she needs esophagus stretched again. Known stricture. Previous stretching with Dr. Arlyce Dice 2013. Needing Pantoprazole 40 mg BID to maintain symptom control. Having difficulty getting her insurance to cover that-needs prior authorization.    Review of Systems Chronic back and neck pain. Chronic anxiety. Benzodiazepine at HS. Difficulty with concentration, addressed well with Adderall.    Patient Active Problem List   Diagnosis Date Noted  . 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 IBS (irritable bowel syndrome) 06/03/2014  . Anxiety 03/09/2014  . Obesity (BMI 30-39.9) 10/27/2013  . Neck pain on left side 09/02/2013  . Cervical radiculopathy 08/18/2013  . Chronic pain disorder 08/16/2013  . Drug-seeking behavior 08/16/2013  . Lumbar back pain 11/21/2012  . Esophageal stricture 11/21/2012  . ADD (attention deficit disorder) 11/21/2012  . Hypertriglyceridemia 11/21/2012  . Carpal tunnel syndrome, bilateral 07/05/2012  . OSA (obstructive sleep apnea) 06/28/2012  . Constipation 01/01/2012  . Migraine headache 03/02/2011  . Depression 03/02/2011  . History of substance abuse 03/02/2011  . Hypertension 03/02/2011     Prior to Admission medications   Medication Sig Start Date End Date Taking? Authorizing Provider  acetaminophen  (TYLENOL) 500 MG tablet Take 1,000 mg by mouth every 6 (six) hours as needed for moderate pain.   Yes Historical Provider, MD  alprazolam Prudy Feeler) 2 MG tablet Take 0.5-1 tablets (1-2 mg total) by mouth 2 (two) times daily as needed for sleep. 10/03/16  Yes Maripaz Mullan, PA-C  amLODipine (NORVASC) 10 MG tablet TAKE 1 TABLET (10 MG TOTAL) BY MOUTH AT BEDTIME. 08/13/16  Yes Jahzir Strohmeier, PA-C  amphetamine-dextroamphetamine (ADDERALL) 20 MG tablet Take 1 tablet (20 mg total) by mouth 3 (three) times daily. 10/03/16  Yes Jaydy Fitzhenry, PA-C  ARIPiprazole (ABILIFY) 10 MG tablet Take 1 tablet (10 mg total) by mouth daily. 08/29/16  Yes Charlyn Vialpando, PA-C  atorvastatin (LIPITOR) 20 MG tablet Take 1 tablet (20 mg total) by mouth daily. 07/05/16  Yes Antonya Leeder, PA-C  ciprofloxacin-hydrocortisone (CIPRO HC) otic suspension Place 3 drops into the right ear 2 (two) times daily. 07/06/15  Yes Morrell Riddle, PA-C  EPINEPHrine (EPIPEN) 0.3 mg/0.3 mL IJ SOAJ injection Inject 0.3 mLs (0.3 mg total) into the muscle once as needed (Anaphylaxis). 06/20/16  Yes Tamana Hatfield, PA-C  EPINEPHrine 0.3 mg/0.3 mL IJ SOAJ injection Inject into the muscle.   Yes Historical Provider, MD  ibuprofen (ADVIL,MOTRIN) 200 MG tablet Take 600 mg by mouth 2 (two) times daily as needed for pain.   Yes Historical Provider, MD  naproxen sodium (ANAPROX DS) 550 MG tablet Take 1 tablet (550 mg total) by mouth 2 (two) times daily with a meal. 11/17/15 11/16/16 Yes [name], MD  ondansetron (ZOFRAN-ODT) 8 MG disintegrating tablet DISSOLVE 1 TABLET BY MOUTH EVERY 8 HOURS AS NEEDED FOR NAUSEA 05/04/16  Yes Havish Petties, PA-C  oxyCODONE (OXYCONTIN) 60 MG 12 hr tablet Take 120 mg by mouth every 12 (twelve) hours. 10/03/16  Yes Janye Maynor, PA-C  pantoprazole (PROTONIX) 40 MG tablet Take 1 tablet (40 mg total) by mouth at bedtime. 06/20/16  Yes Yen Wandell, PA-C  sertraline (ZOLOFT) 100 MG tablet Take 1 tablet (100 mg total) by  mouth daily. 12/16/15  Yes Jebidiah Baggerly, PA-C  valACYclovir (VALTREX) 1000 MG tablet Take 1 tablet (1,000 mg total) by mouth daily. 06/16/15  Yes Morrell Riddle, PA-C  lubiprostone (AMITIZA) 24 MCG capsule Take 1 capsule (24 mcg total) by mouth 2 (two) times daily with a meal. Patient not taking: Reported on 11/07/2016 07/06/15   Morrell Riddle, PA-C     Allergies  Allergen Reactions  . Imitrex [Sumatriptan Base] Shortness Of Breath and Other (See Comments)    TACHYCARDIA  . Trazodone And Nefazodone Other (See Comments)    NIGHTMARES  . Wellbutrin [Bupropion] Other (See Comments)    "crawling out of my skin"  . Effexor [Venlafaxine] Other (See Comments)    MAKES HER "JITTERY"  . Metoclopramide Hcl Itching  . Morphine And Related Itching  . Tegretol [Carbamazepine] Itching  . Topamax Itching       Objective:  Physical Exam  Constitutional: She is oriented to person, place, and time. She appears well-developed and well-nourished. She is active and cooperative. No distress.  BP 116/75 (BP Location: Right Arm, Patient Position: Sitting, Cuff Size: Small)   Pulse 94   Temp 98 F (36.7 C) (Oral)   Ht 5' 5.75" (1.67 m)   Wt 199 lb 12.8 oz (90.6 kg)   SpO2 99%   BMI 32.49 kg/m   HENT:  Head: Normocephalic and atraumatic.  Right Ear: Hearing normal.  Left Ear: Hearing normal.  Eyes: Conjunctivae are normal. No scleral icterus.  Neck: Normal range of motion. Neck supple. No thyromegaly present.  Cardiovascular: Normal rate, regular rhythm and normal heart sounds.   Pulses:      Radial pulses are 2+ on the right side, and 2+ on the left side.  Pulmonary/Chest: Effort normal and breath sounds normal.  Musculoskeletal:       Lumbar back: She exhibits decreased range of motion, tenderness (L>R) and bony tenderness.  Lymphadenopathy:       Head (right side): No tonsillar, no preauricular, no posterior auricular and no occipital adenopathy present.       Head (left side): No tonsillar, no  preauricular, no posterior auricular and no occipital adenopathy present.    She has no cervical adenopathy.       Right: No supraclavicular adenopathy present.       Left: No supraclavicular adenopathy present.  Neurological: She is alert and oriented to person, place, and time. No sensory deficit.  Skin: Skin is warm, dry and intact. No rash noted. No cyanosis or erythema. Nails show no clubbing.  Psychiatric: She has a normal mood and affect. Her speech is normal and behavior is normal.   Wt Readings from Last 3 Encounters:  11/07/16 199 lb 12.8 oz (90.6 kg)  08/29/16 195 lb 3.2 oz (88.5 kg)  08/17/16 195 lb 12.8 oz (88.8 kg)           Assessment & Plan:   1. Chronic pain disorder 2. Neck pain on left side 3. Chronic midline low back pain without sciatica 4. Cervical radiculopathy Update UDS today. Continue efforts to reduce doses. Consider addition of pregabalin. - ToxASSURE Select  13 (MW), Urine - oxyCODONE (OXYCONTIN) 60 MG 12 hr tablet; Take 120 mg by mouth every 12 (twelve) hours.  Dispense: 120 each; Refill: 0 - oxyCODONE (OXYCONTIN) 60 MG 12 hr tablet; Take 120 mg by mouth every 12 (twelve) hours.  Dispense: 120 each; Refill: 0 - oxyCODONE (OXYCONTIN) 60 MG 12 hr tablet; Take 120 mg by mouth every 12 (twelve) hours.  Dispense: 120 each; Refill: 0  - oxyCODONE (OXYCONTIN) 60 MG 12 hr tablet; Take 120 mg by mouth every 12 (twelve) hours.  Dispense: 120 each; Refill: 0 - oxyCODONE (OXYCONTIN) 60 MG 12 hr tablet; Take 120 mg by mouth every 12 (twelve) hours.  Dispense: 120 each; Refill: 0 - oxyCODONE (OXYCONTIN) 60 MG 12 hr tablet; Take 120 mg by mouth every 12 (twelve) hours.  Dispense: 120 each; Refill: 0  5. Attention deficit disorder (ADD) without hyperactivity Controlled. - amphetamine-dextroamphetamine (ADDERALL) 20 MG tablet; Take 1 tablet (20 mg total) by mouth 3 (three) times daily.  Dispense: 90 tablet; Refill: 0 - amphetamine-dextroamphetamine (ADDERALL) 20 MG  tablet; Take 1 tablet (20 mg total) by mouth 3 (three) times daily.  Dispense: 90 tablet; Refill: 0 - amphetamine-dextroamphetamine (ADDERALL) 20 MG tablet; Take 1 tablet (20 mg total) by mouth 3 (three) times daily.  Dispense: 90 tablet; Refill: 0  6. Anxiety Controlled. Encouraged reduced dose at HS as able. - alprazolam (XANAX) 2 MG tablet; Take 0.5-1 tablets (1-2 mg total) by mouth 2 (two) times daily as needed for sleep.  Dispense: 60 tablet; Refill: 0 - alprazolam (XANAX) 2 MG tablet; Take 0.5-1 tablets (1-2 mg total) by mouth 2 (two) times daily as needed for sleep.  Dispense: 60 tablet; Refill: 0 - alprazolam (XANAX) 2 MG tablet; Take 0.5-1 tablets (1-2 mg total) by mouth 2 (two) times daily as needed for sleep.  Dispense: 60 tablet; Refill: 0  7. Esophageal stricture INCREASE pantoprazole 40 mg to BID. Establish with new GI specialist as Dr. Arlyce Dice has retired. - Ambulatory referral to Gastroenterology  Return in about 3 months (around 02/05/2017).   [name], PA-C Physician Assistant-Certified [facility]

## 2016-11-12 ENCOUNTER — Other Ambulatory Visit: Payer: Self-pay | Admitting: Physician Assistant

## 2016-11-12 LAB — TOXASSURE SELECT 13 (MW), URINE

## 2016-12-31 ENCOUNTER — Other Ambulatory Visit: Payer: Self-pay | Admitting: Physician Assistant

## 2016-12-31 DIAGNOSIS — F329 Major depressive disorder, single episode, unspecified: Secondary | ICD-10-CM

## 2016-12-31 DIAGNOSIS — F32A Depression, unspecified: Secondary | ICD-10-CM

## 2017-01-16 ENCOUNTER — Telehealth: Payer: Self-pay | Admitting: Physician Assistant

## 2017-01-16 NOTE — Telephone Encounter (Signed)
oxycontin is approved through 11/07/2017. PA# [id number].  Still working on the alprazolam.  I contacted patient and she was very Adult nurseappreciative.

## 2017-01-16 NOTE — Telephone Encounter (Signed)
I have performed several drug screens on this patient due to her high risk of benzo and opiate usage.  Her insurance isn't paying for them, and she is wondering if there is some way we can get prior authorization

## 2017-01-18 NOTE — Telephone Encounter (Signed)
[name], [employer] insurance again and they said alprazolam is being denied due to a drug interaction between opioids and benzos.  I again inquired about the drug screening and they again declined to pay.  I did not want to call patient until you were aware. Please advise, Conall Vangorder

## 2017-01-18 NOTE — Telephone Encounter (Signed)
[name], I called the insurance co to check on the status of alprazolam and ask about the drug screening.  They do not pay for drug screening even though that is part of patient's treatment plan. They still have not reached a decision on the alprazolam.  I do not understand the reason it is taking so long, and upon inquiry the representative could not explain it to me.  I am going to try one more thing to get the PA and if it works we should have an answer of approval denial.  I will notify patient of outcome. Thank you, Caton Popowski

## 2017-01-18 NOTE — Telephone Encounter (Signed)
Thank you, Renay. Can you find out about the coverage of the DRUG SCREEN?

## 2017-01-19 NOTE — Telephone Encounter (Signed)
Thank you, Renay.  I will send the patient a message.  Warmly, Azaylia Fong

## 2017-01-28 ENCOUNTER — Encounter: Payer: Self-pay | Admitting: Physician Assistant

## 2017-01-28 DIAGNOSIS — F988 Other specified behavioral and emotional disorders with onset usually occurring in childhood and adolescence: Secondary | ICD-10-CM

## 2017-01-28 DIAGNOSIS — F419 Anxiety disorder, unspecified: Secondary | ICD-10-CM

## 2017-01-28 DIAGNOSIS — G894 Chronic pain syndrome: Secondary | ICD-10-CM

## 2017-01-28 DIAGNOSIS — M545 Low back pain: Secondary | ICD-10-CM

## 2017-01-28 DIAGNOSIS — G8929 Other chronic pain: Secondary | ICD-10-CM

## 2017-01-29 MED ORDER — ALPRAZOLAM 2 MG PO TABS: 1.0000 mg | ORAL_TABLET | Freq: Two times a day (BID) | ORAL | 0 refills | Status: DC | PRN

## 2017-01-29 MED ORDER — AMPHETAMINE-DEXTROAMPHETAMINE 20 MG PO TABS: 20.0000 mg | ORAL_TABLET | Freq: Three times a day (TID) | ORAL | 0 refills | Status: DC

## 2017-01-29 MED ORDER — OXYCODONE HCL ER 60 MG PO T12A: 120.0000 mg | EXTENDED_RELEASE_TABLET | Freq: Two times a day (BID) | ORAL | 0 refills | Status: DC

## 2017-01-29 NOTE — Telephone Encounter (Signed)
Patient notified via My Chart.  Meds ordered this encounter  Medications  . alprazolam (XANAX) 2 MG tablet    Sig: Take 0.5-1 tablets (1-2 mg total) by mouth 2 (two) times daily as needed for sleep.    Dispense:  60 tablet    Refill:  0    Order Specific Question:   Supervising Provider    Answer:   SHAW, EVA N [[id number]]  . amphetamine-dextroamphetamine (ADDERALL) 20 MG tablet    Sig: Take 1 tablet (20 mg total) by mouth 3 (three) times daily.    Dispense:  90 tablet    Refill:  0    Order Specific Question:   Supervising Provider    Answer:   SHAW, EVA N [[id number]]  . oxyCODONE (OXYCONTIN) 60 MG 12 hr tablet    Sig: Take 120 mg by mouth every 12 (twelve) hours.    Dispense:  120 each    Refill:  0    Order Specific Question:   Supervising Provider    Answer:   Clelia CroftSHAW, EVA N [[id number]]  . alprazolam (XANAX) 2 MG tablet    Sig: Take 0.5-1 tablets (1-2 mg total) by mouth 2 (two) times daily as needed for sleep.    Dispense:  60 tablet    Refill:  0    May fill 30 days after date on prescription    Order Specific Question:   Supervising Provider    Answer:   Clelia CroftSHAW, EVA N [[id number]]  . amphetamine-dextroamphetamine (ADDERALL) 20 MG tablet    Sig: Take 1 tablet (20 mg total) by mouth 3 (three) times daily.    Dispense:  90 tablet    Refill:  0    May fill 30 days after date on prescription    Order Specific Question:   Supervising Provider    Answer:   Clelia CroftSHAW, EVA N [[id number]]  . oxyCODONE (OXYCONTIN) 60 MG 12 hr tablet    Sig: Take 120 mg by mouth every 12 (twelve) hours.    Dispense:  120 each    Refill:  0    May fill 30 days after date on prescription    Order Specific Question:   Supervising Provider    Answer:   Clelia CroftSHAW, EVA N [[id number]]  . alprazolam (XANAX) 2 MG tablet    Sig: Take 0.5-1 tablets (1-2 mg total) by mouth 2 (two) times daily as needed for sleep.    Dispense:  60 tablet    Refill:  0    May fill 60 days after date on prescription    Order Specific Question:   Supervising Provider   Answer:   Clelia CroftSHAW, EVA N [[id number]]  . amphetamine-dextroamphetamine (ADDERALL) 20 MG tablet    Sig: Take 1 tablet (20 mg total) by mouth 3 (three) times daily.    Dispense:  90 tablet    Refill:  0    May fill 60 days after date on prescription    Order Specific Question:   Supervising Provider    Answer:   Clelia CroftSHAW, EVA N [[id number]]  . oxyCODONE (OXYCONTIN) 60 MG 12 hr tablet    Sig: Take 120 mg by mouth every 12 (twelve) hours.    Dispense:  120 each    Refill:  0    May fill 60 days after date on prescription    Order Specific Question:   Supervising Provider    Answer:   Clelia CroftSHAW, EVA N [[id number]]

## 2017-02-02 ENCOUNTER — Encounter: Payer: Self-pay | Admitting: Physician Assistant

## 2017-02-02 ENCOUNTER — Telehealth: Payer: Self-pay | Admitting: Physician Assistant

## 2017-02-02 NOTE — Telephone Encounter (Signed)
Letter sent in My Chart and printed (for my original signature). Patient notified via My Chart.

## 2017-02-02 NOTE — Telephone Encounter (Signed)
PATIENT WOULD LIKE THIS MESSAGE TO GO TO [name]: SHE IS TRYING TO GET A NEW JOB AT [employer]. SHE HAD TO TAKE A PRE-EMPLOYMENT DRUG SCREEN. THE RESULTS SHOWED UP WITH HER TAKING XANAX, OXYCODONE AND ADDERALL. SHE NEEDS [name] TO WRITE HER A LETTER STATING THAT SHE PRESCRIBES THESE MEDICATIONS FOR HER. BEST PHONE [phone] (CELL) MBC

## 2017-02-13 ENCOUNTER — Ambulatory Visit: Payer: Commercial Managed Care - PPO | Admitting: Physician Assistant

## 2017-02-14 ENCOUNTER — Other Ambulatory Visit: Payer: Self-pay | Admitting: Physician Assistant

## 2017-02-15 ENCOUNTER — Ambulatory Visit: Payer: Commercial Managed Care - PPO

## 2017-02-15 ENCOUNTER — Ambulatory Visit (INDEPENDENT_AMBULATORY_CARE_PROVIDER_SITE_OTHER): Payer: Commercial Managed Care - PPO | Admitting: Physician Assistant

## 2017-02-15 VITALS — BP 126/85 | HR 98 | Temp 98.2°F | Resp 16 | Ht 65.0 in | Wt 203.2 lb

## 2017-02-15 DIAGNOSIS — F988 Other specified behavioral and emotional disorders with onset usually occurring in childhood and adolescence: Secondary | ICD-10-CM

## 2017-02-15 DIAGNOSIS — J302 Other seasonal allergic rhinitis: Secondary | ICD-10-CM | POA: Diagnosis not present

## 2017-02-15 DIAGNOSIS — G894 Chronic pain syndrome: Secondary | ICD-10-CM | POA: Diagnosis not present

## 2017-02-15 DIAGNOSIS — F419 Anxiety disorder, unspecified: Secondary | ICD-10-CM | POA: Diagnosis not present

## 2017-02-15 MED ORDER — AMPHETAMINE-DEXTROAMPHETAMINE 20 MG PO TABS: 20.0000 mg | ORAL_TABLET | Freq: Three times a day (TID) | ORAL | 0 refills | Status: DC

## 2017-02-15 MED ORDER — AMPHETAMINE-DEXTROAMPHETAMINE 20 MG PO TABS
20.0000 mg | ORAL_TABLET | Freq: Three times a day (TID) | ORAL | 0 refills | Status: DC
Start: 2017-02-15 — End: 2017-08-14

## 2017-02-15 MED ORDER — OXYCODONE HCL ER 60 MG PO T12A: 120.0000 mg | EXTENDED_RELEASE_TABLET | Freq: Two times a day (BID) | ORAL | 0 refills | Status: DC

## 2017-02-15 MED ORDER — ALPRAZOLAM 2 MG PO TABS: 1.0000 mg | ORAL_TABLET | Freq: Two times a day (BID) | ORAL | 0 refills | Status: DC | PRN

## 2017-02-15 MED ORDER — MONTELUKAST SODIUM 10 MG PO TABS: 10.0000 mg | ORAL_TABLET | Freq: Every day | ORAL | 3 refills | Status: DC

## 2017-02-15 MED ORDER — ONDANSETRON 8 MG PO TBDP: ORAL_TABLET | ORAL | 0 refills | Status: DC

## 2017-02-15 NOTE — Progress Notes (Signed)
Patient ID: [name], female    DOB: [birth year], 46 y.o.   MRN: [id number]  PCP: Porfirio Oar, PA-C  Chief Complaint  Patient presents with  . Medication Refill    Alprazolam 2 mg, Adderall 20 mg, Oxycodone 60 mg    Subjective:   Presents for medication refills. She is accompanied by her wife.  Is doing well. Continues to skip oxycodone doses when she isn't hurting, 3-4 times a week. Had been exercising regularly, but then got out of the routine while she was looking at a new job. That didn't work out, so she is getting back into her schedule as before. Anxiety is controlled. ADD is controlled. Pain is adequately controlled with her current treatment. She is tolerating the medications well.  [place] reviewed. All prescriptions known to and written by me. She is high risk, so UDS performed quarterly.    Review of Systems No chest pain, SOB, HA, dizziness, vision change, N/V, diarrhea, constipation, dysuria, urinary urgency or frequency, new myalgias, new arthralgias or rash.     Patient Active Problem List   Diagnosis Date Noted  . 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 IBS (irritable bowel syndrome) 06/03/2014  . Anxiety 03/09/2014  . Obesity (BMI 30-39.9) 10/27/2013  . Neck pain on left side 09/02/2013  . Cervical radiculopathy 08/18/2013  . Chronic pain disorder 08/16/2013  . Drug-seeking behavior 08/16/2013  . Lumbar back pain 11/21/2012  . Esophageal stricture 11/21/2012  . ADD (attention deficit disorder) 11/21/2012  . Hypertriglyceridemia 11/21/2012  . Carpal tunnel syndrome, bilateral 07/05/2012  . OSA (obstructive sleep apnea) 06/28/2012  . Constipation 01/01/2012  . Migraine headache 03/02/2011  . Depression 03/02/2011  . History of substance abuse 03/02/2011  . Hypertension 03/02/2011     Prior to Admission medications   Medication Sig Start Date End Date Taking? Authorizing Provider  acetaminophen (TYLENOL) 500 MG tablet Take 1,000 mg by mouth  every 6 (six) hours as needed for moderate pain.   Yes Historical Provider, MD  alprazolam Prudy Feeler) 2 MG tablet Take 0.5-1 tablets (1-2 mg total) by mouth 2 (two) times daily as needed for sleep. 01/29/17  Yes Chanele Douglas, PA-C  alprazolam (XANAX) 2 MG tablet Take 0.5-1 tablets (1-2 mg total) by mouth 2 (two) times daily as needed for sleep. 01/29/17  Yes Margerie Fraiser, PA-C  alprazolam (XANAX) 2 MG tablet Take 0.5-1 tablets (1-2 mg total) by mouth 2 (two) times daily as needed for sleep. 01/29/17  Yes Ab Leaming, PA-C  amLODipine (NORVASC) 10 MG tablet TAKE 1 TABLET BY MOUTH AT BEDTIME. (NEED APPOINTMENT FOR REFILLS) 02/14/17  Yes Orean Giarratano, PA-C  amphetamine-dextroamphetamine (ADDERALL) 20 MG tablet Take 1 tablet (20 mg total) by mouth 3 (three) times daily. 01/29/17  Yes Vashon Riordan, PA-C  amphetamine-dextroamphetamine (ADDERALL) 20 MG tablet Take 1 tablet (20 mg total) by mouth 3 (three) times daily. 01/29/17  Yes Shraddha Lebron, PA-C  amphetamine-dextroamphetamine (ADDERALL) 20 MG tablet Take 1 tablet (20 mg total) by mouth 3 (three) times daily. 01/29/17  Yes Senetra Dillin, PA-C  ARIPiprazole (ABILIFY) 10 MG tablet Take 1 tablet (10 mg total) by mouth daily. 08/29/16  Yes Lindsay Straka, PA-C  atorvastatin (LIPITOR) 20 MG tablet Take 1 tablet (20 mg total) by mouth daily. 07/05/16  Yes Khaleelah Yowell, PA-C  ciprofloxacin-hydrocortisone (CIPRO HC) otic suspension Place 3 drops into the right ear 2 (two) times daily. 07/06/15  Yes Morrell Riddle, PA-C  EPINEPHrine (EPIPEN) 0.3 mg/0.3 mL IJ SOAJ injection  Inject 0.3 mLs (0.3 mg total) into the muscle once as needed (Anaphylaxis). 06/20/16  Yes Mandeep Ferch, PA-C  EPINEPHrine 0.3 mg/0.3 mL IJ SOAJ injection Inject into the muscle.   Yes Historical Provider, MD  ibuprofen (ADVIL,MOTRIN) 200 MG tablet Take 600 mg by mouth 2 (two) times daily as needed for pain.   Yes Historical Provider, MD  oxyCODONE (OXYCONTIN) 60 MG 12 hr tablet Take 120 mg  by mouth every 12 (twelve) hours. 01/29/17  Yes Kytzia Gienger, PA-C  oxyCODONE (OXYCONTIN) 60 MG 12 hr tablet Take 120 mg by mouth every 12 (twelve) hours. 01/29/17  Yes Thana Ramp, PA-C  oxyCODONE (OXYCONTIN) 60 MG 12 hr tablet Take 120 mg by mouth every 12 (twelve) hours. 01/29/17  Yes Genese Quebedeaux, PA-C  pantoprazole (PROTONIX) 40 MG tablet Take 1 tablet (40 mg total) by mouth 2 (two) times daily. 11/07/16  Yes Nikola Blackston, PA-C  sertraline (ZOLOFT) 100 MG tablet TAKE 1 TABLET BY MOUTH EVERY DAY 01/01/17  Yes Sheilah Rayos, PA-C  valACYclovir (VALTREX) 1000 MG tablet Take 1 tablet (1,000 mg total) by mouth daily. 06/16/15  Yes Morrell Riddle, PA-C  lubiprostone (AMITIZA) 24 MCG capsule Take 1 capsule (24 mcg total) by mouth 2 (two) times daily with a meal. Patient not taking: Reported on 11/07/2016 07/06/15   Morrell Riddle, PA-C  ondansetron (ZOFRAN-ODT) 8 MG disintegrating tablet DISSOLVE 1 TABLET BY MOUTH EVERY 8 HOURS AS NEEDED FOR NAUSEA Patient not taking: Reported on 02/15/2017 05/04/16   Porfirio Oar, PA-C     Allergies  Allergen Reactions  . Imitrex [Sumatriptan Base] Shortness Of Breath and Other (See Comments)    TACHYCARDIA  . Trazodone And Nefazodone Other (See Comments)    NIGHTMARES  . Wellbutrin [Bupropion] Other (See Comments)    "crawling out of my skin"  . Effexor [Venlafaxine] Other (See Comments)    MAKES HER "JITTERY"  . Metoclopramide Hcl Itching  . Morphine And Related Itching  . Tegretol [Carbamazepine] Itching  . Topamax Itching       Objective:  Physical Exam  Constitutional: She is oriented to person, place, and time. She appears well-developed and well-nourished. She is active and cooperative. No distress.  BP 126/85   Pulse 98   Temp 98.2 F (36.8 C) (Oral)   Resp 16   Ht 5\' 5"  (1.651 m)   Wt 203 lb 3.2 oz (92.2 kg)   SpO2 100%   BMI 33.81 kg/m   HENT:  Head: Normocephalic and atraumatic.  Right Ear: Hearing normal.  Left Ear: Hearing  normal.  Eyes: Conjunctivae are normal. No scleral icterus.  Neck: Normal range of motion. Neck supple. No thyromegaly present.  Cardiovascular: Normal rate, regular rhythm and normal heart sounds.   Pulses:      Radial pulses are 2+ on the right side, and 2+ on the left side.  Pulmonary/Chest: Effort normal and breath sounds normal.  Lymphadenopathy:       Head (right side): No tonsillar, no preauricular, no posterior auricular and no occipital adenopathy present.       Head (left side): No tonsillar, no preauricular, no posterior auricular and no occipital adenopathy present.    She has no cervical adenopathy.       Right: No supraclavicular adenopathy present.       Left: No supraclavicular adenopathy present.  Neurological: She is alert and oriented to person, place, and time. No sensory deficit.  Skin: Skin is warm, dry and intact. No rash noted.  No cyanosis or erythema. Nails show no clubbing.  Psychiatric: She has a normal mood and affect. Her speech is normal and behavior is normal.           Assessment & Plan:   Problem List Items Addressed This Visit    ADD (attention deficit disorder) (Chronic)    Well controlled on current regimen.       Relevant Medications   amphetamine-dextroamphetamine (ADDERALL) 20 MG tablet   amphetamine-dextroamphetamine (ADDERALL) 20 MG tablet   amphetamine-dextroamphetamine (ADDERALL) 20 MG tablet   Chronic pain disorder - Primary    Update UDS. Continue current treatment. She is very aware of the risks associated with chronic opiate use, more so with simultaneous benzodiazepine use.      Relevant Medications   oxyCODONE (OXYCONTIN) 60 MG 12 hr tablet   oxyCODONE (OXYCONTIN) 60 MG 12 hr tablet   oxyCODONE (OXYCONTIN) 60 MG 12 hr tablet   Other Relevant Orders   ToxASSURE Select 13 (MW), Urine   Anxiety    Well controlled on her current regimen. Higher risk treatment due to chronic opiate use.       Relevant Medications    alprazolam (XANAX) 2 MG tablet   alprazolam (XANAX) 2 MG tablet   alprazolam (XANAX) 2 MG tablet    Other Visit Diagnoses    Seasonal allergic rhinitis, unspecified chronicity, unspecified trigger       Relevant Medications   montelukast (SINGULAIR) 10 MG tablet       Return in about 3 months (around 05/17/2017) for re-evaluation.   [name], PA-C [facility] at [facility]

## 2017-02-15 NOTE — Progress Notes (Signed)
THIS NOTE IS USED FOR EDUCATIONAL PURPOSES ONLY!!!   Name: [name]  DOB: 12/28/1970  Age: 46 y.o. Sex: female  CC:  Chief Complaint  Patient presents with  . Medication Refill    Alprazolam 2 mg, Adderall 20 mg, Oxycodone 60 mg    PCP: Porfirio Oar, PA-C  HPI: Patient reports today for a refill of her medications. She reports that she has no issues with the medications and her symptoms are well controlled.   Patient reports she is continuing to have symptoms from her GERD and she has been called about a referral to GI but has not had the time to go over and have a visit.   Patient reports she has been having uncontrolled seasonal allergy symptoms. She reports congestion, rhinorrhea, and itchy watery eyes.   Patient reports that she is able to cut down on her opoid abuse  And is only using 3-4 times weekly. She reports that she feels like when she is up moving her symptoms are worse.    ROS:  Constitutional: Negative for activity change, appetite change, fatigue and unexpected weight change.  HENT: Negative for congestion, dental problem, ear pain, hearing loss, mouth sores, postnasal drip, rhinorrhea, sneezing, sore throat, tinnitus and trouble swallowing.   Eyes: Negative for photophobia, pain, redness and visual disturbance.  Respiratory: Negative for cough, chest tightness and shortness of breath.   Cardiovascular: Negative for chest pain, palpitations and leg swelling.  Gastrointestinal: Negative for abdominal pain, blood in stool, constipation, diarrhea, nausea and vomiting.  Genitourinary: Negative for dysuria, frequency, hematuria and urgency.  Musculoskeletal: Negative for arthralgias, gait problem, myalgias and neck stiffness.  Skin: Negative for rash.  Neurological: Negative for dizziness, speech difficulty, weakness, light-headedness, numbness and headaches.  Hematological: Negative for adenopathy.  Psychiatric/Behavioral: Negative for confusion and sleep disturbance.  The patient is not nervous/anxious.    PMH:  Patient Active Problem List   Diagnosis Date Noted  . 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 IBS (irritable bowel syndrome) 06/03/2014  . Anxiety 03/09/2014  . Obesity (BMI 30-39.9) 10/27/2013  . Neck pain on left side 09/02/2013  . Cervical radiculopathy 08/18/2013  . Chronic pain disorder 08/16/2013  . Drug-seeking behavior 08/16/2013  . Lumbar back pain 11/21/2012  . Esophageal stricture 11/21/2012  . ADD (attention deficit disorder) 11/21/2012  . Hypertriglyceridemia 11/21/2012  . Carpal tunnel syndrome, bilateral 07/05/2012  . OSA (obstructive sleep apnea) 06/28/2012  . Constipation 01/01/2012  . Migraine headache 03/02/2011  . Depression 03/02/2011  . History of substance abuse 03/02/2011  . Hypertension 03/02/2011    Allergies:  Allergies  Allergen Reactions  . Imitrex [Sumatriptan Base] Shortness Of Breath and Other (See Comments)    TACHYCARDIA  . Trazodone And Nefazodone Other (See Comments)    NIGHTMARES  . Wellbutrin [Bupropion] Other (See Comments)    "crawling out of my skin"  . Effexor [Venlafaxine] Other (See Comments)    MAKES HER "JITTERY"  . Metoclopramide Hcl Itching  . Morphine And Related Itching  . Tegretol [Carbamazepine] Itching  . Topamax Itching    Medications:  Current Outpatient Prescriptions on File Prior to Visit  Medication Sig Dispense Refill  . acetaminophen (TYLENOL) 500 MG tablet Take 1,000 mg by mouth every 6 (six) hours as needed for moderate pain.    Marland Kitchen alprazolam (XANAX) 2 MG tablet Take 0.5-1 tablets (1-2 mg total) by mouth 2 (two) times daily as needed for sleep. 60 tablet 0  . alprazolam (XANAX) 2 MG tablet Take 0.5-1  tablets (1-2 mg total) by mouth 2 (two) times daily as needed for sleep. 60 tablet 0  . alprazolam (XANAX) 2 MG tablet Take 0.5-1 tablets (1-2 mg total) by mouth 2 (two) times daily as needed for sleep. 60 tablet 0  . amLODipine (NORVASC) 10 MG tablet TAKE 1 TABLET BY MOUTH AT  BEDTIME. (NEED APPOINTMENT FOR REFILLS) 90 tablet 0  . amphetamine-dextroamphetamine (ADDERALL) 20 MG tablet Take 1 tablet (20 mg total) by mouth 3 (three) times daily. 90 tablet 0  . amphetamine-dextroamphetamine (ADDERALL) 20 MG tablet Take 1 tablet (20 mg total) by mouth 3 (three) times daily. 90 tablet 0  . amphetamine-dextroamphetamine (ADDERALL) 20 MG tablet Take 1 tablet (20 mg total) by mouth 3 (three) times daily. 90 tablet 0  . ARIPiprazole (ABILIFY) 10 MG tablet Take 1 tablet (10 mg total) by mouth daily. 90 tablet 1  . atorvastatin (LIPITOR) 20 MG tablet Take 1 tablet (20 mg total) by mouth daily. 90 tablet 3  . ciprofloxacin-hydrocortisone (CIPRO HC) otic suspension Place 3 drops into the right ear 2 (two) times daily. 10 mL 0  . oxyCODONE (OXYCONTIN) 60 MG 12 hr tablet Take 120 mg by mouth every 12 (twelve) hours. 120 each 0  . oxyCODONE (OXYCONTIN) 60 MG 12 hr tablet Take 120 mg by mouth every 12 (twelve) hours. 120 each 0  . oxyCODONE (OXYCONTIN) 60 MG 12 hr tablet Take 120 mg by mouth every 12 (twelve) hours. 120 each 0  . pantoprazole (PROTONIX) 40 MG tablet Take 1 tablet (40 mg total) by mouth 2 (two) times daily. 180 tablet 3  . sertraline (ZOLOFT) 100 MG tablet TAKE 1 TABLET BY MOUTH EVERY DAY 90 tablet 3  . valACYclovir (VALTREX) 1000 MG tablet Take 1 tablet (1,000 mg total) by mouth daily. 90 tablet 3  . EPINEPHrine (EPIPEN) 0.3 mg/0.3 mL IJ SOAJ injection Inject 0.3 mLs (0.3 mg total) into the muscle once as needed (Anaphylaxis). 1 Device 2  . EPINEPHrine 0.3 mg/0.3 mL IJ SOAJ injection Inject into the muscle.    . ibuprofen (ADVIL,MOTRIN) 200 MG tablet Take 600 mg by mouth 2 (two) times daily as needed for pain.    Marland Kitchen lubiprostone (AMITIZA) 24 MCG capsule Take 1 capsule (24 mcg total) by mouth 2 (two) times daily with a meal. (Patient not taking: Reported on 11/07/2016) 180 capsule 3  . ondansetron (ZOFRAN-ODT) 8 MG disintegrating tablet DISSOLVE 1 TABLET BY MOUTH EVERY 8 HOURS  AS NEEDED FOR NAUSEA (Patient not taking: Reported on 02/15/2017) 90 tablet 0   No current facility-administered medications on file prior to visit.     PE:   GS: WDWN female sitting on exam table in NAD.  Vitals: BP 126/85   Pulse 98   Temp 98.2 F (36.8 C) (Oral)   Resp 16   Ht  (1.651 m)   Wt 203 lb 3.2 oz (92.2 kg)   SpO2 100%   BMI 33.81 kg/m  HEENT: Normocephalic, atruamatic. PEARRL. No cervical lymphadenopathy. No thyroid nodules, normal size, and equal bilaterally. Ears: Bilateral ears without erythema, TM clear with good coen of light. TM without bulging. Nose: Patent, without erythema or discharge. Mouth/Throat: Moist mucous membranes. No erythema. Tonsils without erythema or tonsillar exudate.  Cardiovascular: RRR. No S3 or S4. No murmurs, rubs, or gallops. Pulses 2+ and equal bilateral in the upper and lower extremities. No pitting edema. No varicosities, clubbing, or cyanosis.  Pulm: CTA bilaterally. No expiratory muscle use while breathing.  GI: +BS.  NTND. No rigidity or guarding. No rebound tenderness.  Neuro: CN 2-12 grossly intact.  Psych: A&O x 4. Mood and affect appropriate for situation.  Skin: Warm and dry. No rashes or excoriations on exposed skin.   A&P:  1. Chronic pain disorder - controlled. Plan: oxyCODONE (OXYCONTIN) 60 MG 12 hr tablet, oxyCODONE (OXYCONTIN) 60 MG 12 hr tablet, oxyCODONE (OXYCONTIN) 60 MG 12 hr tablet, ToxASSURE Select 13 (MW), Urine  2. Chronic midline low back pain without sciatica - controled on medications. Only using 3-4 times weekly. She's trying to go to the gym more and hoping that will decrease her back pain.  Plan: oxyCODONE (OXYCONTIN) 60 MG 12 hr tablet, oxyCODONE (OXYCONTIN) 60 MG 12 hr tablet, oxyCODONE (OXYCONTIN) 60 MG 12 hr tablet  3. Attention deficit disorder (ADD) without hyperactivity - controlled on current medication. Plan: amphetamine-dextroamphetamine (ADDERALL) 20 MG tablet, amphetamine-dextroamphetamine  (ADDERALL) 20 MG tablet, amphetamine-dextroamphetamine (ADDERALL) 20 MG tablet  4. Anxiety - controlled on current medication  Plan: alprazolam (XANAX) 2 MG tablet, alprazolam (XANAX) 2 MG tablet, alprazolam (XANAX) 2 MG tablet  Seasonal allergic rhinitis, unspecified chronicity, unspecified trigger - Plan: montelukast (SINGULAIR) 10 MG tablet       Respectfully,  Camillia Herter, PA-S2

## 2017-02-15 NOTE — Patient Instructions (Signed)
     IF you received an x-ray today, you will receive an invoice from [facility]. Please contact [facility] at [phone] with questions or concerns regarding your invoice.   IF you received labwork today, you will receive an invoice from [facility]. Please contact [facility] at [phone] with questions or concerns regarding your invoice.   Our billing staff will not be able to assist you with questions regarding bills from these companies.  You will be contacted with the lab results as soon as they are available. The fastest way to get your results is to activate your My Chart account. Instructions are located on the last page of this paperwork. If you have not heard from us regarding the results in 2 weeks, please contact this office.     

## 2017-02-15 NOTE — Assessment & Plan Note (Addendum)
Well-controlled on current regimen. ?

## 2017-02-15 NOTE — Assessment & Plan Note (Signed)
Update UDS. Continue current treatment. She is very aware of the risks associated with chronic opiate use, more so with simultaneous benzodiazepine use.

## 2017-02-15 NOTE — Assessment & Plan Note (Signed)
Well controlled on her current regimen. Higher risk treatment due to chronic opiate use.

## 2017-02-20 LAB — TOXASSURE SELECT 13 (MW), URINE

## 2017-02-25 ENCOUNTER — Other Ambulatory Visit: Payer: Self-pay | Admitting: Physician Assistant

## 2017-02-25 DIAGNOSIS — F419 Anxiety disorder, unspecified: Secondary | ICD-10-CM

## 2017-02-25 DIAGNOSIS — F329 Major depressive disorder, single episode, unspecified: Secondary | ICD-10-CM

## 2017-02-25 DIAGNOSIS — F32A Depression, unspecified: Secondary | ICD-10-CM

## 2017-02-26 ENCOUNTER — Encounter: Payer: Self-pay | Admitting: Physician Assistant

## 2017-02-26 ENCOUNTER — Telehealth: Payer: Self-pay | Admitting: Physician Assistant

## 2017-02-26 NOTE — Telephone Encounter (Signed)
You are aware?

## 2017-02-26 NOTE — Telephone Encounter (Signed)
Left message on pharmacy VM regarding the interaction between alprazolam and oxycontin.  I am very aware of the risks associated with these medications and continue to discuss dose reductions/D/C with the patient.

## 2017-02-26 NOTE — Telephone Encounter (Signed)
[facility] is calling about a medication interaction with oxycontin and generic zanax and pt has been on for a while an he pharmacy has to document this now   Best number [phone]

## 2017-03-07 ENCOUNTER — Encounter: Payer: Self-pay | Admitting: Physician Assistant

## 2017-03-13 ENCOUNTER — Ambulatory Visit: Payer: Commercial Managed Care - PPO | Admitting: Physician Assistant

## 2017-03-27 ENCOUNTER — Encounter: Payer: Self-pay | Admitting: Physician Assistant

## 2017-03-27 MED ORDER — AMLODIPINE BESYLATE 10 MG PO TABS: ORAL_TABLET | ORAL | 3 refills | Status: DC

## 2017-05-24 ENCOUNTER — Telehealth: Payer: Self-pay | Admitting: Physician Assistant

## 2017-05-24 DIAGNOSIS — G894 Chronic pain syndrome: Secondary | ICD-10-CM

## 2017-05-24 DIAGNOSIS — F419 Anxiety disorder, unspecified: Secondary | ICD-10-CM

## 2017-05-24 DIAGNOSIS — F988 Other specified behavioral and emotional disorders with onset usually occurring in childhood and adolescence: Secondary | ICD-10-CM

## 2017-05-24 MED ORDER — AMPHETAMINE-DEXTROAMPHETAMINE 20 MG PO TABS: 20.0000 mg | ORAL_TABLET | Freq: Three times a day (TID) | ORAL | 0 refills | Status: DC

## 2017-05-24 MED ORDER — OXYCODONE HCL ER 60 MG PO T12A: 120.0000 mg | EXTENDED_RELEASE_TABLET | Freq: Two times a day (BID) | ORAL | 0 refills | Status: DC

## 2017-05-24 MED ORDER — ALPRAZOLAM 2 MG PO TABS
1.0000 mg | ORAL_TABLET | Freq: Two times a day (BID) | ORAL | 0 refills | Status: DC | PRN
Start: 2017-05-24 — End: 2017-06-21

## 2017-05-24 NOTE — Telephone Encounter (Signed)
Wife here for visit. Patient's rx's will be due to fill next while I am out of the office.  Rxs printed to fill during my time out of the office, and patient will schedule visit for the next Rx.  Meds ordered this encounter  Medications  . alprazolam (XANAX) 2 MG tablet    Sig: Take 0.5-1 tablets (1-2 mg total) by mouth 2 (two) times daily as needed for sleep.    Dispense:  60 tablet    Refill:  0    Order Specific Question:   Supervising Provider    Answer:   SHAW, EVA N [[id number]]  . oxyCODONE (OXYCONTIN) 60 MG 12 hr tablet    Sig: Take 120 mg by mouth every 12 (twelve) hours.    Dispense:  120 each    Refill:  0    Order Specific Question:   Supervising Provider    Answer:   Clelia CroftSHAW, EVA N [[id number]]  . amphetamine-dextroamphetamine (ADDERALL) 20 MG tablet    Sig: Take 1 tablet (20 mg total) by mouth 3 (three) times daily.    Dispense:  90 tablet    Refill:  0    Order Specific Question:   Supervising Provider    Answer:   Clelia CroftSHAW, EVA N [[id number]]

## 2017-06-04 ENCOUNTER — Other Ambulatory Visit: Payer: Self-pay | Admitting: Physician Assistant

## 2017-06-04 DIAGNOSIS — F329 Major depressive disorder, single episode, unspecified: Secondary | ICD-10-CM

## 2017-06-04 DIAGNOSIS — F32A Depression, unspecified: Secondary | ICD-10-CM

## 2017-06-04 DIAGNOSIS — F419 Anxiety disorder, unspecified: Secondary | ICD-10-CM

## 2017-06-18 ENCOUNTER — Other Ambulatory Visit: Payer: Self-pay | Admitting: Physician Assistant

## 2017-06-18 NOTE — Telephone Encounter (Signed)
mychart message sent to pt about making an apt for blood work

## 2017-06-19 ENCOUNTER — Encounter (HOSPITAL_BASED_OUTPATIENT_CLINIC_OR_DEPARTMENT_OTHER): Payer: Self-pay | Admitting: *Deleted

## 2017-06-19 ENCOUNTER — Emergency Department (HOSPITAL_BASED_OUTPATIENT_CLINIC_OR_DEPARTMENT_OTHER)
Admission: EM | Admit: 2017-06-19 | Discharge: 2017-06-19 | Disposition: A | Discharge status: Home / Self Care | Payer: Commercial Managed Care - PPO | Attending: Emergency Medicine | Admitting: Emergency Medicine

## 2017-06-19 DIAGNOSIS — R079 Chest pain, unspecified: Secondary | ICD-10-CM | POA: Diagnosis present

## 2017-06-19 DIAGNOSIS — Z5321 Procedure and treatment not carried out due to patient leaving prior to being seen by health care provider: Secondary | ICD-10-CM | POA: Diagnosis not present

## 2017-06-19 NOTE — ED Triage Notes (Signed)
Chest pain since last night. Last night it felt like a ton of bricks on her chest. The pain comes and goes. Tonight it has been worse.

## 2017-06-19 NOTE — ED Triage Notes (Signed)
Registration called, states pt has left.

## 2017-06-21 ENCOUNTER — Ambulatory Visit (INDEPENDENT_AMBULATORY_CARE_PROVIDER_SITE_OTHER): Payer: Commercial Managed Care - PPO | Admitting: Physician Assistant

## 2017-06-21 ENCOUNTER — Encounter: Payer: Self-pay | Admitting: Physician Assistant

## 2017-06-21 VITALS — BP 121/79 | HR 98 | Resp 16 | Ht 66.14 in | Wt 205.0 lb

## 2017-06-21 DIAGNOSIS — Z79899 Other long term (current) drug therapy: Secondary | ICD-10-CM | POA: Diagnosis not present

## 2017-06-21 DIAGNOSIS — G894 Chronic pain syndrome: Secondary | ICD-10-CM | POA: Diagnosis not present

## 2017-06-21 DIAGNOSIS — F988 Other specified behavioral and emotional disorders with onset usually occurring in childhood and adolescence: Secondary | ICD-10-CM | POA: Diagnosis not present

## 2017-06-21 DIAGNOSIS — F419 Anxiety disorder, unspecified: Secondary | ICD-10-CM | POA: Diagnosis not present

## 2017-06-21 MED ORDER — OXYCODONE HCL ER 60 MG PO T12A: 120.0000 mg | EXTENDED_RELEASE_TABLET | Freq: Two times a day (BID) | ORAL | 0 refills | Status: DC

## 2017-06-21 MED ORDER — AMPHETAMINE-DEXTROAMPHETAMINE 20 MG PO TABS: 20.0000 mg | ORAL_TABLET | Freq: Three times a day (TID) | ORAL | 0 refills | Status: DC

## 2017-06-21 MED ORDER — ALPRAZOLAM 2 MG PO TABS: 1.0000 mg | ORAL_TABLET | Freq: Two times a day (BID) | ORAL | 0 refills | Status: DC | PRN

## 2017-06-21 NOTE — Progress Notes (Signed)
[name]  MRN: [id number] DOB: 06/21/1971  PCP: Porfirio Oar, PA-C  Chief Complaint  Patient presents with  . Hospitalization Follow-up    pt was seen in the ER 8/14 for chest pain  and just would like to follow-up    Subjective:  Pt presents to clinic for drug screen and because her daughter is worried about her.  2 nights ago she had a 10 min period of time while on the phone with her daughter where she felt pressure "like an elephant on her chest"  When she thought it might be anxiety and she took her xanax the pressure quickly went away and has not returned since that episode.  She did go to the ED ad had an EKG which she was told was normal and she decided to leave without further evaluation.  No nausea, SOB or pain radiation.  No palpitation or paresthesias or other anxiety symptoms.  No pain since that 20 min episode.  She is really here today due to her daughter's request.  She also need her medication refilled and her every 3 months drug screen performed.  History is obtained by patient.  EKG reviewed from ED - NSR without acute changes.  Review of Systems  Respiratory: Negative for shortness of breath.   Cardiovascular: Negative for chest pain.  Psychiatric/Behavioral: The patient is not nervous/anxious.     Patient Active Problem List   Diagnosis Date Noted  . 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 IBS (irritable bowel syndrome) 06/03/2014  . Anxiety 03/09/2014  . Obesity (BMI 30-39.9) 10/27/2013  . Neck pain on left side 09/02/2013  . Cervical radiculopathy 08/18/2013  . Chronic pain disorder 08/16/2013  . Drug-seeking behavior 08/16/2013  . Lumbar back pain 11/21/2012  . Esophageal stricture 11/21/2012  . ADD (attention deficit disorder) 11/21/2012  . Hypertriglyceridemia 11/21/2012  . Carpal tunnel syndrome, bilateral 07/05/2012  . OSA (obstructive sleep apnea) 06/28/2012  . Constipation 01/01/2012  . Migraine headache 03/02/2011  . Depression 03/02/2011    . History of substance abuse 03/02/2011  . Hypertension 03/02/2011    Current Outpatient Prescriptions on File Prior to Visit  Medication Sig Dispense Refill  . acetaminophen (TYLENOL) 500 MG tablet Take 1,000 mg by mouth every 6 (six) hours as needed for moderate pain.    Marland Kitchen alprazolam (XANAX) 2 MG tablet Take 0.5-1 tablets (1-2 mg total) by mouth 2 (two) times daily as needed for sleep. 60 tablet 0  . alprazolam (XANAX) 2 MG tablet Take 0.5-1 tablets (1-2 mg total) by mouth 2 (two) times daily as needed for sleep. 60 tablet 0  . amLODipine (NORVASC) 10 MG tablet TAKE 1 TABLET BY MOUTH AT BEDTIME. 90 tablet 3  . amphetamine-dextroamphetamine (ADDERALL) 20 MG tablet Take 1 tablet (20 mg total) by mouth 3 (three) times daily. 90 tablet 0  . amphetamine-dextroamphetamine (ADDERALL) 20 MG tablet Take 1 tablet (20 mg total) by mouth 3 (three) times daily. 90 tablet 0  . ARIPiprazole (ABILIFY) 10 MG tablet TAKE 1 TABLET BY MOUTH EVERY DAY 90 tablet 0  . atorvastatin (LIPITOR) 20 MG tablet TAKE 1 TABLET BY MOUTH EVERY DAY 30 tablet 0  . ciprofloxacin-hydrocortisone (CIPRO HC) otic suspension Place 3 drops into the right ear 2 (two) times daily. 10 mL 0  . EPINEPHrine (EPIPEN) 0.3 mg/0.3 mL IJ SOAJ injection Inject 0.3 mLs (0.3 mg total) into the muscle once as needed (Anaphylaxis). 1 Device 2  . EPINEPHrine 0.3 mg/0.3 mL IJ SOAJ injection Inject into  the muscle.    . ibuprofen (ADVIL,MOTRIN) 200 MG tablet Take 600 mg by mouth 2 (two) times daily as needed for pain.    Marland Kitchen lubiprostone (AMITIZA) 24 MCG capsule Take 1 capsule (24 mcg total) by mouth 2 (two) times daily with a meal. 180 capsule 3  . montelukast (SINGULAIR) 10 MG tablet Take 1 tablet (10 mg total) by mouth at bedtime. 90 tablet 3  . ondansetron (ZOFRAN-ODT) 8 MG disintegrating tablet DISSOLVE 1 TABLET BY MOUTH EVERY 8 HOURS AS NEEDED FOR NAUSEA 90 tablet 0  . oxyCODONE (OXYCONTIN) 60 MG 12 hr tablet Take 120 mg by mouth every 12 (twelve)  hours. 120 each 0  . oxyCODONE (OXYCONTIN) 60 MG 12 hr tablet Take 120 mg by mouth every 12 (twelve) hours. 120 each 0  . pantoprazole (PROTONIX) 40 MG tablet Take 1 tablet (40 mg total) by mouth 2 (two) times daily. 180 tablet 3  . sertraline (ZOLOFT) 100 MG tablet TAKE 1 TABLET BY MOUTH EVERY DAY 90 tablet 3  . valACYclovir (VALTREX) 1000 MG tablet Take 1 tablet (1,000 mg total) by mouth daily. 90 tablet 3   No current facility-administered medications on file prior to visit.     Allergies  Allergen Reactions  . Imitrex [Sumatriptan Base] Shortness Of Breath and Other (See Comments)    TACHYCARDIA  . Trazodone And Nefazodone Other (See Comments)    NIGHTMARES  . Wellbutrin [Bupropion] Other (See Comments)    "crawling out of my skin"  . Effexor [Venlafaxine] Other (See Comments)    MAKES HER "JITTERY"  . Metoclopramide Hcl Itching  . Morphine And Related Itching  . Tegretol [Carbamazepine] Itching  . Topamax Itching    Past Medical History:  Diagnosis Date  . Anxiety   . Arthritis    left knee  . Cervical muscle strain 07/2013   current Prednisone taper  . Complication of anesthesia 03/2011   history of aspiration after anesthesia  . Dental crowns present    x 2 upper front  . Depression   . Genital herpes simplex type 2   . GERD (gastroesophageal reflux disease)   . Herniated disc, cervical   . History of alcohol abuse    no alcohol since 2004  . History of cocaine abuse    none since 2011, entered treatment  . History of esophageal dilatation    multiple  . History of kidney stones   . History of pancreatitis 2012  . Hypertension    under control with med., has been on med. x 3 yr.  . Medial meniscus tear 07/2013   right  . Migraine headache   . Sleep apnea    not wearing c-pap  . Substance abuse    Social History   Social History Narrative   3 caffeine drinks daily    Lives with her wife and one daughter.   Her other child lives with a relative nearby.        Was recruited to [employer], with a full scholarship, for undergraduate studies, but had to decline due to family problems.  She then planned to attend [employer] ([employer]) but wasn't able to work and attend courses, so went to [employer] and studied Production designer, theatre/television/film.      Has dropped out of the substance abuse counseling program she was enrolled in, having figured out that it's not a career path she wants to continue.  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  Social History  Substance Use Topics  . Smoking status: Former Smoker    Quit date: 06/05/2013  . Smokeless tobacco: Never Used  . Alcohol use No     Comment: none since 2004   family history includes Alcohol abuse in her maternal uncle and mother; Cancer in her mother; Heart disease in her maternal grandmother and maternal uncle; Hypertension in her mother; Kidney cancer in her mother; Lung cancer in her maternal aunt; Stroke (age of onset: 20) in her maternal uncle.     Objective:  BP 121/79   Pulse 98   Resp 16   Ht 5' 6.14" (1.68 m)   Wt 205 lb (93 kg)   SpO2 98%   BMI 32.95 kg/m  Body mass index is 32.95 kg/m.  Physical Exam  Constitutional: She is oriented to person, place, and time and well-developed, well-nourished, and in no distress.  HENT:  Head: Normocephalic and atraumatic.  Right Ear: Hearing and external ear normal.  Left Ear: Hearing and external ear normal.  Eyes: Conjunctivae are normal.  Neck: Normal range of motion.  Cardiovascular: Normal rate, regular rhythm and normal heart sounds.   No murmur heard. Pulmonary/Chest: Effort normal and breath sounds normal. She has no wheezes.  Neurological: She is alert and oriented to person, place, and time. Gait normal.  Skin: Skin is warm and dry.  Psychiatric: Mood, memory, affect and judgment normal.  Vitals reviewed.   Assessment and Plan :  Medication management - Plan: ToxASSURE Select 13 (MW), Urine  Chronic pain disorder  - Plan: oxyCODONE (OXYCONTIN) 60 MG 12 hr tablet, ToxASSURE Select 13 (MW), Urine  Anxiety - Plan: alprazolam (XANAX) 2 MG tablet, ToxASSURE Select 13 (MW), Urine  Attention deficit disorder (ADD) without hyperactivity - Plan: amphetamine-dextroamphetamine (ADDERALL) 20 MG tablet, ToxASSURE Select 13 (MW), Urine   Pt presents for medication refill and drug screen which we have done today.  She had an episode of chest pain that was controlled with her anxiety medications and has not returned with a normal EKG - she is comfortable without furhter workup but knows to seek medical attention if it would occur again.  Benny Lennert PA-C  Primary Care at [facility] 06/22/2017 11:14 AM

## 2017-06-21 NOTE — Patient Instructions (Signed)
     IF you received an x-ray today, you will receive an invoice from [facility]. Please contact [facility] at [phone] with questions or concerns regarding your invoice.   IF you received labwork today, you will receive an invoice from [facility]. Please contact [facility] at [phone] with questions or concerns regarding your invoice.   Our billing staff will not be able to assist you with questions regarding bills from these companies.  You will be contacted with the lab results as soon as they are available. The fastest way to get your results is to activate your My Chart account. Instructions are located on the last page of this paperwork. If you have not heard from us regarding the results in 2 weeks, please contact this office.     

## 2017-06-26 LAB — TOXASSURE SELECT 13 (MW), URINE

## 2017-07-23 ENCOUNTER — Telehealth: Payer: Self-pay | Admitting: Family Medicine

## 2017-07-24 NOTE — Telephone Encounter (Signed)
Renay can you please start priorauth for this please.

## 2017-07-25 ENCOUNTER — Encounter: Payer: Self-pay | Admitting: Physician Assistant

## 2017-07-25 NOTE — Telephone Encounter (Signed)
Call to insurance company and representative told me that the claim was being held for quantity.  She said she would try to get the pharmacist to over-ride the denial so that the claim could be processed today. They were not able to get an over-ride due to cost of medication, however, they said they should have answer within 48 hours due to expediting the request. I inquired as to how many could be approved for usage during the 48 hr period.  She contacted her supervisor, and he said that they could not approve any until the actual PA of the entire RX was completed.  Patient notified.

## 2017-07-25 NOTE — Telephone Encounter (Signed)
Spoke to patient and advised her that I have not received anything from the pharmacy, but I would call them when they open and try to get this straightened out for her.  Verified pharmacy and told her I will call her back when I know something.  She was very Adult nurse.

## 2017-07-25 NOTE — Telephone Encounter (Signed)
Called pharmacy to inquire about why medication was being denied after so long.  Pharmacist advised me that it is because the patient has changed insurance companies and the quantity is what is being denied.  She gave me information for new insurance.  I started a PA with them.  They stated that it has to go to a pharmacist for clinical review to be approved. (PA# is [id number]) This could take up to 15 days.  I advised patient of this, and she inquired "what am I supposed to do for 15 days.)  I told her that I would send a message to provider to be advised, and I would call her back this afternoon.  She was agreeable with this.

## 2017-07-25 NOTE — Telephone Encounter (Signed)
What is the quantity limit? Perhaps I could prescribe that quantity while we are waiting for the prior authorization?

## 2017-07-26 NOTE — Telephone Encounter (Signed)
Patient's wife called and stated that she had called the insurance company today and they "gave her the run around" She stated that they were not very helpful at all, and she had "let them have ir."   She wanted me to call again. I assured her that I would and told her that I was very sorry she had had such a bad experience.   I called the insurance company a total of 3 times and the first two times I called the automated system put me on hold due to heavy call volumes and I held for over 20 minutes each time, eventually hanging up. I called [name] back and told her that I had tried but I could not get through.  She encouraged me to call once more because patient only has one day of medication left.  I told her that I would do  I called again and this time got through eventually.  The representative I spoke with told me that this case should not have been expedited to begin with and cases can take up to 15 days for approval.  I told her that was unacceptable as the patient has already been waiting several days.  She stated that whomever I spoke to yesterday did not follow procedure by escalating the need for the patient's medication.  She put me on hold for 27 minutes while "talking to her supervisor and reviewing the case. When she eventually returned to the phone she said that the supervisor had basically undone everything that I had accomplished yesterday with getting the case escalated.  I asked her why they would have done that, and she again stated that it was following procedure.  I told her that the patient would be suffering without her medication and could possibly go into withdrawal from the medication and wasn't there anything more she could do to help me.  She said she had done all she could do. I am turning this over to Irene Shipper, [employer] for him to follow up with patient or her wife.

## 2017-07-30 ENCOUNTER — Telehealth: Payer: Self-pay

## 2017-07-30 ENCOUNTER — Encounter: Payer: Self-pay | Admitting: Physician Assistant

## 2017-07-30 NOTE — Telephone Encounter (Signed)
Thank you for all of the work you did for this patient.

## 2017-07-30 NOTE — Telephone Encounter (Signed)
Patient's wife [name] came into the office to discuss what has been happening with patient's claim.  I explained what I had doing for the patient to get approval.  A fax came and stated the medication had been denied due to cost.  I called [facility] back and asked them to explain, and they could not.  They told me that an over-ride was needed through the pharmacy.  They gave me a number [phone] for the pharmacy help desk.  I called [facility] and gave the pharmacist the number.  He called them and I held the line.  He was able to get approval for patient's medication.  Wife was very happy with the outcome.

## 2017-08-01 ENCOUNTER — Other Ambulatory Visit: Payer: Self-pay | Admitting: Physician Assistant

## 2017-08-02 ENCOUNTER — Telehealth: Payer: Self-pay

## 2017-08-02 NOTE — Telephone Encounter (Signed)
Filled out request through cover my meds today.  Key code is [id number].  Check back in 72 hrs for results.

## 2017-08-06 NOTE — Telephone Encounter (Signed)
Received approval on atorvastatin today.  Pharmacy will contact the patient.

## 2017-08-13 ENCOUNTER — Encounter: Payer: Self-pay | Admitting: Physician Assistant

## 2017-08-13 ENCOUNTER — Telehealth: Payer: Self-pay | Admitting: Physician Assistant

## 2017-08-13 DIAGNOSIS — F988 Other specified behavioral and emotional disorders with onset usually occurring in childhood and adolescence: Secondary | ICD-10-CM

## 2017-08-13 DIAGNOSIS — G894 Chronic pain syndrome: Secondary | ICD-10-CM

## 2017-08-13 DIAGNOSIS — F419 Anxiety disorder, unspecified: Secondary | ICD-10-CM

## 2017-08-13 NOTE — Telephone Encounter (Signed)
Patient called in stating she needs her Xanax , her Adderall  and her Oxycodone  refilled by [name]. And she states she came in and was seen by [name] while Avelino Leeds was on vacation and not at the office. So she doesn't think she needs to have an OV  Her call back number is [phone]

## 2017-08-14 ENCOUNTER — Telehealth: Payer: Self-pay

## 2017-08-14 MED ORDER — ALPRAZOLAM 2 MG PO TABS: 1.0000 mg | ORAL_TABLET | Freq: Two times a day (BID) | ORAL | 0 refills | Status: DC | PRN

## 2017-08-14 MED ORDER — OXYCODONE HCL ER 60 MG PO T12A: 120.0000 mg | EXTENDED_RELEASE_TABLET | Freq: Two times a day (BID) | ORAL | 0 refills | Status: DC

## 2017-08-14 MED ORDER — AMPHETAMINE-DEXTROAMPHETAMINE 20 MG PO TABS: 20.0000 mg | ORAL_TABLET | Freq: Three times a day (TID) | ORAL | 0 refills | Status: DC

## 2017-08-14 NOTE — Telephone Encounter (Signed)
Pt notifed via MyChart and VM was full

## 2017-08-14 NOTE — Telephone Encounter (Signed)
Attempted to call pt to notify her that Rx were ready for pick-up but VM is full. Pt did receive a MyChart message from [facility] as well.

## 2017-08-14 NOTE — Telephone Encounter (Signed)
Patient notified via My Chart.  Meds ordered this encounter  Medications  . alprazolam (XANAX) 2 MG tablet    Sig: Take 0.5-1 tablets (1-2 mg total) by mouth 2 (two) times daily as needed for sleep.    Dispense:  60 tablet    Refill:  0    May fill 30 days after date on prescription    Order Specific Question:   Supervising Provider    Answer:   Clelia Croft, EVA N [[id number]]  . alprazolam (XANAX) 2 MG tablet    Sig: Take 0.5-1 tablets (1-2 mg total) by mouth 2 (two) times daily as needed for sleep.    Dispense:  60 tablet    Refill:  0    May fill 60 days after date on prescription    Order Specific Question:   Supervising Provider    Answer:   Clelia Croft, EVA N [[id number]]  . alprazolam (XANAX) 2 MG tablet    Sig: Take 0.5-1 tablets (1-2 mg total) by mouth 2 (two) times daily as needed for sleep.    Dispense:  60 tablet    Refill:  0    Order Specific Question:   Supervising Provider    Answer:   SHAW, EVA N [[id number]]  . oxyCODONE (OXYCONTIN) 60 MG 12 hr tablet    Sig: Take 120 mg by mouth every 12 (twelve) hours.    Dispense:  120 each    Refill:  0    May fill 30 days after date on prescription    Order Specific Question:   Supervising Provider    Answer:   Clelia Croft, EVA N [[id number]]  . oxyCODONE (OXYCONTIN) 60 MG 12 hr tablet    Sig: Take 120 mg by mouth every 12 (twelve) hours.    Dispense:  120 each    Refill:  0    Order Specific Question:   Supervising Provider    Answer:   Clelia Croft, EVA N [[id number]]  . oxyCODONE (OXYCONTIN) 60 MG 12 hr tablet    Sig: Take 120 mg by mouth every 12 (twelve) hours.    Dispense:  120 each    Refill:  0    May fill 60 days after date on prescription    Order Specific Question:   Supervising Provider    Answer:   Clelia Croft, EVA N [[id number]]  . amphetamine-dextroamphetamine (ADDERALL) 20 MG tablet    Sig: Take 1 tablet (20 mg total) by mouth 3 (three) times daily.    Dispense:  90 tablet    Refill:  0    May fill 30 days after date on prescription    Order Specific Question:    Supervising Provider    Answer:   Clelia Croft, EVA N [[id number]]  . amphetamine-dextroamphetamine (ADDERALL) 20 MG tablet    Sig: Take 1 tablet (20 mg total) by mouth 3 (three) times daily.    Dispense:  90 tablet    Refill:  0    May fill 60 days after date on prescription    Order Specific Question:   Supervising Provider    Answer:   Clelia Croft, EVA N [[id number]]  . amphetamine-dextroamphetamine (ADDERALL) 20 MG tablet    Sig: Take 1 tablet (20 mg total) by mouth 3 (three) times daily.    Dispense:  90 tablet    Refill:  0    Order Specific Question:   Supervising Provider    Answer:   Clelia Croft, EVA N [[id number]]

## 2017-08-31 ENCOUNTER — Telehealth: Payer: Self-pay

## 2017-09-02 ENCOUNTER — Encounter: Payer: Self-pay | Admitting: Physician Assistant

## 2017-09-04 NOTE — Telephone Encounter (Signed)
Patient has chronic low back pain and neck pain with cervical radiculopathy.  Contacted the pharmacy, and was advised that the prescription had been filled, and there was no notation in the record there of any issue.

## 2017-09-24 ENCOUNTER — Encounter: Payer: Self-pay | Admitting: Physician Assistant

## 2017-09-24 NOTE — Telephone Encounter (Signed)
Copied from CRM [id number]. Topic: Inquiry >> Sep 24, 2017  3:43 PM Alexander BergeronBarksdale, Harvey B wrote: Reason for CRM: pt called to let practice know that her Rx for her tooth ache (antibiotic) [facility], pt is unaware of the name of the medication

## 2017-09-24 NOTE — Telephone Encounter (Signed)
Waiting to hear back from patient regarding pharmacy preference.

## 2017-09-25 ENCOUNTER — Other Ambulatory Visit: Payer: Self-pay

## 2017-09-25 MED ORDER — AMOXICILLIN 875 MG PO TABS: 875.0000 mg | ORAL_TABLET | Freq: Two times a day (BID) | ORAL | 0 refills | Status: AC

## 2017-09-25 MED ORDER — AMOXICILLIN 875 MG PO TABS: 875.0000 mg | ORAL_TABLET | Freq: Two times a day (BID) | ORAL | 0 refills | Status: DC

## 2017-11-02 ENCOUNTER — Telehealth: Payer: Self-pay

## 2017-11-02 NOTE — Telephone Encounter (Signed)
PA was started today for patient's oxycontin er tablets.  We should receive a fax when it is approved or denied.  It has been sent in for pharmacist review by [facility].

## 2017-11-05 ENCOUNTER — Other Ambulatory Visit: Payer: Self-pay | Admitting: Physician Assistant

## 2017-11-05 DIAGNOSIS — F32A Depression, unspecified: Secondary | ICD-10-CM

## 2017-11-05 DIAGNOSIS — F329 Major depressive disorder, single episode, unspecified: Secondary | ICD-10-CM

## 2017-11-05 DIAGNOSIS — F419 Anxiety disorder, unspecified: Secondary | ICD-10-CM

## 2017-11-05 MED ORDER — ONDANSETRON 8 MG PO TBDP: ORAL_TABLET | ORAL | 0 refills | Status: DC

## 2017-11-05 NOTE — Telephone Encounter (Signed)
Please advise 

## 2017-11-05 NOTE — Telephone Encounter (Signed)
Meds ordered this encounter  Medications  . ARIPiprazole (ABILIFY) 10 MG tablet    Sig: TAKE 1 TABLET BY MOUTH EVERY DAY    Dispense:  90 tablet    Refill:  0    Patient notified via My Chart. Needs OV.

## 2017-11-07 NOTE — Telephone Encounter (Signed)
Received approval on patient's oxycontin 60 mg ER tabs through 11/02/2018.  Patient aware.

## 2017-11-08 ENCOUNTER — Encounter: Payer: Self-pay | Admitting: Physician Assistant

## 2017-11-08 DIAGNOSIS — F988 Other specified behavioral and emotional disorders with onset usually occurring in childhood and adolescence: Secondary | ICD-10-CM

## 2017-11-08 DIAGNOSIS — F419 Anxiety disorder, unspecified: Secondary | ICD-10-CM

## 2017-11-09 ENCOUNTER — Encounter: Payer: Self-pay | Admitting: Physician Assistant

## 2017-11-11 MED ORDER — AMPHETAMINE-DEXTROAMPHETAMINE 20 MG PO TABS: 20.0000 mg | ORAL_TABLET | Freq: Three times a day (TID) | ORAL | 0 refills | Status: DC

## 2017-11-11 MED ORDER — ALPRAZOLAM 2 MG PO TABS: 1.0000 mg | ORAL_TABLET | Freq: Two times a day (BID) | ORAL | 0 refills | Status: DC | PRN

## 2017-11-11 MED ORDER — ALPRAZOLAM 2 MG PO TABS
1.0000 mg | ORAL_TABLET | Freq: Two times a day (BID) | ORAL | 0 refills | Status: DC | PRN
Start: 2017-11-11 — End: 2017-11-11

## 2017-11-11 MED ORDER — ALPRAZOLAM 2 MG PO TABS
1.0000 mg | ORAL_TABLET | Freq: Two times a day (BID) | ORAL | 0 refills | Status: DC | PRN
Start: 2017-11-11 — End: 2017-11-13

## 2017-11-11 NOTE — Telephone Encounter (Signed)
Patient notified via My Chart.  Rx sent electronically. Please SHRED the prescriptions that printed at 104.  Meds ordered this encounter  Medications  . amphetamine-dextroamphetamine (ADDERALL) 20 MG tablet    Sig: Take 1 tablet (20 mg total) by mouth 3 (three) times daily.    Dispense:  90 tablet    Refill:  0    Order Specific Question:   Supervising Provider    Answer:   SHAW, EVA N [[id number]]  . amphetamine-dextroamphetamine (ADDERALL) 20 MG tablet    Sig: Take 1 tablet (20 mg total) by mouth 3 (three) times daily.    Dispense:  90 tablet    Refill:  0    May fill 60 days after date on prescription    Order Specific Question:   Supervising Provider    Answer:   Clelia CroftSHAW, EVA N [[id number]]  . amphetamine-dextroamphetamine (ADDERALL) 20 MG tablet    Sig: Take 1 tablet (20 mg total) by mouth 3 (three) times daily.    Dispense:  90 tablet    Refill:  0    May fill 30 days after date on prescription    Order Specific Question:   Supervising Provider    Answer:   Clelia CroftSHAW, EVA N [[id number]]  . alprazolam (XANAX) 2 MG tablet    Sig: Take 0.5-1 tablets (1-2 mg total) by mouth 2 (two) times daily as needed for sleep.    Dispense:  60 tablet    Refill:  0    Order Specific Question:   Supervising Provider    Answer:   SHAW, EVA N [[id number]]  . alprazolam (XANAX) 2 MG tablet    Sig: Take 0.5-1 tablets (1-2 mg total) by mouth 2 (two) times daily as needed for sleep.    Dispense:  60 tablet    Refill:  0    May fill 60 days after date on prescription    Order Specific Question:   Supervising Provider    Answer:   Clelia CroftSHAW, EVA N [[id number]]  . alprazolam (XANAX) 2 MG tablet    Sig: Take 0.5-1 tablets (1-2 mg total) by mouth 2 (two) times daily as needed for sleep.    Dispense:  60 tablet    Refill:  0    May fill 30 days after date on prescription    Order Specific Question:   Supervising Provider    Answer:   Clelia CroftSHAW, EVA N [[id number]]

## 2017-11-13 ENCOUNTER — Other Ambulatory Visit: Payer: Self-pay

## 2017-11-13 ENCOUNTER — Ambulatory Visit: Payer: Managed Care, Other (non HMO) | Admitting: Physician Assistant

## 2017-11-13 ENCOUNTER — Encounter: Payer: Self-pay | Admitting: Physician Assistant

## 2017-11-13 VITALS — BP 102/78 | HR 115 | Temp 98.6°F | Resp 18 | Ht 66.14 in | Wt 203.0 lb

## 2017-11-13 DIAGNOSIS — F988 Other specified behavioral and emotional disorders with onset usually occurring in childhood and adolescence: Secondary | ICD-10-CM | POA: Diagnosis not present

## 2017-11-13 DIAGNOSIS — F419 Anxiety disorder, unspecified: Secondary | ICD-10-CM | POA: Diagnosis not present

## 2017-11-13 DIAGNOSIS — E781 Pure hyperglyceridemia: Secondary | ICD-10-CM | POA: Diagnosis not present

## 2017-11-13 DIAGNOSIS — F32A Depression, unspecified: Secondary | ICD-10-CM

## 2017-11-13 DIAGNOSIS — F329 Major depressive disorder, single episode, unspecified: Secondary | ICD-10-CM

## 2017-11-13 DIAGNOSIS — G894 Chronic pain syndrome: Secondary | ICD-10-CM

## 2017-11-13 DIAGNOSIS — I10 Essential (primary) hypertension: Secondary | ICD-10-CM

## 2017-11-13 MED ORDER — OXYCODONE HCL ER 60 MG PO T12A: EXTENDED_RELEASE_TABLET | ORAL | 0 refills | Status: DC

## 2017-11-13 MED ORDER — AMPHETAMINE-DEXTROAMPHETAMINE 20 MG PO TABS: 20.0000 mg | ORAL_TABLET | Freq: Three times a day (TID) | ORAL | 0 refills | Status: DC

## 2017-11-13 MED ORDER — ALPRAZOLAM 2 MG PO TABS: 1.0000 mg | ORAL_TABLET | Freq: Two times a day (BID) | ORAL | 0 refills | Status: DC | PRN

## 2017-11-13 MED ORDER — OXYCODONE HCL ER 60 MG PO T12A
EXTENDED_RELEASE_TABLET | ORAL | 0 refills | Status: DC
Start: 2017-11-13 — End: 2018-03-22

## 2017-11-13 NOTE — Patient Instructions (Addendum)
Being Mortal by Vivi FernsAtul Gawande, MD The Bright Hour by Wende MottNina Riggs    IF you received an x-ray today, you will receive an [facility] from [facility]. Please contact [name] at [phone] with questions or concerns regarding your invoice.   IF you received labwork today, you will receive an invoice from [facility]. Please contact [facility] at [phone] with questions or concerns regarding your invoice.   Our billing staff will not be able to assist you with questions regarding bills from these companies.  You will be contacted with the lab results as soon as they are available. The fastest way to get your results is to activate your My Chart account. Instructions are located on the last page of this paperwork. If you have not heard from us regarding the results in 2 weeks, please contact this office.

## 2017-11-13 NOTE — Progress Notes (Signed)
Patient ID: [name], female    DOB: August 31, 1971, 47 y.o.   MRN: [id number]  PCP: Porfirio Oar, PA-C  Chief Complaint  Patient presents with  . Medication Refill    Adderall 20 MG, Xanax 2 MG, Oxycodone HCI 60 MG    Subjective:   Presents for evaluation of chronic pain, ADD, depression, HTN, hypertriglyceridemia.  Overall, feels well. Continues to enjoy work. Her younger daughter got married. A cousin's recent cancer diagnosis is hitting her hard.  Had some teeth pulled. Prescribed amoxicillin and hydrocodone, which she used for breakthrough pain. Last dose 2 days ago.    Review of Systems No chest pain, SOB, HA, dizziness, vision change, N/V, diarrhea, constipation, dysuria, urinary urgency or frequency, myalgias, new/unexplained arthralgias or rash.     Patient Active Problem List   Diagnosis Date Noted  . 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 IBS (irritable bowel syndrome) 06/03/2014  . Anxiety 03/09/2014  . Obesity (BMI 30-39.9) 10/27/2013  . Neck pain on left side 09/02/2013  . Cervical radiculopathy 08/18/2013  . Chronic pain disorder 08/16/2013  . Lumbar back pain 11/21/2012  . Esophageal stricture 11/21/2012  . ADD (attention deficit disorder) 11/21/2012  . Hypertriglyceridemia 11/21/2012  . Carpal tunnel syndrome, bilateral 07/05/2012  . OSA (obstructive sleep apnea) 06/28/2012  . Constipation 01/01/2012  . Migraine headache 03/02/2011  . Depression 03/02/2011  . History of substance abuse 03/02/2011  . Hypertension 03/02/2011     Prior to Admission medications   Medication Sig Start Date End Date Taking? Authorizing Provider  acetaminophen (TYLENOL) 500 MG tablet Take 1,000 mg by mouth every 6 (six) hours as needed for moderate pain.   Yes [provider]  alprazolam Prudy Feeler) 2 MG tablet Take 0.5-1 tablets (1-2 mg total) by mouth 2 (two) times daily as needed for sleep. 11/11/17  Yes Nickie Warwick, PA-C  alprazolam (XANAX) 2 MG tablet Take  0.5-1 tablets (1-2 mg total) by mouth 2 (two) times daily as needed for sleep. 11/11/17  Yes Tatyanna Cronk, PA-C  alprazolam (XANAX) 2 MG tablet Take 0.5-1 tablets (1-2 mg total) by mouth 2 (two) times daily as needed for sleep. 11/11/17  Yes Demitra Danley, PA-C  amLODipine (NORVASC) 10 MG tablet TAKE 1 TABLET BY MOUTH AT BEDTIME. 03/27/17  Yes Viktoriya Glaspy, PA-C  amphetamine-dextroamphetamine (ADDERALL) 20 MG tablet Take 1 tablet (20 mg total) by mouth 3 (three) times daily. 11/11/17  Yes Jeremyah Jelley, PA-C  amphetamine-dextroamphetamine (ADDERALL) 20 MG tablet Take 1 tablet (20 mg total) by mouth 3 (three) times daily. 11/11/17  Yes Arisha Gervais, PA-C  amphetamine-dextroamphetamine (ADDERALL) 20 MG tablet Take 1 tablet (20 mg total) by mouth 3 (three) times daily. 11/11/17  Yes Herminia Warren, PA-C  ARIPiprazole (ABILIFY) 10 MG tablet TAKE 1 TABLET BY MOUTH EVERY DAY 11/05/17  Yes Rony Ratz, PA-C  atorvastatin (LIPITOR) 20 MG tablet TAKE 1 TABLET BY MOUTH EVERY DAY 08/01/17  Yes Philipp Callegari, PA-C  ciprofloxacin-hydrocortisone (CIPRO HC) otic suspension Place 3 drops into the right ear 2 (two) times daily. 07/06/15  Yes Weber, Dema Severin, PA-C  EPINEPHrine (EPIPEN) 0.3 mg/0.3 mL IJ SOAJ injection Inject 0.3 mLs (0.3 mg total) into the muscle once as needed (Anaphylaxis). 06/20/16  Yes Samanvitha Germany, PA-C  EPINEPHrine 0.3 mg/0.3 mL IJ SOAJ injection Inject into the muscle.   Yes [provider]  ibuprofen (ADVIL,MOTRIN) 200 MG tablet Take 600 mg by mouth 2 (two) times daily as needed for pain.   Yes [provider]  lubiprostone (AMITIZA) 24 MCG capsule Take 1 capsule (24 mcg total) by mouth 2 (two) times daily with a meal. 07/06/15  Yes Weber, Sarah L, PA-C  montelukast (SINGULAIR) 10 MG tablet Take 1 tablet (10 mg total) by mouth at bedtime. 02/15/17  Yes Gazella Anglin, PA-C  ondansetron (ZOFRAN-ODT) 8 MG disintegrating tablet DISSOLVE 1 TABLET BY MOUTH EVERY 8 HOURS AS  NEEDED FOR NAUSEA 11/05/17  Yes Fiorella Hanahan, PA-C  oxyCODONE (OXYCONTIN) 60 MG 12 hr tablet Take 120 mg by mouth every 12 (twelve) hours. 08/14/17  Yes Graceann Boileau, PA-C  oxyCODONE (OXYCONTIN) 60 MG 12 hr tablet Take 120 mg by mouth every 12 (twelve) hours. 08/14/17  Yes Laquashia Mergenthaler, PA-C  oxyCODONE (OXYCONTIN) 60 MG 12 hr tablet Take 120 mg by mouth every 12 (twelve) hours. 08/14/17  Yes Quincee Gittens, PA-C  pantoprazole (PROTONIX) 40 MG tablet Take 1 tablet (40 mg total) by mouth 2 (two) times daily. 11/07/16  Yes Jazalyn Mondor, PA-C  sertraline (ZOLOFT) 100 MG tablet TAKE 1 TABLET BY MOUTH EVERY DAY 01/01/17  Yes Keiron Iodice, PA-C  valACYclovir (VALTREX) 1000 MG tablet Take 1 tablet (1,000 mg total) by mouth daily. 06/16/15  Yes Weber, Dema Severin, PA-C     Allergies  Allergen Reactions  . Imitrex [Sumatriptan Base] Shortness Of Breath and Other (See Comments)    TACHYCARDIA  . Trazodone And Nefazodone Other (See Comments)    NIGHTMARES  . Wellbutrin [Bupropion] Other (See Comments)    "crawling out of my skin"  . Effexor [Venlafaxine] Other (See Comments)    MAKES HER "JITTERY"  . Metoclopramide Hcl Itching  . Morphine And Related Itching  . Tegretol [Carbamazepine] Itching  . Topamax Itching       Objective:  Physical Exam  Constitutional: She is oriented to person, place, and time. She appears well-developed and well-nourished. She is active and cooperative. No distress.  BP 102/78 (BP Location: Left Arm, Patient Position: Sitting, Cuff Size: Normal)   Pulse (!) 115   Temp 98.6 F (37 C) (Oral)   Resp 18   Ht 5' 6.14" (1.68 m)   Wt 203 lb (92.1 kg)   SpO2 98%   BMI 32.63 kg/m   HENT:  Head: Normocephalic and atraumatic.  Right Ear: Hearing normal.  Left Ear: Hearing normal.  Eyes: Conjunctivae are normal. No scleral icterus.  Neck: Normal range of motion. Neck supple. No thyromegaly present.  Cardiovascular: Normal rate, regular rhythm and normal heart  sounds.  Pulses:      Radial pulses are 2+ on the right side, and 2+ on the left side.  Pulmonary/Chest: Effort normal and breath sounds normal.  Musculoskeletal: She exhibits tenderness (low back tenderness to light touch).  Lymphadenopathy:       Head (right side): No tonsillar, no preauricular, no posterior auricular and no occipital adenopathy present.       Head (left side): No tonsillar, no preauricular, no posterior auricular and no occipital adenopathy present.    She has no cervical adenopathy.       Right: No supraclavicular adenopathy present.       Left: No supraclavicular adenopathy present.  Neurological: She is alert and oriented to person, place, and time. No sensory deficit.  Skin: Skin is warm, dry and intact. No rash noted. No cyanosis or erythema. Nails show no clubbing.  Psychiatric: She has a normal mood and affect. Her speech is normal and behavior is normal. Judgment and thought content normal. Cognition  and memory are normal.           Assessment & Plan:   Problem List Items Addressed This Visit    Depression (Chronic)   Relevant Medications   alprazolam (XANAX) 2 MG tablet   alprazolam (XANAX) 2 MG tablet   alprazolam (XANAX) 2 MG tablet   ADD (attention deficit disorder) (Chronic)   Relevant Orders   TSH (Completed)   Hypertriglyceridemia (Chronic)   Relevant Orders   Comprehensive metabolic panel (Completed)   Lipid panel (Completed)   Hypertension - Primary   Relevant Orders   CBC with Differential/Platelet (Completed)   Comprehensive metabolic panel (Completed)   TSH (Completed)   Chronic pain disorder   Relevant Medications   oxyCODONE (OXYCONTIN) 60 MG 12 hr tablet   oxyCODONE (OXYCONTIN) 60 MG 12 hr tablet   oxyCODONE (OXYCONTIN) 60 MG 12 hr tablet   Other Relevant Orders   Comprehensive metabolic panel (Completed)   TSH (Completed)   ToxASSURE Select 13 (MW), Urine (Completed)   Anxiety   Relevant Medications   alprazolam (XANAX) 2  MG tablet   alprazolam (XANAX) 2 MG tablet   alprazolam (XANAX) 2 MG tablet   Other Relevant Orders   TSH (Completed)       Return in about 4 months (around 03/13/2018) for re-evalaution of everything.   [name], PA-C Primary Care at [facility]

## 2017-11-14 ENCOUNTER — Telehealth: Payer: Self-pay | Admitting: Physician Assistant

## 2017-11-14 ENCOUNTER — Encounter: Payer: Self-pay | Admitting: Physician Assistant

## 2017-11-14 DIAGNOSIS — F988 Other specified behavioral and emotional disorders with onset usually occurring in childhood and adolescence: Secondary | ICD-10-CM

## 2017-11-14 LAB — COMPREHENSIVE METABOLIC PANEL
A/G RATIO: 1.4 (ref 1.2–2.2)
ALBUMIN: 4.4 g/dL (ref 3.5–5.5)
ALT: 21 IU/L (ref 0–32)
AST: 18 IU/L (ref 0–40)
Alkaline Phosphatase: 105 IU/L (ref 39–117)
BUN/Creatinine Ratio: 8 — ABNORMAL LOW (ref 9–23)
BUN: 6 mg/dL (ref 6–24)
Bilirubin Total: 0.3 mg/dL (ref 0.0–1.2)
CALCIUM: 9.2 mg/dL (ref 8.7–10.2)
CO2: 26 mmol/L (ref 20–29)
CREATININE: 0.72 mg/dL (ref 0.57–1.00)
Chloride: 99 mmol/L (ref 96–106)
GFR, EST AFRICAN AMERICAN: 115 mL/min/{1.73_m2} (ref >59)
GFR, EST NON AFRICAN AMERICAN: 99 mL/min/{1.73_m2} (ref >59)
Globulin, Total: 3.1 g/dL (ref 1.5–4.5)
Glucose: 124 mg/dL — ABNORMAL HIGH (ref 65–99)
Potassium: 4 mmol/L (ref 3.5–5.2)
SODIUM: 141 mmol/L (ref 134–144)
TOTAL PROTEIN: 7.5 g/dL (ref 6.0–8.5)

## 2017-11-14 LAB — CBC WITH DIFFERENTIAL/PLATELET
BASOS: 1 %
Basophils Absolute: 0.1 10*3/uL (ref 0.0–0.2)
EOS (ABSOLUTE): 0.3 10*3/uL (ref 0.0–0.4)
Eos: 3 %
HEMATOCRIT: 37.9 % (ref 34.0–46.6)
HEMOGLOBIN: 12.7 g/dL (ref 11.1–15.9)
IMMATURE GRANS (ABS): 0 10*3/uL (ref 0.0–0.1)
Immature Granulocytes: 0 %
Lymphocytes Absolute: 3.5 10*3/uL — ABNORMAL HIGH (ref 0.7–3.1)
Lymphs: 44 %
MCH: 28 pg (ref 26.6–33.0)
MCHC: 33.5 g/dL (ref 31.5–35.7)
MCV: 84 fL (ref 79–97)
MONOCYTES: 6 %
Monocytes Absolute: 0.5 10*3/uL (ref 0.1–0.9)
NEUTROS PCT: 46 %
Neutrophils Absolute: 3.7 10*3/uL (ref 1.4–7.0)
Platelets: 338 10*3/uL (ref 150–379)
RBC: 4.53 x10E6/uL (ref 3.77–5.28)
RDW: 14 % (ref 12.3–15.4)
WBC: 8.1 10*3/uL (ref 3.4–10.8)

## 2017-11-14 LAB — LIPID PANEL
CHOL/HDL RATIO: 5.8 ratio — AB (ref 0.0–4.4)
Cholesterol, Total: 122 mg/dL (ref 100–199)
HDL: 21 mg/dL — ABNORMAL LOW (ref >39)
LDL CALC: 62 mg/dL (ref 0–99)
TRIGLYCERIDES: 193 mg/dL — AB (ref 0–149)
VLDL Cholesterol Cal: 39 mg/dL (ref 5–40)

## 2017-11-14 LAB — TSH: TSH: 3.3 u[IU]/mL (ref 0.450–4.500)

## 2017-11-14 MED ORDER — AMPHETAMINE-DEXTROAMPHETAMINE 20 MG PO TABS: 20.0000 mg | ORAL_TABLET | Freq: Three times a day (TID) | ORAL | 0 refills | Status: DC

## 2017-11-14 NOTE — Telephone Encounter (Signed)
Patient's pharmacy is out of Adderall. Requested it be sent to another.  Rx sent electronically.  Meds ordered this encounter  Medications  . amphetamine-dextroamphetamine (ADDERALL) 20 MG tablet    Sig: Take 1 tablet (20 mg total) by mouth 3 (three) times daily.    Dispense:  90 tablet    Refill:  0    Order Specific Question:   Supervising Provider    Answer:   SHAW, EVA N [[id number]]  . amphetamine-dextroamphetamine (ADDERALL) 20 MG tablet    Sig: Take 1 tablet (20 mg total) by mouth 3 (three) times daily.    Dispense:  90 tablet    Refill:  0    May fill 60 days after date on prescription    Order Specific Question:   Supervising Provider    Answer:   Clelia CroftSHAW, EVA N [[id number]]  . amphetamine-dextroamphetamine (ADDERALL) 20 MG tablet    Sig: Take 1 tablet (20 mg total) by mouth 3 (three) times daily.    Dispense:  90 tablet    Refill:  0    May fill 30 days after date on prescription    Order Specific Question:   Supervising Provider    Answer:   Clelia CroftSHAW, EVA N [[id number]]

## 2017-11-14 NOTE — Telephone Encounter (Unsigned)
Copied from CRM [phone]. Topic: Quick Communication - See Telephone Encounter >> Nov 14, 2017 12:21 PM Waymon AmatoBurton, Donna F wrote: CRM for notification. See Telephone encounter for: pt was seen by [name] yesterday and she [facility] [address]-and they are out the prescription so [facility] [address]   Best number [phone]   11/14/17.

## 2017-11-14 NOTE — Telephone Encounter (Signed)
Copied from CRM [phone]. Topic: Quick Communication - See Telephone Encounter >> Nov 14, 2017 12:21 PM Waymon AmatoBurton, Donna F wrote: CRM for notification. See Telephone encounter for:  11/14/17.

## 2017-11-14 NOTE — Telephone Encounter (Signed)
[facility] [address] [address].

## 2017-11-14 NOTE — Telephone Encounter (Signed)
Has been taken care via MyChart message.

## 2017-11-16 LAB — TOXASSURE SELECT 13 (MW), URINE

## 2017-11-25 NOTE — Assessment & Plan Note (Signed)
Well controlled with sertraline and Abilify. No changes today.

## 2017-11-25 NOTE — Assessment & Plan Note (Signed)
Well controlled. No changes today. Continue amlodipine 10 mg daily.

## 2017-11-25 NOTE — Assessment & Plan Note (Signed)
Stable/controlled. Continue current dose Adderall.

## 2017-11-25 NOTE — Assessment & Plan Note (Addendum)
Tolerating atorvastatin. Await labs. Adjust regimen as indicated by results.

## 2017-11-25 NOTE — Assessment & Plan Note (Signed)
Stable/controlled on current treatment. Continue sertraline, Abilfy and alprazolam.

## 2017-11-25 NOTE — Assessment & Plan Note (Signed)
Update UDS, high risk. Continue current regimen.

## 2017-12-06 ENCOUNTER — Encounter: Payer: Self-pay | Admitting: Physician Assistant

## 2017-12-06 MED ORDER — OXYCODONE ER 27 MG PO C12A: EXTENDED_RELEASE_CAPSULE | ORAL | 0 refills | Status: DC

## 2017-12-06 NOTE — Telephone Encounter (Signed)
Rx sent electronically.  Meds ordered this encounter  Medications  . OxyCODONE ER (XTAMPZA ER) 27 MG C12A    Sig: Take 54 mg by mouth every morning AND 108 mg every evening. TAKE 12 HOURS APART.    Dispense:  180 each    Refill:  0    This is to replace Oxycontin, as patient's insurance requires trial of preferred brands.    Order Specific Question:   Supervising Provider    Answer:   Sherren MochaSHAW, EVA N [[id number]]   Please fax controlled substance agreement, per fax from pharmacy.

## 2017-12-07 ENCOUNTER — Encounter: Payer: Self-pay | Admitting: Physician Assistant

## 2017-12-09 ENCOUNTER — Other Ambulatory Visit: Payer: Self-pay | Admitting: Physician Assistant

## 2017-12-09 DIAGNOSIS — F329 Major depressive disorder, single episode, unspecified: Secondary | ICD-10-CM

## 2017-12-09 DIAGNOSIS — F32A Depression, unspecified: Secondary | ICD-10-CM

## 2017-12-11 ENCOUNTER — Telehealth: Payer: Self-pay | Admitting: Physician Assistant

## 2017-12-11 NOTE — Telephone Encounter (Signed)
Copied from CRM [phone]. Topic: General - Other >> Dec 11, 2017  2:47 PM Oneal GroutSebastian, Jennifer S wrote: Reason for CRM: PA needed on oxyCODONE (OXYCONTIN) 60 MG 12 hr tablet

## 2017-12-11 NOTE — Telephone Encounter (Signed)
Contract signed. See photo in media. Original to patient.

## 2017-12-11 NOTE — Telephone Encounter (Signed)
Called pharmacy and confirmed that pt has refills available at pharmacy. Pt has been notified.

## 2017-12-12 ENCOUNTER — Encounter: Payer: Self-pay | Admitting: Physician Assistant

## 2017-12-12 ENCOUNTER — Telehealth: Payer: Self-pay | Admitting: Physician Assistant

## 2017-12-12 NOTE — Telephone Encounter (Signed)
Copied from CRM [phone]. Topic: Quick Communication - See Telephone Encounter >> Dec 12, 2017  4:15 PM Windy KalataMichael, Quentyn Kolbeck L, NT wrote: CRM for notification. See Telephone encounter for:  12/12/17.  [facility] [address] [facility] needing prior authorization for refill request OxyCODONE ER (XTAMPZA ER) 27 MG.

## 2017-12-13 ENCOUNTER — Telehealth: Payer: Self-pay | Admitting: Physician Assistant

## 2017-12-13 NOTE — Telephone Encounter (Signed)
Dr. Marcos EkeGonzales medical director called and is needing to speak w/ someone about prior auth, contact her at the number associated w/ this message  Reference # [id number] for prior auth

## 2017-12-13 NOTE — Telephone Encounter (Signed)
Copied from CRM [phone]. Topic: Inquiry >> Dec 13, 2017 12:27 PM Everardo PacificMoton, Maira Christon, [place] wrote: Reason for CRM: Meade MawKatia called because she needs the controled substance contract faxed over to them for the patients Xtampza Er. There fax number is [phone] and she can be reached at [phone]. Please included the prior authorization number which is  [id number] as well

## 2017-12-14 NOTE — Telephone Encounter (Signed)
Resubmitted request for Pre-Auth on new Rx via [employer] web site.  Call to pt to advise that info has been resubmitted.  No answer.  No release to leave message.

## 2017-12-14 NOTE — Telephone Encounter (Signed)
Please advise 

## 2017-12-15 ENCOUNTER — Encounter: Payer: Self-pay | Admitting: Physician Assistant

## 2017-12-17 NOTE — Telephone Encounter (Signed)
[employer] calling again to see if the office can refax everything due to [facility] not being able to approve the already denied claim for Xtampza. Need to refax the prior auth, medical records, pain agreement, everything sent intially with the prior auth to [place] at [employer]. FAX#: [phone]

## 2017-12-18 ENCOUNTER — Telehealth: Payer: Self-pay | Admitting: Physician Assistant

## 2017-12-18 NOTE — Telephone Encounter (Signed)
Still awaiting PA for [facility] (preferred oxycod[facility] ER product). The contract we signed has not gone through, despite having watched it be faxed and seeing the receipt of confirmation.  At the patient's request, I called to speak with the supervisor of the case: Thomes DinningDavid Scott (EID [id number]).  I spoke with [facility], who placed me on a "brief hold while I do some research." "I'm transferring you to the Claims Department. They'll have more information that they can pull up."  Provider Relations automated message menu.  Adam does not have full access to the pharmacy side, so transferred me to the Pharmacy side, where "they have more access to the claim."  Jan advised that the medication was not approved because there was no documentation of additional precautions considered. She was unable to tell me what those would be (what would meet their criteria), and advised that I need to speak to the Prior Authorizations Department.

## 2017-12-18 NOTE — Telephone Encounter (Signed)
Call from pt re PA status.  [facility] to [facility].  States request for new Rx was denied on 02/08 d/t no pain contract.  Advised that we were told to complete PA request online and site would not allow contract to be attached but that it was faxed in.  Per [employer], need contract refaxed along with a completed patient fax form, which they will be sending to us.  L/m for pt that we are waiting on new form from [facility] and will complete asap.

## 2017-12-19 NOTE — Telephone Encounter (Signed)
Dr Victorino DecemberGonzalos from [employer] called and said she needs the pts pain management agreement. Her contact number is [phone]   Ref # [id number]

## 2017-12-19 NOTE — Telephone Encounter (Signed)
Call from pt stating prior auth hasn't gone through.  [facility] who stated "patient fax form" needs completed.  Will fax copy to office.  Pt arrived at office.  [facility] to [facility]/u on fax.  Will refax.  [employer] rep stated they are unsure what the 'patient fax form' is but need to have opioid form completed.  [facility] to [facility] again, will refax.  Received Opioid form.  Completed in entirety and signed by provider.  Sent to [employer].

## 2017-12-20 ENCOUNTER — Ambulatory Visit (INDEPENDENT_AMBULATORY_CARE_PROVIDER_SITE_OTHER): Payer: Managed Care, Other (non HMO)

## 2017-12-20 ENCOUNTER — Encounter: Payer: Self-pay | Admitting: Urgent Care

## 2017-12-20 ENCOUNTER — Ambulatory Visit: Payer: Managed Care, Other (non HMO) | Admitting: Urgent Care

## 2017-12-20 ENCOUNTER — Telehealth: Payer: Self-pay | Admitting: *Deleted

## 2017-12-20 ENCOUNTER — Telehealth: Payer: Self-pay

## 2017-12-20 VITALS — BP 123/79 | HR 100 | Temp 97.9°F | Resp 16 | Ht 66.0 in | Wt 202.4 lb

## 2017-12-20 DIAGNOSIS — S8992XA Unspecified injury of left lower leg, initial encounter: Secondary | ICD-10-CM | POA: Diagnosis not present

## 2017-12-20 DIAGNOSIS — S8392XA Sprain of unspecified site of left knee, initial encounter: Secondary | ICD-10-CM | POA: Diagnosis not present

## 2017-12-20 MED ORDER — CELECOXIB 100 MG PO CAPS: 100.0000 mg | ORAL_CAPSULE | Freq: Two times a day (BID) | ORAL | 1 refills | Status: DC

## 2017-12-20 NOTE — Telephone Encounter (Signed)
Spoke with patient advised we received a letter that medication was approved 12/19/2017 to 03/18/2018.  Xtampza er 27 mg capsule

## 2017-12-20 NOTE — Telephone Encounter (Signed)
Message received that pt's pharmacy has not received approval for [facility].  Call to pharmacy to advise of approval.  States they may have to order med but that they will update pt.  Call to pt.  No answer, v/m full.

## 2017-12-20 NOTE — Progress Notes (Signed)
MRN: [id number] DOB: Jun 28, 1971  Subjective:   [name] is a 47 y.o. female presenting for left knee injury today. She states that she twisted her left knee when she got out of bed this morning. Has 5/10 pain, elicited with bending. States that she can walk without pain, bear weight.   Tuesday has a current medication list which includes the following prescription(s): acetaminophen, alprazolam, alprazolam, alprazolam, amlodipine, amoxicillin, amphetamine-dextroamphetamine, amphetamine-dextroamphetamine, amphetamine-dextroamphetamine, aripiprazole, atorvastatin, ciprofloxacin-hydrocortisone, epinephrine, epinephrine, hydrocodone-acetaminophen, ibuprofen, lubiprostone, montelukast, ondansetron, oxycodone, oxycodone, oxycodone, oxycodone er, pantoprazole, sertraline, and valacyclovir. Also is allergic to imitrex [sumatriptan base]; trazodone and nefazodone; wellbutrin [bupropion]; effexor [venlafaxine]; metoclopramide hcl; morphine and related; tegretol [carbamazepine]; and topamax.  [name]  has a past medical history of Anxiety, Arthritis, Cervical muscle strain (07/2013), Complication of anesthesia (03/2011), Dental crowns present, Depression, Drug-seeking behavior (08/16/2013), Genital herpes simplex type 2, GERD (gastroesophageal reflux disease), Herniated disc, cervical, History of alcohol abuse, History of cocaine abuse, History of esophageal dilatation, History of kidney stones, History of pancreatitis (2012), Hypertension, Medial meniscus tear (07/2013), Migraine headache, Sleep apnea, and Substance abuse (HCC). Also  has a past surgical history that includes Abdominal hysterectomy; Cesarean section; Nasal septum surgery; Carpal tunnel release (07/29/2012); laparoscopic appendectomy (02/21/2006); Laparoscopic lysis of adhesions (02/21/2006); Cholecystectomy (04/05/2010); ERCP w/ metal stent placement (03/14/2011); Cystoscopy w/ ureteral stent placement (Right, 06/11/2007); Nasal septoplasty w/ turbinoplasty (Bilateral,  07/26/2009); Knee arthroscopy (Left); Knee arthroscopy with medial menisectomy (Right, 07/31/2013); Appendectomy; Colonoscopy; ERCP; and Upper gastrointestinal endoscopy.  Objective:   Vitals: BP 123/79   Pulse 100   Temp 97.9 F (36.6 C) (Oral)   Resp 16   Ht 5\' 6"  (1.676 m)   Wt 202 lb 6.4 oz (91.8 kg)   SpO2 96%   BMI 32.67 kg/m   Physical Exam  Constitutional: She is oriented to person, place, and time. She appears well-developed and well-nourished.  Cardiovascular: Normal rate.  Pulmonary/Chest: Effort normal.  Musculoskeletal:       Left knee: She exhibits decreased range of motion (full flexion) and swelling (trace). She exhibits no ecchymosis, no deformity, no laceration, no erythema, normal patellar mobility and no bony tenderness. Tenderness found. Medial joint line and lateral joint line tenderness noted. No patellar tendon tenderness noted.       Legs: Neurological: She is alert and oriented to person, place, and time.   Dg Knee Complete 4 Views Left  Result Date: 12/20/2017 CLINICAL DATA:  Left knee pain after injury. EXAM: LEFT KNEE - COMPLETE 4+ VIEW COMPARISON:  Radiographs of May 23, 2010. FINDINGS: No evidence of fracture, dislocation, or joint effusion. No evidence of arthropathy or other focal bone abnormality. Soft tissues are unremarkable. IMPRESSION: Normal left knee. Electronically Signed   By: Lupita Raider, M.D.   On: 12/20/2017 12:00    Assessment and Plan :   Injury of left knee, initial encounter - Plan: DG Knee Complete 4 Views Left, Ambulatory referral to Orthopedic Surgery  Sprain of left knee, unspecified ligament, initial encounter - Plan: Ambulatory referral to Orthopedic Surgery  Recommended icing, start Celebrex, rest. Patient declined work note. Referral to ortho is pending. Return-to-clinic precautions discussed, patient verbalized understanding. Will forward to PA-Jeffery for her review.   Wallis Bamberg, PA-C Primary Care at [facility] [phone] 12/20/2017  12:13 PM

## 2017-12-20 NOTE — Patient Instructions (Addendum)
Please ice your left knee for 20 minutes every 2 hours for the first 24-48 hours.   Knee Sprain, Adult A knee sprain is a stretch or tear in a knee ligament. Knee ligaments are bands of tissue that connect bones in the knee to each other. What are the causes? This condition often results from:  A fall.  An injury to the knee.  What are the signs or symptoms? Symptoms of this condition include:  Trouble bending the leg.  Swelling in the knee.  Bruising around the knee.  Tenderness or pain in the knee.  Muscle spasms around the knee.  How is this diagnosed? This condition may be diagnosed based on:  A physical exam.  What happened just before you started to have symptoms.  Tests, including: ? An X-ray. This may be done to make sure no bones are broken. ? An MRI. This may be done to check if the ligament is torn. ? Stress testing of the knee. This may be done to check ligament damage.  How is this treated? Treatment for this condition may involve:  Keeping the knee still (immobilized) with a cast, brace, or splint.  Applying ice to the knee. This helps with pain and swelling.  Keeping the knee raised (elevated) above the level of your heart when you are resting. This helps with pain and swelling.  Taking medicine for pain.  Exercises to prevent or limit permanent weakness or stiffness in your knee.  Surgery to reconnect the ligament to the bone or to reconstruct it. This may be needed if the ligament tore all the way.  Follow these instructions at home: If you have a splint or brace:  Wear the splint or brace as told by your health care provider. Remove it only as told by your health care provider.  Loosen the splint or brace if your toes tingle, become numb, or turn cold and blue.  Keep the splint or brace clean.  If the splint or brace is not waterproof: ? Do not let it get wet. ? Cover it with a watertight covering when you take a bath or a shower. If  you have a cast:  Do not stick anything inside the cast to scratch your skin. Doing that increases your risk of infection.  Check the skin around the cast every day. Tell your health care provider about any concerns.  You may put lotion on dry skin around the edges of the cast. Do not put lotion on the skin underneath the cast.  Keep the cast clean.  If the cast is not waterproof: ? Do not let it get wet. ? Cover it with a watertight covering when you take a bath or a shower. Managing pain, stiffness, and swelling   If directed, put ice on the injured area. ? If you have a removable splint or brace, remove it as told by your health care provider. ? Put ice in a plastic bag. ? Place a towel between your skin and the bag or between your cast and the bag. ? Leave the ice on for 20 minutes, 2-3 times a day.  Gently move your toes often to avoid stiffness and to lessen swelling.  Elevate the injured area above the level of your heart while you are sitting or lying down.  Take over-the-counter and prescription medicines only as told by your health care provider. General instructions  Do exercises as told by your health care provider.  Keep all follow-up visits as told  by your health care provider. This is important. Contact a health care provider if:  You have pain that gets worse.  The cast, brace, or splint does not fit right.  The cast, brace, or splint gets damaged. Get help right away if:  You cannot use your injured joint to support any of your body weight (cannot bear weight).  You cannot move the injured joint.  You cannot walk more than a few steps without pain or without your knee buckling.  You have significant pain, swelling, or numbness below the cast, brace, or splint. This information is not intended to replace advice given to you by your health care provider. Make sure you discuss any questions you have with your health care provider. Document Released:  10/23/2005 Document Revised: 07/12/2016 Document Reviewed: 05/12/2016 Elsevier Interactive Patient Education  2018 [employer]    IF you received an x-ray today, you will receive an invoice [facility]. Please contact [facility] at [phone] with questions or concerns regarding your invoice.   IF you received labwork today, you will receive an invoice from [facility]. Please contact [facility] at [phone] with questions or concerns regarding your invoice.   Our billing staff will not be able to assist you with questions regarding bills from these companies.  You will be contacted with the lab results as soon as they are available. The fastest way to get your results is to activate your My Chart account. Instructions are located on the last page of this paperwork. If you have not heard from us regarding the results in 2 weeks, please contact this office.

## 2017-12-21 NOTE — Telephone Encounter (Signed)
Per chart, medication PA approved. Documented call to patent, no answer

## 2017-12-22 ENCOUNTER — Other Ambulatory Visit: Payer: Self-pay | Admitting: Physician Assistant

## 2017-12-22 DIAGNOSIS — K222 Esophageal obstruction: Secondary | ICD-10-CM

## 2017-12-23 ENCOUNTER — Telehealth: Payer: Self-pay | Admitting: Physician Assistant

## 2017-12-23 DIAGNOSIS — M545 Low back pain, unspecified: Secondary | ICD-10-CM

## 2017-12-23 DIAGNOSIS — F32A Depression, unspecified: Secondary | ICD-10-CM

## 2017-12-23 DIAGNOSIS — M542 Cervicalgia: Secondary | ICD-10-CM

## 2017-12-23 DIAGNOSIS — F419 Anxiety disorder, unspecified: Secondary | ICD-10-CM

## 2017-12-23 DIAGNOSIS — F329 Major depressive disorder, single episode, unspecified: Secondary | ICD-10-CM

## 2017-12-23 MED ORDER — NON FORMULARY: 99 refills | Status: DC

## 2017-12-23 NOTE — Telephone Encounter (Signed)
Can use [employer] to pay for massage if has a prescription.  DX: Anxiety, depression, chronic pain of the lumbar and cervical spine  Rx printed.  Patient's wife, Drinda Buttsnnette, is aware and will pick up.

## 2017-12-25 ENCOUNTER — Telehealth: Payer: Self-pay

## 2017-12-25 ENCOUNTER — Other Ambulatory Visit: Payer: Self-pay

## 2017-12-25 DIAGNOSIS — M545 Low back pain, unspecified: Secondary | ICD-10-CM

## 2017-12-25 DIAGNOSIS — M542 Cervicalgia: Secondary | ICD-10-CM

## 2017-12-25 DIAGNOSIS — F329 Major depressive disorder, single episode, unspecified: Secondary | ICD-10-CM

## 2017-12-25 DIAGNOSIS — F32A Depression, unspecified: Secondary | ICD-10-CM

## 2017-12-25 DIAGNOSIS — F419 Anxiety disorder, unspecified: Secondary | ICD-10-CM

## 2017-12-25 MED ORDER — NON FORMULARY: 99 refills | Status: AC

## 2017-12-25 NOTE — Telephone Encounter (Signed)
Error-Sign Encounter 

## 2018-01-17 ENCOUNTER — Encounter: Payer: Self-pay | Admitting: Physician Assistant

## 2018-01-18 MED ORDER — OXYCODONE ER 27 MG PO C12A: EXTENDED_RELEASE_CAPSULE | ORAL | 0 refills | Status: DC

## 2018-01-27 ENCOUNTER — Other Ambulatory Visit: Payer: Self-pay | Admitting: Physician Assistant

## 2018-01-27 DIAGNOSIS — F419 Anxiety disorder, unspecified: Secondary | ICD-10-CM

## 2018-01-27 DIAGNOSIS — F329 Major depressive disorder, single episode, unspecified: Secondary | ICD-10-CM

## 2018-01-27 DIAGNOSIS — F32A Depression, unspecified: Secondary | ICD-10-CM

## 2018-01-29 ENCOUNTER — Other Ambulatory Visit: Payer: Self-pay

## 2018-01-29 DIAGNOSIS — F329 Major depressive disorder, single episode, unspecified: Secondary | ICD-10-CM

## 2018-01-29 DIAGNOSIS — F419 Anxiety disorder, unspecified: Secondary | ICD-10-CM

## 2018-01-29 DIAGNOSIS — F32A Depression, unspecified: Secondary | ICD-10-CM

## 2018-01-30 ENCOUNTER — Other Ambulatory Visit: Payer: Self-pay

## 2018-01-30 ENCOUNTER — Other Ambulatory Visit: Payer: Self-pay | Admitting: Physician Assistant

## 2018-01-30 ENCOUNTER — Encounter: Payer: Self-pay | Admitting: Physician Assistant

## 2018-01-30 DIAGNOSIS — F32A Depression, unspecified: Secondary | ICD-10-CM

## 2018-01-30 DIAGNOSIS — F329 Major depressive disorder, single episode, unspecified: Secondary | ICD-10-CM

## 2018-01-30 DIAGNOSIS — F419 Anxiety disorder, unspecified: Secondary | ICD-10-CM

## 2018-01-30 MED ORDER — ONDANSETRON 8 MG PO TBDP: ORAL_TABLET | ORAL | 1 refills | Status: AC

## 2018-01-30 MED ORDER — ARIPIPRAZOLE 10 MG PO TABS: 10.0000 mg | ORAL_TABLET | Freq: Every day | ORAL | 1 refills | Status: DC

## 2018-01-30 NOTE — Telephone Encounter (Signed)
Meds ordered this encounter  Medications  . ARIPiprazole (ABILIFY) 10 MG tablet    Sig: Take 1 tablet (10 mg total) by mouth daily.    Dispense:  90 tablet    Refill:  1    Order Specific Question:   Supervising Provider    Answer:   SHAW, EVA N [[id number]]  . ondansetron (ZOFRAN-ODT) 8 MG disintegrating tablet    Sig: DISSOLVE 1 TABLET BY MOUTH EVERY 8 HOURS AS NEEDED FOR NAUSEA    Dispense:  90 tablet    Refill:  1    Order Specific Question:   Supervising Provider    Answer:   Clelia CroftSHAW, EVA N [[id number]]

## 2018-01-30 NOTE — Addendum Note (Signed)
Addended by: Fernande BrasJEFFERY, Claudio Mondry S on: 01/30/2018 12:44 PM   Modules accepted: Orders

## 2018-02-02 ENCOUNTER — Telehealth: Payer: Self-pay | Admitting: Physician Assistant

## 2018-02-02 DIAGNOSIS — F988 Other specified behavioral and emotional disorders with onset usually occurring in childhood and adolescence: Secondary | ICD-10-CM

## 2018-02-02 DIAGNOSIS — G894 Chronic pain syndrome: Secondary | ICD-10-CM

## 2018-02-02 DIAGNOSIS — F419 Anxiety disorder, unspecified: Secondary | ICD-10-CM

## 2018-02-02 MED ORDER — AMPHETAMINE-DEXTROAMPHETAMINE 20 MG PO TABS: 20.0000 mg | ORAL_TABLET | Freq: Three times a day (TID) | ORAL | 0 refills | Status: DC

## 2018-02-02 MED ORDER — ALPRAZOLAM 2 MG PO TABS: 1.0000 mg | ORAL_TABLET | Freq: Two times a day (BID) | ORAL | 0 refills | Status: DC | PRN

## 2018-02-02 MED ORDER — OXYCODONE ER 27 MG PO C12A: EXTENDED_RELEASE_CAPSULE | ORAL | 0 refills | Status: DC

## 2018-02-02 NOTE — Telephone Encounter (Signed)
Patient's wife sent this message in her own My Chart account: [name], Kimmie has lost her job today, thus lost her insurance. I will be able to get her on my insurance next week, but in the interum her prescriptions for her xanax and adderall will need to be sent to [facility]. They are due to be filled on April 5th.I am sure there will be hoops to jump through with her xtampza, but we can cross that bridge after this week. Her xtampza is due to be filled on april 14th. If you want to go ahead and send it so it is there, up to you. I am working hard to get her switched to my plan, paperwork ya know.   Thanks so much [employer] ordered this encounter  Medications  . alprazolam (XANAX) 2 MG tablet    Sig: Take 0.5-1 tablets (1-2 mg total) by mouth 2 (two) times daily as needed for sleep.    Dispense:  60 tablet    Refill:  0    Order Specific Question:   Supervising Provider    Answer:   SHAW, EVA N [[id number]]  . alprazolam (XANAX) 2 MG tablet    Sig: Take 0.5-1 tablets (1-2 mg total) by mouth 2 (two) times daily as needed for sleep.    Dispense:  60 tablet    Refill:  0    May fill 60 days after date on prescription    Order Specific Question:   Supervising Provider    Answer:   Clelia Croft, EVA N [[id number]]  . alprazolam (XANAX) 2 MG tablet    Sig: Take 0.5-1 tablets (1-2 mg total) by mouth 2 (two) times daily as needed for sleep.    Dispense:  60 tablet    Refill:  0    May fill 30 days after date on prescription    Order Specific Question:   Supervising Provider    Answer:   Clelia Croft, EVA N [[id number]]  . oxyCODONE ER (XTAMPZA ER) 27 MG C12A    Sig: Take 54 mg by mouth every morning AND 108 mg every evening. TAKE 12 HOURS APART.    Dispense:  180 each    Refill:  0    This is to replace Oxycontin, as patient's insurance requires trial of preferred brands.    Order Specific Question:   Supervising Provider    Answer:   Clelia Croft, EVA N [[id number]]  . amphetamine-dextroamphetamine (ADDERALL) 20  MG tablet    Sig: Take 1 tablet (20 mg total) by mouth 3 (three) times daily.    Dispense:  90 tablet    Refill:  0    May fill 30 days after date on prescription    Order Specific Question:   Supervising Provider    Answer:   Clelia Croft, EVA N [[id number]]  . amphetamine-dextroamphetamine (ADDERALL) 20 MG tablet    Sig: Take 1 tablet (20 mg total) by mouth 3 (three) times daily.    Dispense:  90 tablet    Refill:  0    Order Specific Question:   Supervising Provider    Answer:   SHAW, EVA N [[id number]]  . amphetamine-dextroamphetamine (ADDERALL) 20 MG tablet    Sig: Take 1 tablet (20 mg total) by mouth 3 (three) times daily.    Dispense:  90 tablet    Refill:  0    May fill 60 days after date on prescription    Order Specific Question:   Supervising Provider  Answer:   Clelia CroftSHAW, EVA N [[id number]]

## 2018-02-07 ENCOUNTER — Encounter: Payer: Self-pay | Admitting: Physician Assistant

## 2018-02-11 ENCOUNTER — Encounter: Payer: Self-pay | Admitting: Physician Assistant

## 2018-02-15 ENCOUNTER — Encounter: Payer: Self-pay | Admitting: Physician Assistant

## 2018-02-15 DIAGNOSIS — G894 Chronic pain syndrome: Secondary | ICD-10-CM

## 2018-02-15 MED ORDER — OXYCODONE ER 27 MG PO C12A: EXTENDED_RELEASE_CAPSULE | ORAL | 0 refills | Status: DC

## 2018-02-15 NOTE — Telephone Encounter (Signed)
Patient's wife brought in a letter, with same message as this e-mail.  Meds ordered this encounter  Medications  . oxyCODONE ER (XTAMPZA ER) 27 MG C12A    Sig: Take 54 mg by mouth every morning AND 108 mg every evening. TAKE 12 HOURS APART.    Dispense:  180 each    Refill:  0    This is to replace Oxycontin, as patient's insurance requires trial of preferred brands.    Order Specific Question:   Supervising Provider    Answer:   Sherren MochaSHAW, EVA N [phone][[id number]]

## 2018-02-25 ENCOUNTER — Telehealth: Payer: Self-pay | Admitting: *Deleted

## 2018-02-25 NOTE — Telephone Encounter (Signed)
Spoke with patient she will call back with new insurance need new number to finish prior authorization.

## 2018-02-26 ENCOUNTER — Telehealth: Payer: Self-pay | Admitting: *Deleted

## 2018-02-26 NOTE — Telephone Encounter (Signed)
Left message for patient to ask for julie/john/lizzie need medical card for medication benefits to start prior authorization.

## 2018-03-15 ENCOUNTER — Ambulatory Visit: Payer: Managed Care, Other (non HMO) | Admitting: Physician Assistant

## 2018-03-21 ENCOUNTER — Other Ambulatory Visit: Payer: Self-pay | Admitting: Physician Assistant

## 2018-03-21 NOTE — Telephone Encounter (Signed)
Pt has appt with [name] tomorrow and it will be addressed then

## 2018-03-21 NOTE — Telephone Encounter (Signed)
Rx refill: Norvasc 10 mg - patient has appointment tomorrow-03/22/18 May want to address at appointment in case patient needs change.  LOV: 02/02/18  PCP:  Leotis Shames  Pharmacy: verified

## 2018-03-22 ENCOUNTER — Ambulatory Visit: Payer: PRIVATE HEALTH INSURANCE | Admitting: Physician Assistant

## 2018-03-22 ENCOUNTER — Other Ambulatory Visit: Payer: Self-pay

## 2018-03-22 ENCOUNTER — Encounter: Payer: Self-pay | Admitting: Physician Assistant

## 2018-03-22 VITALS — BP 108/72 | HR 112 | Temp 99.0°F | Resp 16 | Ht 66.0 in | Wt 193.8 lb

## 2018-03-22 DIAGNOSIS — F329 Major depressive disorder, single episode, unspecified: Secondary | ICD-10-CM | POA: Diagnosis not present

## 2018-03-22 DIAGNOSIS — M545 Low back pain, unspecified: Secondary | ICD-10-CM

## 2018-03-22 DIAGNOSIS — I10 Essential (primary) hypertension: Secondary | ICD-10-CM | POA: Diagnosis not present

## 2018-03-22 DIAGNOSIS — F419 Anxiety disorder, unspecified: Secondary | ICD-10-CM

## 2018-03-22 DIAGNOSIS — F32A Depression, unspecified: Secondary | ICD-10-CM

## 2018-03-22 DIAGNOSIS — M5412 Radiculopathy, cervical region: Secondary | ICD-10-CM

## 2018-03-22 DIAGNOSIS — K222 Esophageal obstruction: Secondary | ICD-10-CM | POA: Diagnosis not present

## 2018-03-22 DIAGNOSIS — G894 Chronic pain syndrome: Secondary | ICD-10-CM

## 2018-03-22 DIAGNOSIS — F988 Other specified behavioral and emotional disorders with onset usually occurring in childhood and adolescence: Secondary | ICD-10-CM

## 2018-03-22 MED ORDER — ALPRAZOLAM 2 MG PO TABS: 1.0000 mg | ORAL_TABLET | Freq: Two times a day (BID) | ORAL | 0 refills | Status: AC | PRN

## 2018-03-22 MED ORDER — OXYCODONE ER 27 MG PO C12A: EXTENDED_RELEASE_CAPSULE | ORAL | 0 refills | Status: AC

## 2018-03-22 MED ORDER — AMPHETAMINE-DEXTROAMPHETAMINE 20 MG PO TABS: 20.0000 mg | ORAL_TABLET | Freq: Three times a day (TID) | ORAL | 0 refills | Status: AC

## 2018-03-22 MED ORDER — SERTRALINE HCL 100 MG PO TABS: 100.0000 mg | ORAL_TABLET | Freq: Every day | ORAL | 3 refills | Status: AC

## 2018-03-22 MED ORDER — AMPHETAMINE-DEXTROAMPHETAMINE 20 MG PO TABS: 20.0000 mg | ORAL_TABLET | Freq: Three times a day (TID) | ORAL | 0 refills | Status: DC

## 2018-03-22 MED ORDER — ARIPIPRAZOLE 10 MG PO TABS: 10.0000 mg | ORAL_TABLET | Freq: Every day | ORAL | 3 refills | Status: AC

## 2018-03-22 MED ORDER — ALPRAZOLAM 2 MG PO TABS: 1.0000 mg | ORAL_TABLET | Freq: Two times a day (BID) | ORAL | 0 refills | Status: DC | PRN

## 2018-03-22 MED ORDER — PANTOPRAZOLE SODIUM 40 MG PO TBEC: 40.0000 mg | DELAYED_RELEASE_TABLET | Freq: Every day | ORAL | 3 refills | Status: AC

## 2018-03-22 MED ORDER — AMLODIPINE BESYLATE 10 MG PO TABS: ORAL_TABLET | ORAL | 3 refills | Status: AC

## 2018-03-22 NOTE — Patient Instructions (Addendum)
Go ahead and call [facility] to schedule your next visit with me there. [phone].   IF you received an x-ray today, you will receive an invoice from [facility]. Please contact [facility] at [phone] with questions or concerns regarding your invoice.   IF you received labwork today, you will receive an invoice from [facility]. Please contact [facility] at [phone] with questions or concerns regarding your invoice.   Our billing staff will not be able to assist you with questions regarding bills from these companies.  You will be contacted with the lab results as soon as they are available. The fastest way to get your results is to activate your My Chart account. Instructions are located on the last page of this paperwork. If you have not heard from us regarding the results in 2 weeks, please contact this office.     

## 2018-03-22 NOTE — Progress Notes (Signed)
Patient ID: [name], female    DOB: [birth year], 47 y.o.   MRN: [id number]  PCP: Porfirio Oar, PA-C  Chief Complaint  Patient presents with  . Medication Refill    xanax, adderall, Oxycodone    Subjective:   Presents for evaluation of anxiety, depression, chronic neck, back and knee pain, and attention deficit disorder without hyperactivity.  She is accompanied by her wife, [name].  Medications are working well. No problems, adverse effects. Controlling her symptoms. Notes that celecoxib was ineffective at reducing any of her pain so she stopped it.  Has cut back on sodas, increased dietary protein. Was going to the gym, but her schedule doesn't accommodate that presently.  Review of Systems  Constitutional: Negative.   HENT: Negative for sore throat.   Eyes: Negative for visual disturbance.  Respiratory: Negative for cough, chest tightness, shortness of breath and wheezing.   Cardiovascular: Negative for chest pain and palpitations.  Gastrointestinal: Negative for abdominal pain, diarrhea, nausea and vomiting.  Endocrine: Negative.   Genitourinary: Negative for dysuria, frequency, hematuria and urgency.  Musculoskeletal: Positive for arthralgias (KNEES), back pain and neck pain. Negative for gait problem, joint swelling, myalgias and neck stiffness.  Skin: Negative for rash.  Allergic/Immunologic: Negative.   Neurological: Negative for dizziness, weakness and headaches.  Psychiatric/Behavioral: Negative for decreased concentration, dysphoric mood, self-injury and sleep disturbance. The patient is not nervous/anxious.    Depression screen [facility] 2/9 11/13/2017 06/21/2017 11/07/2016 08/29/2016 08/17/2016  Decreased Interest 0 0 0 0 0  Down, Depressed, Hopeless 0 0 0 0 0  PHQ - 2 Score 0 0 0 0 0  Some recent data might be hidden     Patient Active Problem List   Diagnosis Date Noted  . 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 IBS (irritable bowel syndrome) 06/03/2014  .  Anxiety 03/09/2014  . Obesity (BMI 30-39.9) 10/27/2013  . Neck pain on left side 09/02/2013  . Cervical radiculopathy 08/18/2013  . Chronic pain disorder 08/16/2013  . Lumbar back pain 11/21/2012  . Esophageal stricture 11/21/2012  . ADD (attention deficit disorder) 11/21/2012  . Hypertriglyceridemia 11/21/2012  . Carpal tunnel syndrome, bilateral 07/05/2012  . OSA (obstructive sleep apnea) 06/28/2012  . Constipation 01/01/2012  . Migraine headache 03/02/2011  . Depression 03/02/2011  . History of substance abuse 03/02/2011  . Hypertension 03/02/2011    Prior to Admission medications   Medication Sig Start Date End Date Taking? Authorizing Provider  alprazolam Prudy Feeler) 2 MG tablet Take 0.5-1 tablets (1-2 mg total) by mouth 2 (two) times daily as needed for sleep. 02/02/18  Yes Rielly Corlett, PA-C  amLODipine (NORVASC) 10 MG tablet TAKE 1 TABLET BY MOUTH AT BEDTIME. 03/27/17  Yes Azula Zappia, PA-C  amphetamine-dextroamphetamine (ADDERALL) 20 MG tablet Take 1 tablet (20 mg total) by mouth 3 (three) times daily. 02/02/18  Yes Trentyn Boisclair, PA-C  ARIPiprazole (ABILIFY) 10 MG tablet Take 1 tablet (10 mg total) by mouth daily. 01/30/18  Yes Kaizen Ibsen, PA-C  atorvastatin (LIPITOR) 20 MG tablet TAKE 1 TABLET BY MOUTH EVERY DAY 08/01/17  Yes Saleen Peden, PA-C  ibuprofen (ADVIL,MOTRIN) 200 MG tablet Take 600 mg by mouth 2 (two) times daily as needed for pain.   Yes [provider]  NON FORMULARY Massage Therapy 60 minutes every 2-4 weeks Dx: anxiety, depression, chronic pain of the lumbar and cervical spine 12/25/17  Yes Deklyn Gibbon, PA-C  ondansetron (ZOFRAN-ODT) 8 MG disintegrating tablet DISSOLVE 1 TABLET BY MOUTH EVERY 8 HOURS AS  NEEDED FOR NAUSEA 01/30/18  Yes Nolie Bignell, PA-C  oxyCODONE ER (XTAMPZA ER) 27 MG C12A Take 54 mg by mouth every morning AND 108 mg every evening. TAKE 12 HOURS APART. 02/15/18  Yes Nabilah Davoli, PA-C  pantoprazole (PROTONIX) 40 MG  tablet TAKE 1 TABLET BY MOUTH TWICE A DAY 12/22/17  Yes Benna Arno, PA-C  sertraline (ZOLOFT) 100 MG tablet TAKE 1 TABLET BY MOUTH EVERY DAY 12/10/17  Yes Malacki Mcphearson, PA-C  valACYclovir (VALTREX) 1000 MG tablet Take 1 tablet (1,000 mg total) by mouth daily. 06/16/15  Yes Weber, Dema Severin, PA-C  acetaminophen (TYLENOL) 500 MG tablet Take 1,000 mg by mouth every 6 (six) hours as needed for moderate pain.    [provider]  alprazolam Prudy Feeler) 2 MG tablet Take 0.5-1 tablets (1-2 mg total) by mouth 2 (two) times daily as needed for sleep. Patient not taking: Reported on 03/22/2018 02/02/18   Porfirio Oar, PA-C  alprazolam Prudy Feeler) 2 MG tablet Take 0.5-1 tablets (1-2 mg total) by mouth 2 (two) times daily as needed for sleep. Patient not taking: Reported on 03/22/2018 02/02/18   Porfirio Oar, PA-C  amphetamine-dextroamphetamine (ADDERALL) 20 MG tablet Take 1 tablet (20 mg total) by mouth 3 (three) times daily. Patient not taking: Reported on 03/22/2018 02/02/18   Porfirio Oar, PA-C  amphetamine-dextroamphetamine (ADDERALL) 20 MG tablet Take 1 tablet (20 mg total) by mouth 3 (three) times daily. Patient not taking: Reported on 03/22/2018 02/02/18   Porfirio Oar, PA-C  ciprofloxacin-hydrocortisone (CIPRO HC) otic suspension Place 3 drops into the right ear 2 (two) times daily. Patient not taking: Reported on 03/22/2018 07/06/15   Morrell Riddle, PA-C       Allergies  Allergen Reactions  . Imitrex [Sumatriptan Base] Shortness Of Breath and Other (See Comments)    TACHYCARDIA  . Trazodone And Nefazodone Other (See Comments)    NIGHTMARES  . Wellbutrin [Bupropion] Other (See Comments)    "crawling out of my skin"  . Effexor [Venlafaxine] Other (See Comments)    MAKES HER "JITTERY"  . Metoclopramide Hcl Itching  . Morphine And Related Itching  . Tegretol [Carbamazepine] Itching  . Topamax Itching       Objective:  Physical Exam  Constitutional: She is oriented to person,  place, and time. She appears well-developed and well-nourished. She is active and cooperative. No distress.  BP 108/72   Pulse (!) 112   Temp 99 F (37.2 C)   Resp 16   Ht 5\' 6"  (1.676 m)   Wt 193 lb 12.8 oz (87.9 kg)   SpO2 97%   BMI 31.28 kg/m   HENT:  Head: Normocephalic and atraumatic.  Right Ear: Hearing normal.  Left Ear: Hearing normal.  Eyes: Conjunctivae are normal. No scleral icterus.  Neck: Normal range of motion. Neck supple. No thyromegaly present.  Cardiovascular: Normal rate, regular rhythm and normal heart sounds.  Pulses:      Radial pulses are 2+ on the right side, and 2+ on the left side.  Pulmonary/Chest: Effort normal and breath sounds normal.  Musculoskeletal:       Cervical back: She exhibits tenderness, bony tenderness and pain. She exhibits normal range of motion, no swelling, no edema, no deformity, no laceration, no spasm and normal pulse.       Thoracic back: Normal.       Lumbar back: She exhibits tenderness, bony tenderness and pain. She exhibits normal range of motion, no swelling, no edema, no deformity, no laceration, no spasm  and normal pulse.  Lymphadenopathy:       Head (right side): No tonsillar, no preauricular, no posterior auricular and no occipital adenopathy present.       Head (left side): No tonsillar, no preauricular, no posterior auricular and no occipital adenopathy present.    She has no cervical adenopathy.       Right: No supraclavicular adenopathy present.       Left: No supraclavicular adenopathy present.  Neurological: She is alert and oriented to person, place, and time. No sensory deficit.  Skin: Skin is warm, dry and intact. No rash noted. No cyanosis or erythema. Nails show no clubbing.  Psychiatric: She has a normal mood and affect. Her speech is normal and behavior is normal.    Wt Readings from Last 3 Encounters:  03/22/18 193 lb 12.8 oz (87.9 kg)  12/20/17 202 lb 6.4 oz (91.8 kg)  11/13/17 203 lb (92.1 kg)         Assessment & Plan:   Problem List Items Addressed This Visit    Depression (Chronic)    Stable.  Continue current treatment.      Relevant Medications   alprazolam (XANAX) 2 MG tablet   alprazolam (XANAX) 2 MG tablet   alprazolam (XANAX) 2 MG tablet   sertraline (ZOLOFT) 100 MG tablet   ARIPiprazole (ABILIFY) 10 MG tablet   Lumbar back pain (Chronic)    Stable.  Continue current treatment.      Relevant Medications   oxyCODONE ER (XTAMPZA ER) 27 MG C12A (Start on 05/21/2018)   oxyCODONE ER (XTAMPZA ER) 27 MG C12A   oxyCODONE ER (XTAMPZA ER) 27 MG C12A (Start on 04/21/2018)   ADD (attention deficit disorder) (Chronic)    Stable.  Continue current treatment.      Relevant Medications   amphetamine-dextroamphetamine (ADDERALL) 20 MG tablet   amphetamine-dextroamphetamine (ADDERALL) 20 MG tablet   amphetamine-dextroamphetamine (ADDERALL) 20 MG tablet   Hypertension - Primary    Well-controlled.  Continue amlodipine 10 mg daily.  Continue healthy lifestyle changes.      Relevant Medications   amLODipine (NORVASC) 10 MG tablet   Esophageal stricture   Relevant Medications   pantoprazole (PROTONIX) 40 MG tablet   Chronic pain disorder    Stable.  Continue current treatment.  Next visit plan to update urine drug screen and controlled substance contract.      Relevant Medications   oxyCODONE ER (XTAMPZA ER) 27 MG C12A (Start on 05/21/2018)   Cervical radiculopathy    Stable.  Continue current treatment.      Relevant Medications   alprazolam (XANAX) 2 MG tablet   alprazolam (XANAX) 2 MG tablet   alprazolam (XANAX) 2 MG tablet   amphetamine-dextroamphetamine (ADDERALL) 20 MG tablet   amphetamine-dextroamphetamine (ADDERALL) 20 MG tablet   amphetamine-dextroamphetamine (ADDERALL) 20 MG tablet   sertraline (ZOLOFT) 100 MG tablet   ARIPiprazole (ABILIFY) 10 MG tablet   Anxiety    Stable.  Continue current treatment.      Relevant Medications   alprazolam (XANAX) 2 MG  tablet   alprazolam (XANAX) 2 MG tablet   alprazolam (XANAX) 2 MG tablet   sertraline (ZOLOFT) 100 MG tablet   ARIPiprazole (ABILIFY) 10 MG tablet       Return in about 3 months (around 06/22/2018) for re-evaluation of blood pressure, mood, pain, attention.   [name], PA-C [facility]

## 2018-03-23 NOTE — Assessment & Plan Note (Signed)
Stable.  Continue current treatment

## 2018-03-23 NOTE — Assessment & Plan Note (Signed)
Stable.  Continue current treatment.  Next visit plan to update urine drug screen and controlled substance contract.

## 2018-03-23 NOTE — Assessment & Plan Note (Signed)
Well-controlled.  Continue amlodipine 10 mg daily.  Continue healthy lifestyle changes.

## 2018-03-27 ENCOUNTER — Encounter: Payer: Self-pay | Admitting: Physician Assistant

## 2018-03-27 ENCOUNTER — Other Ambulatory Visit: Payer: Self-pay | Admitting: Physician Assistant

## 2018-03-27 DIAGNOSIS — E781 Pure hyperglyceridemia: Secondary | ICD-10-CM

## 2018-03-27 MED ORDER — ATORVASTATIN CALCIUM 20 MG PO TABS: 20.0000 mg | ORAL_TABLET | Freq: Every day | ORAL | 3 refills | Status: AC

## 2018-03-27 MED ORDER — ATORVASTATIN CALCIUM 20 MG PO TABS: 20.0000 mg | ORAL_TABLET | Freq: Every day | ORAL | 0 refills | Status: DC

## 2018-04-24 ENCOUNTER — Other Ambulatory Visit: Payer: Self-pay | Admitting: Physician Assistant

## 2018-04-24 DIAGNOSIS — F329 Major depressive disorder, single episode, unspecified: Secondary | ICD-10-CM

## 2018-04-24 DIAGNOSIS — F419 Anxiety disorder, unspecified: Secondary | ICD-10-CM

## 2018-04-24 DIAGNOSIS — F32A Depression, unspecified: Secondary | ICD-10-CM

## 2018-04-30 ENCOUNTER — Other Ambulatory Visit: Payer: Self-pay | Admitting: Physician Assistant

## 2018-04-30 DIAGNOSIS — F419 Anxiety disorder, unspecified: Secondary | ICD-10-CM

## 2018-04-30 DIAGNOSIS — F988 Other specified behavioral and emotional disorders with onset usually occurring in childhood and adolescence: Secondary | ICD-10-CM

## 2018-06-10 ENCOUNTER — Encounter: Payer: Self-pay | Admitting: Gastroenterology

## 2018-09-09 ENCOUNTER — Ambulatory Visit: Payer: Self-pay | Admitting: Gastroenterology

## 2018-10-09 ENCOUNTER — Ambulatory Visit (INDEPENDENT_AMBULATORY_CARE_PROVIDER_SITE_OTHER): Payer: PRIVATE HEALTH INSURANCE | Admitting: Gastroenterology

## 2018-10-09 ENCOUNTER — Encounter: Payer: Self-pay | Admitting: Gastroenterology

## 2018-10-09 VITALS — BP 120/84 | HR 92 | Ht 65.0 in | Wt 207.4 lb

## 2018-10-09 DIAGNOSIS — R1314 Dysphagia, pharyngoesophageal phase: Secondary | ICD-10-CM

## 2018-10-09 DIAGNOSIS — R0989 Other specified symptoms and signs involving the circulatory and respiratory systems: Secondary | ICD-10-CM

## 2018-10-09 DIAGNOSIS — R12 Heartburn: Secondary | ICD-10-CM

## 2018-10-09 NOTE — Patient Instructions (Signed)
If you are age 49 or older, your body mass index should be between 23-30. Your Body mass index is 34.51 kg/m. If this is out of the aforementioned range listed, please consider follow up with your Primary Care Provider.  If you are age 49 or younger, your body mass index should be between 19-25. Your Body mass index is 34.51 kg/m. If this is out of the aformentioned range listed, please consider follow up with your Primary Care Provider.   You have been scheduled for an endoscopy. Please follow written instructions given to you at your visit today. If you use inhalers (even only as needed), please bring them with you on the day of your procedure. Your physician has requested that you go to www.startemmi.com and enter the access code given to you at your visit today. This web site gives a general overview about your procedure. However, you should still follow specific instructions given to you by our office regarding your preparation for the procedure.  It was a pleasure to see you today!  Dr. Myrtie Neitheranis

## 2018-10-09 NOTE — Progress Notes (Signed)
[facility] Consult Note:  History: [name] 10/09/2018  Referring physician: Theora Gianotti, PA  Reason for consult/chief complaint: Dysphagia (x 3-4 months) and globus sensation   Subjective  HPI:  This is a very pleasant 47 year old woman referred by primary care for throat discomfort and dysphagia.  She saw Dr. Arlyce Dice in 2013 for constipation and again in 2015 for constipation and dysphagia.  She reports her bowel habits have since normalized.  She had an upper endoscopy in 2015 that was normal, 52 French bougie dilation performed.  She is not certain if her dysphagia improved afterwards.  She still has some dysphagia from time to time occurring with both solids and liquids, where it may feel briefly hung up in the neck.  For the last several months she has had a fairly constant feeling of a lump or fullness in the throat with no clear triggers or relieving factors.  He does not smoke cigarettes, but does vape. She denies nausea, vomiting, anorexia or weight change.  ROS:  Review of Systems  Constitutional: Negative for appetite change and unexpected weight change.  HENT: Negative for mouth sores and voice change.   Eyes: Negative for pain and redness.  Respiratory: Negative for cough and shortness of breath.   Cardiovascular: Negative for chest pain and palpitations.  Genitourinary: Negative for dysuria and hematuria.  Musculoskeletal: Negative for arthralgias and myalgias.  Skin: Negative for pallor and rash.  Neurological: Negative for weakness and headaches.  Hematological: Negative for adenopathy.  Psychiatric/Behavioral:       Anxiety     Past Medical History: Past Medical History:  Diagnosis Date  . Anxiety   . Arthritis    left knee  . Back pain   . Carpal tunnel syndrome   . Cervical muscle strain 07/2013   current Prednisone taper  . Cervical radiculopathy   . Chronic pain   . Complication of anesthesia 03/2011   history of  aspiration after anesthesia  . Dental crowns present    x 2 upper front  . Depression   . Drug-seeking behavior 08/16/2013  . Genital herpes simplex type 2   . GERD (gastroesophageal reflux disease)   . Herniated disc, cervical   . History of alcohol abuse    no alcohol since 2004  . History of cocaine abuse (HCC)    none since 2011, entered treatment  . History of esophageal dilatation    multiple  . History of kidney stones   . History of pancreatitis 2012  . Hyperlipidemia   . Hypertension    under control with med., has been on med. x 3 yr.  . IBS (irritable bowel syndrome)   . Medial meniscus tear 07/2013   right  . Migraine headache   . Sleep apnea    not wearing c-pap  . Substance abuse [facility])      Past Surgical History: Past Surgical History:  Procedure Laterality Date  . ABDOMINAL HYSTERECTOMY     partial  . APPENDECTOMY    . CARPAL TUNNEL RELEASE  07/29/2012   Procedure: CARPAL TUNNEL RELEASE;  Surgeon: Eldred Manges, MD;  Location:  SURGERY CENTER;  Service: Orthopedics;  Laterality: Right;  Right carpal tunnel release  . CESAREAN SECTION     x 2  . CHOLECYSTECTOMY  04/05/2010  . COLONOSCOPY    . CYSTOSCOPY W/ URETERAL STENT PLACEMENT Right 06/11/2007   stent removed  . ERCP    . ERCP W/ METAL STENT PLACEMENT  03/14/2011  . KNEE ARTHROSCOPY Left    x 3  . KNEE ARTHROSCOPY WITH MEDIAL MENISECTOMY Right 07/31/2013   Procedure: KNEE ARTHROSCOPY WITH PARTIAL LATERAL MENISECTOMY, CHONDROPLASTY;  Surgeon: Loreta Aveaniel F Murphy, MD;  Location: [facility];  Service: Orthopedics;  Laterality: Right;  . LAPAROSCOPIC APPENDECTOMY  02/21/2006  . LAPAROSCOPIC LYSIS OF ADHESIONS  02/21/2006  . NASAL SEPTOPLASTY W/ TURBINOPLASTY Bilateral 07/26/2009   bilat. turb. reduction  . NASAL SEPTUM SURGERY    . UPPER GASTROINTESTINAL ENDOSCOPY     with dilatation     Family History: Family History  Problem Relation Age of Onset  . Heart disease Maternal  Grandmother   . Kidney cancer Mother   . Hypertension Mother   . Cancer Mother        renal  . Alcohol abuse Mother   . Stroke Maternal Uncle 55  . Alcohol abuse Maternal Uncle   . Heart disease Maternal Uncle   . Lung cancer Maternal Aunt   . Colon cancer Neg Hx   . Prostate cancer Neg Hx   . Esophageal cancer Neg Hx   . Pancreatic cancer Neg Hx   . Rectal cancer Neg Hx   . Stomach cancer Neg Hx     Social History: Social History   Socioeconomic History  . Marital status: Significant Other    Spouse name: Dustin Flocknnette Andrews  . Number of children: 2  . Years of education: Not on file  . Highest education level: Not on file  Occupational History  . 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 Financial resource strain: Not on file  . Food insecurity:    Worry: Not on file    Inability: Not on file  . Transportation needs:    Medical: Not on file    Non-medical: Not on file  Tobacco Use  . Smoking status: Former Smoker    Last attempt to quit: 06/05/2013    Years since quitting: 5.3  . Smokeless tobacco: Never Used  Substance and Sexual Activity  . Alcohol use: No    Alcohol/week: 0.0 standard drinks    Comment: none since 2004  . Drug use: No    Comment: none since 2011  . Sexual activity: Yes    Partners: Female  Lifestyle  . Physical activity:    Days per week: Not on file    Minutes per session: Not on file  . Stress: Not on file  Relationships  . Social connections:    Talks on phone: Not on file    Gets together: Not on file    Attends religious service: Not on file    Active member of club or organization: Not on file    Attends meetings of clubs or organizations: Not on file    Relationship status: Not on file  Other Topics Concern  . Not on file  Social History Narrative   3 caffeine drinks daily    Lives with her wife and one daughter.   Her other child lives with a relative nearby.      Was recruited to  [employer], with a full scholarship, for undergraduate studies, but had to decline due to family problems.  She then planned to attend [employer] ([employer]) but wasn't able to work and attend courses, so went to [employer] and studied Production designer, theatre/television/filmCommercial Arts.      Has dropped out of the substance abuse counseling program  she was enrolled in, having figured out that it's not a career path she wants to continue.  Was also previously employed as a Lawyer.               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 Carbamazepine Itching    Other reaction(s): NAUSEA   . Imitrex [Sumatriptan Base] Shortness Of Breath and Other (See Comments)    TACHYCARDIA  . Trazodone And Nefazodone Other (See Comments)    NIGHTMARES  . Bupropion Other (See Comments)    "crawling out of my skin" Other reaction(s): Other (See Comments) "crawling out of my skin"  . Buprenorphine Hcl Itching  . Metoclopramide Hcl Itching  . Morphine And Related Itching  . Topamax Itching  . Venlafaxine Other (See Comments)    MAKES HER "JITTERY" Other reaction(s): Other (See Comments) MAKES HER "JITTERY"    Outpatient Meds: Current Outpatient Medications  Medication Sig Dispense Refill  . acetaminophen (TYLENOL) 500 MG tablet Take 1,000 mg by mouth every 6 (six) hours as needed for moderate pain.    Marland Kitchen alprazolam (XANAX) 2 MG tablet Take 0.5-1 tablets (1-2 mg total) by mouth 2 (two) times daily as needed for sleep. 60 tablet 0  . amLODipine (NORVASC) 10 MG tablet TAKE 1 TABLET BY MOUTH AT BEDTIME. 90 tablet 3  . amphetamine-dextroamphetamine (ADDERALL) 20 MG tablet Take 1 tablet (20 mg total) by mouth 3 (three) times daily. 90 tablet 0  . ARIPiprazole (ABILIFY) 10 MG tablet Take 1 tablet (10 mg total) by mouth daily. 90 tablet 3  . atorvastatin (LIPITOR) 20 MG tablet Take 1 tablet (20 mg total) by mouth daily. 90 tablet 3  . ibuprofen (ADVIL,MOTRIN) 200 MG tablet  Take 600 mg by mouth 2 (two) times daily as needed for pain.    . NON FORMULARY Massage Therapy 60 minutes every 2-4 weeks Dx: anxiety, depression, chronic pain of the lumbar and cervical spine 1 Units PRN  . ondansetron (ZOFRAN-ODT) 8 MG disintegrating tablet DISSOLVE 1 TABLET BY MOUTH EVERY 8 HOURS AS NEEDED FOR NAUSEA 90 tablet 1  . pantoprazole (PROTONIX) 40 MG tablet Take 1 tablet (40 mg total) by mouth daily. 180 tablet 3  . sertraline (ZOLOFT) 100 MG tablet Take 1 tablet (100 mg total) by mouth daily. 90 tablet 3  . valACYclovir (VALTREX) 1000 MG tablet Take 1 tablet (1,000 mg total) by mouth daily. 90 tablet 3   No current facility-administered medications for this visit.       ___________________________________________________________________ Objective   Exam:  BP 120/84 (BP Location: Left Arm, Patient Position: Sitting, Cuff Size: Normal)   Pulse 92   Ht 5\' 5"  (1.651 m) Comment: height measured without shoes  Wt 207 lb 6 oz (94.1 kg)   BMI 34.51 kg/m    General: this is a(n) well-appearing woman with normal vocal quality  Eyes: sclera anicteric, no redness  ENT: oral mucosa moist without lesions, no cervical or supraclavicular lymphadenopathy, good dentition  CV: RRR without murmur, S1/S2, no JVD, no peripheral edema  Resp: clear to auscultation bilaterally, normal RR and effort noted  GI: soft, no tenderness, with active bowel sounds. No guarding or palpable organomegaly noted.  Skin; warm and dry, no rash or jaundice noted  Neuro: awake, alert and oriented x 3. Normal gross motor function and fluent speech  No recent data for review  Assessment: Encounter Diagnoses  Name Primary?  . Pharyngoesophageal dysphagia  Yes  . Globus sensation   . Heartburn    Globus sensation with dysphagia, difficult to tell if laryngeal (possibly related to vaping) or condition in upper esophagus. While the Abilify class of medicines can cause esophageal dysmotility, that  does not seem likely in his case given the description of symptoms and length of time she has been on that medicine.  She has years of chronic heartburn that are under control if she takes once daily Protonix.  Plan: Upper endoscopy possible dilation.  She is agreeable after discussion of procedure and risks.  The benefits and risks of the planned procedure were described in detail with the patient or (when appropriate) their health care proxy.  Risks were outlined as including, but not limited to, bleeding, infection, perforation, adverse medication reaction leading to cardiac or pulmonary decompensation, or pancreatitis (if ERCP).  The limitation of incomplete mucosal visualization was also discussed.  No guarantees or warranties were given.   Thank you for the courtesy of this consult.  Please call me with any questions or concerns.  Charlie Pitter III  CC: Referring provider noted above

## 2018-11-13 ENCOUNTER — Encounter: Payer: Self-pay | Admitting: Gastroenterology

## 2018-11-13 ENCOUNTER — Ambulatory Visit (AMBULATORY_SURGERY_CENTER): Payer: PRIVATE HEALTH INSURANCE | Admitting: Gastroenterology

## 2018-11-13 VITALS — BP 117/76 | HR 90 | Temp 99.3°F | Resp 17 | Ht 65.0 in | Wt 207.0 lb

## 2018-11-13 DIAGNOSIS — R1314 Dysphagia, pharyngoesophageal phase: Secondary | ICD-10-CM | POA: Diagnosis not present

## 2018-11-13 MED ORDER — SODIUM CHLORIDE 0.9 % IV SOLN: 500.0000 mL | Freq: Once | INTRAVENOUS | Status: DC

## 2018-11-13 NOTE — Progress Notes (Signed)
A and O x3. Report to RN. Tolerated MAC anesthesia well.Teeth unchanged after procedure.

## 2018-11-13 NOTE — Patient Instructions (Signed)
Discontinue any products containing nicotine   YOU HAD AN ENDOSCOPIC PROCEDURE TODAY AT THE [facility]:   Refer to the procedure report that was given to you for any specific questions about what was found during the examination.  If the procedure report does not answer your questions, please call your gastroenterologist to clarify.  If you requested that your care partner not be given the details of your procedure findings, then the procedure report has been included in a sealed envelope for you to review at your convenience later.  YOU SHOULD EXPECT: Some feelings of bloating in the abdomen. Passage of more gas than usual.  Walking can help get rid of the air that was put into your GI tract during the procedure and reduce the bloating. If you had a lower endoscopy (such as a colonoscopy or flexible sigmoidoscopy) you may notice spotting of blood in your stool or on the toilet paper. If you underwent a bowel prep for your procedure, you may not have a normal bowel movement for a few days.  Please Note:  You might notice some irritation and congestion in your nose or some drainage.  This is from the oxygen used during your procedure.  There is no need for concern and it should clear up in a day or so.  SYMPTOMS TO REPORT IMMEDIATELY:    Following upper endoscopy (EGD)  Vomiting of blood or coffee ground material  New chest pain or pain under the shoulder blades  Painful or persistently difficult swallowing  New shortness of breath  Fever of 100F or higher  Black, tarry-looking stools  For urgent or emergent issues, a gastroenterologist can be reached at any hour by calling [phone].   DIET:  We do recommend a small meal at first, but then you may proceed to your regular diet.  Drink plenty of fluids but you should avoid alcoholic beverages for 24 hours.  ACTIVITY:  You should plan to take it easy for the rest of today and you should NOT DRIVE or use heavy machinery  until tomorrow (because of the sedation medicines used during the test).    FOLLOW UP: Our staff will call the number listed on your records the next business day following your procedure to check on you and address any questions or concerns that you may have regarding the information given to you following your procedure. If we do not reach you, we will leave a message.  However, if you are feeling well and you are not experiencing any problems, there is no need to return our call.  We will assume that you have returned to your regular daily activities without incident.  If any biopsies were taken you will be contacted by phone or by letter within the next 1-3 weeks.  Please call us at [phone] if you have not heard about the biopsies in 3 weeks.    SIGNATURES/CONFIDENTIALITY: You and/or your care partner have signed paperwork which will be entered into your electronic medical record.  These signatures attest to the fact that that the information above on your After Visit Summary has been reviewed and is understood.  Full responsibility of the confidentiality of this discharge information lies with you and/or your care-partner.

## 2018-11-13 NOTE — Op Note (Signed)
[facility] Patient Name: [name] Procedure Date: 11/13/2018 2:26 PM MRN: [id number] Endoscopist: Sherilyn Cooter L. Myrtie Neither , MD Age: 48 Referring MD:  Date of Birth: 07-17-1971 Gender: Female Account #: [id number] Procedure:                Upper GI endoscopy Indications:              Pharyngeal phase dysphagia, Globus sensation Medicines:                Monitored Anesthesia Care Procedure:                Pre-Anesthesia Assessment:                           - Prior to the procedure, a History and Physical                            was performed, and patient medications and                            allergies were reviewed. The patient's tolerance of                            previous anesthesia was also reviewed. The risks                            and benefits of the procedure and the sedation                            options and risks were discussed with the patient.                            All questions were answered, and informed consent                            was obtained. Prior Anticoagulants: The patient has                            taken no previous anticoagulant or antiplatelet                            agents. ASA Grade Assessment: II - A patient with                            mild systemic disease. After reviewing the risks                            and benefits, the patient was deemed in                            satisfactory condition to undergo the procedure.                           After obtaining informed consent, the endoscope was  passed under direct vision. Throughout the                            procedure, the patient's blood pressure, pulse, and                            oxygen saturations were monitored continuously. The                            Endoscope was introduced through the mouth, and                            advanced to the second part of duodenum. The upper                            GI endoscopy was  accomplished without difficulty.                            The patient tolerated the procedure well. Scope In: Scope Out: Findings:                 The larynx was normal.                           The esophagus was normal.                           The stomach was normal.                           The cardia and gastric fundus were normal on                            retroflexion.                           The examined duodenum was normal. Complications:            No immediate complications. Estimated Blood Loss:     Estimated blood loss: none. Impression:               - Normal larynx.                           - Normal esophagus.                           - Normal stomach.                           - Normal examined duodenum.                           - No specimens collected. Recommendation:           - Patient has a contact number available for                            emergencies. The signs and  symptoms of potential                            delayed complications were discussed with the                            patient. Return to normal activities tomorrow.                            Written discharge instructions were provided to the                            patient.                           - Resume previous diet.                           - Continue present medications.                           - Discontinue the use of any products containing                            nicotine (i.e. vaping) Henry L. Myrtie Neither, MD 11/13/2018 2:55:37 PM This report has been signed electronically.

## 2018-11-14 ENCOUNTER — Telehealth: Payer: Self-pay | Admitting: *Deleted

## 2018-11-14 NOTE — Telephone Encounter (Signed)
  Follow up Call-  Call back number 11/13/2018  Post procedure Call Back phone  # [phone]  Permission to leave phone message Yes  Some recent data might be hidden     Patient questions:  Do you have a fever, pain , or abdominal swelling? No. Pain Score  0 *  Have you tolerated food without any problems? Yes.    Have you been able to return to your normal activities? Yes.    Do you have any questions about your discharge instructions: Diet   No. Medications  No. Follow up visit  No.  Do you have questions or concerns about your Care? No.  Actions: * If pain score is 4 or above: No action needed, pain <4.

## 2021-05-16 ENCOUNTER — Other Ambulatory Visit: Payer: Self-pay

## 2021-05-16 ENCOUNTER — Emergency Department (HOSPITAL_BASED_OUTPATIENT_CLINIC_OR_DEPARTMENT_OTHER): Payer: No Typology Code available for payment source

## 2021-05-16 ENCOUNTER — Encounter (HOSPITAL_BASED_OUTPATIENT_CLINIC_OR_DEPARTMENT_OTHER): Payer: Self-pay

## 2021-05-16 ENCOUNTER — Emergency Department (HOSPITAL_BASED_OUTPATIENT_CLINIC_OR_DEPARTMENT_OTHER)
Admission: EM | Admit: 2021-05-16 | Discharge: 2021-05-16 | Disposition: A | Discharge status: Home / Self Care | Payer: No Typology Code available for payment source | Attending: Emergency Medicine | Admitting: Emergency Medicine

## 2021-05-16 DIAGNOSIS — S62002A Unspecified fracture of navicular [scaphoid] bone of left wrist, initial encounter for closed fracture: Secondary | ICD-10-CM

## 2021-05-16 DIAGNOSIS — I10 Essential (primary) hypertension: Secondary | ICD-10-CM | POA: Insufficient documentation

## 2021-05-16 DIAGNOSIS — S52502A Unspecified fracture of the lower end of left radius, initial encounter for closed fracture: Secondary | ICD-10-CM

## 2021-05-16 DIAGNOSIS — Y9351 Activity, roller skating (inline) and skateboarding: Secondary | ICD-10-CM | POA: Insufficient documentation

## 2021-05-16 DIAGNOSIS — S6992XA Unspecified injury of left wrist, hand and finger(s), initial encounter: Secondary | ICD-10-CM | POA: Diagnosis present

## 2021-05-16 DIAGNOSIS — S62015A Nondisplaced fracture of distal pole of navicular [scaphoid] bone of left wrist, initial encounter for closed fracture: Secondary | ICD-10-CM | POA: Insufficient documentation

## 2021-05-16 DIAGNOSIS — Z87891 Personal history of nicotine dependence: Secondary | ICD-10-CM | POA: Insufficient documentation

## 2021-05-16 NOTE — ED Provider Notes (Signed)
[facility] EMERGENCY DEPARTMENT Provider Note   CSN: [id number] Arrival date & time: 05/16/21  1916     History Chief Complaint  Patient presents with   Wrist Injury    [name] is a 50 y.o. female.  Pt fell while roller skating and fell.    The history is provided by the patient. No language interpreter was used.  Wrist Injury Location:  Wrist Wrist location:  L wrist Injury: no   Pain details:    Quality:  Aching   Radiates to:  Does not radiate   Severity:  No pain   Onset quality:  Gradual   Duration:  4 hours   Timing:  Constant   Progression:  Worsening Dislocation: no   Foreign body present:  No foreign bodies Relieved by:  Nothing Worsened by:  Nothing Ineffective treatments:  None tried Associated symptoms: swelling       Past Medical History:  Diagnosis Date   Anxiety    Arthritis    left knee   Back pain    Carpal tunnel syndrome    Cervical muscle strain 07/2013   current Prednisone taper   Cervical radiculopathy    Chronic pain    Complication of anesthesia 03/2011   history of aspiration after anesthesia   Dental crowns present    x 2 upper front   Depression    Drug-seeking behavior 08/16/2013   Genital herpes simplex type 2    GERD (gastroesophageal reflux disease)    Herniated disc, cervical    History of alcohol abuse    no alcohol since 2004   History of cocaine abuse (HCC)    none since 2011, entered treatment   History of esophageal dilatation    multiple   History of kidney stones    History of pancreatitis 2012   Hyperlipidemia    Hypertension    under control with med., has been on med. x 3 yr.   IBS (irritable bowel syndrome)    Medial meniscus tear 07/2013   right   Migraine headache    Sleep apnea    not wearing c-pap   Substance abuse [facility])     Patient Active Problem List   Diagnosis Date Noted   HSV-2 infection 06/16/2015   IBS (irritable bowel syndrome) 06/03/2014   Anxiety 03/09/2014    Obesity (BMI 30-39.9) 10/27/2013   Neck pain on left side 09/02/2013   Cervical radiculopathy 08/18/2013   Chronic pain disorder 08/16/2013   Lumbar back pain 11/21/2012   Esophageal stricture 11/21/2012   ADD (attention deficit disorder) 11/21/2012   Hypertriglyceridemia 11/21/2012   Carpal tunnel syndrome, bilateral 07/05/2012   OSA (obstructive sleep apnea) 06/28/2012   Constipation 01/01/2012   Migraine headache 03/02/2011   Depression 03/02/2011   History of substance abuse (HCC) 03/02/2011   Hypertension 03/02/2011    Past Surgical History:  Procedure Laterality Date   ABDOMINAL HYSTERECTOMY     partial   APPENDECTOMY     CARPAL TUNNEL RELEASE  07/29/2012   Procedure: CARPAL TUNNEL RELEASE;  Surgeon: Eldred Manges, MD;  Location: [facility];  Service: Orthopedics;  Laterality: Right;  Right carpal tunnel release   CESAREAN SECTION     x 2   CHOLECYSTECTOMY  04/05/2010   COLONOSCOPY     CYSTOSCOPY W/ URETERAL STENT PLACEMENT Right 06/11/2007   stent removed   ERCP     ERCP W/ METAL STENT PLACEMENT  03/14/2011   KNEE ARTHROSCOPY Left  x 3   KNEE ARTHROSCOPY WITH MEDIAL MENISECTOMY Right 07/31/2013   Procedure: KNEE ARTHROSCOPY WITH PARTIAL LATERAL MENISECTOMY, CHONDROPLASTY;  Surgeon: Loreta Ave, MD;  Location: [facility];  Service: Orthopedics;  Laterality: Right;   LAPAROSCOPIC APPENDECTOMY  02/21/2006   LAPAROSCOPIC LYSIS OF ADHESIONS  02/21/2006   NASAL SEPTOPLASTY W/ TURBINOPLASTY Bilateral 07/26/2009   bilat. turb. reduction   NASAL SEPTUM SURGERY     UPPER GASTROINTESTINAL ENDOSCOPY     with dilatation     OB History   No obstetric history on file.     Family History  Problem Relation Age of Onset   Heart disease Maternal Grandmother    Kidney cancer Mother    Hypertension Mother    Cancer Mother        renal   Alcohol abuse Mother    Stroke Maternal Uncle 102   Alcohol abuse Maternal Uncle    Heart disease Maternal Uncle     Lung cancer Maternal Aunt    Colon cancer Neg Hx    Prostate cancer Neg Hx    Esophageal cancer Neg Hx    Pancreatic cancer Neg Hx    Rectal cancer Neg Hx    Stomach cancer Neg Hx     Social History   Tobacco Use   Smoking status: Former    Pack years: 0.00    Types: Cigarettes    Quit date: 06/05/2013    Years since quitting: 7.9   Smokeless tobacco: Never  Vaping Use   Vaping Use: Every day   Start date: 11/13/2013   Substances: Nicotine, Flavoring   Devices: smoke pen 22  Substance Use Topics   Alcohol use: No    Alcohol/week: 0.0 standard drinks    Comment: none since 2004   Drug use: Not Currently    Comment: none since 2011    Home Medications Prior to Admission medications   Medication Sig Start Date End Date Taking? Authorizing Provider  acetaminophen (TYLENOL) 500 MG tablet Take 1,000 mg by mouth every 6 (six) hours as needed for moderate pain.    [provider]  alprazolam Prudy Feeler) 2 MG tablet Take 0.5-1 tablets (1-2 mg total) by mouth 2 (two) times daily as needed for sleep. 03/22/18   Porfirio Oar, PA  amLODipine (NORVASC) 10 MG tablet TAKE 1 TABLET BY MOUTH AT BEDTIME. 03/22/18   Porfirio Oar, PA  amphetamine-dextroamphetamine (ADDERALL) 20 MG tablet Take 1 tablet (20 mg total) by mouth 3 (three) times daily. 03/22/18   Porfirio Oar, PA  ARIPiprazole (ABILIFY) 10 MG tablet Take 1 tablet (10 mg total) by mouth daily. 03/22/18   Porfirio Oar, PA  atorvastatin (LIPITOR) 20 MG tablet Take 1 tablet (20 mg total) by mouth daily. 03/27/18   Porfirio Oar, PA  ibuprofen (ADVIL,MOTRIN) 200 MG tablet Take 600 mg by mouth 2 (two) times daily as needed for pain.    [provider]  NON FORMULARY Massage Therapy 60 minutes every 2-4 weeks Dx: anxiety, depression, chronic pain of the lumbar and cervical spine 12/25/17   Porfirio Oar, PA  ondansetron (ZOFRAN-ODT) 8 MG disintegrating tablet DISSOLVE 1 TABLET BY MOUTH EVERY 8 HOURS AS NEEDED FOR  NAUSEA 01/30/18   Jeffery, [name], PA  pantoprazole (PROTONIX) 40 MG tablet Take 1 tablet (40 mg total) by mouth daily. 03/22/18   Porfirio Oar, PA  sertraline (ZOLOFT) 100 MG tablet Take 1 tablet (100 mg total) by mouth daily. 03/22/18   Porfirio Oar, PA  valACYclovir (  VALTREX) 1000 MG tablet Take 1 tablet (1,000 mg total) by mouth daily. 06/16/15   Weber, Dema Severin, PA-C    Allergies    Carbamazepine, Imitrex [sumatriptan base], Trazodone and nefazodone, Bupropion, Buprenorphine hcl, Metoclopramide hcl, Morphine and related, Topamax, and Venlafaxine  Review of Systems   Review of Systems  All other systems reviewed and are negative.  Physical Exam Updated Vital Signs Pulse (!) 103   Temp 98.2 F (36.8 C) (Oral)   Resp 20   Ht 5\' 6"  (1.676 m)   Wt 95.3 kg   SpO2 100%   BMI 33.89 kg/m   Physical Exam Vitals reviewed.  Cardiovascular:     Rate and Rhythm: Normal rate.  Pulmonary:     Effort: Pulmonary effort is normal.  Musculoskeletal:        General: Swelling and tenderness present.     Comments: Swollen tender left wrist, pain with movement, nv and ns intact   Skin:    General: Skin is warm.  Neurological:     General: No focal deficit present.     Mental Status: She is alert.  Psychiatric:        Mood and Affect: Mood normal.    ED Results / Procedures / Treatments   Labs (all labs ordered are listed, but only abnormal results are displayed) Labs Reviewed - No data to display  EKG None  Radiology No results found.  Procedures Procedures   Medications Ordered in ED Medications - No data to display  ED Course  I have reviewed the triage vital signs and the nursing notes.  Pertinent labs & imaging results that were available during my care of the patient were reviewed by me and considered in my medical decision making (see chart for details).    MDM Rules/Calculators/A&P                          MDM:  xray shows radius and scaphoid fracture  Pt  placed in a splint.   Final Clinical Impression(s) / ED Diagnoses Final diagnoses:  Closed fracture of distal end of left radius, unspecified fracture morphology, initial encounter  Closed nondisplaced fracture of scaphoid of left wrist, unspecified portion of scaphoid, initial encounter    Rx / DC Orders ED Discharge Orders     None     An After Visit Summary was printed and given to the patient.    05/16/21 2243    Palumbo, April, MD 05/17/21 [phone]

## 2021-05-16 NOTE — Discharge Instructions (Addendum)
Schedule to see the Orthopaedist for evaluation in 3-4 days

## 2021-05-16 NOTE — ED Triage Notes (Signed)
Pt was roller skating and fell on her L wrist. Pt heard a snap and has pain radiating down into her hand. Distal CMS intact.

## 2022-04-11 ENCOUNTER — Encounter: Payer: Self-pay | Admitting: Primary Care

## 2022-04-11 ENCOUNTER — Ambulatory Visit (INDEPENDENT_AMBULATORY_CARE_PROVIDER_SITE_OTHER): Payer: Self-pay | Admitting: Primary Care

## 2022-04-11 VITALS — BP 124/72 | HR 109 | Temp 98.2°F | Ht 66.0 in | Wt 203.0 lb

## 2022-04-11 DIAGNOSIS — G4733 Obstructive sleep apnea (adult) (pediatric): Secondary | ICD-10-CM

## 2022-04-11 DIAGNOSIS — G4719 Other hypersomnia: Secondary | ICD-10-CM

## 2022-04-11 NOTE — Assessment & Plan Note (Addendum)
-   Sleep study in 2010 showed mild OSA, AHI 11/HR. Never started on CPAP. Patient continues to have symptoms daytime sleepiness, restless sleep. Epworth 32. BMI 32. Concern patient could have obstructive sleep apnea, needs home sleep study to evaluate. Discussed risk of untreated sleep apnea including cardiac arrhythmias, pulm HTN, stroke, DM. We briefly reviewed treatment options. Encouraged patient to work on weight loss efforts and focus on side sleeping position/elevate head of bed. Advised against driving if experiencing excessive daytime sleepiness. Follow-up in 4-6 weeks to review sleep study results and discuss treatment options further

## 2022-04-11 NOTE — Progress Notes (Signed)
Reviewed and agree with assessment/plan.   Audrianna Driskill, MD [facility] 04/11/2022, 12:18 PM Pager:  [phone]  

## 2022-04-11 NOTE — Patient Instructions (Addendum)
Sleep apnea is defined as period of 10 seconds or longer when you stop breathing at night. This can happen multiple times a night. Dx sleep apnea is when this occurs more than 5 times an hour.    Mild OSA 5-15 apneic events an hour Moderate OSA 15-30 apneic events an hour Severe OSA > 30 apneic events an hour   Untreated sleep apnea puts you at higher risk for cardiac arrhythmias, pulmonary HTN, stroke and diabetes   Treatment options include weight loss, side sleeping position, oral appliance, CPAP therapy or referral to ENT for possible surgical options    Recommendations: Focus on side sleeping position Work on weight loss efforts  Do not drive if experiencing excessive daytime sleepiness of fatigue    Orders: Home sleep study re: loud snoring (ordered)   Follow-up: 6 week virtual visit with [facility] NP to review sleep study results and treatment if needed     Sleep Apnea Sleep apnea is a condition in which breathing pauses or becomes shallow during sleep. People with sleep apnea usually snore loudly. They may have times when they gasp and stop breathing for 10 seconds or more during sleep. This may happen many times during the night. Sleep apnea disrupts your sleep and keeps your body from getting the rest that it needs. This condition can increase your risk of certain health problems, including: Heart attack. Stroke. Obesity. Type 2 diabetes. Heart failure. Irregular heartbeat. High blood pressure. The goal of treatment is to help you breathe normally again. What are the causes?  The most common cause of sleep apnea is a collapsed or blocked airway. There are three kinds of sleep apnea: Obstructive sleep apnea. This kind is caused by a blocked or collapsed airway. Central sleep apnea. This kind happens when the part of the brain that controls breathing does not send the correct signals to the muscles that control breathing. Mixed sleep apnea. This is a combination of  obstructive and central sleep apnea. What increases the risk? You are more likely to develop this condition if you: Are overweight. Smoke. Have a smaller than normal airway. Are older. Are female. Drink alcohol. Take sedatives or tranquilizers. Have a family history of sleep apnea. Have a tongue or tonsils that are larger than normal. What are the signs or symptoms? Symptoms of this condition include: Trouble staying asleep. Loud snoring. Morning headaches. Waking up gasping. Dry mouth or sore throat in the morning. Daytime sleepiness and tiredness. If you have daytime fatigue because of sleep apnea, you may be more likely to have: Trouble concentrating. Forgetfulness. Irritability or mood swings. Personality changes. Feelings of depression. Sexual dysfunction. This may include loss of interest if you are female, or erectile dysfunction if you are female. How is this diagnosed? This condition may be diagnosed with: A medical history. A physical exam. A series of tests that are done while you are sleeping (sleep study). These tests are usually done in a sleep lab, but they may also be done at home. How is this treated? Treatment for this condition aims to restore normal breathing and to ease symptoms during sleep. It may involve managing health issues that can affect breathing, such as high blood pressure or obesity. Treatment may include: Sleeping on your side. Using a decongestant if you have nasal congestion. Avoiding the use of depressants, including alcohol, sedatives, and narcotics. Losing weight if you are overweight. Making changes to your diet. Quitting smoking. Using a device to open your airway while you  sleep, such as: An oral appliance. This is a custom-made mouthpiece that shifts your lower jaw forward. A continuous positive airway pressure (CPAP) device. This device blows air through a mask when you breathe out (exhale). A nasal expiratory positive airway pressure  (EPAP) device. This device has valves that you put into each nostril. A bi-level positive airway pressure (BIPAP) device. This device blows air through a mask when you breathe in (inhale) and breathe out (exhale). Having surgery if other treatments do not work. During surgery, excess tissue is removed to create a wider airway. Follow these instructions at home: Lifestyle Make any lifestyle changes that your health care provider recommends. Eat a healthy, well-balanced diet. Take steps to lose weight if you are overweight. Avoid using depressants, including alcohol, sedatives, and narcotics. Do not use any products that contain nicotine or tobacco. These products include cigarettes, chewing tobacco, and vaping devices, such as e-cigarettes. If you need help quitting, ask your health care provider. General instructions Take over-the-counter and prescription medicines only as told by your health care provider. If you were given a device to open your airway while you sleep, use it only as told by your health care provider. If you are having surgery, make sure to tell your health care provider you have sleep apnea. You may need to bring your device with you. Keep all follow-up visits. This is important. Contact a health care provider if: The device that you received to open your airway during sleep is uncomfortable or does not seem to be working. Your symptoms do not improve. Your symptoms get worse. Get help right away if: You develop: Chest pain. Shortness of breath. Discomfort in your back, arms, or stomach. You have: Trouble speaking. Weakness on one side of your body. Drooping in your face. These symptoms may represent a serious problem that is an emergency. Do not wait to see if the symptoms will go away. Get medical help right away. Call your local emergency services (911 in the [place] Do not drive yourself to the hospital. Summary Sleep apnea is a condition in which breathing pauses  or becomes shallow during sleep. The most common cause is a collapsed or blocked airway. The goal of treatment is to restore normal breathing and to ease symptoms during sleep. This information is not intended to replace advice given to you by your health care provider. Make sure you discuss any questions you have with your health care provider. Document Revised: 06/01/2021 Document Reviewed: 10/01/2020 Elsevier Patient Education  2023 [employer].

## 2022-04-11 NOTE — Progress Notes (Signed)
@Patient  ID: [name], female    DOB: 10/14/1971, 51 y.o.   MRN: [id number]  Chief Complaint  Patient presents with   Consult    Pt is here for sleep consult. Pt states had sleep study done 10 years ago. She states she is to be on cpap therapy but is not. Pt states that sleep study was done in [place] in connection with [place]. Pt states she does snore at night. Pt has been told she has episode of not breathing.     Referring provider: Tracey Harries, MD  HPI: 51 year old female, former smoker.  Past medical history significant for hypertension, migraine headache, OSA, ADD, depression, chronic pain disorder, history of substance abuse, obesity.   04/11/2022 Patient presents today for sleep consult.  Patient had a sleep study on 01/01/2009 at [facility] that showed mild sleep disordered breathing with AHI 11 (RDI 12 an hour).  CPAP recommended.  She was never started on CPAP treatment.  She continues to have symptoms of daytime sleepiness along with restless sleep.  Typical bedtime is between 9 and 10 PM.  It takes her 15 minutes to fall asleep.  She wakes up on average 3 times a night.  She starts her day at 7 AM.  Her weight has remained stable over the last 2 years.  She has never been on CPAP therapy.  Epworth score 17.  No symptoms of narcolepsy, cataplexy or sleepwalking  Sleep questionnaire Symptoms-  Daytime fatigue/excessive sleepiness, restless sleep, frequent nocturnal awakenings Prior sleep study- 2010 Bedtime- 9-10pm Time to fall asleep- 15 mins  Nocturnal awakenings- 3 times Out of bed/start of day- 7am Weight changes- Stable Do you operate heavy machinery- No Do you currently wear CPAP- No Do you current wear oxygen- No Epworth- 17   Allergies  Allergen Reactions   Carbamazepine Itching    Other reaction(s): NAUSEA    Imitrex [Sumatriptan Base] Shortness Of Breath and Other (See Comments)    TACHYCARDIA   Trazodone And Nefazodone Other (See  Comments)    NIGHTMARES   Bupropion Other (See Comments)    "crawling out of my skin" Other reaction(s): Other (See Comments) "crawling out of my skin"   Buprenorphine Hcl Itching   Metoclopramide Hcl Itching   Morphine And Related Itching   Topamax Itching   Venlafaxine Other (See Comments)    MAKES HER "JITTERY" Other reaction(s): Other (See Comments) MAKES HER "JITTERY"    Immunization History  Administered Date(s) Administered   Influenza Split 08/02/2012   Influenza,inj,Quad PF,6+ Mos 07/23/2013, 09/30/2014, 07/13/2015, 08/29/2016   Influenza-Unspecified 08/20/2017   PPD Test 07/16/2012, 03/05/2013   Tdap 10/27/2013    Past Medical History:  Diagnosis Date   Anxiety    Arthritis    left knee   Back pain    Carpal tunnel syndrome    Cervical muscle strain 07/2013   current Prednisone taper   Cervical radiculopathy    Chronic pain    Complication of anesthesia 03/2011   history of aspiration after anesthesia   Dental crowns present    x 2 upper front   Depression    Drug-seeking behavior 08/16/2013   Genital herpes simplex type 2    GERD (gastroesophageal reflux disease)    Herniated disc, cervical    History of alcohol abuse    no alcohol since 2004   History of cocaine abuse (HCC)    none since 2011, entered treatment   History of esophageal dilatation    multiple  History of kidney stones    History of pancreatitis 2012   Hyperlipidemia    Hypertension    under control with med., has been on med. x 3 yr.   IBS (irritable bowel syndrome)    Medial meniscus tear 07/2013   right   Migraine headache    Sleep apnea    not wearing c-pap   Substance abuse (HCC)     Tobacco History: Social History   Tobacco Use  Smoking Status Former   Types: Cigarettes   Quit date: 06/05/2013   Years since quitting: 8.8  Smokeless Tobacco Never   Counseling given: Not Answered   Outpatient Medications Prior to Visit  Medication Sig Dispense Refill    acetaminophen (TYLENOL) 500 MG tablet Take 1,000 mg by mouth every 6 (six) hours as needed for moderate pain.     alprazolam (XANAX) 2 MG tablet Take 0.5-1 tablets (1-2 mg total) by mouth 2 (two) times daily as needed for sleep. 60 tablet 0   amLODipine (NORVASC) 10 MG tablet TAKE 1 TABLET BY MOUTH AT BEDTIME. 90 tablet 3   amphetamine-dextroamphetamine (ADDERALL) 20 MG tablet Take 1 tablet (20 mg total) by mouth 3 (three) times daily. 90 tablet 0   ARIPiprazole (ABILIFY) 10 MG tablet Take 1 tablet (10 mg total) by mouth daily. 90 tablet 3   atorvastatin (LIPITOR) 20 MG tablet Take 1 tablet (20 mg total) by mouth daily. 90 tablet 3   ibuprofen (ADVIL,MOTRIN) 200 MG tablet Take 600 mg by mouth 2 (two) times daily as needed for pain.     NON FORMULARY Massage Therapy 60 minutes every 2-4 weeks Dx: anxiety, depression, chronic pain of the lumbar and cervical spine 1 Units PRN   ondansetron (ZOFRAN-ODT) 8 MG disintegrating tablet DISSOLVE 1 TABLET BY MOUTH EVERY 8 HOURS AS NEEDED FOR NAUSEA 90 tablet 1   oxyCODONE (OXYCONTIN) 60 MG 12 hr tablet Take by mouth.     pantoprazole (PROTONIX) 40 MG tablet Take 1 tablet (40 mg total) by mouth daily. 180 tablet 3   sertraline (ZOLOFT) 100 MG tablet Take 1 tablet (100 mg total) by mouth daily. 90 tablet 3   valACYclovir (VALTREX) 1000 MG tablet Take 1 tablet (1,000 mg total) by mouth daily. 90 tablet 3   No facility-administered medications prior to visit.   Review of Systems  Review of Systems  Constitutional:  Positive for fatigue.  HENT: Negative.    Respiratory: Negative.  Negative for cough, chest tightness, shortness of breath and wheezing.   Psychiatric/Behavioral:  Positive for sleep disturbance.     Physical Exam  BP 124/72 (BP Location: Left Arm, Patient Position: Sitting, Cuff Size: Normal)   Pulse (!) 109   Temp 98.2 F (36.8 C) (Oral)   Ht 5\' 6"  (1.676 m)   Wt 203 lb (92.1 kg)   SpO2 96%   BMI 32.77 kg/m  Physical  Exam Constitutional:      Appearance: Normal appearance.  HENT:     Head: Normocephalic and atraumatic.     Mouth/Throat:     Mouth: Mucous membranes are moist.     Pharynx: Oropharynx is clear.  Cardiovascular:     Rate and Rhythm: Normal rate and regular rhythm.  Musculoskeletal:        General: Normal range of motion.  Skin:    General: Skin is warm and dry.  Neurological:     General: No focal deficit present.     Mental Status: She is alert and oriented  to person, place, and time. Mental status is at baseline.  Psychiatric:        Mood and Affect: Mood normal.        Behavior: Behavior normal.        Thought Content: Thought content normal.        Judgment: Judgment normal.     Lab Results:  CBC    Component Value Date/Time   WBC 8.1 11/13/2017 1732   WBC 7.4 06/20/2016 1412   RBC 4.53 11/13/2017 1732   RBC 4.99 06/20/2016 1412   HGB 12.7 11/13/2017 1732   HCT 37.9 11/13/2017 1732   PLT 338 11/13/2017 1732   MCV 84 11/13/2017 1732   MCH 28.0 11/13/2017 1732   MCH 27.7 06/20/2016 1412   MCHC 33.5 11/13/2017 1732   MCHC 33.7 06/20/2016 1412   RDW 14.0 11/13/2017 1732   LYMPHSABS 3.5 (H) 11/13/2017 1732   MONOABS 370 06/20/2016 1412   EOSABS 0.3 11/13/2017 1732   BASOSABS 0.1 11/13/2017 1732    BMET    Component Value Date/Time   NA 141 11/13/2017 1732   K 4.0 11/13/2017 1732   CL 99 11/13/2017 1732   CO2 26 11/13/2017 1732   GLUCOSE 124 (H) 11/13/2017 1732   GLUCOSE 98 06/20/2016 1412   BUN 6 11/13/2017 1732   CREATININE 0.72 11/13/2017 1732   CREATININE 0.81 06/20/2016 1412   CALCIUM 9.2 11/13/2017 1732   GFRNONAA 99 11/13/2017 1732   GFRAA 115 11/13/2017 1732    BNP No results found for: BNP  ProBNP No results found for: PROBNP  Imaging: No results found.   Assessment & Plan:   OSA (obstructive sleep apnea) - Sleep study in 2010 showed mild OSA, AHI 11/HR. Never started on CPAP. Patient continues to have symptoms daytime sleepiness,  restless sleep. Epworth 32. BMI 32. Concern patient could have obstructive sleep apnea, needs home sleep study to evaluate. Discussed risk of untreated sleep apnea including cardiac arrhythmias, pulm HTN, stroke, DM. We briefly reviewed treatment options. Encouraged patient to work on weight loss efforts and focus on side sleeping position/elevate head of bed. Advised against driving if experiencing excessive daytime sleepiness. Follow-up in 4-6 weeks to review sleep study results and discuss treatment options further   Glenford Bayley, NP 04/11/2022

## 2022-04-21 ENCOUNTER — Encounter: Payer: Self-pay | Admitting: Primary Care
